# Patient Record
Sex: Female | Born: 1935 | Race: White | Hispanic: No | Smoking: Never smoker
Health system: Southern US, Community
[De-identification: ages and names within clinical notes are randomized; demographics above are authoritative.]

## PROBLEM LIST (undated history)

## (undated) DIAGNOSIS — I10 Essential (primary) hypertension: Secondary | ICD-10-CM

## (undated) DIAGNOSIS — G3184 Mild cognitive impairment, so stated: Secondary | ICD-10-CM

## (undated) DIAGNOSIS — I4819 Other persistent atrial fibrillation: Secondary | ICD-10-CM

## (undated) DIAGNOSIS — M549 Dorsalgia, unspecified: Secondary | ICD-10-CM

## (undated) DIAGNOSIS — I63239 Cerebral infarction due to unspecified occlusion or stenosis of unspecified carotid arteries: Secondary | ICD-10-CM

## (undated) DIAGNOSIS — R413 Other amnesia: Secondary | ICD-10-CM

## (undated) DIAGNOSIS — E785 Hyperlipidemia, unspecified: Secondary | ICD-10-CM

## (undated) DIAGNOSIS — G8929 Other chronic pain: Secondary | ICD-10-CM

## (undated) DIAGNOSIS — N281 Cyst of kidney, acquired: Secondary | ICD-10-CM

## (undated) DIAGNOSIS — S92919A Unspecified fracture of unspecified toe(s), initial encounter for closed fracture: Secondary | ICD-10-CM

## (undated) DIAGNOSIS — IMO0002 Reserved for concepts with insufficient information to code with codable children: Secondary | ICD-10-CM

## (undated) DIAGNOSIS — M5430 Sciatica, unspecified side: Secondary | ICD-10-CM

## (undated) DIAGNOSIS — F419 Anxiety disorder, unspecified: Secondary | ICD-10-CM

## (undated) DIAGNOSIS — I639 Cerebral infarction, unspecified: Secondary | ICD-10-CM

## (undated) HISTORY — PX: DILATION AND CURETTAGE OF UTERUS: SHX78

## (undated) HISTORY — DX: Essential (primary) hypertension: I10

## (undated) HISTORY — DX: Cerebral infarction due to unspecified occlusion or stenosis of unspecified carotid artery: I63.239

## (undated) HISTORY — PX: OVARIAN CYST REMOVAL: SHX89

## (undated) HISTORY — DX: Cyst of kidney, acquired: N28.1

## (undated) HISTORY — DX: Reserved for concepts with insufficient information to code with codable children: IMO0002

## (undated) HISTORY — DX: Mild cognitive impairment of uncertain or unknown etiology: G31.84

## (undated) HISTORY — PX: TONSILLECTOMY: SUR1361

## (undated) HISTORY — DX: Other amnesia: R41.3

## (undated) HISTORY — DX: Other persistent atrial fibrillation: I48.19

## (undated) HISTORY — DX: Unspecified fracture of unspecified toe(s), initial encounter for closed fracture: S92.919A

## (undated) HISTORY — DX: Hyperlipidemia, unspecified: E78.5

---

## 2001-02-01 ENCOUNTER — Encounter: Payer: Self-pay | Admitting: Unknown Physician Specialty

## 2001-02-01 ENCOUNTER — Encounter: Admission: RE | Admit: 2001-02-01 | Discharge: 2001-02-01 | Payer: Self-pay | Admitting: Unknown Physician Specialty

## 2002-03-13 ENCOUNTER — Encounter: Payer: Self-pay | Admitting: Unknown Physician Specialty

## 2002-03-13 ENCOUNTER — Encounter: Admission: RE | Admit: 2002-03-13 | Discharge: 2002-03-13 | Payer: Self-pay | Admitting: Unknown Physician Specialty

## 2003-03-18 ENCOUNTER — Encounter: Payer: Self-pay | Admitting: Unknown Physician Specialty

## 2003-03-18 ENCOUNTER — Encounter: Admission: RE | Admit: 2003-03-18 | Discharge: 2003-03-18 | Payer: Self-pay | Admitting: Unknown Physician Specialty

## 2003-06-17 ENCOUNTER — Other Ambulatory Visit: Admission: RE | Admit: 2003-06-17 | Discharge: 2003-06-17 | Payer: Self-pay | Admitting: Dermatology

## 2004-03-18 ENCOUNTER — Encounter: Admission: RE | Admit: 2004-03-18 | Discharge: 2004-03-18 | Payer: Self-pay | Admitting: Unknown Physician Specialty

## 2005-04-25 ENCOUNTER — Ambulatory Visit (HOSPITAL_COMMUNITY): Admission: RE | Admit: 2005-04-25 | Discharge: 2005-04-25 | Payer: Self-pay | Admitting: Unknown Physician Specialty

## 2005-05-04 ENCOUNTER — Ambulatory Visit (HOSPITAL_COMMUNITY): Admission: RE | Admit: 2005-05-04 | Discharge: 2005-05-04 | Payer: Self-pay | Admitting: Internal Medicine

## 2005-05-04 ENCOUNTER — Ambulatory Visit: Payer: Self-pay | Admitting: Internal Medicine

## 2005-05-04 ENCOUNTER — Encounter (INDEPENDENT_AMBULATORY_CARE_PROVIDER_SITE_OTHER): Payer: Self-pay | Admitting: Internal Medicine

## 2006-05-01 ENCOUNTER — Ambulatory Visit (HOSPITAL_COMMUNITY): Admission: RE | Admit: 2006-05-01 | Discharge: 2006-05-01 | Payer: Self-pay | Admitting: Unknown Physician Specialty

## 2007-05-15 ENCOUNTER — Ambulatory Visit (HOSPITAL_COMMUNITY): Admission: RE | Admit: 2007-05-15 | Discharge: 2007-05-15 | Payer: Self-pay | Admitting: Unknown Physician Specialty

## 2008-05-16 ENCOUNTER — Ambulatory Visit (HOSPITAL_COMMUNITY): Admission: RE | Admit: 2008-05-16 | Discharge: 2008-05-16 | Payer: Self-pay | Admitting: Family Medicine

## 2009-05-18 ENCOUNTER — Ambulatory Visit (HOSPITAL_COMMUNITY): Admission: RE | Admit: 2009-05-18 | Discharge: 2009-05-18 | Payer: Self-pay | Admitting: Family Medicine

## 2009-05-28 ENCOUNTER — Ambulatory Visit (HOSPITAL_COMMUNITY): Admission: RE | Admit: 2009-05-28 | Discharge: 2009-05-28 | Payer: Self-pay | Admitting: Family Medicine

## 2009-06-02 ENCOUNTER — Encounter: Payer: Self-pay | Admitting: Family Medicine

## 2009-06-02 ENCOUNTER — Inpatient Hospital Stay (HOSPITAL_COMMUNITY): Admission: EM | Admit: 2009-06-02 | Discharge: 2009-06-04 | Payer: Self-pay | Admitting: Emergency Medicine

## 2009-06-02 ENCOUNTER — Ambulatory Visit: Payer: Self-pay | Admitting: Cardiology

## 2009-06-03 ENCOUNTER — Encounter (INDEPENDENT_AMBULATORY_CARE_PROVIDER_SITE_OTHER): Payer: Self-pay | Admitting: Internal Medicine

## 2009-06-11 ENCOUNTER — Inpatient Hospital Stay (HOSPITAL_COMMUNITY): Admission: RE | Admit: 2009-06-11 | Discharge: 2009-06-13 | Payer: Self-pay | Admitting: Interventional Radiology

## 2009-06-25 ENCOUNTER — Encounter: Payer: Self-pay | Admitting: Interventional Radiology

## 2009-08-17 ENCOUNTER — Inpatient Hospital Stay (HOSPITAL_COMMUNITY): Admission: EM | Admit: 2009-08-17 | Discharge: 2009-08-18 | Payer: Self-pay | Admitting: Emergency Medicine

## 2009-09-14 ENCOUNTER — Encounter (INDEPENDENT_AMBULATORY_CARE_PROVIDER_SITE_OTHER): Payer: Self-pay | Admitting: Interventional Radiology

## 2009-09-14 ENCOUNTER — Ambulatory Visit
Admission: RE | Admit: 2009-09-14 | Discharge: 2009-09-14 | Payer: Self-pay | Source: Home / Self Care | Admitting: Interventional Radiology

## 2009-09-14 ENCOUNTER — Ambulatory Visit: Payer: Self-pay | Admitting: Surgery

## 2009-09-25 ENCOUNTER — Ambulatory Visit (HOSPITAL_COMMUNITY): Admission: RE | Admit: 2009-09-25 | Discharge: 2009-09-25 | Payer: Self-pay | Admitting: Family Medicine

## 2009-11-12 ENCOUNTER — Ambulatory Visit (HOSPITAL_COMMUNITY): Admission: RE | Admit: 2009-11-12 | Discharge: 2009-11-12 | Payer: Self-pay | Admitting: Family Medicine

## 2009-11-19 ENCOUNTER — Ambulatory Visit (HOSPITAL_COMMUNITY): Admission: RE | Admit: 2009-11-19 | Discharge: 2009-11-19 | Payer: Self-pay | Admitting: Family Medicine

## 2009-12-24 ENCOUNTER — Ambulatory Visit (HOSPITAL_COMMUNITY): Admission: RE | Admit: 2009-12-24 | Discharge: 2009-12-24 | Payer: Self-pay | Admitting: Family Medicine

## 2009-12-24 ENCOUNTER — Encounter: Payer: Self-pay | Admitting: Family Medicine

## 2009-12-29 ENCOUNTER — Ambulatory Visit: Payer: Self-pay | Admitting: Sports Medicine

## 2009-12-29 DIAGNOSIS — M25559 Pain in unspecified hip: Secondary | ICD-10-CM | POA: Insufficient documentation

## 2010-02-04 ENCOUNTER — Ambulatory Visit: Payer: Self-pay | Admitting: Sports Medicine

## 2010-04-29 ENCOUNTER — Ambulatory Visit (HOSPITAL_COMMUNITY): Admission: RE | Admit: 2010-04-29 | Discharge: 2010-04-29 | Payer: Self-pay | Admitting: Family Medicine

## 2010-06-10 ENCOUNTER — Ambulatory Visit: Payer: Self-pay | Admitting: Sports Medicine

## 2010-08-15 ENCOUNTER — Encounter: Payer: Self-pay | Admitting: Nephrology

## 2010-08-24 NOTE — Assessment & Plan Note (Signed)
Summary: F/U,MC   Vital Signs:  Patient profile:   77 year old female BP sitting:   148 / 73  Vitals Entered By: Lillia Pauls CMA (February 04, 2010 8:53 AM)  History of Present Illness: pt here today to f/u r groin/hip pain which she sts is about 80% better. sitting and any bending over still bothers her a little overall doin much better. pt has confessed to some noncompliance with her meds, gabapentin, due to the side effects she has with it. she said it makes her feel "loopy" at times.   takes gabapentin at 3 PM does make her sleepy taking only 1 per day  now walking again doing some yard work  Physical Exam  General:  Well-developed,well-nourished,in no acute distress; alert,appropriate and cooperative throughout examination Msk:  Hip ER on RT is slightly less than on left - about 30 deg IR is OK bilat goos strength on flex, abduction and adduction  no ttp ion groin today   Impression & Recommendations:  Problem # 1:  HIP PAIN, RIGHT (ICD-719.45) doing much better  prob had post cath hematoma and scar pressing on fem nerve branch  will start hip exercises for ROM  keep up 1 gabapentin  see in me 2 mos  Complete Medication List: 1)  Neurontin 300 Mg Caps (Gabapentin) .Marland Kitchen.. 1 cap by mouth at bedtime x 3 days then two times a day x 3 days then three times a day

## 2010-08-24 NOTE — Assessment & Plan Note (Signed)
Summary: FU HEMATOMA RIGHT HIP/MJD   Vital Signs:  Patient profile:   77 year old female BP sitting:   144 / 73  (right arm)  Vitals Entered By: Rochele Pages RN (June 10, 2010 9:53 AM) CC: f/u rt groin hematoma- 95% improved   CC:  f/u rt groin hematoma- 95% improved.  History of Present Illness: Patient presents to clinic for f/u of rt groin hematoma s/p cardiac catheterization 1 year ago.  She reports 95% improvement.  Only having discomfort will walking for long periods of time & going upstairs. Walking regularly - 30-60 mins most days. Doing home exercises 3-4 times per week. Denies any night-time pain. Gabapentin 1 tab daily @ 3pm is helpful & is helping her sleep better. She is very please with her progress.  Preventive Screening-Counseling & Management  Alcohol-Tobacco     Smoking Status: never  Allergies (verified): No Known Drug Allergies  Social History: Smoking Status:  never  Review of Systems      See HPI  Physical Exam  General:  Well-developed,well-nourished,in no acute distress; alert,appropriate and cooperative throughout examination Msk:  HIP: no groin pain.  R hip with slight decreased exeternal rotation, about 30-degrees compared to left, normal IR.  Normal flexion & extension.  Good strength bilaterally.  No palpable groin lump or mass.  GAIT: walking without a limp   Pulses:  +2/4 DP & PT b/l Neurologic:  sensation intact to light touch.     Impression & Recommendations:  Problem # 1:  HIP PAIN, RIGHT (ICD-719.45) Assessment Improved - Post-op cardiac cath groin hematoma with irritation of femoral nerve branch - grealty improved - Cont. gabapentin 1 tab daily in the afternoon/evening.  Plan to continue this for additional 2-3 months.  Could consider weaning at that time, although may continue long-term this is helping her sleep. - Cont. hip exercises & ROM exercises - f/u 2-3 months for re-evaluaiton, may return sooner if  needed.  Complete Medication List: 1)  Gabapentin 300 Mg Caps (Gabapentin) .Marland Kitchen.. 1 tab by mouth q hs  Patient Instructions: 1)  You are continuing to improve. 2)  Cont. all of your activities as you are doing. 3)  Cont. gabapentin 1 tab at bedtime like you are doing, we may try to taper in the future, but will re-visit this issue on follow-up. 4)  Follow-up 2-3 months for re-evaluation, may return sooner if needed. Prescriptions: GABAPENTIN 300 MG CAPS (GABAPENTIN) 1 tab by mouth q hs  #90 x 1   Entered and Authorized by:   Darene Lamer MD   Signed by:   Darene Lamer MD on 06/10/2010   Method used:   Electronically to        [facility] [address] (retail)       [address]       [place], [place]  [address]       Ph: [phone] or [phone]       Fax: [phone]   RxID:   [id number] GABAPENTIN 300 MG CAPS (GABAPENTIN) 1 tab by mouth q hs  #30 x 3   Entered and Authorized by:   Darene Lamer MD   Signed by:   Darene Lamer MD on 06/10/2010   Method used:   Print then Give to Patient   RxID:   [id number]   Appended Document: FU HEMATOMA RIGHT HIP/MJD

## 2010-08-24 NOTE — Assessment & Plan Note (Signed)
Summary: R hip pain   Vital Signs:  Patient profile:   77 year old female BP sitting:   136 / 74   History of Present Illness: 77 yo F here with right hip pain.  Patient is here with daughter. Back in November 2010 patient had catheterization procedures in R groin for angioplasty and sent placement of R middle cerebral artery. She did well with the procedures but developed pain in R groin after this procedure. She was evaluated by physicians in hospital, had ultrasound that showed no aneurysm or pseudoaneurysm. Pain has persisted and worsened since her first catheterization procedure back then. She had MRI of pelvis on 12/24/09 which showed abnormal signal intensity near obturator externus that could be a hematoma, also with glut medius tendinitis  Minimal DJD. She has been going ot PT without benefit. Pain is in R groin and radiates in anterior thigh. No numbness or tingling but thigh sometimes sore to touch. Hard for her to get comfortable. Has ultram which helps some but causes some dizziness - vicodin and percocet too strong for her.  Physical Exam  General:  Well-developed,well-nourished,in no acute distress; alert,appropriate and cooperative throughout examination Msk:  Back: No gross deformity, swelling, bruising. No midline bony TTP.  No paraspinal lumbar TTP. Negative SLRs Sensation intact to light touch BLEs Reflexes 1+ and equal in achilles and patellar tendons.  R hip: No TTP greater trochanter TTP anterior proximal thigh. Mild pain with logroll but bilateral hips with symmetric ROM - only mild limitation with IR.    Impression & Recommendations:  Problem # 1:  HIP PAIN, RIGHT (ICD-719.45) Assessment New Since catheterization and pain in groin and prox thigh concerning for nerve injury to femoral nerve by compression from bleeding or directly from catheterization.  Could not r/o lumbar radiculopathy as possible cause.  Will treat similarly with neurontin  titrating up to three times a day then f/u in 4 weeks.  Can consider addition of nortriptyline at bedtime or titration up of neurontin depending on how she is doing.  Complete Medication List: 1)  Neurontin 300 Mg Caps (Gabapentin) .Marland Kitchen.. 1 cap by mouth at bedtime x 3 days then two times a day x 3 days then three times a day  Patient Instructions: 1)  Take neurontin at bedtime x 3 days then twice a day x 3 days then ultimately three times a day. 2)  Take tylenol 2 tablets three times a day as needed for pain. 3)  You can take the tramadol in addition to this. 4)  I sent the neurontin to [facility] in [place] so it should be there when you get to the pharmacy. 5)  Follow up with Dr. Darrick Penna in 1 month for a recheck. Prescriptions: NEURONTIN 300 MG CAPS (GABAPENTIN) 1 cap by mouth at bedtime x 3 days then two times a day x 3 days then three times a day  #90 x 1   Entered and Authorized by:   Norton Blizzard MD   Signed by:   Norton Blizzard MD on 12/29/2009   Method used:   Electronically to        [facility] [address] (retail)       [address]       [place], [place]  [address]       Ph: [phone] or [phone]       Fax: [phone]   RxID:   [id number]

## 2010-09-16 ENCOUNTER — Encounter: Payer: Self-pay | Admitting: Sports Medicine

## 2010-09-16 ENCOUNTER — Ambulatory Visit (INDEPENDENT_AMBULATORY_CARE_PROVIDER_SITE_OTHER): Payer: Medicare Other | Admitting: Sports Medicine

## 2010-09-16 DIAGNOSIS — M25559 Pain in unspecified hip: Secondary | ICD-10-CM

## 2010-09-21 NOTE — Assessment & Plan Note (Signed)
Summary: F/U RT GROIN,MC   Vital Signs:  Patient profile:   77 year old female BP sitting:   150 / 70  Vitals Entered By: Lillia Pauls CMA (September 16, 2010 9:53 AM)  History of Present Illness: Hanny returns for RT groin pain This seemed to be a neuropathy P cath and hematoma she responded well to ga bapentin  now 95 % b etter on 300 hs able to walk up to 1 hour per day doing easy exercises for groin  Allergies: No Known Drug Allergies  Past History:  Past Medical History: stent in ant cerebral artery P CVA ches 2 asa q d  Physical Exam  General:  Well-developed,well-nourished,in no acute distress; alert,appropriate and cooperative throughout examination Msk:  ROM of both hips is 55 deg rotational while sitting RT now shows good abduction strength good adduction str w no pain good flexion norm sartorius test   Impression & Recommendations:  Problem # 1:  HIP PAIN, RIGHT (ICD-719.45)  Her updated medication list for this problem includes:    Aspirin 81 Mg Chew (Aspirin) .Marland Kitchen... 1 by mouth bid   This is thought to be a femoral nerve compressoin p hematoma f cath controlled w gabapentin 300 hs will cont this indefintiely  keep up walking and exercise  reck 6 mos or prn  Complete Medication List: 1)  Alprazolam 0.25 Mg Tabs (Alprazolam) .... 1/2 tab q am 2)  Lisinopril 10 Mg Tabs (Lisinopril) .Marland Kitchen.. 1 by mouth qd 3)  Neurontin 300 Mg Caps (Gabapentin) .Marland Kitchen.. 1 by mouth qhs 4)  Carafate 1 Gm Tabs (Sucralfate) .Marland Kitchen.. 1 by mouth bid 5)  Aspirin 81 Mg Chew (Aspirin) .Marland Kitchen.. 1 by mouth bid Prescriptions: NEURONTIN 300 MG CAPS (GABAPENTIN) 1 by mouth qhs  #30 x 11   Entered and Authorized by:   Enid Baas MD   Signed by:   Enid Baas MD on 09/16/2010   Method used:   Electronically to        [facility] [address] (retail)       [address]       [place], [place]  [address]       Ph: [phone] or [phone]       Fax:  [phone]   RxID:   [id number]    Orders Added: 1)  Est. Patient Level III [34742]

## 2010-10-07 ENCOUNTER — Encounter: Payer: Self-pay | Admitting: *Deleted

## 2010-10-11 LAB — URINALYSIS, ROUTINE W REFLEX MICROSCOPIC
Bilirubin Urine: NEGATIVE
Glucose, UA: NEGATIVE mg/dL
Glucose, UA: NEGATIVE mg/dL
Nitrite: POSITIVE — AB
Protein, ur: NEGATIVE mg/dL
Specific Gravity, Urine: 1.009 (ref 1.005–1.030)
Specific Gravity, Urine: 1.015 (ref 1.005–1.030)
Urobilinogen, UA: 0.2 mg/dL (ref 0.0–1.0)
pH: 6 (ref 5.0–8.0)
pH: 6 (ref 5.0–8.0)

## 2010-10-11 LAB — URINE MICROSCOPIC-ADD ON

## 2010-10-11 LAB — COMPREHENSIVE METABOLIC PANEL
AST: 26 U/L (ref 0–37)
CO2: 25 mEq/L (ref 19–32)
Calcium: 10.2 mg/dL (ref 8.4–10.5)
Chloride: 101 mEq/L (ref 96–112)
GFR calc Af Amer: >60 mL/min (ref >60)
GFR calc non Af Amer: 51 mL/min — ABNORMAL LOW (ref >60)
Glucose, Bld: 99 mg/dL (ref 70–99)
Sodium: 137 mEq/L (ref 135–145)
Total Bilirubin: 0.4 mg/dL (ref 0.3–1.2)
Total Protein: 8 g/dL (ref 6.0–8.3)

## 2010-10-11 LAB — CBC
Hemoglobin: 14.6 g/dL (ref 12.0–15.0)
MCV: 92.4 fL (ref 78.0–100.0)
Platelets: 295 10*3/uL (ref 150–400)
RBC: 4.61 MIL/uL (ref 3.87–5.11)
RDW: 13.1 % (ref 11.5–15.5)

## 2010-10-11 LAB — DIFFERENTIAL
Eosinophils Relative: 2 % (ref 0–5)
Lymphs Abs: 3.3 10*3/uL (ref 0.7–4.0)
Monocytes Absolute: 1 10*3/uL (ref 0.1–1.0)
Neutro Abs: 6.5 10*3/uL (ref 1.7–7.7)

## 2010-10-11 LAB — URINE CULTURE: Colony Count: >=100000

## 2010-10-27 LAB — PHOSPHORUS: Phosphorus: 4.1 mg/dL (ref 2.3–4.6)

## 2010-10-27 LAB — CBC
HCT: 39.7 % (ref 36.0–46.0)
HCT: 41.9 % (ref 36.0–46.0)
HCT: 42 % (ref 36.0–46.0)
HCT: 45.4 % (ref 36.0–46.0)
Hemoglobin: 13.7 g/dL (ref 12.0–15.0)
Hemoglobin: 15.7 g/dL — ABNORMAL HIGH (ref 12.0–15.0)
MCHC: 34.3 g/dL (ref 30.0–36.0)
MCHC: 34.7 g/dL (ref 30.0–36.0)
MCV: 92.7 fL (ref 78.0–100.0)
MCV: 92.8 fL (ref 78.0–100.0)
MCV: 91.9 fL (ref 78.0–100.0)
MCV: 92.6 fL (ref 78.0–100.0)
Platelets: 254 10*3/uL (ref 150–400)
Platelets: 261 10*3/uL (ref 150–400)
Platelets: 263 10*3/uL (ref 150–400)
Platelets: 312 10*3/uL (ref 150–400)
Platelets: 242 10*3/uL (ref 150–400)
Platelets: 253 10*3/uL (ref 150–400)
Platelets: 269 10*3/uL (ref 150–400)
RDW: 12.8 % (ref 11.5–15.5)
RDW: 13 % (ref 11.5–15.5)
RDW: 12.6 % (ref 11.5–15.5)
WBC: 10.4 10*3/uL (ref 4.0–10.5)
WBC: 10.7 10*3/uL — ABNORMAL HIGH (ref 4.0–10.5)
WBC: 11 10*3/uL — ABNORMAL HIGH (ref 4.0–10.5)
WBC: 10.3 10*3/uL (ref 4.0–10.5)
WBC: 9.3 10*3/uL (ref 4.0–10.5)
WBC: 9.9 10*3/uL (ref 4.0–10.5)

## 2010-10-27 LAB — DIFFERENTIAL
Basophils Absolute: 0 10*3/uL (ref 0.0–0.1)
Basophils Relative: 0 % (ref 0–1)
Basophils Relative: 0 % (ref 0–1)
Eosinophils Absolute: 0.2 10*3/uL (ref 0.0–0.7)
Eosinophils Absolute: 0.1 10*3/uL (ref 0.0–0.7)
Lymphocytes Relative: 25 % (ref 12–46)
Lymphs Abs: 3.4 10*3/uL (ref 0.7–4.0)
Lymphs Abs: 3 10*3/uL (ref 0.7–4.0)
Monocytes Relative: 9 % (ref 3–12)
Neutro Abs: 6.9 10*3/uL (ref 1.7–7.7)
Neutro Abs: 6.2 10*3/uL (ref 1.7–7.7)
Neutrophils Relative %: 58 % (ref 43–77)
Neutrophils Relative %: 65 % (ref 43–77)
Neutrophils Relative %: 60 % (ref 43–77)

## 2010-10-27 LAB — COMPREHENSIVE METABOLIC PANEL
AST: 47 U/L — ABNORMAL HIGH (ref 0–37)
AST: 52 U/L — ABNORMAL HIGH (ref 0–37)
Albumin: 3.4 g/dL — ABNORMAL LOW (ref 3.5–5.2)
Albumin: 4 g/dL (ref 3.5–5.2)
BUN: 8 mg/dL (ref 6–23)
Calcium: 9.4 mg/dL (ref 8.4–10.5)
Chloride: 104 mEq/L (ref 96–112)
Creatinine, Ser: 0.99 mg/dL (ref 0.4–1.2)
Creatinine, Ser: 1.05 mg/dL (ref 0.4–1.2)
GFR calc Af Amer: >60 mL/min (ref >60)
GFR calc Af Amer: >60 mL/min (ref >60)
Total Bilirubin: 0.6 mg/dL (ref 0.3–1.2)
Total Protein: 6.6 g/dL (ref 6.0–8.3)
Total Protein: 7.8 g/dL (ref 6.0–8.3)

## 2010-10-27 LAB — HEPARIN LEVEL (UNFRACTIONATED): Heparin Unfractionated: <0.1 IU/mL — ABNORMAL LOW (ref 0.30–0.70)

## 2010-10-27 LAB — PROTIME-INR
INR: 1.02 (ref 0.00–1.49)
INR: 1 (ref 0.00–1.49)
Prothrombin Time: 13.3 seconds (ref 11.6–15.2)
Prothrombin Time: 13.1 seconds (ref 11.6–15.2)

## 2010-10-27 LAB — BASIC METABOLIC PANEL
BUN: 12 mg/dL (ref 6–23)
BUN: 12 mg/dL (ref 6–23)
BUN: 11 mg/dL (ref 6–23)
CO2: 24 mEq/L (ref 19–32)
CO2: 23 mEq/L (ref 19–32)
Chloride: 104 mEq/L (ref 96–112)
Chloride: 108 mEq/L (ref 96–112)
Chloride: 103 mEq/L (ref 96–112)
Creatinine, Ser: 0.92 mg/dL (ref 0.4–1.2)
Creatinine, Ser: 0.98 mg/dL (ref 0.4–1.2)
GFR calc non Af Amer: 56 mL/min — ABNORMAL LOW (ref >60)
Glucose, Bld: 139 mg/dL — ABNORMAL HIGH (ref 70–99)
Potassium: 3.9 mEq/L (ref 3.5–5.1)

## 2010-10-27 LAB — GLUCOSE, CAPILLARY: Glucose-Capillary: 135 mg/dL — ABNORMAL HIGH (ref 70–99)

## 2010-10-27 LAB — CK TOTAL AND CKMB (NOT AT ARMC)
CK, MB: 2 ng/mL (ref 0.3–4.0)
Total CK: 83 U/L (ref 7–177)

## 2010-10-27 LAB — LIPID PANEL
Cholesterol: 247 mg/dL — ABNORMAL HIGH (ref 0–200)
HDL: 29 mg/dL — ABNORMAL LOW (ref >39)
LDL Cholesterol: 178 mg/dL — ABNORMAL HIGH (ref 0–99)
Total CHOL/HDL Ratio: 8.5 RATIO
Triglycerides: 202 mg/dL — ABNORMAL HIGH (ref <150)

## 2010-10-27 LAB — CREATININE, SERUM
Creatinine, Ser: 1.05 mg/dL (ref 0.4–1.2)
GFR calc non Af Amer: 51 mL/min — ABNORMAL LOW (ref >60)

## 2010-10-27 LAB — APTT: aPTT: 27 seconds (ref 24–37)

## 2010-10-27 LAB — HEMOGLOBIN A1C: Mean Plasma Glucose: 120 mg/dL

## 2010-11-25 ENCOUNTER — Ambulatory Visit (HOSPITAL_COMMUNITY)
Admission: RE | Admit: 2010-11-25 | Discharge: 2010-11-25 | Disposition: A | Discharge status: Home / Self Care | Payer: Medicare Other | Source: Ambulatory Visit | Attending: Family Medicine | Admitting: Family Medicine

## 2010-11-25 ENCOUNTER — Other Ambulatory Visit: Payer: Self-pay | Admitting: Family Medicine

## 2010-11-25 DIAGNOSIS — I10 Essential (primary) hypertension: Secondary | ICD-10-CM | POA: Insufficient documentation

## 2010-11-25 DIAGNOSIS — I639 Cerebral infarction, unspecified: Secondary | ICD-10-CM

## 2010-11-25 DIAGNOSIS — I679 Cerebrovascular disease, unspecified: Secondary | ICD-10-CM

## 2010-11-25 DIAGNOSIS — Z8673 Personal history of transient ischemic attack (TIA), and cerebral infarction without residual deficits: Secondary | ICD-10-CM | POA: Insufficient documentation

## 2010-11-25 DIAGNOSIS — R0989 Other specified symptoms and signs involving the circulatory and respiratory systems: Secondary | ICD-10-CM | POA: Insufficient documentation

## 2010-12-10 NOTE — Op Note (Signed)
NAME:  Becky Baird, Becky Baird               ACCOUNT NO.:  [id number]   MEDICAL RECORD NO.:  [id number]          PATIENT TYPE:  AMB   LOCATION:  DAY                           FACILITY:  [facility]   PHYSICIAN:  Lionel December, M.D.    DATE OF BIRTH:  29-Mar-1934   DATE OF PROCEDURE:  05/04/2005  DATE OF DISCHARGE:                                 OPERATIVE REPORT   PROCEDURE:  Colonoscopy.   INDICATIONS:  Becky Baird is a 77 year old Caucasian female who has had multiple  polyps removed before who is here for surveillance colonoscopy. Family  history is negative for colorectal carcinoma. Procedure and risks were  reviewed with the patient and informed consent was obtained.   PREOPERATIVE MEDICATIONS:  Demerol 25 mg IV, Versed 4 mg IV.   FINDINGS:  Procedure performed in endoscopy suite. The patient's vital signs  and O2 saturations were monitored during procedure and remained stable. The  patient was placed in the left lateral position and rectal examination  performed. No abnormality noted on external or digital exam. Olympus video  scope was placed in the rectum and advanced under vision into sigmoid colon  and beyond. Preparation was satisfactory. She had tiny scattered diverticula  throughout the colon. Most of these were in the sigmoid colon. Scope was  passed into cecum which was identified by appendiceal orifice/stump and  ileocecal valve. Short segment of TI was also examined and was normal. There  were two polyps. One was involving the blunt end of cecum. This was ablated  by via cold biopsy. Second polyp ablated in a similar fashion from proximal  transverse colon. Mucosa of the rest of the colon was normal. The rectal  mucosa similarly was normal. Scope was retroflexed to examine anorectal  junction, and small hemorrhoids were noted below the dentate line. The scope  was straightened and withdrawn. The patient tolerated the procedure well.   FINAL DIAGNOSIS:  1.  Two small polyps ablated via  cold biopsy, one from cecum, another one      from transverse colon.  2.  Few tiny diverticula scattered throughout the colon.  3.  Small external hemorrhoids.   RECOMMENDATIONS:  1.  Standard instructions given.  2.  High-fiber diet.  3.  I will be contacting patient with biopsy results. Given her history, she      should return for repeat exam in five years from now.      Lionel December, M.D.  Electronically Signed     NR/MEDQ  D:  05/04/2005  T:  05/04/2005  Job:  [id number]   cc:   Lorin Picket A. Gerda Diss, MD  Fax: [phone]

## 2011-02-24 ENCOUNTER — Telehealth (INDEPENDENT_AMBULATORY_CARE_PROVIDER_SITE_OTHER): Payer: Self-pay | Admitting: *Deleted

## 2011-02-24 NOTE — Telephone Encounter (Signed)
Becky Baird is sch'd for [facility] 8/16 for surveillance, hx polyps  Her daughter Becky Baird wants to know if Becky Kathan can have Osmo prep instead of Movi prep  Please advise    If ok this will need to be escribed, I have put rx in and pended it for you to sign if you are ok with patient taking Osmo prep, if ok I need to know so I can send new prep instruction sheet..thanks

## 2011-02-28 MED ORDER — SOD PHOS MONO-SOD PHOS DIBASIC 1.102-0.398 G PO TABS: 1.0000 | ORAL_TABLET | Freq: Once | ORAL | Status: DC

## 2011-02-28 NOTE — Telephone Encounter (Signed)
Pt daughter Barkley Boards made aware of Dr Patty Sermons recommnedation of it being ok for Ms Basham to switch to osmo prep

## 2011-02-28 NOTE — Telephone Encounter (Signed)
Okay to use osmo prep.

## 2011-03-09 MED ORDER — SODIUM CHLORIDE 0.45 % IV SOLN
Freq: Once | INTRAVENOUS | Status: AC
  Administered 2011-03-10: 11:00:00 via INTRAVENOUS

## 2011-03-10 ENCOUNTER — Encounter (INDEPENDENT_AMBULATORY_CARE_PROVIDER_SITE_OTHER): Payer: Medicare Other | Admitting: Internal Medicine

## 2011-03-10 ENCOUNTER — Other Ambulatory Visit (INDEPENDENT_AMBULATORY_CARE_PROVIDER_SITE_OTHER): Payer: Self-pay | Admitting: Internal Medicine

## 2011-03-10 ENCOUNTER — Ambulatory Visit (HOSPITAL_COMMUNITY)
Admission: RE | Admit: 2011-03-10 | Discharge: 2011-03-10 | Disposition: A | Discharge status: Home / Self Care | Payer: Medicare Other | Source: Ambulatory Visit | Attending: Internal Medicine | Admitting: Internal Medicine

## 2011-03-10 ENCOUNTER — Encounter (HOSPITAL_COMMUNITY): Payer: Self-pay | Admitting: *Deleted

## 2011-03-10 ENCOUNTER — Encounter (HOSPITAL_COMMUNITY)
Admission: RE | Disposition: A | Discharge status: Home / Self Care | Payer: Self-pay | Source: Ambulatory Visit | Attending: Internal Medicine

## 2011-03-10 DIAGNOSIS — Z860100 Personal history of colon polyps, unspecified: Secondary | ICD-10-CM | POA: Insufficient documentation

## 2011-03-10 DIAGNOSIS — D126 Benign neoplasm of colon, unspecified: Secondary | ICD-10-CM | POA: Insufficient documentation

## 2011-03-10 DIAGNOSIS — I10 Essential (primary) hypertension: Secondary | ICD-10-CM | POA: Insufficient documentation

## 2011-03-10 DIAGNOSIS — Z09 Encounter for follow-up examination after completed treatment for conditions other than malignant neoplasm: Secondary | ICD-10-CM | POA: Insufficient documentation

## 2011-03-10 DIAGNOSIS — Z7982 Long term (current) use of aspirin: Secondary | ICD-10-CM | POA: Insufficient documentation

## 2011-03-10 DIAGNOSIS — K5730 Diverticulosis of large intestine without perforation or abscess without bleeding: Secondary | ICD-10-CM

## 2011-03-10 DIAGNOSIS — Z79899 Other long term (current) drug therapy: Secondary | ICD-10-CM | POA: Insufficient documentation

## 2011-03-10 HISTORY — DX: Anxiety disorder, unspecified: F41.9

## 2011-03-10 HISTORY — DX: Cerebral infarction, unspecified: I63.9

## 2011-03-10 HISTORY — PX: COLONOSCOPY: SHX5424

## 2011-03-10 SURGERY — COLONOSCOPY
Anesthesia: Moderate Sedation

## 2011-03-10 MED ORDER — MEPERIDINE HCL 50 MG/ML IJ SOLN
INTRAMUSCULAR | Status: AC
  Filled 2011-03-10: qty 1

## 2011-03-10 MED ORDER — MIDAZOLAM HCL 5 MG/5ML IJ SOLN
INTRAMUSCULAR | Status: AC
  Filled 2011-03-10: qty 5

## 2011-03-10 NOTE — H&P (Signed)
Becky Baird is an 77 y.o. female.   Chief Complaint: History of colonic polyps; here for colonoscopy. HPI: Patient is 77 year old Caucasian female who has had multiple colonic adenomas removed previously; last exam was 5 years ago. She denies abdominal pain rectal bleeding or change in her bowel habits. One family history is negative for colorectal carcinoma a younger brother also has colonic polyps.  Past Medical History  Diagnosis Date  . Stroke     2010  . Anxiety     Past Surgical History  Procedure Date  . Tonsillectomy     childhood  . Dilation and curettage of uterus     years ago  . Ovarian cyst removal     at least 10 years ago    History reviewed. No pertinent family history. Social History:  reports that she has never smoked. She does not have any smokeless tobacco history on file. She reports that she does not drink alcohol or use illicit drugs.  Allergies: No Known Allergies  Medications Prior to Admission  Medication Dose Route Frequency Provider Last Rate Last Dose  . 0.45 % sodium chloride infusion   Intravenous Once Malissa Hippo, MD 20 mL/hr at 03/10/11 1032    . meperidine (DEMEROL) 50 MG/ML injection           . midazolam (VERSED) 5 MG/5ML injection            Medications Prior to Admission  Medication Sig Dispense Refill  . ALPRAZolam (XANAX) 0.25 MG tablet 1/2 tab Q am       . aspirin 81 MG chewable tablet Chew 81 mg by mouth 2 (two) times daily.        Marland Kitchen gabapentin (NEURONTIN) 300 MG capsule Take 300 mg by mouth at bedtime.        Marland Kitchen lisinopril (PRINIVIL,ZESTRIL) 10 MG tablet Take 10 mg by mouth daily.        . sucralfate (CARAFATE) 1 G tablet Take 1 g by mouth 2 (two) times daily.          No results found for this or any previous visit (from the past 48 hour(s)). No results found.  Review of Systems  Constitutional: Negative for weight loss.  Gastrointestinal: Negative for abdominal pain, diarrhea, constipation, blood in stool and melena.     Blood pressure 162/58, pulse 59, temperature 97.6 F (36.4 C), temperature source Oral, resp. rate 18, height 5' 7.5" (1.715 m), weight 159 lb (72.122 kg), SpO2 98.00%. Physical Exam  Constitutional: She appears well-developed and well-nourished.  HENT:  Mouth/Throat: Oropharynx is clear and moist.  Eyes: Conjunctivae are normal. No scleral icterus.  Neck: No thyromegaly present.  Cardiovascular: Normal rate, regular rhythm and normal heart sounds.   No murmur heard. Respiratory: Breath sounds normal.  GI: Soft. She exhibits no distension and no mass. There is no tenderness.  Musculoskeletal: She exhibits no edema.  Lymphadenopathy:    She has no cervical adenopathy.  Neurological: She is alert.  Skin: Skin is warm and dry.     Assessment/Plan History of colonic polyps. For surveillance colonoscopy.  Becky,NAJEEB Baird 03/10/2011, 11:18 AM

## 2011-03-10 NOTE — Op Note (Signed)
COLONOSCOPY PROCEDURE REPORT  PATIENT:  ICESS Baird  MR#:  [id number] Birthdate:  April 28, 1934, 77 y.o., female Endoscopist:  Dr. Malissa Hippo, MD Referred By:  Dr. Lilyan Punt MD Procedure Date: 03/10/2011  Procedure:   Colonoscopy  Indications:  History of colonic polyps. Patient has had multiple adenomas removed over the last 20 years or so. 2 small adenomas removed on her most recent exam of October 2006.  Informed Consent:  Procedure and risks were reviewed with the patient and informed consent was obtained. Medications:  No sedation was administered per patient request    Description of procedure:  After a digital rectal exam was performed, that colonoscope was advanced from the anus through the rectum and colon to the area of the cecum, ileocecal valve and appendiceal orifice. The cecum was deeply intubated. These structures were well-seen and photographed for the record. From the level of the cecum and ileocecal valve, the scope was slowly and cautiously withdrawn. The mucosal surfaces were carefully surveyed utilizing scope tip to flexion to facilitate fold flattening as needed. The scope was pulled down into the rectum where a thorough exam including retroflexion was performed.  Findings:   Prep satisfactory except she had some formed stool in the ascending colon and cecum. Irregular shaped sessile polyp at cecum was biopsied for histology and restless ablated with APC; this polyp could not be elevated with saline injection.  5 to 6 mm polyp at sigmoid colon was snared. Scattered diverticula at the sigmoid colon. Small external hemorrhoids. Therapeutic/Diagnostic Maneuvers Performed:  See above  Complications:  None  Cecal Withdrawal Time:  13 minutes  Impression:  Irregular shaped sessile polyp at cecum; it  was biopsied for histology and rest of the polyp ablated with APC. 5-6 mm polyp snared from sigmoid colon. Mild sigmoid t diverticulosis. External  hemorrhoids.  Recommendations:  Resume aspirin on 03/13/2011. Resume other medications as before. High-fiber diet. Physician will contact you with biopsy results.  Monroe Qin U  03/10/2011 11:56 AM  CC: Dr.      Salena Saner

## 2011-03-15 ENCOUNTER — Encounter (INDEPENDENT_AMBULATORY_CARE_PROVIDER_SITE_OTHER): Payer: Self-pay | Admitting: *Deleted

## 2011-03-17 ENCOUNTER — Encounter (HOSPITAL_COMMUNITY): Payer: Self-pay | Admitting: Internal Medicine

## 2011-03-21 ENCOUNTER — Other Ambulatory Visit (INDEPENDENT_AMBULATORY_CARE_PROVIDER_SITE_OTHER): Payer: Self-pay | Admitting: *Deleted

## 2011-03-21 DIAGNOSIS — K219 Gastro-esophageal reflux disease without esophagitis: Secondary | ICD-10-CM

## 2011-03-21 DIAGNOSIS — K2960 Other gastritis without bleeding: Secondary | ICD-10-CM

## 2011-03-21 MED ORDER — SUCRALFATE 1 G PO TABS: 1.0000 g | ORAL_TABLET | Freq: Four times a day (QID) | ORAL | Status: DC

## 2011-03-21 NOTE — Telephone Encounter (Signed)
Prescription last filled 01/20/2011

## 2011-04-21 ENCOUNTER — Other Ambulatory Visit: Payer: Self-pay | Admitting: Family Medicine

## 2011-04-21 DIAGNOSIS — Z139 Encounter for screening, unspecified: Secondary | ICD-10-CM

## 2011-05-26 ENCOUNTER — Ambulatory Visit (HOSPITAL_COMMUNITY)
Admission: RE | Admit: 2011-05-26 | Discharge: 2011-05-26 | Disposition: A | Discharge status: Home / Self Care | Payer: Medicare Other | Source: Ambulatory Visit | Attending: Family Medicine | Admitting: Family Medicine

## 2011-05-26 DIAGNOSIS — Z139 Encounter for screening, unspecified: Secondary | ICD-10-CM

## 2011-05-26 DIAGNOSIS — Z1231 Encounter for screening mammogram for malignant neoplasm of breast: Secondary | ICD-10-CM | POA: Insufficient documentation

## 2011-09-24 ENCOUNTER — Other Ambulatory Visit: Payer: Self-pay | Admitting: Sports Medicine

## 2011-10-03 DIAGNOSIS — Z0189 Encounter for other specified special examinations: Secondary | ICD-10-CM | POA: Diagnosis not present

## 2011-10-03 DIAGNOSIS — Z79899 Other long term (current) drug therapy: Secondary | ICD-10-CM | POA: Diagnosis not present

## 2011-10-03 DIAGNOSIS — E782 Mixed hyperlipidemia: Secondary | ICD-10-CM | POA: Diagnosis not present

## 2011-10-03 DIAGNOSIS — R7301 Impaired fasting glucose: Secondary | ICD-10-CM | POA: Diagnosis not present

## 2011-10-04 ENCOUNTER — Other Ambulatory Visit: Payer: Self-pay | Admitting: Sports Medicine

## 2011-10-06 DIAGNOSIS — G3184 Mild cognitive impairment, so stated: Secondary | ICD-10-CM | POA: Diagnosis not present

## 2011-10-06 DIAGNOSIS — R413 Other amnesia: Secondary | ICD-10-CM | POA: Diagnosis not present

## 2011-10-07 DIAGNOSIS — I10 Essential (primary) hypertension: Secondary | ICD-10-CM | POA: Diagnosis not present

## 2011-10-07 DIAGNOSIS — E785 Hyperlipidemia, unspecified: Secondary | ICD-10-CM | POA: Diagnosis not present

## 2011-10-07 DIAGNOSIS — R7309 Other abnormal glucose: Secondary | ICD-10-CM | POA: Diagnosis not present

## 2012-01-02 DIAGNOSIS — L255 Unspecified contact dermatitis due to plants, except food: Secondary | ICD-10-CM | POA: Diagnosis not present

## 2012-01-12 DIAGNOSIS — I63239 Cerebral infarction due to unspecified occlusion or stenosis of unspecified carotid arteries: Secondary | ICD-10-CM | POA: Diagnosis not present

## 2012-01-12 DIAGNOSIS — G3184 Mild cognitive impairment, so stated: Secondary | ICD-10-CM | POA: Diagnosis not present

## 2012-01-12 DIAGNOSIS — R413 Other amnesia: Secondary | ICD-10-CM | POA: Diagnosis not present

## 2012-01-12 DIAGNOSIS — I10 Essential (primary) hypertension: Secondary | ICD-10-CM | POA: Diagnosis not present

## 2012-03-02 ENCOUNTER — Other Ambulatory Visit: Payer: Self-pay | Admitting: Sports Medicine

## 2012-04-13 DIAGNOSIS — Z23 Encounter for immunization: Secondary | ICD-10-CM | POA: Diagnosis not present

## 2012-04-17 DIAGNOSIS — Z79899 Other long term (current) drug therapy: Secondary | ICD-10-CM | POA: Diagnosis not present

## 2012-04-17 DIAGNOSIS — E782 Mixed hyperlipidemia: Secondary | ICD-10-CM | POA: Diagnosis not present

## 2012-04-17 DIAGNOSIS — E119 Type 2 diabetes mellitus without complications: Secondary | ICD-10-CM | POA: Diagnosis not present

## 2012-04-24 ENCOUNTER — Other Ambulatory Visit: Payer: Self-pay | Admitting: Family Medicine

## 2012-04-24 DIAGNOSIS — E785 Hyperlipidemia, unspecified: Secondary | ICD-10-CM | POA: Diagnosis not present

## 2012-04-24 DIAGNOSIS — I10 Essential (primary) hypertension: Secondary | ICD-10-CM | POA: Diagnosis not present

## 2012-04-24 DIAGNOSIS — I639 Cerebral infarction, unspecified: Secondary | ICD-10-CM

## 2012-04-24 DIAGNOSIS — I6789 Other cerebrovascular disease: Secondary | ICD-10-CM | POA: Diagnosis not present

## 2012-04-26 ENCOUNTER — Other Ambulatory Visit: Payer: Self-pay | Admitting: Family Medicine

## 2012-04-26 ENCOUNTER — Other Ambulatory Visit (HOSPITAL_COMMUNITY): Payer: Self-pay | Admitting: Unknown Physician Specialty

## 2012-04-26 DIAGNOSIS — Z139 Encounter for screening, unspecified: Secondary | ICD-10-CM

## 2012-05-01 ENCOUNTER — Ambulatory Visit (HOSPITAL_COMMUNITY): Payer: Medicare Other

## 2012-05-15 ENCOUNTER — Ambulatory Visit (HOSPITAL_COMMUNITY)
Admission: RE | Admit: 2012-05-15 | Discharge: 2012-05-15 | Disposition: A | Discharge status: Home / Self Care | Payer: Medicare Other | Source: Ambulatory Visit | Attending: Family Medicine | Admitting: Family Medicine

## 2012-05-15 DIAGNOSIS — I658 Occlusion and stenosis of other precerebral arteries: Secondary | ICD-10-CM | POA: Diagnosis not present

## 2012-05-15 DIAGNOSIS — I6350 Cerebral infarction due to unspecified occlusion or stenosis of unspecified cerebral artery: Secondary | ICD-10-CM | POA: Insufficient documentation

## 2012-05-15 DIAGNOSIS — I639 Cerebral infarction, unspecified: Secondary | ICD-10-CM

## 2012-05-29 ENCOUNTER — Ambulatory Visit (HOSPITAL_COMMUNITY)
Admission: RE | Admit: 2012-05-29 | Discharge: 2012-05-29 | Disposition: A | Discharge status: Home / Self Care | Payer: Medicare Other | Source: Ambulatory Visit | Attending: Unknown Physician Specialty | Admitting: Unknown Physician Specialty

## 2012-05-29 DIAGNOSIS — Z139 Encounter for screening, unspecified: Secondary | ICD-10-CM

## 2012-05-29 DIAGNOSIS — Z1231 Encounter for screening mammogram for malignant neoplasm of breast: Secondary | ICD-10-CM | POA: Insufficient documentation

## 2012-06-11 DIAGNOSIS — N390 Urinary tract infection, site not specified: Secondary | ICD-10-CM | POA: Diagnosis not present

## 2012-06-11 DIAGNOSIS — R300 Dysuria: Secondary | ICD-10-CM | POA: Diagnosis not present

## 2012-07-03 DIAGNOSIS — N390 Urinary tract infection, site not specified: Secondary | ICD-10-CM | POA: Diagnosis not present

## 2012-07-05 DIAGNOSIS — G3184 Mild cognitive impairment, so stated: Secondary | ICD-10-CM | POA: Diagnosis not present

## 2012-07-05 DIAGNOSIS — I63239 Cerebral infarction due to unspecified occlusion or stenosis of unspecified carotid arteries: Secondary | ICD-10-CM | POA: Diagnosis not present

## 2012-07-27 DIAGNOSIS — R7309 Other abnormal glucose: Secondary | ICD-10-CM | POA: Diagnosis not present

## 2012-07-27 DIAGNOSIS — E785 Hyperlipidemia, unspecified: Secondary | ICD-10-CM | POA: Diagnosis not present

## 2012-07-27 DIAGNOSIS — I10 Essential (primary) hypertension: Secondary | ICD-10-CM | POA: Diagnosis not present

## 2012-09-24 ENCOUNTER — Other Ambulatory Visit: Payer: Self-pay | Admitting: Sports Medicine

## 2012-09-26 ENCOUNTER — Other Ambulatory Visit: Payer: Self-pay | Admitting: *Deleted

## 2012-09-26 MED ORDER — GABAPENTIN 300 MG PO CAPS: 300.0000 mg | ORAL_CAPSULE | Freq: Every day | ORAL | Status: DC

## 2012-10-04 ENCOUNTER — Other Ambulatory Visit: Payer: Self-pay | Admitting: Neurology

## 2012-10-04 DIAGNOSIS — I63239 Cerebral infarction due to unspecified occlusion or stenosis of unspecified carotid arteries: Secondary | ICD-10-CM

## 2012-10-09 ENCOUNTER — Other Ambulatory Visit: Payer: Self-pay | Admitting: Family Medicine

## 2012-10-09 DIAGNOSIS — Z79899 Other long term (current) drug therapy: Secondary | ICD-10-CM | POA: Diagnosis not present

## 2012-10-09 DIAGNOSIS — E782 Mixed hyperlipidemia: Secondary | ICD-10-CM | POA: Diagnosis not present

## 2012-10-09 DIAGNOSIS — R7301 Impaired fasting glucose: Secondary | ICD-10-CM | POA: Diagnosis not present

## 2012-10-09 LAB — CBC WITH DIFFERENTIAL/PLATELET
Eosinophils Absolute: 0.2 10*3/uL (ref 0.0–0.7)
Eosinophils Relative: 3 % (ref 0–5)
Hemoglobin: 13.6 g/dL (ref 12.0–15.0)
Lymphs Abs: 2.3 10*3/uL (ref 0.7–4.0)
MCH: 30.1 pg (ref 26.0–34.0)
MCV: 86.5 fL (ref 78.0–100.0)
Monocytes Relative: 9 % (ref 3–12)
Platelets: 259 10*3/uL (ref 150–400)
RBC: 4.52 MIL/uL (ref 3.87–5.11)

## 2012-10-09 LAB — HEMOGLOBIN A1C
Hgb A1c MFr Bld: 5.8 % — ABNORMAL HIGH (ref <5.7)
Mean Plasma Glucose: 120 mg/dL — ABNORMAL HIGH (ref <117)

## 2012-10-09 LAB — BASIC METABOLIC PANEL
BUN: 22 mg/dL (ref 6–23)
CO2: 29 mEq/L (ref 19–32)
Calcium: 10.2 mg/dL (ref 8.4–10.5)
Creat: 1.26 mg/dL — ABNORMAL HIGH (ref 0.50–1.10)
Glucose, Bld: 96 mg/dL (ref 70–99)

## 2012-10-11 NOTE — Progress Notes (Signed)
Please tell patient her labs (cholesterol and kidney test looks good. Keep all as is. Follow up on regular basis.

## 2012-10-12 ENCOUNTER — Telehealth: Payer: Self-pay

## 2012-10-12 ENCOUNTER — Encounter: Payer: Self-pay | Admitting: *Deleted

## 2012-10-12 DIAGNOSIS — E785 Hyperlipidemia, unspecified: Secondary | ICD-10-CM | POA: Insufficient documentation

## 2012-10-12 DIAGNOSIS — I10 Essential (primary) hypertension: Secondary | ICD-10-CM | POA: Insufficient documentation

## 2012-10-12 DIAGNOSIS — I639 Cerebral infarction, unspecified: Secondary | ICD-10-CM | POA: Insufficient documentation

## 2012-10-12 DIAGNOSIS — IMO0002 Reserved for concepts with insufficient information to code with codable children: Secondary | ICD-10-CM | POA: Insufficient documentation

## 2012-10-12 NOTE — Telephone Encounter (Signed)
Spoke to patient advised per Dr. Lilyan Punt that her labs (cholesterol and kidney test looks good. Keep all as is. Follow up on regular basis.

## 2012-10-19 ENCOUNTER — Encounter: Payer: Self-pay | Admitting: Family Medicine

## 2012-10-19 ENCOUNTER — Ambulatory Visit (INDEPENDENT_AMBULATORY_CARE_PROVIDER_SITE_OTHER): Payer: Medicare Other | Admitting: Family Medicine

## 2012-10-19 VITALS — BP 130/70 | HR 70 | Wt 160.0 lb

## 2012-10-19 DIAGNOSIS — I6350 Cerebral infarction due to unspecified occlusion or stenosis of unspecified cerebral artery: Secondary | ICD-10-CM | POA: Diagnosis not present

## 2012-10-19 DIAGNOSIS — D230 Other benign neoplasm of skin of lip: Secondary | ICD-10-CM | POA: Diagnosis not present

## 2012-10-19 DIAGNOSIS — E785 Hyperlipidemia, unspecified: Secondary | ICD-10-CM

## 2012-10-19 DIAGNOSIS — K2970 Gastritis, unspecified, without bleeding: Secondary | ICD-10-CM | POA: Diagnosis not present

## 2012-10-19 DIAGNOSIS — D1801 Hemangioma of skin and subcutaneous tissue: Secondary | ICD-10-CM | POA: Diagnosis not present

## 2012-10-19 DIAGNOSIS — I639 Cerebral infarction, unspecified: Secondary | ICD-10-CM

## 2012-10-19 DIAGNOSIS — D239 Other benign neoplasm of skin, unspecified: Secondary | ICD-10-CM | POA: Diagnosis not present

## 2012-10-19 DIAGNOSIS — I10 Essential (primary) hypertension: Secondary | ICD-10-CM | POA: Diagnosis not present

## 2012-10-19 DIAGNOSIS — K2990 Gastroduodenitis, unspecified, without bleeding: Secondary | ICD-10-CM | POA: Diagnosis not present

## 2012-10-19 DIAGNOSIS — L821 Other seborrheic keratosis: Secondary | ICD-10-CM | POA: Diagnosis not present

## 2012-10-19 DIAGNOSIS — L723 Sebaceous cyst: Secondary | ICD-10-CM | POA: Diagnosis not present

## 2012-10-19 MED ORDER — LISINOPRIL 10 MG PO TABS: 10.0000 mg | ORAL_TABLET | Freq: Every day | ORAL | Status: DC

## 2012-10-19 MED ORDER — PANTOPRAZOLE SODIUM 40 MG PO TBEC: 40.0000 mg | DELAYED_RELEASE_TABLET | Freq: Every day | ORAL | Status: DC

## 2012-10-19 NOTE — Patient Instructions (Signed)
Low starch diet  Take your meds  Blood pressure check in 3 months  Labs and visit in 6 months

## 2012-10-19 NOTE — Progress Notes (Signed)
  Subjective:    Patient ID: Becky Baird, female    DOB: March 07, 1934, 79 y.o.   MRN: [id number]  Hypertension This is a chronic problem. The current episode started more than 1 year ago. The problem is unchanged. The problem is controlled. Pertinent negatives include no anxiety, blurred vision, chest pain, headaches, malaise/fatigue, orthopnea or palpitations. There are no associated agents to hypertension. Risk factors for coronary artery disease include dyslipidemia and family history. Past treatments include ACE inhibitors. The current treatment provides significant improvement. There are no compliance problems.  There is no history of angina or kidney disease. There is no history of chronic renal disease.  Hyperlipidemia This is a chronic problem. The current episode started more than 1 year ago. The problem is controlled. Recent lipid tests were reviewed and are high (slighly up). She has no history of chronic renal disease, diabetes or hypothyroidism. There are no known factors aggravating her hyperlipidemia. Pertinent negatives include no chest pain, focal weakness or leg pain. Treatments tried: use red rice yeast. The current treatment provides moderate improvement of lipids. There are no compliance problems.  There are no known risk factors for coronary artery disease.      Review of Systems  Constitutional: Negative for fever, chills, malaise/fatigue, activity change, appetite change and fatigue.  HENT: Negative.   Eyes: Negative for blurred vision.  Respiratory: Negative.  Negative for choking and chest tightness.   Cardiovascular: Negative.  Negative for chest pain, palpitations and orthopnea.  Gastrointestinal: Negative.  Negative for abdominal pain and abdominal distention.  Endocrine: Negative for cold intolerance and polyphagia.  Neurological: Negative for focal weakness and headaches.       Objective:   Physical Exam        Assessment & Plan:  Hypertension - Plan:  lisinopril (PRINIVIL,ZESTRIL) 10 MG tablet  CVA (cerebral infarction)  Hyperlipidemia  Unspecified gastritis and gastroduodenitis without mention of hemorrhage - Plan: pantoprazole (PROTONIX) 40 MG tablet

## 2012-11-22 ENCOUNTER — Encounter: Payer: Self-pay | Admitting: Family Medicine

## 2012-11-22 ENCOUNTER — Ambulatory Visit (INDEPENDENT_AMBULATORY_CARE_PROVIDER_SITE_OTHER): Payer: Medicare Other | Admitting: Family Medicine

## 2012-11-22 VITALS — BP 114/68 | Temp 97.6°F | Ht 65.0 in | Wt 150.4 lb

## 2012-11-22 DIAGNOSIS — A088 Other specified intestinal infections: Secondary | ICD-10-CM | POA: Diagnosis not present

## 2012-11-22 DIAGNOSIS — A084 Viral intestinal infection, unspecified: Secondary | ICD-10-CM

## 2012-11-22 MED ORDER — ONDANSETRON HCL 8 MG PO TABS: 8.0000 mg | ORAL_TABLET | Freq: Three times a day (TID) | ORAL | Status: DC | PRN

## 2012-11-22 MED ORDER — DIPHENOXYLATE-ATROPINE 2.5-0.025 MG PO TABS: 1.0000 | ORAL_TABLET | Freq: Four times a day (QID) | ORAL | Status: DC | PRN

## 2012-11-22 NOTE — Progress Notes (Signed)
  Subjective:    Patient ID: EUGINA ROW, female    DOB: August 14, 1933, 79 y.o.   MRN: [id number]  Diarrhea  This is a new problem. The current episode started in the past 7 days. The problem occurs 5 to 10 times per day. The problem has been unchanged. The stool consistency is described as watery. The patient states that diarrhea awakens her from sleep. Associated symptoms include abdominal pain. Nothing aggravates the symptoms. There are no known risk factors. She has tried nothing for the symptoms. The treatment provided no relief.   Past medical history benign   Review of Systems  Gastrointestinal: Positive for abdominal pain and diarrhea.  No fevers chills sweats no recent travel no chest discomfort or shortness of breath no passing out     Objective:   Physical Exam Eardrums normal throat is normal makes membranes moist lungs are clear no crackles heart regular abdomen soft subjective soreness but no tenderness oral guarding or rebound       Assessment & Plan:  Abdominal discomfort with diarrhea-Lomotil 4 times a day when necessary Zofran as needed 3 times a day when necessary, if diarrhea doesn't check up over the next several days may need intervention but at this point in time not necessary. No blood work indicated no x-rays indicated. Call us if ongoing troubles or bloody stools high fever worse call us immediately. In the dictation

## 2012-11-22 NOTE — Patient Instructions (Signed)
Bland diet today: applesauce, rice, jello, soft fruits, bread, cereal ,diluted gatorade ( 2 parts and 1 part water)  If fevers/ bloody stools/ or not better by Monday call

## 2012-12-10 ENCOUNTER — Encounter: Payer: Self-pay | Admitting: Family Medicine

## 2012-12-10 ENCOUNTER — Ambulatory Visit (INDEPENDENT_AMBULATORY_CARE_PROVIDER_SITE_OTHER): Payer: Medicare Other | Admitting: Family Medicine

## 2012-12-10 VITALS — BP 128/80 | Temp 98.5°F | Ht 67.5 in | Wt 158.0 lb

## 2012-12-10 DIAGNOSIS — B9689 Other specified bacterial agents as the cause of diseases classified elsewhere: Secondary | ICD-10-CM

## 2012-12-10 DIAGNOSIS — N760 Acute vaginitis: Secondary | ICD-10-CM | POA: Diagnosis not present

## 2012-12-10 DIAGNOSIS — R300 Dysuria: Secondary | ICD-10-CM | POA: Diagnosis not present

## 2012-12-10 DIAGNOSIS — A499 Bacterial infection, unspecified: Secondary | ICD-10-CM

## 2012-12-10 LAB — POCT URINALYSIS DIPSTICK

## 2012-12-10 MED ORDER — AMOXICILLIN 500 MG PO CAPS: 500.0000 mg | ORAL_CAPSULE | Freq: Three times a day (TID) | ORAL | Status: DC

## 2012-12-10 MED ORDER — METRONIDAZOLE 500 MG PO TABS: 500.0000 mg | ORAL_TABLET | Freq: Two times a day (BID) | ORAL | Status: DC

## 2012-12-10 NOTE — Progress Notes (Signed)
  Subjective:    Patient ID: Becky Baird, female    DOB: [birth year], 79 y.o.   MRN: [id number]  Urinary Tract Infection  This is a new problem. The current episode started 1 to 4 weeks ago. The problem occurs every urination. The problem has been unchanged. The quality of the pain is described as burning. The pain is at a severity of 9/10. The pain is moderate. There has been no fever. Treatments tried: miconazole nitrate cream. The treatment provided mild relief.  she tried cream first for possible yeast infection     Review of Systems    See above denies fever chills and vomiting or abdominal pain Objective:   Physical Exam  Lungs clear hearts regular pulse normal abdomen soft no guarding round or rebound or tenderness vaginal exam mucoid drainage noted wet prep shows many WBCs      Assessment & Plan:  #1 bacterial vaginitis-amoxicillin 3 times a day for 7 days plus Flagyl twice a day for 7 days. If not having success with this notify us. No sign of UTI.

## 2012-12-10 NOTE — Progress Notes (Signed)
  Subjective:    Patient ID: Becky Baird, female    DOB: 1934-06-11, 79 y.o.   MRN: [id number]  HPI    Review of Systems     Objective:   Physical Exam        Assessment & Plan:

## 2012-12-14 ENCOUNTER — Telehealth: Payer: Self-pay | Admitting: Family Medicine

## 2012-12-14 NOTE — Telephone Encounter (Signed)
The previous message was accidentally on the wrong patient. Please disregard Adderall note. As for Becky Baird, she was prescribed Flagyl twice daily for bacterial vaginitis. Her urine looked good. There was no need to do a urine culture. She should clear up on this medicine.

## 2012-12-14 NOTE — Telephone Encounter (Signed)
POCT urinalysis dipstick (Order [id number]) <*> DOS 12/10/12  Message: Patient is wanting to know about the results, to make sure she is on the correct medication.

## 2012-12-14 NOTE — Telephone Encounter (Signed)
Confused by message below. Is this the correct patient?

## 2012-12-14 NOTE — Telephone Encounter (Signed)
Write script , adderall plain 5 mg  # 10, Gma to give 1/2 each am and give [place] phone call in 1 week to see if any problems/ follow up ov in 2 weeks

## 2012-12-18 ENCOUNTER — Telehealth: Payer: Self-pay | Admitting: Family Medicine

## 2012-12-18 NOTE — Telephone Encounter (Signed)
Note printed and patient contacted to pick up.  Thanks

## 2012-12-18 NOTE — Telephone Encounter (Signed)
Needs a Doctors note that she was sick from Monday 12/10/12 until Friday 12/14/12.  Needs note to state she would have been unable to get to court.  Please call patient when note is ready.

## 2012-12-18 NOTE — Telephone Encounter (Signed)
Pt was out all week last week and missed court, may she have a note for this?

## 2012-12-18 NOTE — Telephone Encounter (Signed)
Gave results to pt, states last pill taken Sunday night and feels much better.

## 2012-12-18 NOTE — Telephone Encounter (Signed)
Sure ,please give

## 2012-12-20 ENCOUNTER — Other Ambulatory Visit: Payer: Medicare Other

## 2012-12-20 ENCOUNTER — Ambulatory Visit: Payer: Medicare Other | Admitting: Neurology

## 2012-12-25 DIAGNOSIS — N760 Acute vaginitis: Secondary | ICD-10-CM | POA: Diagnosis not present

## 2012-12-25 DIAGNOSIS — I10 Essential (primary) hypertension: Secondary | ICD-10-CM | POA: Diagnosis not present

## 2012-12-25 DIAGNOSIS — Z01419 Encounter for gynecological examination (general) (routine) without abnormal findings: Secondary | ICD-10-CM | POA: Diagnosis not present

## 2012-12-25 DIAGNOSIS — K219 Gastro-esophageal reflux disease without esophagitis: Secondary | ICD-10-CM | POA: Diagnosis not present

## 2012-12-25 DIAGNOSIS — E785 Hyperlipidemia, unspecified: Secondary | ICD-10-CM | POA: Diagnosis not present

## 2012-12-25 DIAGNOSIS — I6350 Cerebral infarction due to unspecified occlusion or stenosis of unspecified cerebral artery: Secondary | ICD-10-CM | POA: Diagnosis not present

## 2013-01-03 ENCOUNTER — Encounter: Payer: Self-pay | Admitting: Neurology

## 2013-01-03 DIAGNOSIS — R413 Other amnesia: Secondary | ICD-10-CM

## 2013-01-03 DIAGNOSIS — F09 Unspecified mental disorder due to known physiological condition: Secondary | ICD-10-CM | POA: Insufficient documentation

## 2013-01-03 DIAGNOSIS — G3184 Mild cognitive impairment, so stated: Secondary | ICD-10-CM

## 2013-01-04 ENCOUNTER — Encounter: Payer: Self-pay | Admitting: Neurology

## 2013-01-04 ENCOUNTER — Ambulatory Visit (INDEPENDENT_AMBULATORY_CARE_PROVIDER_SITE_OTHER): Payer: Medicare Other | Admitting: Neurology

## 2013-01-04 ENCOUNTER — Ambulatory Visit (INDEPENDENT_AMBULATORY_CARE_PROVIDER_SITE_OTHER): Payer: Medicare Other

## 2013-01-04 VITALS — BP 158/63 | HR 52 | Temp 97.1°F | Resp 18 | Ht 67.0 in

## 2013-01-04 DIAGNOSIS — I63239 Cerebral infarction due to unspecified occlusion or stenosis of unspecified carotid arteries: Secondary | ICD-10-CM

## 2013-01-04 DIAGNOSIS — Z0289 Encounter for other administrative examinations: Secondary | ICD-10-CM

## 2013-01-04 DIAGNOSIS — I6990 Unspecified sequelae of unspecified cerebrovascular disease: Secondary | ICD-10-CM

## 2013-01-04 NOTE — Patient Instructions (Signed)
Return in 6 months for follow up visit. Continue current medications.

## 2013-01-04 NOTE — Progress Notes (Signed)
[facility]  PATIENT: Becky Baird DOB: 10/28/1933   HISTORY FROM: patient, daughter, chart REASON FOR VISIT: routine follow up, carotid doppler, TCD.  HISTORY OF PRESENT ILLNESS:  Becky Baird is a 79 year old Caucasian female with history of  RMCA branch infarct on Nov 2010 with RICA stenosis s/p randomization into SAMMPRIS trial with PTA stent 06/11/09.  01/04/13 Here for follow up visit with repeat carotid dopplers and TCD.  Patient reports she is doing well, exercising regularly and working in the yard often including mowing her lawn.  Reports some balance instability but no falls. No new neurological complaints.  Feels that short term memory has improved.  States that recent cholesterol lab work was good and BP is running 120-130/60-70 at home.  Patient denies medication side effects, with no signs of bleeding.    REVIEW OF SYSTEMS: Full 14 system review of systems performed and notable only for: nothing.  ALLERGIES: Allergies  Allergen Reactions  . Niaspan (Niacin Er)     rash    HOME MEDICATIONS: Outpatient Prescriptions Prior to Visit  Medication Sig Dispense Refill  . ALPRAZolam (XANAX) 0.25 MG tablet 0.5 mg. 1/2 tab Q am      . aspirin 81 MG tablet Take 81 mg by mouth. 2 every day      . Cholecalciferol (VITAMIN D) 2000 UNITS CAPS Take by mouth daily.      . fish oil-omega-3 fatty acids 1000 MG capsule Take 2 g by mouth. Two twice a day      . gabapentin (NEURONTIN) 300 MG capsule Take 1 capsule (300 mg total) by mouth at bedtime.  30 capsule  8  . lisinopril (PRINIVIL,ZESTRIL) 10 MG tablet Take 1 tablet (10 mg total) by mouth daily.  30 tablet  12  . loratadine (CLARITIN) 10 MG tablet Take 10 mg by mouth daily.      . pantoprazole (PROTONIX) 40 MG tablet Take 1 tablet (40 mg total) by mouth daily.  30 tablet  12  . Red Yeast Rice Extract (RED YEAST RICE PO) Take by mouth. Two twice a day      . sodium chloride (OCEAN) 0.65 % nasal spray  Place 1 spray into the nose as needed for congestion.      Marland Kitchen amoxicillin (AMOXIL) 500 MG capsule Take 1 capsule (500 mg total) by mouth 3 (three) times daily.  21 capsule  0  . diphenoxylate-atropine (LOMOTIL) 2.5-0.025 MG per tablet Take 1 tablet by mouth 4 (four) times daily as needed for diarrhea or loose stools.  30 tablet  0  . metroNIDAZOLE (FLAGYL) 500 MG tablet Take 1 tablet (500 mg total) by mouth 2 (two) times daily with a meal.  14 tablet  0  . ondansetron (ZOFRAN) 8 MG tablet Take 1 tablet (8 mg total) by mouth every 8 (eight) hours as needed for nausea.  20 tablet  0   No facility-administered medications prior to visit.    PAST MEDICAL HISTORY: Past Medical History  Diagnosis Date  . Stroke     2010  . Anxiety   . Hypertension   . Hyperlipidemia   . Renal cyst   . Ulcer   . Stroke   . Occlusion and stenosis of carotid artery with cerebral infarction   . Mild cognitive impairment, so stated   . Memory loss   . Other generalized ischemic cerebrovascular disease     PAST SURGICAL HISTORY: Past Surgical History  Procedure Laterality Date  .  Tonsillectomy      childhood  . Dilation and curettage of uterus      years ago  . Ovarian cyst removal      at least 10 years ago  . Colonoscopy  03/10/2011    Procedure: COLONOSCOPY;  Surgeon: Malissa Hippo, MD;  Location: [facility];  Service: Endoscopy;  Laterality: N/A;  . Brain stent      FAMILY HISTORY: Family History  Problem Relation Age of Onset  . Diabetes Brother     SOCIAL HISTORY: History   Social History  . Marital Status: Widowed    Spouse Name: N/A    Number of Children: N/A  . Years of Education: N/A   Occupational History  . Not on file.   Social History Main Topics  . Smoking status: Never Smoker   . Smokeless tobacco: Not on file  . Alcohol Use: No  . Drug Use: No  . Sexually Active: Not on file   Other Topics Concern  . Not on file   Social History Narrative  . No narrative on  file     PHYSICAL EXAM  Filed Vitals:   01/04/13 1331 01/04/13 1456  BP: 158/63 158/63  Pulse: 52 52  Temp: 97.1 F (36.2 C) 97.1 F (36.2 C)  TempSrc: Oral   Resp: 18   Height: 5\' 7"  (1.702 m)    There is no weight on file to calculate BMI.  GENERAL EXAM: Patient is in no distress, well developed and well groomed. HEAD: Symmetric facial features. EARS, NOSE, and THROAT: Normal.  NECK: Supple, no JVD RESPIRATORY: Lungs CTA. CARDIOVASCULAR: Regular rate and rhythm, 3/6 systolic murmur radiates to clavicle, no carotid bruits SKIN: No rash, no bruising  NEUROLOGIC: MENTAL STATUS: awake, alert and oriented to person, place and time, language MILD DYSFLUENCY, comprehension intact, naming intact CRANIAL NERVE: pupils equal and reactive to light, visual fields full to confrontation, extraocular muscles intact, no nystagmus, facial sensation and strength symmetric, uvula midline, shoulder shrug symmetric, tongue midline.  MOTOR: normal bulk and tone, full strength in the BUE, BLE, decreased  fine finger movements on left SENSORY: normal and symmetric to light touch, pinprick, temperature, vibration  COORDINATION: finger-nose-finger normal, heel-shin normal REFLEXES: deep tendon reflexes present and symmetric 2+,  GAIT/STATION: narrow based gait; able to walk on toes, heels and tandem; romberg is negative. No assistive device.   DIAGNOSTIC DATA (LABS, IMAGING, TESTING) - I reviewed patient records, labs, notes, testing and imaging myself where available.  Lab Results  Component Value Date   WBC 6.4 10/09/2012   HGB 13.6 10/09/2012   HCT 39.1 10/09/2012   MCV 86.5 10/09/2012   PLT 259 10/09/2012      Component Value Date/Time   NA 140 10/09/2012 1141   K 4.9 10/09/2012 1141   CL 104 10/09/2012 1141   CO2 29 10/09/2012 1141   GLUCOSE 96 10/09/2012 1141   BUN 22 10/09/2012 1141   CREATININE 1.26* 10/09/2012 1141   CREATININE 1.06 08/17/2009 1902   CALCIUM 10.2 10/09/2012 1141   PROT  8.0 08/17/2009 1902   ALBUMIN 4.4 08/17/2009 1902   AST 26 08/17/2009 1902   ALT 22 08/17/2009 1902   ALKPHOS 73 08/17/2009 1902   BILITOT 0.4 08/17/2009 1902   GFRNONAA 51* 08/17/2009 1902   GFRAA  Value: >60        The eGFR has been calculated using the MDRD equation. This calculation has not been validated in all clinical situations. eGFR's persistently <  60 mL/min signify possible Chronic Kidney Disease. 08/17/2009 1902   Lab Results  Component Value Date   CHOL 168 10/09/2012   HDL 46 10/09/2012   LDLCALC 102* 10/09/2012   TRIG 101 10/09/2012   CHOLHDL 3.7 10/09/2012   Lab Results  Component Value Date   HGBA1C 5.8* 10/09/2012   No results found for this basename: VITAMINB12    MRI, MRA of the brain on June 02, 2009 that showed a moderately large acute infarct in the right tempoparietal cortex with small amount of associated hemorrhage.  An MRA that showed severe intracranial atherosclerotic disease.  The patient had a cerebral arteriogram on November 10 that showed a 90% distal right MCA stenosis, a 75% stenosis of the right vertebrobasilar junction, a 70-75% stenosis of the left PCA 65-70% stenosis of the right PCA and a 50% stenosis of the left MCA.  MR BRAIN WO CONTRAST 08/17/09:  No acute infarction is demonstrated in this patient status post endovascular stent assisted angioplasty of a symptomatic right MCA distal M1 segment stenosis. The degree of residual stenosis is not established with this study.  Chronic right posterior temporoparietal infarct with gliosis, encephalomalacia, and subacute blood products.  US CAROTID DOPPLER 05/15/12:  Less than 50% stenosis in the right and left internal carotid arteries.  US CAROTID DOPPLER 01/04/13: pending  [place] TRANSCRANIAL DOPPLER 01/04/13: pending   ASSESSMENT AND PLAN Becky Baird is a 79 year old Caucasian female with history of  R MCA branch infarct on Nov 2010 with R ICA stenosis s/p randomization into SAMMPRIS trial with PTA stent  06/11/09.  Continue aspirin 81 mg orally every day  for secondary stroke prevention and maintain strict control of hypertension with blood pressure goal below 130/90, diabetes with hemoglobin A1c goal below 6.5% and lipids with LDL cholesterol goal below 100 mg/dL.  Continue healthy diet and regular exercise, goal 30 minutes 4-5 days per week. Lipid panel to be checked every 6 months. Followup in the future with me in 6 months.   Becky LAM NP-C 01/04/2013, 2:57 PM  [facility] [address], [address] [place], [place] [address] [phone]  I have personally examined this patient, reviewed pertinent data, developed plan of care and discussed with patient and agree with above.  Delia Heady, MD

## 2013-01-10 ENCOUNTER — Telehealth: Payer: Self-pay | Admitting: *Deleted

## 2013-01-10 NOTE — Telephone Encounter (Signed)
Becky Baird, daughter is calling for results fo dopplers done on the 01-04-13

## 2013-01-10 NOTE — Telephone Encounter (Signed)
I called and LMVM for daughter I do not have results yet.

## 2013-01-11 ENCOUNTER — Telehealth: Payer: Self-pay | Admitting: Family Medicine

## 2013-01-11 ENCOUNTER — Other Ambulatory Visit: Payer: Self-pay

## 2013-01-11 DIAGNOSIS — E782 Mixed hyperlipidemia: Secondary | ICD-10-CM

## 2013-01-11 DIAGNOSIS — Z79899 Other long term (current) drug therapy: Secondary | ICD-10-CM

## 2013-01-11 DIAGNOSIS — R7301 Impaired fasting glucose: Secondary | ICD-10-CM

## 2013-01-11 DIAGNOSIS — R5381 Other malaise: Secondary | ICD-10-CM

## 2013-01-11 NOTE — Telephone Encounter (Signed)
Blood work ordered per protocol. Sent to front desk to be mailed.

## 2013-01-11 NOTE — Telephone Encounter (Signed)
Patient would like BW mailed to her

## 2013-01-22 ENCOUNTER — Telehealth: Payer: Self-pay | Admitting: *Deleted

## 2013-01-24 NOTE — Telephone Encounter (Signed)
After consulting Dr. Pearlean Brownie, results of dopplers (carotid and TCD) left on Becky Baird's VM.  She is to call back if questions.

## 2013-01-28 NOTE — Telephone Encounter (Signed)
I called and LMVM for pts daughter, Rosey Bath and gave her the results of dopplers.   She is to call back if questions.

## 2013-02-08 DIAGNOSIS — E782 Mixed hyperlipidemia: Secondary | ICD-10-CM | POA: Diagnosis not present

## 2013-02-08 DIAGNOSIS — R7301 Impaired fasting glucose: Secondary | ICD-10-CM | POA: Diagnosis not present

## 2013-02-08 DIAGNOSIS — R5381 Other malaise: Secondary | ICD-10-CM | POA: Diagnosis not present

## 2013-02-08 DIAGNOSIS — Z79899 Other long term (current) drug therapy: Secondary | ICD-10-CM | POA: Diagnosis not present

## 2013-02-08 LAB — HEPATIC FUNCTION PANEL
Alkaline Phosphatase: 96 U/L (ref 39–117)
Indirect Bilirubin: 0.5 mg/dL (ref 0.0–0.9)
Total Protein: 7.3 g/dL (ref 6.0–8.3)

## 2013-02-08 LAB — LIPID PANEL
HDL: 42 mg/dL (ref >39)
Total CHOL/HDL Ratio: 3.8 Ratio
Triglycerides: 97 mg/dL (ref <150)

## 2013-02-08 LAB — CBC WITH DIFFERENTIAL/PLATELET
Basophils Absolute: 0 10*3/uL (ref 0.0–0.1)
Eosinophils Absolute: 0.2 10*3/uL (ref 0.0–0.7)
Lymphocytes Relative: 31 % (ref 12–46)
Lymphs Abs: 1.9 10*3/uL (ref 0.7–4.0)
MCH: 30.2 pg (ref 26.0–34.0)
Neutrophils Relative %: 54 % (ref 43–77)
Platelets: 263 10*3/uL (ref 150–400)
RBC: 4.07 MIL/uL (ref 3.87–5.11)
RDW: 13.4 % (ref 11.5–15.5)
WBC: 6 10*3/uL (ref 4.0–10.5)

## 2013-02-08 LAB — BASIC METABOLIC PANEL
CO2: 28 mEq/L (ref 19–32)
Calcium: 10.1 mg/dL (ref 8.4–10.5)
Sodium: 142 mEq/L (ref 135–145)

## 2013-02-08 LAB — HEMOGLOBIN A1C: Mean Plasma Glucose: 114 mg/dL (ref <117)

## 2013-02-15 ENCOUNTER — Ambulatory Visit (INDEPENDENT_AMBULATORY_CARE_PROVIDER_SITE_OTHER): Payer: Medicare Other | Admitting: Family Medicine

## 2013-02-15 ENCOUNTER — Encounter: Payer: Self-pay | Admitting: Family Medicine

## 2013-02-15 ENCOUNTER — Telehealth: Payer: Self-pay | Admitting: Family Medicine

## 2013-02-15 VITALS — BP 126/72 | Wt 154.4 lb

## 2013-02-15 DIAGNOSIS — I10 Essential (primary) hypertension: Secondary | ICD-10-CM

## 2013-02-15 DIAGNOSIS — E785 Hyperlipidemia, unspecified: Secondary | ICD-10-CM | POA: Diagnosis not present

## 2013-02-15 DIAGNOSIS — N289 Disorder of kidney and ureter, unspecified: Secondary | ICD-10-CM

## 2013-02-15 LAB — BASIC METABOLIC PANEL
BUN: 21 mg/dL (ref 6–23)
CO2: 29 mEq/L (ref 19–32)
Chloride: 103 mEq/L (ref 96–112)
Glucose, Bld: 89 mg/dL (ref 70–99)
Potassium: 4.7 mEq/L (ref 3.5–5.3)

## 2013-02-15 MED ORDER — ALPRAZOLAM 0.5 MG PO TABS: 0.2500 mg | ORAL_TABLET | Freq: Two times a day (BID) | ORAL | Status: DC | PRN

## 2013-02-15 NOTE — Telephone Encounter (Signed)
done

## 2013-02-15 NOTE — Telephone Encounter (Signed)
Patient needs RX for xanax, she will pick up.

## 2013-02-15 NOTE — Patient Instructions (Signed)
DASH Diet  The DASH diet stands for "Dietary Approaches to Stop Hypertension." It is a healthy eating plan that has been shown to reduce high blood pressure (hypertension) in as little as 14 days, while also possibly providing other significant health benefits. These other health benefits include reducing the risk of breast cancer after menopause and reducing the risk of type 2 diabetes, heart disease, colon cancer, and stroke. Health benefits also include weight loss and slowing kidney failure in patients with chronic kidney disease.   DIET GUIDELINES  · Limit salt (sodium). Your diet should contain less than 1500 mg of sodium daily.  · Limit refined or processed carbohydrates. Your diet should include mostly whole grains. Desserts and added sugars should be used sparingly.  · Include small amounts of heart-healthy fats. These types of fats include nuts, oils, and tub margarine. Limit saturated and trans fats. These fats have been shown to be harmful in the body.  CHOOSING FOODS   The following food groups are based on a 2000 calorie diet. See your Registered Dietitian for individual calorie needs.  Grains and Grain Products (6 to 8 servings daily)  · Eat More Often: Whole-wheat bread, brown rice, whole-grain or wheat pasta, quinoa, popcorn without added fat or salt (air popped).  · Eat Less Often: White bread, white pasta, white rice, cornbread.  Vegetables (4 to 5 servings daily)  · Eat More Often: Fresh, frozen, and canned vegetables. Vegetables may be raw, steamed, roasted, or grilled with a minimal amount of fat.  · Eat Less Often/Avoid: Creamed or fried vegetables. Vegetables in a cheese sauce.  Fruit (4 to 5 servings daily)  · Eat More Often: All fresh, canned (in natural juice), or frozen fruits. Dried fruits without added sugar. One hundred percent fruit juice (½ cup [237 mL] daily).  · Eat Less Often: Dried fruits with added sugar. Canned fruit in light or heavy syrup.  Lean Meats, Fish, and Poultry (2  servings or less daily. One serving is 3 to 4 oz [85-114 g]).  · Eat More Often: Ninety percent or leaner ground beef, tenderloin, sirloin. Round cuts of beef, chicken breast, turkey breast. All fish. Grill, bake, or broil your meat. Nothing should be fried.  · Eat Less Often/Avoid: Fatty cuts of meat, turkey, or chicken leg, thigh, or wing. Fried cuts of meat or fish.  Dairy (2 to 3 servings)  · Eat More Often: Low-fat or fat-free milk, low-fat plain or light yogurt, reduced-fat or part-skim cheese.  · Eat Less Often/Avoid: Milk (whole, 2%). Whole milk yogurt. Full-fat cheeses.  Nuts, Seeds, and Legumes (4 to 5 servings per week)  · Eat More Often: All without added salt.  · Eat Less Often/Avoid: Salted nuts and seeds, canned beans with added salt.  Fats and Sweets (limited)  · Eat More Often: Vegetable oils, tub margarines without trans fats, sugar-free gelatin. Mayonnaise and salad dressings.  · Eat Less Often/Avoid: Coconut oils, palm oils, butter, stick margarine, cream, half and half, cookies, candy, pie.  FOR MORE INFORMATION  The Dash Diet Eating Plan: www.dashdiet.org  Document Released: 06/30/2011 Document Revised: 10/03/2011 Document Reviewed: 06/30/2011  ExitCare® Patient Information ©2014 [employer].

## 2013-02-15 NOTE — Progress Notes (Signed)
  Subjective:    Patient ID: Becky Baird, female    DOB: 20-Mar-1934, 79 y.o.   MRN: [id number]  Hypertension This is a chronic problem. The current episode started more than 1 year ago. The problem is controlled.   This patient denies any chest pain shortness of breath denies any headaches unilateral numbness or weakness. She does take her medicine as directed. She does get in a fair amount of fluids. She also stays active she cuts her own grass does her own yard work shopping. She states her memory overall is doing well. Family member with her no particular complaints. We went overall of her labs including elevated creatinine. This seems to be a trend. There is no significant family history of kidney issues nor does the patient have significant history of kidney issues. Past medical history stroke hypertension hyperlipidemia Family history noncontributory Social does not smoke   Review of Systems    denies headaches. Denies joint pain swelling in the legs. Denies hematuria hematochezia. Objective:   Physical Exam Lungs are clear no crackles heart is regular. Pulse normal blood pressure good abdomen soft extremities no edema skin warm dry       Assessment & Plan:  #1 hyperlipidemia overall decent control continue current measures Hypertension good control continue current measures Renal insufficiency recheck metabolic 7 may end up needing 13-YQMV urine creatinine and 24-hour urine protein. Await the results. Followup 3 months.

## 2013-02-19 DIAGNOSIS — N289 Disorder of kidney and ureter, unspecified: Secondary | ICD-10-CM | POA: Diagnosis not present

## 2013-02-19 NOTE — Addendum Note (Signed)
Addended by: Metro Kung on: 02/19/2013 01:33 PM   Modules accepted: Orders

## 2013-02-22 DIAGNOSIS — N289 Disorder of kidney and ureter, unspecified: Secondary | ICD-10-CM | POA: Diagnosis not present

## 2013-02-23 LAB — CREATININE CLEARANCE, URINE, 24 HOUR
Creatinine Clearance: 49 mL/min — ABNORMAL LOW (ref 75–115)
Creatinine, 24H Ur: 1042 mg/d (ref 700–1800)

## 2013-02-23 LAB — PROTEIN, URINE, 24 HOUR: Protein, Urine: 6 mg/dL

## 2013-03-04 ENCOUNTER — Telehealth: Payer: Self-pay | Admitting: Family Medicine

## 2013-03-04 NOTE — Telephone Encounter (Signed)
Patient would like referral initiated to see the [facility] doctors, either Dr Arrie Aran, [facility] or [place]. She cannot be seen the week of 04/08/13 due to transportation and a M T or W is preferred.

## 2013-03-05 ENCOUNTER — Other Ambulatory Visit: Payer: Self-pay | Admitting: Family Medicine

## 2013-03-05 DIAGNOSIS — N289 Disorder of kidney and ureter, unspecified: Secondary | ICD-10-CM

## 2013-03-05 NOTE — Telephone Encounter (Signed)
Referral has been set in motion. Our office will notify when we hear from kidney specialist when they can see her

## 2013-03-05 NOTE — Telephone Encounter (Signed)
Notified patient referral has been set in motion. Our office will notify when we hear from kidney specialist when they can see her. Patient verbalized understanding.

## 2013-03-08 ENCOUNTER — Telehealth: Payer: Self-pay | Admitting: Family Medicine

## 2013-03-08 NOTE — Telephone Encounter (Signed)
Patient would like a copy of Recent Lab Work, Protein Urine 24 hour results.  Please mail these to patient.  Patient states she has already signed a release for this information  Please call Patient. Thanks

## 2013-03-15 ENCOUNTER — Telehealth: Payer: Self-pay | Admitting: Family Medicine

## 2013-03-15 ENCOUNTER — Other Ambulatory Visit: Payer: Self-pay | Admitting: *Deleted

## 2013-03-15 DIAGNOSIS — Z79899 Other long term (current) drug therapy: Secondary | ICD-10-CM

## 2013-03-15 DIAGNOSIS — I10 Essential (primary) hypertension: Secondary | ICD-10-CM

## 2013-03-15 MED ORDER — AMLODIPINE BESYLATE 5 MG PO TABS: 5.0000 mg | ORAL_TABLET | Freq: Every day | ORAL | Status: DC

## 2013-03-15 NOTE — Telephone Encounter (Signed)
bw papers please an mail to pt's home address

## 2013-03-15 NOTE — Telephone Encounter (Signed)
bw papers mailed.

## 2013-03-15 NOTE — Telephone Encounter (Signed)
Discussed with pt. Pt requested bloodwork and urine test. Copy mailed along with bloodwork papers for a met 7 to do 2 weeks after stopping lisinopril. Amlodipine sent to [facility] and follow up appt made with Dr. Lorin Picket.

## 2013-03-15 NOTE — Telephone Encounter (Signed)
Nurses, Please inform the patient that the nephrologist will get to her as soon as they can. The nephrologist recommend that she stop her lisinopril. Instead start amlodipine 5 mg once daily, #30-1 daily-4 refills. I recommend patient followup with [place] in approximately 2-4 weeks to recheck her blood pressure. I also recommend rechecking metabolic 2 weeks after stopping the lisinopril. Enid Derry FYI)

## 2013-03-15 NOTE — Telephone Encounter (Signed)
Ronda with [facility] called to say that the patient's referral has been received, records reviewed, rated a 3 and they will get pt in as soon as they can.  Their doc recommended considering stopping pt's Lisinopril to try to help with kidney functions.  Please advise

## 2013-03-21 ENCOUNTER — Other Ambulatory Visit: Payer: Self-pay

## 2013-03-21 ENCOUNTER — Telehealth: Payer: Self-pay | Admitting: Family Medicine

## 2013-03-21 DIAGNOSIS — E782 Mixed hyperlipidemia: Secondary | ICD-10-CM

## 2013-03-21 DIAGNOSIS — Z79899 Other long term (current) drug therapy: Secondary | ICD-10-CM

## 2013-03-21 NOTE — Telephone Encounter (Signed)
Patient would like BW paperwork. She would like it mailed to her today so we can have the results before her visit.

## 2013-03-21 NOTE — Telephone Encounter (Signed)
Blood work ordered per protocol. Left message on voicemail notifying patient that papers were mailed to her today.

## 2013-03-28 DIAGNOSIS — Z79899 Other long term (current) drug therapy: Secondary | ICD-10-CM | POA: Diagnosis not present

## 2013-03-28 DIAGNOSIS — E782 Mixed hyperlipidemia: Secondary | ICD-10-CM | POA: Diagnosis not present

## 2013-03-28 LAB — LIPID PANEL
HDL: 46 mg/dL (ref >39)
LDL Cholesterol: 91 mg/dL (ref 0–99)
Total CHOL/HDL Ratio: 3.3 Ratio
VLDL: 16 mg/dL (ref 0–40)

## 2013-03-28 LAB — HEPATIC FUNCTION PANEL
Alkaline Phosphatase: 87 U/L (ref 39–117)
Bilirubin, Direct: 0.1 mg/dL (ref 0.0–0.3)
Indirect Bilirubin: 0.4 mg/dL (ref 0.0–0.9)
Total Bilirubin: 0.5 mg/dL (ref 0.3–1.2)

## 2013-03-28 LAB — BASIC METABOLIC PANEL
Calcium: 10 mg/dL (ref 8.4–10.5)
Sodium: 141 mEq/L (ref 135–145)

## 2013-03-29 ENCOUNTER — Ambulatory Visit: Payer: Medicare Other | Admitting: Family Medicine

## 2013-04-01 ENCOUNTER — Encounter: Payer: Self-pay | Admitting: Family Medicine

## 2013-04-01 ENCOUNTER — Ambulatory Visit (INDEPENDENT_AMBULATORY_CARE_PROVIDER_SITE_OTHER): Payer: Medicare Other | Admitting: Family Medicine

## 2013-04-01 VITALS — BP 132/82 | Ht 67.5 in | Wt 154.2 lb

## 2013-04-01 DIAGNOSIS — I10 Essential (primary) hypertension: Secondary | ICD-10-CM | POA: Diagnosis not present

## 2013-04-01 NOTE — Progress Notes (Signed)
  Subjective:    Patient ID: Becky Baird, female    DOB: July 03, 1934, 79 y.o.   MRN: [id number]  HPI  Patient arrives for a follow up on blood pressure. Started Norvasc at last visit. Problems with feet swelling.  Review of Systems     Objective:   Physical Exam        Assessment & Plan:

## 2013-04-01 NOTE — Progress Notes (Signed)
  Subjective:    Patient ID: Becky Baird, female    DOB: [birth year], 79 y.o.   MRN: [id number]  HPI Patient presents today regarding her blood pressure. She is taking amlodipine but unfortunately had swelling in her legs associated with this especially on several half days. She denies any other particular troubles no shortness of breath or wheezing. Her blood pressure typically 142/82 she is scheduled to see nephrology but unfortunately they have not given her an official appointment we will followup on that Renal insufficiency hypertension Recently we stopped lisinopril heard bites of nephrology if creatinine has not improved   Review of Systems See above    Objective:   Physical Exam Lungs clear heart regular pulse normal extremities trace edema       Assessment & Plan:  #1 HTN decent control continue current medication #2 pedal edema use knee-high support hose as this should help Patient will followup with nephrology we will followup on try to make sure the appointment gets set

## 2013-04-03 DIAGNOSIS — Z23 Encounter for immunization: Secondary | ICD-10-CM | POA: Diagnosis not present

## 2013-04-08 ENCOUNTER — Telehealth: Payer: Self-pay | Admitting: Family Medicine

## 2013-04-08 ENCOUNTER — Encounter: Payer: Self-pay | Admitting: Family Medicine

## 2013-04-08 ENCOUNTER — Ambulatory Visit (INDEPENDENT_AMBULATORY_CARE_PROVIDER_SITE_OTHER): Payer: Medicare Other | Admitting: Family Medicine

## 2013-04-08 VITALS — BP 144/68 | Ht 67.5 in | Wt 155.0 lb

## 2013-04-08 DIAGNOSIS — N289 Disorder of kidney and ureter, unspecified: Secondary | ICD-10-CM

## 2013-04-08 DIAGNOSIS — I10 Essential (primary) hypertension: Secondary | ICD-10-CM | POA: Diagnosis not present

## 2013-04-08 MED ORDER — POTASSIUM CHLORIDE ER 10 MEQ PO TBCR: 10.0000 meq | EXTENDED_RELEASE_TABLET | Freq: Two times a day (BID) | ORAL | Status: DC

## 2013-04-08 MED ORDER — HYDROCHLOROTHIAZIDE 25 MG PO TABS: 25.0000 mg | ORAL_TABLET | Freq: Every day | ORAL | Status: DC

## 2013-04-08 NOTE — Progress Notes (Signed)
  Subjective:    Patient ID: Becky Baird, female    DOB: [birth year], 79 y.o.   MRN: [id number]  HPI Bilateral leg pain and swelling. Muscle in right leg is hurting.  Patient states this is a side effect of her blood pressure medication. She states the med is not  Controlling her blood pressure. Face started getting red and itchy yesterday.   This kind patient relates that she has severe swelling in the legs by the end of the day this started ever since she's been on amlodipine. Now she is having a red rash itches very little. No other known in fatigue problems going on.  Denies fever chills sweats nausea vomiting or shortness of breath PMH stroke hypertension with renal insufficiency   Review of Systems See above    Objective:   Physical Exam Lungs are clear hearts regular pulse normal there is a reddened rash on the lower legs no swelling noted.       Assessment & Plan:  Red rash-the problem going on here is more so that there is swelling in the legs which I think is cause some leakage of the blood vessels which cause the redness staining. This rash does not blanch. I do not feel it is allergy. It should go away over the next 10 days  Hypertension stop amlodipine due to side effects I recommend using hydrochlorothiazide along with potassium plus recheck metabolic 7 and 10 days recheck blood pressure in a few weeks bring blood pressure monitor cuff with her. Call us sooner if any problems

## 2013-04-08 NOTE — Telephone Encounter (Signed)
Becky Baird is confused when she is supposed to take the potasium, if you could call to clarify with the pt she would greatly appreciate it .

## 2013-04-08 NOTE — Telephone Encounter (Signed)
She is to take the diuretic and a potassium tablet each morning one of each.

## 2013-04-09 NOTE — Telephone Encounter (Signed)
[facility] 9/16

## 2013-04-09 NOTE — Telephone Encounter (Signed)
Discussed with patient. Patient verbalized understanding. 

## 2013-04-18 DIAGNOSIS — N289 Disorder of kidney and ureter, unspecified: Secondary | ICD-10-CM | POA: Diagnosis not present

## 2013-04-18 DIAGNOSIS — I10 Essential (primary) hypertension: Secondary | ICD-10-CM | POA: Diagnosis not present

## 2013-04-18 LAB — BASIC METABOLIC PANEL
BUN: 16 mg/dL (ref 6–23)
CO2: 32 mEq/L (ref 19–32)
Chloride: 101 mEq/L (ref 96–112)
Creat: 1.4 mg/dL — ABNORMAL HIGH (ref 0.50–1.10)
Potassium: 4.3 mEq/L (ref 3.5–5.3)

## 2013-04-20 ENCOUNTER — Other Ambulatory Visit: Payer: Self-pay | Admitting: Family Medicine

## 2013-04-22 ENCOUNTER — Encounter: Payer: Self-pay | Admitting: Family Medicine

## 2013-04-22 ENCOUNTER — Ambulatory Visit (INDEPENDENT_AMBULATORY_CARE_PROVIDER_SITE_OTHER): Payer: Medicare Other | Admitting: Family Medicine

## 2013-04-22 VITALS — BP 130/70 | Ht 67.5 in | Wt 154.0 lb

## 2013-04-22 DIAGNOSIS — I10 Essential (primary) hypertension: Secondary | ICD-10-CM | POA: Diagnosis not present

## 2013-04-22 DIAGNOSIS — N289 Disorder of kidney and ureter, unspecified: Secondary | ICD-10-CM

## 2013-04-22 LAB — PHOSPHORUS: Phosphorus: 4.2 mg/dL (ref 2.3–4.6)

## 2013-04-22 MED ORDER — GABAPENTIN 300 MG PO CAPS: 300.0000 mg | ORAL_CAPSULE | Freq: Every day | ORAL | Status: DC

## 2013-04-22 NOTE — Progress Notes (Addendum)
  Subjective:    Patient ID: Becky Baird, female    DOB: 01-28-1934, 79 y.o.   MRN: [id number]  Hypertension This is a chronic problem. The current episode started more than 1 year ago. The problem is unchanged. The problem is controlled. There are no associated agents to hypertension. There are no known risk factors for coronary artery disease. Treatments tried: HCTZ. The current treatment provides significant improvement. There are no compliance problems.   Patient is here for follow up visit since changing her blood pressure medication. Patient states she has no other concerns today.  She denies chest tightness pressure pain shortness of breath she will see the kidney doctor on October 10 PMH renal disease denies any calcium supplementation current Lab work reviewed with patient Results for orders placed in visit on 04/08/13  BASIC METABOLIC PANEL      Result Value Range   Sodium 141  135 - 145 mEq/L   Potassium 4.3  3.5 - 5.3 mEq/L   Chloride 101  96 - 112 mEq/L   CO2 32  19 - 32 mEq/L   Glucose, Bld 95  70 - 99 mg/dL   BUN 16  6 - 23 mg/dL   Creat 1.47 (*) 8.29 - 1.10 mg/dL   Calcium 56.2 (*) 8.4 - 10.5 mg/dL    Review of Systems Denies excessive thirst urination or blurred vision    Objective:   Physical Exam Blood pressure checked with cuff and our wall Lungs clear hearts regular pulse normal extremities no edema      Assessment & Plan:  #1 hypertension-her blood pressure machine averages out very good with our wall unit. In the morning when she checks her blood pressure she needs to sit for at least 5 minutes before checking it. I would recommend checking blood pressure only 3 times a week. Also send [place] some readings. She may need more than what she is currently on. She is seen in the kidney doctor/nephrology on May October 10. #2 hypercalcemia-we will check PTH phosphorus and calcium. Patient does not take any calcium supplementations. #3 she'll followup office visit in  3 months.

## 2013-04-23 LAB — PTH, INTACT AND CALCIUM: PTH: 34.1 pg/mL (ref 14.0–72.0)

## 2013-05-01 DIAGNOSIS — D649 Anemia, unspecified: Secondary | ICD-10-CM | POA: Diagnosis not present

## 2013-05-01 DIAGNOSIS — R300 Dysuria: Secondary | ICD-10-CM | POA: Diagnosis not present

## 2013-05-01 DIAGNOSIS — N2581 Secondary hyperparathyroidism of renal origin: Secondary | ICD-10-CM | POA: Diagnosis not present

## 2013-05-01 DIAGNOSIS — I129 Hypertensive chronic kidney disease with stage 1 through stage 4 chronic kidney disease, or unspecified chronic kidney disease: Secondary | ICD-10-CM | POA: Diagnosis not present

## 2013-05-01 DIAGNOSIS — I10 Essential (primary) hypertension: Secondary | ICD-10-CM | POA: Diagnosis not present

## 2013-05-09 DIAGNOSIS — N281 Cyst of kidney, acquired: Secondary | ICD-10-CM | POA: Diagnosis not present

## 2013-05-09 DIAGNOSIS — N183 Chronic kidney disease, stage 3 unspecified: Secondary | ICD-10-CM | POA: Diagnosis not present

## 2013-05-16 ENCOUNTER — Telehealth: Payer: Self-pay | Admitting: Family Medicine

## 2013-05-16 NOTE — Telephone Encounter (Signed)
Patients BP and pulse for 2 weeks span:  05/06/13 BP=142/72 Pulse=54 at 7:26 am 05/08/13 BP=124/68 Pulse=59 at 7:29 am 05/10/13 BP=106/57 Pulse=66 at 7:10 am  05/13/13 BP=141/70 Pulse=56 at 7:07 am 05/15/13 BP=111/63 Pulse=62 at 7:11 am 05/16/13 BP=119/65 Pulse=59 at 7:41 am

## 2013-05-16 NOTE — Telephone Encounter (Signed)
Also, patient wants to know if we have received U/S results from the [facility] in [place] on her kidneys.

## 2013-05-16 NOTE — Telephone Encounter (Signed)
BP are ok, nurses please check her chart for [place], if no reports have center from [facility] fax [place] a copy, inform pt should be able to call by early next week regarding [place]

## 2013-05-17 NOTE — Telephone Encounter (Signed)
Left message on voicemail notifying patient that BP is ok and we faxed over a request for copy of [place] to medical records at [facility].

## 2013-05-22 ENCOUNTER — Other Ambulatory Visit: Payer: Self-pay

## 2013-05-22 MED ORDER — POTASSIUM CHLORIDE ER 10 MEQ PO TBCR: 10.0000 meq | EXTENDED_RELEASE_TABLET | Freq: Two times a day (BID) | ORAL | Status: DC

## 2013-07-01 ENCOUNTER — Encounter (INDEPENDENT_AMBULATORY_CARE_PROVIDER_SITE_OTHER): Payer: Self-pay

## 2013-07-01 ENCOUNTER — Encounter: Payer: Self-pay | Admitting: Nurse Practitioner

## 2013-07-01 ENCOUNTER — Ambulatory Visit (INDEPENDENT_AMBULATORY_CARE_PROVIDER_SITE_OTHER): Payer: Medicare Other | Admitting: Nurse Practitioner

## 2013-07-01 VITALS — BP 150/64 | HR 49 | Temp 97.4°F | Ht 67.5 in | Wt 156.0 lb

## 2013-07-01 DIAGNOSIS — I6990 Unspecified sequelae of unspecified cerebrovascular disease: Secondary | ICD-10-CM

## 2013-07-01 DIAGNOSIS — N289 Disorder of kidney and ureter, unspecified: Secondary | ICD-10-CM | POA: Diagnosis not present

## 2013-07-01 DIAGNOSIS — E785 Hyperlipidemia, unspecified: Secondary | ICD-10-CM | POA: Diagnosis not present

## 2013-07-01 DIAGNOSIS — I10 Essential (primary) hypertension: Secondary | ICD-10-CM | POA: Diagnosis not present

## 2013-07-01 NOTE — Progress Notes (Signed)
PATIENT: Becky Baird DOB: November 18, 1933   REASON FOR VISIT: routine follow up for stroke HISTORY FROM: patient  HISTORY OF PRESENT ILLNESS: Becky Baird is a 79 year old Caucasian female with history of RMCA branch infarct on Nov 2010 with RICA stenosis s/p randomization into SAMMPRIS trial with PTA stent 06/11/09.   01/04/13 Here for follow up visit with repeat carotid dopplers and TCD. Patient reports she is doing well, exercising regularly and working in the yard often including mowing her lawn. Reports some balance instability but no falls.  No new neurological complaints. Feels that short term memory has improved. States that recent cholesterol lab work was good and BP is running 120-130/60-70 at home. Patient denies medication side effects, with no signs of bleeding.   UPDATE 07/01/13 (LL): Becky Baird returns for stroke follow up.  Repeat Carotid doppler study in June 2014 was negative for hemodynamically significant stenosis.  Repeat TCD's showed mild stenosis, more significant on the right.  She reports that she has been well, exercises regularly at [employer].  BP is well controlled and lipids are controlled with red yeast rice.  Patient denies medication side effects, with no signs of bleeding.   REVIEW OF SYSTEMS: Full 14 system review of systems performed and notable only for: allergies, frequency of urination, urgency, joint swelling.  ALLERGIES: Allergies  Allergen Reactions  . Niaspan [Niacin Er]     rash    HOME MEDICATIONS: Outpatient Prescriptions Prior to Visit  Medication Sig Dispense Refill  . ALPRAZolam (XANAX) 0.5 MG tablet Take 0.5 tablets (0.25 mg total) by mouth 2 (two) times daily as needed for sleep.  45 tablet  4  . aspirin 81 MG tablet Take 81 mg by mouth. 2 every day      . fish oil-omega-3 fatty acids 1000 MG capsule Take 2 g by mouth. Two twice a day      . gabapentin (NEURONTIN) 300 MG capsule Take 1 capsule (300 mg total) by mouth at  bedtime.  30 capsule  8  . hydrochlorothiazide (HYDRODIURIL) 25 MG tablet Take 1 tablet (25 mg total) by mouth daily.  30 tablet  4  . loratadine (CLARITIN) 10 MG tablet Take 10 mg by mouth daily.      . pantoprazole (PROTONIX) 40 MG tablet TAKE ONE TABLET ONCE DAILY  30 tablet  5  . potassium chloride (K-DUR) 10 MEQ tablet Take 1 tablet (10 mEq total) by mouth 2 (two) times daily.  60 tablet  4  . Red Yeast Rice Extract (RED YEAST RICE PO) Take by mouth. Two twice a day      . sodium chloride (OCEAN) 0.65 % nasal spray Place 1 spray into the nose as needed for congestion.      . Cholecalciferol (VITAMIN D) 2000 UNITS CAPS Take by mouth daily.       . potassium chloride (K-DUR,KLOR-CON) 10 MEQ tablet    Sig: Take 1 tablet by mouth 2 (two) times daily.    PAST MEDICAL HISTORY: Past Medical History  Diagnosis Date  . Stroke     2010  . Anxiety   . Hypertension   . Hyperlipidemia   . Renal cyst   . Ulcer   . Stroke   . Occlusion and stenosis of carotid artery with cerebral infarction   . Mild cognitive impairment, so stated   . Memory loss   . Other generalized ischemic cerebrovascular disease     PAST SURGICAL HISTORY: Past Surgical  History  Procedure Laterality Date  . Tonsillectomy      childhood  . Dilation and curettage of uterus      years ago  . Ovarian cyst removal      at least 10 years ago  . Colonoscopy  03/10/2011    Procedure: COLONOSCOPY;  Surgeon: Malissa Hippo, MD;  [facility];  Service: Endoscopy;  Laterality: N/A;  . Brain stent      FAMILY HISTORY: Family History  Problem Relation Age of Onset  . Diabetes Brother   . Stroke Mother   . Heart failure Father     SOCIAL HISTORY: History   Social History  . Marital Status: Widowed    Spouse Name: N/A    Number of Children: 2  . Years of Education: College   Occupational History  .     Social History Main Topics  . Smoking status: Never Smoker   . Smokeless tobacco: Never Used    . Alcohol Use: No  . Drug Use: No  . Sexual Activity: Not on file   Other Topics Concern  . Not on file   Social History Narrative   Patient lives at home alone.   Caffeine Use: very little, rarely   PHYSICAL EXAM  Filed Vitals:   07/01/13 0903  BP: 150/64  Pulse: 49  Temp: 97.4 F (36.3 C)  TempSrc: Oral  Height: 5' 7.5" (1.715 m)  Weight: 156 lb (70.761 kg)   Body mass index is 24.06 kg/(m^2).  GENERAL EXAM: Patient is in no distress, well developed and well groomed.  HEAD: Symmetric facial features.  EARS, NOSE, and THROAT: Normal.  NECK: Supple, no JVD  RESPIRATORY: Lungs CTA.  CARDIOVASCULAR: Regular rate and rhythm, 3/6 systolic murmur radiates to clavicle, no carotid bruits  SKIN: No rash, no bruising   NEUROLOGIC:  MENTAL STATUS: awake, alert and oriented to person, place and time, language MILD DYSFLUENCY, comprehension intact, naming intact  CRANIAL NERVE: pupils equal and reactive to light, visual fields full to confrontation, extraocular muscles intact, no nystagmus, facial sensation and strength symmetric, uvula midline, shoulder shrug symmetric, tongue midline.  MOTOR: normal bulk and tone, full strength in the BUE, BLE, decreased fine finger movements on left  SENSORY: normal and symmetric to light touch, pinprick, temperature, vibration  COORDINATION: finger-nose-finger normal, heel-shin normal  REFLEXES: deep tendon reflexes present and symmetric 2+,  GAIT/STATION: narrow based gait; able to walk on toes, heels and tandem; romberg is negative. No assistive device.  DIAGNOSTIC DATA (LABS, IMAGING, TESTING) - I reviewed patient records, labs, notes, testing and imaging myself where available.  Lab Results  Component Value Date   WBC 6.0 02/08/2013   HGB 12.3 02/08/2013   HCT 36.1 02/08/2013   MCV 88.7 02/08/2013   PLT 263 02/08/2013      Component Value Date/Time   NA 141 04/18/2013 0744   K 4.3 04/18/2013 0744   CL 101 04/18/2013 0744   CO2 32  04/18/2013 0744   GLUCOSE 95 04/18/2013 0744   BUN 16 04/18/2013 0744   CREATININE 1.40* 04/18/2013 0744   CREATININE 1.48* 02/22/2013 0850   CREATININE 1.06 08/17/2009 1902   CALCIUM 10.2 04/22/2013 1043   CALCIUM 10.2 04/22/2013 1043   PROT 7.2 03/28/2013 0810   ALBUMIN 4.5 03/28/2013 0810   AST 16 03/28/2013 0810   ALT 10 03/28/2013 0810   ALKPHOS 87 03/28/2013 0810   BILITOT 0.5 03/28/2013 0810   GFRNONAA 51* 08/17/2009 1902  GFRAA  Value: >60        The eGFR has been calculated using the MDRD equation. This calculation has not been validated in all clinical situations. eGFR's persistently <60 mL/min signify possible Chronic Kidney Disease. 08/17/2009 1902   Lab Results  Component Value Date   CHOL 153 03/28/2013   HDL 46 03/28/2013   LDLCALC 91 03/28/2013   TRIG 82 03/28/2013   CHOLHDL 3.3 03/28/2013   Lab Results  Component Value Date   HGBA1C 5.6 02/08/2013    ASSESSMENT AND PLAN Becky Baird is a 79 year old Caucasian female with history of R MCA branch infarct on Nov 2010 with R ICA stenosis s/p randomization into SAMMPRIS trial with PTA stent 06/11/09.   Continue aspirin 81 mg orally every day for secondary stroke prevention and maintain strict control of hypertension with blood pressure goal below 130/90, diabetes with hemoglobin A1c goal below 6.5% and lipids with LDL cholesterol goal below 100 mg/dL.  Continue healthy diet and regular exercise, goal 30 minutes 4-5 days per week.  Patient was referred for the DART Pharmacogenetic testing study.  Return in about 6 months (around 12/30/2013).  Ronal Fear, MSN, NP-C 07/01/2013, 10:58 AM [facility] [address] [place], [place] [address] [phone]  Note: This document was prepared with digital dictation and possible smart phrase technology. Any transcriptional errors that result from this process are unintentional.

## 2013-07-22 ENCOUNTER — Ambulatory Visit: Payer: Medicare Other | Admitting: Family Medicine

## 2013-07-29 ENCOUNTER — Telehealth: Payer: Self-pay | Admitting: Family Medicine

## 2013-07-29 ENCOUNTER — Other Ambulatory Visit: Payer: Self-pay | Admitting: *Deleted

## 2013-07-29 DIAGNOSIS — N289 Disorder of kidney and ureter, unspecified: Secondary | ICD-10-CM

## 2013-07-29 NOTE — Telephone Encounter (Signed)
Does patient need BW paperwork for visit on Monday?

## 2013-07-29 NOTE — Telephone Encounter (Signed)
Sure ( hint: Met 7 checks-> Na, K, Chloride, glucose, BUN, Creatinine, Calcium, and bicarb) (so therefore Met 7 already has creatinine as part of it.thanks) 

## 2013-07-29 NOTE — Telephone Encounter (Signed)
On this visit she only needs to do Met 7 ( JLL:VDIXV insuff)

## 2013-07-31 DIAGNOSIS — N289 Disorder of kidney and ureter, unspecified: Secondary | ICD-10-CM | POA: Diagnosis not present

## 2013-07-31 LAB — BASIC METABOLIC PANEL
BUN: 15 mg/dL (ref 6–23)
CALCIUM: 9.8 mg/dL (ref 8.4–10.5)
CO2: 30 mEq/L (ref 19–32)
Chloride: 104 mEq/L (ref 96–112)
Creat: 1.32 mg/dL — ABNORMAL HIGH (ref 0.50–1.10)
Glucose, Bld: 84 mg/dL (ref 70–99)
Potassium: 4.6 mEq/L (ref 3.5–5.3)
SODIUM: 142 meq/L (ref 135–145)

## 2013-08-05 ENCOUNTER — Encounter: Payer: Self-pay | Admitting: Family Medicine

## 2013-08-05 ENCOUNTER — Ambulatory Visit (INDEPENDENT_AMBULATORY_CARE_PROVIDER_SITE_OTHER): Payer: Medicare Other | Admitting: Family Medicine

## 2013-08-05 VITALS — BP 140/88 | Ht 67.5 in | Wt 156.8 lb

## 2013-08-05 DIAGNOSIS — R7309 Other abnormal glucose: Secondary | ICD-10-CM | POA: Diagnosis not present

## 2013-08-05 DIAGNOSIS — R739 Hyperglycemia, unspecified: Secondary | ICD-10-CM

## 2013-08-05 DIAGNOSIS — I359 Nonrheumatic aortic valve disorder, unspecified: Secondary | ICD-10-CM | POA: Diagnosis not present

## 2013-08-05 DIAGNOSIS — E785 Hyperlipidemia, unspecified: Secondary | ICD-10-CM | POA: Diagnosis not present

## 2013-08-05 DIAGNOSIS — R7303 Prediabetes: Secondary | ICD-10-CM

## 2013-08-05 DIAGNOSIS — I351 Nonrheumatic aortic (valve) insufficiency: Secondary | ICD-10-CM | POA: Insufficient documentation

## 2013-08-05 LAB — POCT GLYCOSYLATED HEMOGLOBIN (HGB A1C): HEMOGLOBIN A1C: 6

## 2013-08-05 NOTE — Progress Notes (Signed)
   Subjective:    Patient ID: Becky Baird, female    DOB: 1933-10-27, 80 y.o.   MRN: [id number]  HPI Patient is here today for a 3 month check up on hypertension. Patient was talked to at length about the importance of DASH diet. Also exercise. Patient was also discussed the importance of keeping fairly active she denies any chest tightness pressure pain shortness of breath denies any swelling in the legs. Denies any rectal bleeding. Patient does try to follow healthy diet avoids fried foods. States she got more starches during Christmastime than what she normally does. She has an OV with Dr. Posey Pronto tomorrow and would like 2 copies of her blood work.  She has no other concerns.     Review of Systems See above    Objective:   Physical Exam  Vitals reviewed. Constitutional: She appears well-nourished. No distress.  Cardiovascular: Normal rate, regular rhythm and normal heart sounds.   No murmur heard. Pulmonary/Chest: Effort normal and breath sounds normal. No respiratory distress.  Abdominal: Soft. She exhibits no distension. There is no tenderness.  Musculoskeletal: She exhibits no edema.  Lymphadenopathy:    She has no cervical adenopathy.  Neurological: She is alert. She exhibits normal muscle tone.  Psychiatric: Her behavior is normal.   It should be noted that the patient did in fact have a murmur loudest on the right upper chest region sternal border consistent with aortic insufficiency or regurgitation this seem more harsh than previous       Assessment & Plan:  #1 renal insufficiency her numbers actually looked better she will see the kidney specialist tomorrow no other interventions necessary currently  #2 prediabetes-importance of minimizing starches in the diet stressed. Medication not indicated currently  #3 hyperlipidemia continue current medication recheck lab work again in several months time  #4 the importance of diet, exercise, taking her medicine stress.  Followup in 3 months time

## 2013-08-06 DIAGNOSIS — N039 Chronic nephritic syndrome with unspecified morphologic changes: Secondary | ICD-10-CM | POA: Diagnosis not present

## 2013-08-06 DIAGNOSIS — D631 Anemia in chronic kidney disease: Secondary | ICD-10-CM | POA: Diagnosis not present

## 2013-08-06 DIAGNOSIS — I10 Essential (primary) hypertension: Secondary | ICD-10-CM | POA: Diagnosis not present

## 2013-08-06 DIAGNOSIS — N183 Chronic kidney disease, stage 3 unspecified: Secondary | ICD-10-CM | POA: Diagnosis not present

## 2013-08-06 DIAGNOSIS — N2581 Secondary hyperparathyroidism of renal origin: Secondary | ICD-10-CM | POA: Diagnosis not present

## 2013-08-14 ENCOUNTER — Ambulatory Visit (HOSPITAL_COMMUNITY)
Admission: RE | Admit: 2013-08-14 | Discharge: 2013-08-14 | Disposition: A | Discharge status: Home / Self Care | Payer: Medicare Other | Source: Ambulatory Visit | Attending: Family Medicine | Admitting: Family Medicine

## 2013-08-14 DIAGNOSIS — I379 Nonrheumatic pulmonary valve disorder, unspecified: Secondary | ICD-10-CM | POA: Diagnosis not present

## 2013-08-14 DIAGNOSIS — I10 Essential (primary) hypertension: Secondary | ICD-10-CM | POA: Diagnosis not present

## 2013-08-14 DIAGNOSIS — Z8673 Personal history of transient ischemic attack (TIA), and cerebral infarction without residual deficits: Secondary | ICD-10-CM | POA: Diagnosis not present

## 2013-08-14 DIAGNOSIS — E785 Hyperlipidemia, unspecified: Secondary | ICD-10-CM | POA: Diagnosis not present

## 2013-08-14 DIAGNOSIS — I079 Rheumatic tricuspid valve disease, unspecified: Secondary | ICD-10-CM | POA: Diagnosis not present

## 2013-08-14 DIAGNOSIS — I080 Rheumatic disorders of both mitral and aortic valves: Secondary | ICD-10-CM | POA: Insufficient documentation

## 2013-08-14 DIAGNOSIS — I351 Nonrheumatic aortic (valve) insufficiency: Secondary | ICD-10-CM

## 2013-08-14 DIAGNOSIS — I059 Rheumatic mitral valve disease, unspecified: Secondary | ICD-10-CM | POA: Diagnosis not present

## 2013-08-14 NOTE — Progress Notes (Signed)
*  PRELIMINARY RESULTS* Echocardiogram 2D Echocardiogram has been performed.  Spencer, Mason 08/14/2013, 10:04 AM

## 2013-08-15 ENCOUNTER — Telehealth: Payer: Self-pay | Admitting: Family Medicine

## 2013-08-15 NOTE — Telephone Encounter (Signed)
Patient would like results to echocardiogram

## 2013-08-16 NOTE — Telephone Encounter (Signed)
Her ejection fraction looks very good. The heart is squeezing well and working well. I am concerned the mitral valve does have mild regurgitation. This needs further followup with a cardiologist. I can set her up with [facility] at [facility] Please talk with the patient make sure this is okay with her. If it is okay with her please make a referral to cardiology for mitral regurgitation. The reason for this referral list this condition can get worse as a person gets older and sometimes require special treatment.

## 2013-08-16 NOTE — Telephone Encounter (Signed)
I spoke with daughter, Amparo Bristol, with Kaeleen's permission. She is going to do her own research on which cardiologist she would like for her mother to be referred to. She will be calling us back with her choice.

## 2013-08-16 NOTE — Telephone Encounter (Signed)
I spoke with daughter, Amparo Bristol, with Jamisen's permission. She would like to know if she can get a report of the echo faxed to her? [phone]) She is also going to do her own research on which cardiologist she would like for her mother to be referred to. She will be calling us back with her choice.

## 2013-08-19 ENCOUNTER — Other Ambulatory Visit: Payer: Self-pay | Admitting: Family Medicine

## 2013-08-19 ENCOUNTER — Other Ambulatory Visit: Payer: Self-pay

## 2013-08-19 DIAGNOSIS — I340 Nonrheumatic mitral (valve) insufficiency: Secondary | ICD-10-CM

## 2013-09-10 ENCOUNTER — Institutional Professional Consult (permissible substitution): Payer: Medicare Other | Admitting: Cardiology

## 2013-09-11 ENCOUNTER — Institutional Professional Consult (permissible substitution): Payer: Medicare Other | Admitting: Cardiovascular Disease

## 2013-09-24 ENCOUNTER — Institutional Professional Consult (permissible substitution): Payer: Medicare Other | Admitting: Cardiology

## 2013-10-01 ENCOUNTER — Other Ambulatory Visit: Payer: Self-pay | Admitting: Family Medicine

## 2013-10-01 DIAGNOSIS — Z1231 Encounter for screening mammogram for malignant neoplasm of breast: Secondary | ICD-10-CM

## 2013-10-02 ENCOUNTER — Other Ambulatory Visit: Payer: Self-pay | Admitting: Family Medicine

## 2013-10-03 ENCOUNTER — Encounter: Payer: Self-pay | Admitting: *Deleted

## 2013-10-03 NOTE — Telephone Encounter (Signed)
Refill this +4 additional refills 

## 2013-10-08 ENCOUNTER — Telehealth: Payer: Self-pay | Admitting: Family Medicine

## 2013-10-08 DIAGNOSIS — E782 Mixed hyperlipidemia: Secondary | ICD-10-CM

## 2013-10-08 DIAGNOSIS — Z79899 Other long term (current) drug therapy: Secondary | ICD-10-CM

## 2013-10-08 DIAGNOSIS — R7301 Impaired fasting glucose: Secondary | ICD-10-CM

## 2013-10-08 NOTE — Telephone Encounter (Signed)
Pt needs bw orders for 4/13 Appt

## 2013-10-08 NOTE — Telephone Encounter (Signed)
Lipid liver met 7 on  9/14 and met 7 and a1c on 1/15

## 2013-10-09 NOTE — Telephone Encounter (Signed)
If patient will do the lab work after 3 months from her last A1c then she can do metabolic 7, hemoglobin E2C, lipid with followup office visit

## 2013-10-09 NOTE — Telephone Encounter (Signed)
Discussed with patient. bloodwork orders ready and pt rescheduled her appt for the end of April. She will do bloodwork after 04/15.

## 2013-10-09 NOTE — Telephone Encounter (Signed)
[facility] to notify pt bloodwork orders are ready but patient needs to do 4/15 or later. Last A1C was 01/15. Needs to be 3 months. Patient has an appt for 04/13. Need to reschedule for the end of April after she does bloodwork.

## 2013-10-10 ENCOUNTER — Other Ambulatory Visit: Payer: Self-pay | Admitting: *Deleted

## 2013-10-10 MED ORDER — ALPRAZOLAM 0.5 MG PO TABS: ORAL_TABLET | ORAL | Status: DC

## 2013-10-12 ENCOUNTER — Other Ambulatory Visit: Payer: Self-pay | Admitting: Family Medicine

## 2013-10-14 ENCOUNTER — Encounter: Payer: Self-pay | Admitting: Cardiology

## 2013-10-14 ENCOUNTER — Ambulatory Visit (INDEPENDENT_AMBULATORY_CARE_PROVIDER_SITE_OTHER): Payer: Medicare Other | Admitting: Cardiology

## 2013-10-14 VITALS — BP 130/76 | HR 49 | Ht 67.5 in | Wt 151.0 lb

## 2013-10-14 DIAGNOSIS — I359 Nonrheumatic aortic valve disorder, unspecified: Secondary | ICD-10-CM

## 2013-10-14 DIAGNOSIS — I351 Nonrheumatic aortic (valve) insufficiency: Secondary | ICD-10-CM

## 2013-10-14 NOTE — Patient Instructions (Signed)
The current medical regimen is effective;  continue present plan and medications.  Follow up in 1 year with Dr Hochrein.  You will receive a letter in the mail 2 months before you are due.  Please call us when you receive this letter to schedule your follow up appointment.  

## 2013-10-14 NOTE — Progress Notes (Signed)
HPI The patient presents for evaluation of a heart murmur. She has had this before with a distant echocardiogram. More recently she had a followup echo and I was able to review these results. She has some mild aortic sclerosis with some mild left ventricular hypertrophy but preserved ejection fraction. There were no other significant abnormalities. She has had a previous stroke with right MCA stenting. She has some residual deficit from as but this is mostly short-term memory and multitasking.  She's able to do yard work and [employer] without limitations. The patient denies any new symptoms such as chest discomfort, neck or arm discomfort. There has been no new shortness of breath, PND or orthopnea. There have been no reported palpitations, presyncope or syncope.  Allergies  Allergen Reactions  . Niaspan [Niacin Er]     rash    Current Outpatient Prescriptions  Medication Sig Dispense Refill  . ALPRAZolam (XANAX) 0.5 MG tablet Take 1/2 tablet by mouth every 6 hours as needed for anxiety and 1/2 - 1 tablet at bedtime as needed for sleep.  45 tablet  4  . aspirin 81 MG tablet Take 81 mg by mouth. 2 every day      . Cholecalciferol (VITAMIN D-3 PO) Take 2,000 Int'l Units by mouth daily.      . fish oil-omega-3 fatty acids 1000 MG capsule Take 2 g by mouth. Two twice a day      . gabapentin (NEURONTIN) 300 MG capsule Take 1 capsule (300 mg total) by mouth at bedtime.  30 capsule  8  . hydrochlorothiazide (HYDRODIURIL) 25 MG tablet TAKE ONE TABLET BY MOUTH DAILY  30 tablet  5  . loratadine (CLARITIN) 10 MG tablet Take 10 mg by mouth daily.      . pantoprazole (PROTONIX) 40 MG tablet TAKE ONE TABLET ONCE DAILY  30 tablet  5  . potassium chloride (K-DUR) 10 MEQ tablet Take 1 tablet (10 mEq total) by mouth 2 (two) times daily.  60 tablet  4  . Red Yeast Rice Extract (RED YEAST RICE PO) Take 600 mg by mouth. Two twice a day      . sodium chloride (OCEAN) 0.65 % nasal spray Place 1 spray into  the nose as needed for congestion.       No current facility-administered medications for this visit.    Past Medical History  Diagnosis Date  . Stroke     2010  . Anxiety   . Hypertension   . Hyperlipidemia   . Renal cyst   . Ulcer   . Stroke   . Occlusion and stenosis of carotid artery with cerebral infarction   . Mild cognitive impairment, so stated   . Memory loss   . Other generalized ischemic cerebrovascular disease     Past Surgical History  Procedure Laterality Date  . Tonsillectomy      childhood  . Dilation and curettage of uterus      years ago  . Ovarian cyst removal      at least 10 years ago  . Colonoscopy  03/10/2011    Procedure: COLONOSCOPY;  Surgeon: Rogene Houston, MD;  Location: [facility];  Service: Endoscopy;  Laterality: N/A;  . Brain stent      Family History  Problem Relation Age of Onset  . Diabetes Brother   . Stroke Mother   . Heart failure Father     History   Social History  . Marital Status: Widowed  Spouse Name: N/A    Number of Children: 2  . Years of Education: College   Occupational History  .     Social History Main Topics  . Smoking status: Never Smoker   . Smokeless tobacco: Never Used  . Alcohol Use: No  . Drug Use: No  . Sexual Activity: Not on file   Other Topics Concern  . Not on file   Social History Narrative   Patient lives at home alone.   Caffeine Use: very little, rarely    ROS:  As stated in the HPI and negative for all other systems.  PHYSICAL EXAM BP 130/76  Pulse 49  Ht 5' 7.5" (1.715 m)  Wt 151 lb (68.493 kg)  BMI 23.29 kg/m2 GENERAL:  Well appearing HEENT:  Pupils equal round and reactive, fundi not visualized, oral mucosa unremarkable NECK:  No jugular venous distention, waveform within normal limits, carotid upstroke brisk and symmetric, no bruits, no thyromegaly LYMPHATICS:  No cervical, inguinal adenopathy LUNGS:  Clear to auscultation bilaterally BACK:  No CVA  tenderness CHEST:  Unremarkable HEART:  PMI not displaced or sustained,S1 and S2 within normal limits, no S3, no S4, no clicks, no rubs, 2/6 apical systolic murmur radiating out the aortic outflow tract, no diastolic murmurs ABD:  Flat, positive bowel sounds normal in frequency in pitch, no bruits, no rebound, no guarding, no midline pulsatile mass, no hepatomegaly, no splenomegaly EXT:  2 plus pulses throughout, no edema, no cyanosis no clubbing SKIN:  No rashes no nodules NEURO:  Cranial nerves II through XII grossly intact, motor grossly intact throughout PSYCH:  Cognitively intact, oriented to person place and time   EKG:  Sinus bradycardia, rate 49, first-degree AV block, axis within normal limits, poor anterior R wave progression, possible old anteroseptal infarct, no acute ST-T wave changes. 10/14/2013   ASSESSMENT AND PLAN  AORTIC SCLEROSIS:  This Is mild and can be followed clinically. No further imaging is indicated.  ABNORMAL EKG:  This would indicate an old anteroseptal infarct but nothing in her history or echo would suggest this. No further evaluation is indicated.  BRADYCARDIA:  She has a slow heart rhythm but no symptoms related to this. We did discuss this.

## 2013-10-29 ENCOUNTER — Ambulatory Visit (HOSPITAL_COMMUNITY)
Admission: RE | Admit: 2013-10-29 | Discharge: 2013-10-29 | Disposition: A | Discharge status: Home / Self Care | Payer: Medicare Other | Source: Ambulatory Visit | Attending: Family Medicine | Admitting: Family Medicine

## 2013-10-29 DIAGNOSIS — Z1231 Encounter for screening mammogram for malignant neoplasm of breast: Secondary | ICD-10-CM | POA: Insufficient documentation

## 2013-11-04 ENCOUNTER — Ambulatory Visit: Payer: Medicare Other | Admitting: Family Medicine

## 2013-11-06 DIAGNOSIS — E782 Mixed hyperlipidemia: Secondary | ICD-10-CM | POA: Diagnosis not present

## 2013-11-06 DIAGNOSIS — Z79899 Other long term (current) drug therapy: Secondary | ICD-10-CM | POA: Diagnosis not present

## 2013-11-06 DIAGNOSIS — R7301 Impaired fasting glucose: Secondary | ICD-10-CM | POA: Diagnosis not present

## 2013-11-06 LAB — HEMOGLOBIN A1C
Hgb A1c MFr Bld: 5.9 % — ABNORMAL HIGH (ref <5.7)
Mean Plasma Glucose: 123 mg/dL — ABNORMAL HIGH (ref <117)

## 2013-11-06 LAB — BASIC METABOLIC PANEL
BUN: 18 mg/dL (ref 6–23)
CALCIUM: 10.1 mg/dL (ref 8.4–10.5)
CO2: 31 meq/L (ref 19–32)
Chloride: 102 mEq/L (ref 96–112)
Creat: 1.41 mg/dL — ABNORMAL HIGH (ref 0.50–1.10)
GLUCOSE: 81 mg/dL (ref 70–99)
Potassium: 4.3 mEq/L (ref 3.5–5.3)
SODIUM: 140 meq/L (ref 135–145)

## 2013-11-06 LAB — LIPID PANEL
CHOLESTEROL: 175 mg/dL (ref 0–200)
HDL: 44 mg/dL (ref >39)
LDL Cholesterol: 114 mg/dL — ABNORMAL HIGH (ref 0–99)
Total CHOL/HDL Ratio: 4 Ratio
Triglycerides: 84 mg/dL (ref <150)
VLDL: 17 mg/dL (ref 0–40)

## 2013-11-18 ENCOUNTER — Ambulatory Visit (INDEPENDENT_AMBULATORY_CARE_PROVIDER_SITE_OTHER): Payer: Medicare Other | Admitting: Family Medicine

## 2013-11-18 ENCOUNTER — Encounter: Payer: Self-pay | Admitting: Family Medicine

## 2013-11-18 VITALS — BP 122/64 | Ht 67.5 in | Wt 159.6 lb

## 2013-11-18 DIAGNOSIS — R7303 Prediabetes: Secondary | ICD-10-CM

## 2013-11-18 DIAGNOSIS — N289 Disorder of kidney and ureter, unspecified: Secondary | ICD-10-CM | POA: Diagnosis not present

## 2013-11-18 DIAGNOSIS — M899 Disorder of bone, unspecified: Secondary | ICD-10-CM

## 2013-11-18 DIAGNOSIS — I10 Essential (primary) hypertension: Secondary | ICD-10-CM

## 2013-11-18 DIAGNOSIS — R7309 Other abnormal glucose: Secondary | ICD-10-CM | POA: Diagnosis not present

## 2013-11-18 DIAGNOSIS — Z23 Encounter for immunization: Secondary | ICD-10-CM

## 2013-11-18 DIAGNOSIS — M949 Disorder of cartilage, unspecified: Secondary | ICD-10-CM

## 2013-11-18 DIAGNOSIS — E785 Hyperlipidemia, unspecified: Secondary | ICD-10-CM

## 2013-11-18 DIAGNOSIS — M8580 Other specified disorders of bone density and structure, unspecified site: Secondary | ICD-10-CM

## 2013-11-18 MED ORDER — HYDROCHLOROTHIAZIDE 25 MG PO TABS: 25.0000 mg | ORAL_TABLET | Freq: Every day | ORAL | Status: DC

## 2013-11-18 MED ORDER — POTASSIUM CHLORIDE CRYS ER 10 MEQ PO TBCR: 10.0000 meq | EXTENDED_RELEASE_TABLET | Freq: Two times a day (BID) | ORAL | Status: DC

## 2013-11-18 MED ORDER — PANTOPRAZOLE SODIUM 40 MG PO TBEC: 40.0000 mg | DELAYED_RELEASE_TABLET | Freq: Every day | ORAL | Status: DC

## 2013-11-18 MED ORDER — ALPRAZOLAM 0.5 MG PO TABS: ORAL_TABLET | ORAL | Status: DC

## 2013-11-18 MED ORDER — GABAPENTIN 300 MG PO CAPS: 300.0000 mg | ORAL_CAPSULE | Freq: Every day | ORAL | Status: DC

## 2013-11-18 NOTE — Patient Instructions (Signed)
DASH Diet  The DASH diet stands for "Dietary Approaches to Stop Hypertension." It is a healthy eating plan that has been shown to reduce high blood pressure (hypertension) in as little as 14 days, while also possibly providing other significant health benefits. These other health benefits include reducing the risk of breast cancer after menopause and reducing the risk of type 2 diabetes, heart disease, colon cancer, and stroke. Health benefits also include weight loss and slowing kidney failure in patients with chronic kidney disease.   DIET GUIDELINES  · Limit salt (sodium). Your diet should contain less than 1500 mg of sodium daily.  · Limit refined or processed carbohydrates. Your diet should include mostly whole grains. Desserts and added sugars should be used sparingly.  · Include small amounts of heart-healthy fats. These types of fats include nuts, oils, and tub margarine. Limit saturated and trans fats. These fats have been shown to be harmful in the body.  CHOOSING FOODS   The following food groups are based on a 2000 calorie diet. See your Registered Dietitian for individual calorie needs.  Grains and Grain Products (6 to 8 servings daily)  · Eat More Often: Whole-wheat bread, brown rice, whole-grain or wheat pasta, quinoa, popcorn without added fat or salt (air popped).  · Eat Less Often: White bread, white pasta, white rice, cornbread.  Vegetables (4 to 5 servings daily)  · Eat More Often: Fresh, frozen, and canned vegetables. Vegetables may be raw, steamed, roasted, or grilled with a minimal amount of fat.  · Eat Less Often/Avoid: Creamed or fried vegetables. Vegetables in a cheese sauce.  Fruit (4 to 5 servings daily)  · Eat More Often: All fresh, canned (in natural juice), or frozen fruits. Dried fruits without added sugar. One hundred percent fruit juice (½ cup [237 mL] daily).  · Eat Less Often: Dried fruits with added sugar. Canned fruit in light or heavy syrup.  Lean Meats, Fish, and Poultry (2  servings or less daily. One serving is 3 to 4 oz [85-114 g]).  · Eat More Often: Ninety percent or leaner ground beef, tenderloin, sirloin. Round cuts of beef, chicken breast, turkey breast. All fish. Grill, bake, or broil your meat. Nothing should be fried.  · Eat Less Often/Avoid: Fatty cuts of meat, turkey, or chicken leg, thigh, or wing. Fried cuts of meat or fish.  Dairy (2 to 3 servings)  · Eat More Often: Low-fat or fat-free milk, low-fat plain or light yogurt, reduced-fat or part-skim cheese.  · Eat Less Often/Avoid: Milk (whole, 2%). Whole milk yogurt. Full-fat cheeses.  Nuts, Seeds, and Legumes (4 to 5 servings per week)  · Eat More Often: All without added salt.  · Eat Less Often/Avoid: Salted nuts and seeds, canned beans with added salt.  Fats and Sweets (limited)  · Eat More Often: Vegetable oils, tub margarines without trans fats, sugar-free gelatin. Mayonnaise and salad dressings.  · Eat Less Often/Avoid: Coconut oils, palm oils, butter, stick margarine, cream, half and half, cookies, candy, pie.  FOR MORE INFORMATION  The Dash Diet Eating Plan: www.dashdiet.org  Document Released: 06/30/2011 Document Revised: 10/03/2011 Document Reviewed: 06/30/2011  ExitCare® Patient Information ©2014 [employer].

## 2013-11-18 NOTE — Progress Notes (Signed)
   Subjective:    Patient ID: HONESTII MARTON, female    DOB: 06/30/1934, 80 y.o.   MRN: [id number]  HPIMed check up. Patient states no concerns today. Patient had bloodwork on 11/06/13. Long discussion held regarding her medications regarding exercise Also had significant discussion regarding the results of the patient's lab work. Patient denies any stroke symptoms no unilateral numbness or weakness. Denies any headaches nausea vomiting or diarrhea. She does state she is eating relatively healthy.  Review of Systems  Constitutional: Negative for activity change, appetite change and fatigue.  Respiratory: Negative for cough and shortness of breath.   Cardiovascular: Negative for chest pain.  Gastrointestinal: Negative for abdominal pain.  Endocrine: Negative for polydipsia and polyphagia.  Genitourinary: Negative for frequency and flank pain.  Neurological: Negative for weakness, light-headedness and headaches.  Psychiatric/Behavioral: Negative for confusion.       Objective:   Physical Exam  Vitals reviewed. Constitutional: She appears well-nourished. No distress.  Cardiovascular: Normal rate, regular rhythm and normal heart sounds.   No murmur heard. Pulmonary/Chest: Effort normal and breath sounds normal. No respiratory distress.  Musculoskeletal: She exhibits no edema.  Lymphadenopathy:    She has no cervical adenopathy.  Neurological: She is alert. She exhibits normal muscle tone.  Psychiatric: Her behavior is normal.          Assessment & Plan:  1. Hypertension This patient's blood pressure overall is under good control continue medication and diet  2. Prediabetes She is to watch starch is in her diet stay physically active we'll relook at this issue in 6 months  3. Renal insufficiency Her renal insufficiency is stable. She is seeing a kidney specialist again in approximately 6 months  4. Hyperlipidemia Lab work looks good she needs to do a better job on  diet.  5. Need for prophylactic vaccination against Streptococcus pneumoniae (pneumococcus) Patient gives consent - Pneumococcal conjugate vaccine 13-valent IM  6. Need for prophylactic vaccination with combined diphtheria-tetanus-pertussis (DTP) vaccine Patient gives consent - Td vaccine greater than or equal to 7yo preservative free IM  7. Osteopenia Patient does need bone density this was ordered - DG Bone Density; Future - DG Bone Density

## 2014-01-13 ENCOUNTER — Encounter: Payer: Self-pay | Admitting: Nurse Practitioner

## 2014-01-13 ENCOUNTER — Ambulatory Visit (INDEPENDENT_AMBULATORY_CARE_PROVIDER_SITE_OTHER): Payer: Medicare Other | Admitting: Nurse Practitioner

## 2014-01-13 ENCOUNTER — Encounter (INDEPENDENT_AMBULATORY_CARE_PROVIDER_SITE_OTHER): Payer: Self-pay

## 2014-01-13 VITALS — BP 125/65 | HR 53 | Wt 154.5 lb

## 2014-01-13 DIAGNOSIS — I6990 Unspecified sequelae of unspecified cerebrovascular disease: Secondary | ICD-10-CM

## 2014-01-13 DIAGNOSIS — I6350 Cerebral infarction due to unspecified occlusion or stenosis of unspecified cerebral artery: Secondary | ICD-10-CM

## 2014-01-13 NOTE — Patient Instructions (Signed)
Continue aspirin 81 mg orally every day for secondary stroke prevention and maintain strict control of hypertension with blood pressure goal below 130/90, and lipids with LDL cholesterol goal below 100 mg/dL.  Continue healthy diet and regular exercise, goal 30 minutes 4-5 days per week.  Return for follow up in about 6 months, sooner as needed.  Stroke Prevention Some medical conditions and behaviors are associated with an increased chance of having a stroke. You may prevent a stroke by making healthy choices and managing medical conditions. HOW CAN I REDUCE MY RISK OF HAVING A STROKE?   Stay physically active. Get at least 30 minutes of activity on most or all days.  Do not smoke. It may also be helpful to avoid exposure to secondhand smoke.  Limit alcohol use. Moderate alcohol use is considered to be:  No more than 2 drinks per day for men.  No more than 1 drink per day for nonpregnant women.  Eat healthy foods. This involves  Eating 5 or more servings of fruits and vegetables a day.  Following a diet that addresses high blood pressure (hypertension), high cholesterol, diabetes, or obesity.  Manage your cholesterol levels.  A diet low in saturated fat, trans fat, and cholesterol and high in fiber may control cholesterol levels.  Take any prescribed medicines to control cholesterol as directed by your health care provider.  Manage your diabetes.  A controlled-carbohydrate, controlled-sugar diet is recommended to manage diabetes.  Take any prescribed medicines to control diabetes as directed by your health care provider.  Control your hypertension.  A low-salt (sodium), low-saturated fat, low-trans fat, and low-cholesterol diet is recommended to manage hypertension.  Take any prescribed medicines to control hypertension as directed by your health care provider.  Maintain a healthy weight.  A reduced-calorie, low-sodium, low-saturated fat, low-trans fat, low-cholesterol  diet is recommended to manage weight.  Stop drug abuse.  Avoid taking birth control pills.  Talk to your health care provider about the risks of taking birth control pills if you are over 67 years old, smoke, get migraines, or have ever had a blood clot.  Get evaluated for sleep disorders (sleep apnea).  Talk to your health care provider about getting a sleep evaluation if you snore a lot or have excessive sleepiness.  Take medicines as directed by your health care provider.  For some people, aspirin or blood thinners (anticoagulants) are helpful in reducing the risk of forming abnormal blood clots that can lead to stroke. If you have the irregular heart rhythm of atrial fibrillation, you should be on a blood thinner unless there is a good reason you cannot take them.  Understand all your medicine instructions.  Make sure that other other conditions (such as anemia or atherosclerosis) are addressed. SEEK IMMEDIATE MEDICAL CARE IF:   You have sudden weakness or numbness of the face, arm, or leg, especially on one side of the body.  Your face or eyelid droops to one side.  You have sudden confusion.  You have trouble speaking (aphasia) or understanding.  You have sudden trouble seeing in one or both eyes.  You have sudden trouble walking.  You have dizziness.  You have a loss of balance or coordination.  You have a sudden, severe headache with no known cause.  You have new chest pain or an irregular heartbeat. Any of these symptoms may represent a serious problem that is an emergency. Do not wait to see if the symptoms will go away. Get medical help at  once. Call your local emergency services  (911 in [place] Do not drive yourself to the hospital. Document Released: 08/18/2004 Document Revised: 05/01/2013 Document Reviewed: 01/11/2013 [facility] Patient Information 2015 [place], [place]. This information is not intended to replace advice given to you by your health care provider.  Make sure you discuss any questions you have with your health care provider.

## 2014-01-13 NOTE — Progress Notes (Signed)
PATIENT: Becky Baird DOB: Jul 19, 1934  REASON FOR VISIT: routine follow up for stroke HISTORY FROM: patient  HISTORY OF PRESENT ILLNESS: [name] is a 80 year old Caucasian female with history of RMCA branch infarct on Nov 2010 with RICA stenosis s/p randomization into SAMMPRIS trial with PTA stent 06/11/09.  01/04/13 Here for follow up visit with repeat carotid dopplers and TCD. Patient reports she is doing well, exercising regularly and working in the yard often including mowing her lawn. Reports some balance instability but no falls.  No new neurological complaints. Feels that short term memory has improved. States that recent cholesterol lab work was good and BP is running 120-130/60-70 at home. Patient denies medication side effects, with no signs of bleeding.  UPDATE 07/01/13 (LL): [name] returns for stroke follow up. Repeat Carotid doppler study in June 2014 was negative for hemodynamically significant stenosis. Repeat TCD's showed mild stenosis, more significant on the right. She reports that she has been well, exercises regularly at [employer]. BP is well controlled and lipids are controlled with red yeast rice. Patient denies medication side effects, with no signs of bleeding.   UPDATE 01/13/14 (LL): Since last visit, patient has been well, no recurrent stroke symptoms.  Recent labs in April show LDL of 114 and Hgb A1c 5.9.  Blood pressure is well controlled, it is 125/65 in the office today.  Last carotid doppler in June 2014 was negative for hemodynamically significant stenosis.   She is tolerating daily aspirin well without significant bleeding or bruising.  Still exercising at aerobics 3x per week.   REVIEW OF SYSTEMS: Full 14 system review of systems performed and notable only for: allergies,heart murmur.  ALLERGIES: Allergies  Allergen Reactions  . Niaspan [Niacin Er]     rash    HOME MEDICATIONS: Outpatient Prescriptions Prior to Visit  Medication Sig  Dispense Refill  . ALPRAZolam (XANAX) 0.5 MG tablet Take 1/2 tablet by mouth every 6 hours as needed for anxiety and 1/2 - 1 tablet at bedtime as needed for sleep.  45 tablet  5  . aspirin 81 MG tablet Take 81 mg by mouth. 2 every day      . Cholecalciferol (VITAMIN D-3 PO) Take 2,000 Int'l Units by mouth daily.      . fish oil-omega-3 fatty acids 1000 MG capsule Take 2 g by mouth. Two twice a day      . gabapentin (NEURONTIN) 300 MG capsule Take 1 capsule (300 mg total) by mouth at bedtime.  30 capsule  5  . hydrochlorothiazide (HYDRODIURIL) 25 MG tablet Take 1 tablet (25 mg total) by mouth daily.  30 tablet  5  . loratadine (CLARITIN) 10 MG tablet Take 10 mg by mouth daily.      . pantoprazole (PROTONIX) 40 MG tablet Take 1 tablet (40 mg total) by mouth daily.  30 tablet  5  . potassium chloride (K-DUR,KLOR-CON) 10 MEQ tablet Take 1 tablet (10 mEq total) by mouth 2 (two) times daily.  60 tablet  5  . Red Yeast Rice Extract (RED YEAST RICE PO) Take 600 mg by mouth. Two twice a day      . sodium chloride (OCEAN) 0.65 % nasal spray Place 1 spray into the nose as needed for congestion.       No facility-administered medications prior to visit.   PHYSICAL EXAM  Filed Vitals:   01/13/14 0950  BP: 125/65  Pulse: 53  Weight: 154 lb 8 oz (  70.081 kg)   Body mass index is 23.83 kg/(m^2). No exam data present  Generalized: Well developed, in no acute distress  Head: normocephalic and atraumatic. Oropharynx benign  Neck: Supple, no carotid bruits  Cardiac: Regular rate and rhythm, 3/6 systolic murmur radiates to clavicle, no carotid bruits  Musculoskeletal: No deformity   Neurological examination  Mentation: Alert oriented to time, place, history taking. Follows all commands speech and language fluent Cranial nerve II-XII: Fundoscopic exam reveals sharp disc margins. Pupils were equal round reactive to light extraocular movements were full, visual field were full on confrontational test.  Facial sensation and strength were normal. hearing was intact to finger rubbing bilaterally. Uvula tongue midline. head turning and shoulder shrug and were normal and symmetric.Tongue protrusion into cheek strength was normal. Motor: The motor testing reveals 5 over 5 strength of all 4 extremities. Good symmetric motor tone is noted throughout. Left orbits right extremity. Sensory: Sensory testing is intact to soft touch on all 4 extremities. No evidence of extinction is noted.  Coordination: Cerebellar testing reveals good finger-nose-finger and heel-to-shin bilaterally.  Gait and station: Gait is normal. Tandem gait is normal. Romberg is negative.  Reflexes: Deep tendon reflexes are symmetric and normal bilaterally.   ASSESSMENT AND PLAN Becky Baird is a 80 year old Caucasian female with history of R MCA branch infarct on Nov 2010 with R ICA stenosis s/p randomization into SAMMPRIS trial with PTA stent 06/11/09. She has done well with no recurrent stroke symptoms or residual deficits.  Continue aspirin 81 mg orally every day for secondary stroke prevention and maintain strict control of hypertension with blood pressure goal below 130/90, and lipids with LDL cholesterol goal below 100 mg/dL.  Continue healthy diet and regular exercise, goal 30 minutes 4-5 days per week.  Return for follow up in about 6 months, sooner as needed.  Return in about 6 months (around 07/15/2014) for stroke followup.  Philmore Pali, MSN, NP-C 01/13/2014, 10:43 AM [facility] [address] [place], [place] [address] [phone]  Note: This document was prepared with digital dictation and possible smart phrase technology. Any transcriptional errors that result from this process are unintentional.

## 2014-01-14 ENCOUNTER — Ambulatory Visit (HOSPITAL_COMMUNITY)
Admission: RE | Admit: 2014-01-14 | Discharge: 2014-01-14 | Disposition: A | Discharge status: Home / Self Care | Payer: Medicare Other | Source: Ambulatory Visit | Attending: Family Medicine | Admitting: Family Medicine

## 2014-01-14 ENCOUNTER — Encounter: Payer: Self-pay | Admitting: Family Medicine

## 2014-01-14 DIAGNOSIS — M8580 Other specified disorders of bone density and structure, unspecified site: Secondary | ICD-10-CM | POA: Insufficient documentation

## 2014-01-14 DIAGNOSIS — Z13820 Encounter for screening for osteoporosis: Secondary | ICD-10-CM | POA: Diagnosis not present

## 2014-01-14 DIAGNOSIS — M899 Disorder of bone, unspecified: Secondary | ICD-10-CM | POA: Diagnosis not present

## 2014-01-14 DIAGNOSIS — Z780 Asymptomatic menopausal state: Secondary | ICD-10-CM | POA: Diagnosis not present

## 2014-01-14 DIAGNOSIS — M949 Disorder of cartilage, unspecified: Secondary | ICD-10-CM | POA: Diagnosis not present

## 2014-01-15 NOTE — Progress Notes (Signed)
Patient notified and verbalized understanding. 

## 2014-01-24 NOTE — Progress Notes (Signed)
I agree with above 

## 2014-02-14 DIAGNOSIS — D631 Anemia in chronic kidney disease: Secondary | ICD-10-CM | POA: Diagnosis not present

## 2014-02-14 DIAGNOSIS — M948X9 Other specified disorders of cartilage, unspecified sites: Secondary | ICD-10-CM | POA: Diagnosis not present

## 2014-02-14 DIAGNOSIS — N183 Chronic kidney disease, stage 3 unspecified: Secondary | ICD-10-CM | POA: Diagnosis not present

## 2014-02-14 DIAGNOSIS — N189 Chronic kidney disease, unspecified: Secondary | ICD-10-CM | POA: Diagnosis not present

## 2014-02-19 DIAGNOSIS — I10 Essential (primary) hypertension: Secondary | ICD-10-CM | POA: Diagnosis not present

## 2014-02-19 DIAGNOSIS — N183 Chronic kidney disease, stage 3 unspecified: Secondary | ICD-10-CM | POA: Diagnosis not present

## 2014-02-19 DIAGNOSIS — N039 Chronic nephritic syndrome with unspecified morphologic changes: Secondary | ICD-10-CM | POA: Diagnosis not present

## 2014-02-19 DIAGNOSIS — D631 Anemia in chronic kidney disease: Secondary | ICD-10-CM | POA: Diagnosis not present

## 2014-02-19 DIAGNOSIS — M948X9 Other specified disorders of cartilage, unspecified sites: Secondary | ICD-10-CM | POA: Diagnosis not present

## 2014-04-01 DIAGNOSIS — Z23 Encounter for immunization: Secondary | ICD-10-CM | POA: Diagnosis not present

## 2014-04-15 DIAGNOSIS — Z9889 Other specified postprocedural states: Secondary | ICD-10-CM | POA: Diagnosis not present

## 2014-04-15 DIAGNOSIS — H47099 Other disorders of optic nerve, not elsewhere classified, unspecified eye: Secondary | ICD-10-CM | POA: Diagnosis not present

## 2014-04-15 DIAGNOSIS — H01029 Squamous blepharitis unspecified eye, unspecified eyelid: Secondary | ICD-10-CM | POA: Diagnosis not present

## 2014-04-15 DIAGNOSIS — Z961 Presence of intraocular lens: Secondary | ICD-10-CM | POA: Diagnosis not present

## 2014-04-15 DIAGNOSIS — H04129 Dry eye syndrome of unspecified lacrimal gland: Secondary | ICD-10-CM | POA: Diagnosis not present

## 2014-04-24 LAB — HM DIABETES EYE EXAM

## 2014-05-08 ENCOUNTER — Encounter: Payer: Self-pay | Admitting: Neurology

## 2014-05-19 ENCOUNTER — Telehealth: Payer: Self-pay | Admitting: Family Medicine

## 2014-05-19 DIAGNOSIS — E559 Vitamin D deficiency, unspecified: Secondary | ICD-10-CM

## 2014-05-19 DIAGNOSIS — R7303 Prediabetes: Secondary | ICD-10-CM

## 2014-05-19 DIAGNOSIS — E785 Hyperlipidemia, unspecified: Secondary | ICD-10-CM

## 2014-05-19 DIAGNOSIS — N289 Disorder of kidney and ureter, unspecified: Secondary | ICD-10-CM

## 2014-05-19 NOTE — Telephone Encounter (Signed)
Daughter wants to know if she needs bw for her appt on 11/17?  Last labs done 10/27/13 Lipid, A1C, BMP

## 2014-05-19 NOTE — Telephone Encounter (Signed)
Orders ready. Pt notified.  

## 2014-05-19 NOTE — Telephone Encounter (Signed)
Lipid, metabolic 7, vitamin D level, hemoglobin A1c-hyperlipidemia renal insufficiency and vitamin D deficiency and prediabetes keep office visit

## 2014-05-20 ENCOUNTER — Ambulatory Visit: Payer: Medicare Other | Admitting: Family Medicine

## 2014-05-21 ENCOUNTER — Other Ambulatory Visit: Payer: Self-pay | Admitting: *Deleted

## 2014-05-21 MED ORDER — ALPRAZOLAM 0.5 MG PO TABS: ORAL_TABLET | ORAL | Status: DC

## 2014-05-21 MED ORDER — HYDROCHLOROTHIAZIDE 25 MG PO TABS: 25.0000 mg | ORAL_TABLET | Freq: Every day | ORAL | Status: DC

## 2014-05-21 MED ORDER — POTASSIUM CHLORIDE CRYS ER 10 MEQ PO TBCR: 10.0000 meq | EXTENDED_RELEASE_TABLET | Freq: Two times a day (BID) | ORAL | Status: DC

## 2014-05-21 MED ORDER — GABAPENTIN 300 MG PO CAPS: 300.0000 mg | ORAL_CAPSULE | Freq: Every day | ORAL | Status: DC

## 2014-05-21 MED ORDER — PANTOPRAZOLE SODIUM 40 MG PO TBEC: 40.0000 mg | DELAYED_RELEASE_TABLET | Freq: Every day | ORAL | Status: DC

## 2014-05-27 ENCOUNTER — Ambulatory Visit: Payer: Medicare Other | Admitting: Family Medicine

## 2014-05-29 DIAGNOSIS — R7309 Other abnormal glucose: Secondary | ICD-10-CM | POA: Diagnosis not present

## 2014-05-29 DIAGNOSIS — N289 Disorder of kidney and ureter, unspecified: Secondary | ICD-10-CM | POA: Diagnosis not present

## 2014-05-29 DIAGNOSIS — E559 Vitamin D deficiency, unspecified: Secondary | ICD-10-CM | POA: Diagnosis not present

## 2014-05-29 DIAGNOSIS — E785 Hyperlipidemia, unspecified: Secondary | ICD-10-CM | POA: Diagnosis not present

## 2014-05-29 LAB — BASIC METABOLIC PANEL
BUN: 15 mg/dL (ref 6–23)
CO2: 28 meq/L (ref 19–32)
Calcium: 10.1 mg/dL (ref 8.4–10.5)
Chloride: 98 mEq/L (ref 96–112)
Creat: 1.21 mg/dL — ABNORMAL HIGH (ref 0.50–1.10)
Glucose, Bld: 84 mg/dL (ref 70–99)
Potassium: 4.2 mEq/L (ref 3.5–5.3)
Sodium: 136 mEq/L (ref 135–145)

## 2014-05-29 LAB — LIPID PANEL
Cholesterol: 177 mg/dL (ref 0–200)
HDL: 52 mg/dL (ref >39)
LDL Cholesterol: 97 mg/dL (ref 0–99)
TRIGLYCERIDES: 138 mg/dL (ref <150)
Total CHOL/HDL Ratio: 3.4 Ratio
VLDL: 28 mg/dL (ref 0–40)

## 2014-05-29 LAB — HEMOGLOBIN A1C
Hgb A1c MFr Bld: 6.1 % — ABNORMAL HIGH (ref <5.7)
Mean Plasma Glucose: 128 mg/dL — ABNORMAL HIGH (ref <117)

## 2014-05-30 LAB — VITAMIN D 25 HYDROXY (VIT D DEFICIENCY, FRACTURES): Vit D, 25-Hydroxy: 50 ng/mL (ref 30–89)

## 2014-06-10 ENCOUNTER — Ambulatory Visit (INDEPENDENT_AMBULATORY_CARE_PROVIDER_SITE_OTHER): Payer: Medicare Other | Admitting: Family Medicine

## 2014-06-10 ENCOUNTER — Encounter: Payer: Self-pay | Admitting: Family Medicine

## 2014-06-10 VITALS — BP 128/68 | Ht 67.5 in | Wt 157.0 lb

## 2014-06-10 DIAGNOSIS — I10 Essential (primary) hypertension: Secondary | ICD-10-CM

## 2014-06-10 DIAGNOSIS — R7309 Other abnormal glucose: Secondary | ICD-10-CM | POA: Diagnosis not present

## 2014-06-10 DIAGNOSIS — R7303 Prediabetes: Secondary | ICD-10-CM

## 2014-06-10 DIAGNOSIS — N289 Disorder of kidney and ureter, unspecified: Secondary | ICD-10-CM

## 2014-06-10 DIAGNOSIS — I639 Cerebral infarction, unspecified: Secondary | ICD-10-CM | POA: Diagnosis not present

## 2014-06-10 DIAGNOSIS — D509 Iron deficiency anemia, unspecified: Secondary | ICD-10-CM

## 2014-06-10 NOTE — Patient Instructions (Signed)
DASH Eating Plan °DASH stands for "Dietary Approaches to Stop Hypertension." The DASH eating plan is a healthy eating plan that has been shown to reduce high blood pressure (hypertension). Additional health benefits may include reducing the risk of type 2 diabetes mellitus, heart disease, and stroke. The DASH eating plan may also help with weight loss. °WHAT DO I NEED TO KNOW ABOUT THE DASH EATING PLAN? °For the DASH eating plan, you will follow these general guidelines: °· Choose foods with a percent daily value for sodium of less than 5% (as listed on the food label). °· Use salt-free seasonings or herbs instead of table salt or sea salt. °· Check with your health care provider or pharmacist before using salt substitutes. °· Eat lower-sodium products, often labeled as "lower sodium" or "no salt added." °· Eat fresh foods. °· Eat more vegetables, fruits, and low-fat dairy products. °· Choose whole grains. Look for the word "whole" as the first word in the ingredient list. °· Choose fish and skinless chicken or turkey more often than red meat. Limit fish, poultry, and meat to 6 oz (170 g) each day. °· Limit sweets, desserts, sugars, and sugary drinks. °· Choose heart-healthy fats. °· Limit cheese to 1 oz (28 g) per day. °· Eat more home-cooked food and less restaurant, buffet, and fast food. °· Limit fried foods. °· Cook foods using methods other than frying. °· Limit canned vegetables. If you do use them, rinse them well to decrease the sodium. °· When eating at a restaurant, ask that your food be prepared with less salt, or no salt if possible. °WHAT FOODS CAN I EAT? °Seek help from a dietitian for individual calorie needs. °Grains °Whole grain or whole wheat bread. Brown rice. Whole grain or whole wheat pasta. Quinoa, bulgur, and whole grain cereals. Low-sodium cereals. Corn or whole wheat flour tortillas. Whole grain cornbread. Whole grain crackers. Low-sodium crackers. °Vegetables °Fresh or frozen vegetables  (raw, steamed, roasted, or grilled). Low-sodium or reduced-sodium tomato and vegetable juices. Low-sodium or reduced-sodium tomato sauce and paste. Low-sodium or reduced-sodium canned vegetables.  °Fruits °All fresh, canned (in natural juice), or frozen fruits. °Meat and Other Protein Products °Ground beef (85% or leaner), grass-fed beef, or beef trimmed of fat. Skinless chicken or turkey. Ground chicken or turkey. Pork trimmed of fat. All fish and seafood. Eggs. Dried beans, peas, or lentils. Unsalted nuts and seeds. Unsalted canned beans. °Dairy °Low-fat dairy products, such as skim or 1% milk, 2% or reduced-fat cheeses, low-fat ricotta or cottage cheese, or plain low-fat yogurt. Low-sodium or reduced-sodium cheeses. °Fats and Oils °Tub margarines without trans fats. Light or reduced-fat mayonnaise and salad dressings (reduced sodium). Avocado. Safflower, olive, or canola oils. Natural peanut or almond butter. °Other °Unsalted popcorn and pretzels. °The items listed above may not be a complete list of recommended foods or beverages. Contact your dietitian for more options. °WHAT FOODS ARE NOT RECOMMENDED? °Grains °White bread. White pasta. White rice. Refined cornbread. Bagels and croissants. Crackers that contain trans fat. °Vegetables °Creamed or fried vegetables. Vegetables in a cheese sauce. Regular canned vegetables. Regular canned tomato sauce and paste. Regular tomato and vegetable juices. °Fruits °Dried fruits. Canned fruit in light or heavy syrup. Fruit juice. °Meat and Other Protein Products °Fatty cuts of meat. Ribs, chicken wings, bacon, sausage, bologna, salami, chitterlings, fatback, hot dogs, bratwurst, and packaged luncheon meats. Salted nuts and seeds. Canned beans with salt. °Dairy °Whole or 2% milk, cream, half-and-half, and cream cheese. Whole-fat or sweetened yogurt. Full-fat   cheeses or blue cheese. Nondairy creamers and whipped toppings. Processed cheese, cheese spreads, or cheese  curds. °Condiments °Onion and garlic salt, seasoned salt, table salt, and sea salt. Canned and packaged gravies. Worcestershire sauce. Tartar sauce. Barbecue sauce. Teriyaki sauce. Soy sauce, including reduced sodium. Steak sauce. Fish sauce. Oyster sauce. Cocktail sauce. Horseradish. Ketchup and mustard. Meat flavorings and tenderizers. Bouillon cubes. Hot sauce. Tabasco sauce. Marinades. Taco seasonings. Relishes. °Fats and Oils °Butter, stick margarine, lard, shortening, ghee, and bacon fat. Coconut, palm kernel, or palm oils. Regular salad dressings. °Other °Pickles and olives. Salted popcorn and pretzels. °The items listed above may not be a complete list of foods and beverages to avoid. Contact your dietitian for more information. °WHERE CAN I FIND MORE INFORMATION? °National Heart, Lung, and [employer] Institute: www.nhlbi.nih.gov/health/health-topics/topics/dash/ °Document Released: 06/30/2011 Document Revised: 11/25/2013 Document Reviewed: 05/15/2013 °[employer]® Patient Information ©2015 [employer]. This information is not intended to replace advice given to you by your health care provider. Make sure you discuss any questions you have with your health care provider. ° °

## 2014-06-10 NOTE — Progress Notes (Signed)
   Subjective:    Patient ID: Becky Baird, female    DOB: 02-20-1934, 80 y.o.   MRN: [id number]  Hypertension This is a chronic problem. The current episode started more than 1 year ago.   Needs refills on her meds.  Denies chest tightness pressure pain shortness of breath. Denies nausea vomiting diarrhea. States takes medicine denies headaches with a chest pain shortness of breath. Denies neurologic symptoms. Sees her renal insufficiency doctor once a year sees her neurologist twice a year. No concerns. She states she watches her sugars in her diet closely. She states she was placed on iron tablets by the nephrologist she doesn't know why. We will try to get those records. She denies rectal bleeding. She is up-to-date on colonoscopy.  Review of Systems     Objective:   Physical Exam Lungs are clear heart regular pulse normal extremities no edema foot exam normal neurologic normal 25 minutes spent with patient covering multiple items greater than half was spent in discussion      Assessment & Plan:  Renal insufficiency stable HTN good control Hyperlipidemia good control No sign of strokes Questionable anemia check lab work await results Follow-up by springtime

## 2014-06-11 LAB — IRON AND TIBC
%SAT: 26 % (ref 20–55)
IRON: 77 ug/dL (ref 42–145)
TIBC: 291 ug/dL (ref 250–470)
UIBC: 214 ug/dL (ref 125–400)

## 2014-06-11 LAB — FERRITIN: FERRITIN: 82 ng/mL (ref 10–291)

## 2014-06-11 LAB — CBC WITH DIFFERENTIAL/PLATELET
Basophils Absolute: 0.1 10*3/uL (ref 0.0–0.1)
Basophils Relative: 1 % (ref 0–1)
Eosinophils Absolute: 0.3 10*3/uL (ref 0.0–0.7)
Eosinophils Relative: 4 % (ref 0–5)
HCT: 37.6 % (ref 36.0–46.0)
HEMOGLOBIN: 13.1 g/dL (ref 12.0–15.0)
LYMPHS ABS: 2.2 10*3/uL (ref 0.7–4.0)
LYMPHS PCT: 33 % (ref 12–46)
MCH: 30.5 pg (ref 26.0–34.0)
MCHC: 34.8 g/dL (ref 30.0–36.0)
MCV: 87.4 fL (ref 78.0–100.0)
MONOS PCT: 12 % (ref 3–12)
MPV: 8.9 fL — AB (ref 9.4–12.4)
Monocytes Absolute: 0.8 10*3/uL (ref 0.1–1.0)
NEUTROS ABS: 3.4 10*3/uL (ref 1.7–7.7)
NEUTROS PCT: 50 % (ref 43–77)
Platelets: 256 10*3/uL (ref 150–400)
RBC: 4.3 MIL/uL (ref 3.87–5.11)
RDW: 13.6 % (ref 11.5–15.5)
WBC: 6.7 10*3/uL (ref 4.0–10.5)

## 2014-06-13 ENCOUNTER — Encounter: Payer: Self-pay | Admitting: Family Medicine

## 2014-06-17 ENCOUNTER — Ambulatory Visit (INDEPENDENT_AMBULATORY_CARE_PROVIDER_SITE_OTHER): Payer: Medicare Other | Admitting: Family Medicine

## 2014-06-17 ENCOUNTER — Encounter: Payer: Self-pay | Admitting: Family Medicine

## 2014-06-17 VITALS — BP 136/88 | Temp 98.2°F | Ht 67.5 in | Wt 160.0 lb

## 2014-06-17 DIAGNOSIS — R350 Frequency of micturition: Secondary | ICD-10-CM | POA: Diagnosis not present

## 2014-06-17 DIAGNOSIS — I639 Cerebral infarction, unspecified: Secondary | ICD-10-CM | POA: Diagnosis not present

## 2014-06-17 DIAGNOSIS — N3000 Acute cystitis without hematuria: Secondary | ICD-10-CM | POA: Diagnosis not present

## 2014-06-17 LAB — POCT URINALYSIS DIPSTICK
Spec Grav, UA: <=1.005
pH, UA: 6.5

## 2014-06-17 MED ORDER — CEFPROZIL 500 MG PO TABS: 500.0000 mg | ORAL_TABLET | Freq: Two times a day (BID) | ORAL | Status: DC

## 2014-06-17 MED ORDER — CEFTRIAXONE SODIUM 1 G IJ SOLR
500.0000 mg | Freq: Once | INTRAMUSCULAR | Status: AC
  Administered 2014-06-17: 500 mg via INTRAMUSCULAR

## 2014-06-17 NOTE — Progress Notes (Signed)
   Subjective:    Patient ID: Becky Baird, female    DOB: May 02, 1934, 80 y.o.   MRN: [id number]  Urinary Frequency  This is a new problem. Episode onset: Saturday. The problem occurs every urination. Associated symptoms include flank pain and frequency. Associated symptoms comments: dysuria. She has tried nothing for the symptoms.   Some slight confusion today some chills earlier today according to daughter   Review of Systems  Genitourinary: Positive for frequency and flank pain.   Denies abdominal pain denies nausea vomiting    Objective:   Physical Exam Lungs clear heart regular flanks nontender abdomen soft   UA with WBCs    Assessment & Plan:  UTI, slight chills yesterday, slight confusion today, Rocephin shot Cefzil twice a day urine culture pending. If high fevers point pain worse immediately go to ER.

## 2014-06-20 LAB — URINE CULTURE

## 2014-07-28 ENCOUNTER — Ambulatory Visit (INDEPENDENT_AMBULATORY_CARE_PROVIDER_SITE_OTHER): Payer: Medicare Other | Admitting: Family Medicine

## 2014-07-28 ENCOUNTER — Encounter: Payer: Self-pay | Admitting: Family Medicine

## 2014-07-28 VITALS — BP 132/82 | Temp 97.5°F | Ht 67.5 in | Wt 160.0 lb

## 2014-07-28 DIAGNOSIS — R300 Dysuria: Secondary | ICD-10-CM

## 2014-07-28 DIAGNOSIS — D631 Anemia in chronic kidney disease: Secondary | ICD-10-CM | POA: Diagnosis not present

## 2014-07-28 DIAGNOSIS — E889 Metabolic disorder, unspecified: Secondary | ICD-10-CM | POA: Diagnosis not present

## 2014-07-28 DIAGNOSIS — N183 Chronic kidney disease, stage 3 (moderate): Secondary | ICD-10-CM | POA: Diagnosis not present

## 2014-07-28 LAB — POCT URINALYSIS DIPSTICK: pH, UA: 7

## 2014-07-28 MED ORDER — CIPROFLOXACIN HCL 250 MG PO TABS: 250.0000 mg | ORAL_TABLET | Freq: Two times a day (BID) | ORAL | Status: DC

## 2014-07-28 NOTE — Progress Notes (Signed)
   Subjective:    Patient ID: Becky Baird, female    DOB: 27-Apr-1934, 81 y.o.   MRN: [id number]  Dysuria  This is a recurrent problem. The current episode started more than 1 month ago. Associated symptoms include frequency and urgency. Treatments tried: took Cefzil when started in November. Her past medical history is significant for recurrent UTIs.    incr freq and dysuria   No fever no chills no vomiting. Treated for possible pyelonephritis a couple months ago.  In further history patient is had more frequent urination for quite some time now. Also nocturia. Review of Systems  Genitourinary: Positive for dysuria, urgency and frequency.       Objective:   Physical Exam Alert no acute distress. Vitals stable afebrile lungs clear heart regular in rhythm. No CVA tenderness.  UA occasional white blood cell not very impressive       Assessment & Plan:  Impression possible UTI versus worsening of urodynamics. Also referred to nephrologist for mildly elevated creatinine, clinically stable with recent met 7 showing improved results. Plan Cipro 250 twice a day 5 days. Culture urine. Further recommendations based results. Advised patient if discontinues may need other medicine help urination process WSL

## 2014-07-30 LAB — URINE CULTURE
Colony Count: NO GROWTH
Organism ID, Bacteria: NO GROWTH

## 2014-08-04 DIAGNOSIS — I10 Essential (primary) hypertension: Secondary | ICD-10-CM | POA: Diagnosis not present

## 2014-08-04 DIAGNOSIS — D631 Anemia in chronic kidney disease: Secondary | ICD-10-CM | POA: Diagnosis not present

## 2014-08-04 DIAGNOSIS — N183 Chronic kidney disease, stage 3 (moderate): Secondary | ICD-10-CM | POA: Diagnosis not present

## 2014-08-04 DIAGNOSIS — E889 Metabolic disorder, unspecified: Secondary | ICD-10-CM | POA: Diagnosis not present

## 2014-08-13 ENCOUNTER — Ambulatory Visit (INDEPENDENT_AMBULATORY_CARE_PROVIDER_SITE_OTHER): Payer: Medicare Other | Admitting: Neurology

## 2014-08-13 ENCOUNTER — Encounter: Payer: Self-pay | Admitting: Neurology

## 2014-08-13 VITALS — BP 136/68 | HR 56 | Ht 67.5 in | Wt 159.2 lb

## 2014-08-13 DIAGNOSIS — I6990 Unspecified sequelae of unspecified cerebrovascular disease: Secondary | ICD-10-CM | POA: Diagnosis not present

## 2014-08-13 NOTE — Progress Notes (Signed)
PATIENT: Becky Baird DOB: September 01, 1933  REASON FOR VISIT: routine follow up for stroke HISTORY FROM: patient  HISTORY OF PRESENT ILLNESS: [name] is a 81 year old Caucasian female with history of RMCA branch infarct on Nov 2010 with RICA stenosis s/p randomization into SAMMPRIS trial with PTA stent 06/11/09.  01/04/13 Here for follow up visit with repeat carotid dopplers and TCD. Patient reports she is doing well, exercising regularly and working in the yard often including mowing her lawn. Reports some balance instability but no falls.  No new neurological complaints. Feels that short term memory has improved. States that recent cholesterol lab work was good and BP is running 120-130/60-70 at home. Patient denies medication side effects, with no signs of bleeding.  UPDATE 07/01/13 (LL): [name] returns for stroke follow up. Repeat Carotid doppler study in June 2014 was negative for hemodynamically significant stenosis. Repeat TCD's showed mild stenosis, more significant on the right. She reports that she has been well, exercises regularly at [employer]. BP is well controlled and lipids are controlled with red yeast rice. Patient denies medication side effects, with no signs of bleeding.   UPDATE 01/13/14 (LL): Since last visit, patient has been well, no recurrent stroke symptoms.  Recent labs in April show LDL of 114 and Hgb A1c 5.9.  Blood pressure is well controlled, it is 125/65 in the office today.  Last carotid doppler in June 2014 was negative for hemodynamically significant stenosis.   She is tolerating daily aspirin well without significant bleeding or bruising.  Still exercising at aerobics 3x per week.  Update 08/13/2014 : She returns for follow-up after last visit 6 months ago. She continues to do well without recurrent stroke or TIA symptoms since her initial stroke in November 2010. She remains on aspirin which is tolerating well without significant bleeding or  bruising. She states her cholesterol is under good control and she remains on fish oil and red he is rice. She has history of statin intolerance. She had follow-up carotids and transcranial Doppler studies done  On 01/04/2013   which I have personally reviewed and both of which showed no significant intracranial or extracranial stenosis. She is doing well except having had a bladder infection a month ago treated with antibiotics. She has no new complaints today REVIEW OF SYSTEMS: Full 14 system review of systems performed and notable only for: allergies, runny nose,bladder infection, joint pain, neck pain and all other systems negative.  ALLERGIES: Allergies  Allergen Reactions  . Niaspan [Niacin Er]     rash    HOME MEDICATIONS: Outpatient Prescriptions Prior to Visit  Medication Sig Dispense Refill  . ALPRAZolam (XANAX) 0.5 MG tablet Take 1/2 tablet by mouth every 6 hours as needed for anxiety and 1/2 - 1 tablet at bedtime as needed for sleep. 45 tablet 5  . aspirin 81 MG tablet Take 81 mg by mouth. 2 every day    . calcium elemental as carbonate (TUMS ULTRA 1000) 400 MG tablet Chew 1,000 mg by mouth daily.    . fish oil-omega-3 fatty acids 1000 MG capsule Take 2 g by mouth. Two twice a day    . gabapentin (NEURONTIN) 300 MG capsule Take 1 capsule (300 mg total) by mouth at bedtime. 30 capsule 5  . hydrochlorothiazide (HYDRODIURIL) 25 MG tablet Take 1 tablet (25 mg total) by mouth daily. 30 tablet 5  . Iron-FA-B Cmp-C-Biot-Probiotic (FUSION PLUS) CAPS   3  . loratadine (CLARITIN) 10 MG  tablet Take 10 mg by mouth daily.    . pantoprazole (PROTONIX) 40 MG tablet Take 1 tablet (40 mg total) by mouth daily. 30 tablet 5  . potassium chloride (K-DUR,KLOR-CON) 10 MEQ tablet Take 1 tablet (10 mEq total) by mouth 2 (two) times daily. 60 tablet 5  . sodium chloride (OCEAN) 0.65 % nasal spray Place 1 spray into the nose as needed for congestion.    . Cholecalciferol (VITAMIN D-3 PO) Take 2,000 Int'l  Units by mouth daily.    . ciprofloxacin (CIPRO) 250 MG tablet Take 1 tablet (250 mg total) by mouth 2 (two) times daily. 10 tablet 0  . Red Yeast Rice Extract (RED YEAST RICE PO) Take 600 mg by mouth. Two twice a day     No facility-administered medications prior to visit.   PHYSICAL EXAM  Filed Vitals:   08/13/14 1149  BP: 136/68  Pulse: 56  Height: 5' 7.5" (1.715 m)  Weight: 159 lb 3.2 oz (72.213 kg)   Body mass index is 24.55 kg/(m^2). No exam data present  Generalized: Well developed, in no acute distress  Head: normocephalic and atraumatic. Oropharynx benign  Neck: Supple, no carotid bruits  Cardiac: Regular rate and rhythm, 3/6 systolic murmur radiates to clavicle, no carotid bruits  Musculoskeletal: No deformity   Neurological examination  Mentation: Alert oriented to time, place, history taking. Follows all commands speech and language fluent Cranial nerve II-XII: Fundoscopic exam not done  . Pupils were equal round reactive to light extraocular movements were full, visual field were full on confrontational test. Facial sensation and strength were normal. hearing was intact to finger rubbing bilaterally. Uvula tongue midline. head turning and shoulder shrug and were normal and symmetric.Tongue protrusion into cheek strength was normal. Motor: The motor testing reveals 5 over 5 strength of all 4 extremities. Good symmetric motor tone is noted throughout. Left orbits right extremity. Sensory: Sensory testing is intact to soft touch on all 4 extremities. No evidence of extinction is noted.  Coordination: Cerebellar testing reveals good finger-nose-finger and heel-to-shin bilaterally.  Gait and station: Gait is normal. Tandem gait is normal. Romberg is negative.  Reflexes: Deep tendon reflexes are symmetric and normal bilaterally.   ASSESSMENT AND PLAN [name] is a 81 year old Caucasian female with history of R MCA branch infarct on Nov 2010 with R ICA stenosis s/p  randomization into SAMMPRIS trial with PTA stent 06/11/09. She has done well with no recurrent stroke symptoms or residual deficits.   I had a long discussion with the patient and her daughter regarding her remote stroke, risk for stroke recurrence, aggressive risk factor modification and answered questions. Continue aspirin for stroke prevention and strict control of lipids with LDL cholesterol goal below 70 mg percent. Strict control of hypertension with blood pressure goal below 130/90. Return for follow-up in 6 months or call earlier if necessary.   Return in about 6 months (around 02/11/2015).  Antony Contras, MD  08/13/2014, 5:00 PM [facility] [address] [place], [place] [address] [phone]  Note: This document was prepared with digital dictation and possible smart phrase technology. Any transcriptional errors that result from this process are unintentional.

## 2014-08-13 NOTE — Patient Instructions (Signed)
I had a long discussion with the patient and her daughter regarding her remote stroke, risk for stroke recurrence, aggressive risk factor modification and answered questions. Continue aspirin for stroke prevention and strict control of lipids with LDL cholesterol goal below 70 mg percent. Strict control of hypertension with blood pressure goal below 130/90. Return for follow-up in 6 months or call earlier if necessary.

## 2014-11-11 ENCOUNTER — Other Ambulatory Visit: Payer: Self-pay | Admitting: Family Medicine

## 2014-11-11 NOTE — Telephone Encounter (Signed)
1 prescription with one refill needs follow-up

## 2014-11-29 ENCOUNTER — Other Ambulatory Visit: Payer: Self-pay | Admitting: Family Medicine

## 2014-12-01 ENCOUNTER — Ambulatory Visit (INDEPENDENT_AMBULATORY_CARE_PROVIDER_SITE_OTHER): Payer: Medicare Other | Admitting: Cardiology

## 2014-12-01 VITALS — BP 130/70 | HR 51 | Ht 67.0 in | Wt 159.0 lb

## 2014-12-01 DIAGNOSIS — I10 Essential (primary) hypertension: Secondary | ICD-10-CM

## 2014-12-01 DIAGNOSIS — I700 Atherosclerosis of aorta: Secondary | ICD-10-CM

## 2014-12-01 DIAGNOSIS — IMO0001 Reserved for inherently not codable concepts without codable children: Secondary | ICD-10-CM

## 2014-12-01 NOTE — Progress Notes (Signed)
HPI The patient presents for evaluation of a heart murmur.  I saw her last year and she had some mild aortic sclerosis. She also has an EKG which would suggest an old anteroseptal infarct but no wall motion abnormalities on echo last year. She does have bradycardia but no symptoms related to this. Given this last year no further change in therapy or studies were indicated. She returns for follow-up.  The patient denies any new symptoms such as chest discomfort, neck or arm discomfort. There has been no new shortness of breath, PND or orthopnea. There have been no reported palpitations, presyncope or syncope.  She is active and pushes a lawnmower and exercises routinely without symptoms.   Allergies  Allergen Reactions  . Niaspan [Niacin Er]     rash    Current Outpatient Prescriptions  Medication Sig Dispense Refill  . ALPRAZolam (XANAX) 0.5 MG tablet TAKE 1/2 TABLET BY MOUTH EVERY 6 HOURS AS NEEDED FOR ANXIETY & ONE-HALF TO ONE TABLET AT BEDTIME AS NEEDED FOR SLEEP 45 tablet 1  . aspirin 81 MG tablet Take 81 mg by mouth. 2 every day    . calcium elemental as carbonate (TUMS ULTRA 1000) 400 MG tablet Chew 1,000 mg by mouth daily.    . Cholecalciferol 2000 UNITS CAPS Take 2,000 Units by mouth daily.    Marland Kitchen CRANBERRY EXTRACT PO Take 1 tablet by mouth daily.    . fish oil-omega-3 fatty acids 1000 MG capsule Take 2 g by mouth. Two twice a day    . gabapentin (NEURONTIN) 300 MG capsule Take 1 capsule (300 mg total) by mouth at bedtime. 30 capsule 5  . hydrochlorothiazide (HYDRODIURIL) 25 MG tablet Take 1 tablet (25 mg total) by mouth daily. 30 tablet 5  . Iron-FA-B Cmp-C-Biot-Probiotic (FUSION PLUS) CAPS   3  . loratadine (CLARITIN) 10 MG tablet Take 10 mg by mouth daily.    . pantoprazole (PROTONIX) 40 MG tablet Take 1 tablet (40 mg total) by mouth daily. 30 tablet 5  . potassium chloride (K-DUR,KLOR-CON) 10 MEQ tablet Take 1 tablet (10 mEq total) by mouth 2 (two) times daily. 60 tablet 5  .  Red Yeast Rice 600 MG CAPS Take 600 mg by mouth 2 (two) times daily.    . sodium chloride (OCEAN) 0.65 % nasal spray Place 1 spray into the nose as needed for congestion.    . [DISCONTINUED] potassium chloride (K-DUR) 10 MEQ tablet Take 1 tablet (10 mEq total) by mouth 2 (two) times daily. 60 tablet 4   No current facility-administered medications for this visit.    Past Medical History  Diagnosis Date  . Stroke     2010, RMCA with stent  . Anxiety   . Hypertension   . Hyperlipidemia   . Renal cyst   . Ulcer   . Occlusion and stenosis of carotid artery with cerebral infarction     Bilateral less than 50% carotid stenosis.  . Mild cognitive impairment, so stated   . Memory loss     Past Surgical History  Procedure Laterality Date  . Tonsillectomy      childhood  . Dilation and curettage of uterus      years ago  . Ovarian cyst removal      at least 10 years ago  . Colonoscopy  03/10/2011    Procedure: COLONOSCOPY;  Surgeon: Rogene Houston, MD;  Location: [facility];  Service: Endoscopy;  Laterality: N/A;  . Brain stent  ROS:  As stated in the HPI and negative for all other systems.  PHYSICAL EXAM BP 130/70 mmHg  Ht 5\' 7"  (1.702 m)  Wt 159 lb (72.122 kg)  BMI 24.90 kg/m2 GENERAL:  Well appearing NECK:  No jugular venous distention, waveform within normal limits, carotid upstroke brisk and symmetric, no bruits (transmitted murmur), no thyromegaly LUNGS:  Clear to auscultation bilaterally CHEST:  Unremarkable HEART:  PMI not displaced or sustained,S1 and S2 within normal limits, no S3, no S4, no clicks, no rubs, 2/6 apical systolic murmur radiating out the aortic outflow tract, no diastolic murmurs ABD:  Flat, positive bowel sounds normal in frequency in pitch, no bruits, no rebound, no guarding, no midline pulsatile mass, no hepatomegaly, no splenomegaly EXT:  2 plus pulses throughout, no edema, no cyanosis no clubbing   EKG:  Sinus bradycardia, rate 51,  first-degree AV block, axis within normal limits, poor anterior R wave progression, possible old anteroseptal infarct, no acute ST-T wave changes. 12/01/2014   ASSESSMENT AND PLAN  AORTIC SCLEROSIS:  This Is mild and can be followed clinically. No further imaging is indicated.  ABNORMAL EKG:  This would indicate an old anteroseptal infarct but nothing in her history or echo would suggest this. No further evaluation is indicated.  BRADYCARDIA:  She has a slow heart rhythm but no symptoms related to this. We did discuss this.  She can let me know in the future if she has any symptoms.

## 2014-12-01 NOTE — Patient Instructions (Signed)
Your physician recommends that you schedule a follow-up appointment in: as needed with Dr. Hochrein  

## 2014-12-02 ENCOUNTER — Encounter: Payer: Self-pay | Admitting: Cardiology

## 2014-12-02 DIAGNOSIS — IMO0001 Reserved for inherently not codable concepts without codable children: Secondary | ICD-10-CM | POA: Insufficient documentation

## 2014-12-23 ENCOUNTER — Other Ambulatory Visit: Payer: Self-pay | Admitting: Family Medicine

## 2014-12-23 NOTE — Telephone Encounter (Signed)
Needs office visit.

## 2014-12-24 ENCOUNTER — Telehealth: Payer: Self-pay | Admitting: Family Medicine

## 2014-12-24 DIAGNOSIS — E785 Hyperlipidemia, unspecified: Secondary | ICD-10-CM

## 2014-12-24 MED ORDER — HYDROCHLOROTHIAZIDE 25 MG PO TABS: 25.0000 mg | ORAL_TABLET | Freq: Every day | ORAL | Status: DC

## 2014-12-24 NOTE — Telephone Encounter (Signed)
Pt will need a refill on her BP med, she can't get in here to be seen till  The 23rd of June.   She also wants to know if she needs bw done for this appt?   Last lab 05/29/14 Vit D 25, BMP, Lip               06/10/14 CBC, Ferritin, Iron and TIBC

## 2014-12-24 NOTE — Telephone Encounter (Signed)
May have refill 6, I recommend lipid, metabolic 7, hemoglobin [id number], renal insufficiency, prediabetes

## 2014-12-24 NOTE — Telephone Encounter (Signed)
Lab work orders placed in [employer]. Rx sent electronically to pharmacy. Patient notified.

## 2015-01-15 ENCOUNTER — Ambulatory Visit: Payer: Medicare Other | Admitting: Family Medicine

## 2015-01-17 ENCOUNTER — Other Ambulatory Visit: Payer: Self-pay | Admitting: Family Medicine

## 2015-01-19 NOTE — Telephone Encounter (Signed)
Needs office visit.

## 2015-01-19 NOTE — Telephone Encounter (Signed)
This +2 additional refills, needs follow-up office visit by August

## 2015-01-29 DIAGNOSIS — E785 Hyperlipidemia, unspecified: Secondary | ICD-10-CM | POA: Diagnosis not present

## 2015-01-29 DIAGNOSIS — R7309 Other abnormal glucose: Secondary | ICD-10-CM | POA: Diagnosis not present

## 2015-01-30 LAB — HEMOGLOBIN A1C
Est. average glucose Bld gHb Est-mCnc: 126 mg/dL
Hgb A1c MFr Bld: 6 % — ABNORMAL HIGH (ref 4.8–5.6)

## 2015-01-30 LAB — BASIC METABOLIC PANEL
BUN / CREAT RATIO: 10 — AB (ref 11–26)
BUN: 12 mg/dL (ref 8–27)
CO2: 26 mmol/L (ref 18–29)
Calcium: 10 mg/dL (ref 8.7–10.3)
Chloride: 95 mmol/L — ABNORMAL LOW (ref 97–108)
Creatinine, Ser: 1.22 mg/dL — ABNORMAL HIGH (ref 0.57–1.00)
GFR, EST AFRICAN AMERICAN: 49 mL/min/{1.73_m2} — AB (ref >59)
GFR, EST NON AFRICAN AMERICAN: 42 mL/min/{1.73_m2} — AB (ref >59)
GLUCOSE: 89 mg/dL (ref 65–99)
POTASSIUM: 4.6 mmol/L (ref 3.5–5.2)
Sodium: 137 mmol/L (ref 134–144)

## 2015-01-30 LAB — LIPID PANEL
Chol/HDL Ratio: 3.5 ratio units (ref 0.0–4.4)
Cholesterol, Total: 187 mg/dL (ref 100–199)
HDL: 54 mg/dL (ref >39)
LDL Calculated: 110 mg/dL — ABNORMAL HIGH (ref 0–99)
Triglycerides: 116 mg/dL (ref 0–149)
VLDL Cholesterol Cal: 23 mg/dL (ref 5–40)

## 2015-02-06 ENCOUNTER — Ambulatory Visit (INDEPENDENT_AMBULATORY_CARE_PROVIDER_SITE_OTHER): Payer: Medicare Other | Admitting: Family Medicine

## 2015-02-06 ENCOUNTER — Encounter: Payer: Self-pay | Admitting: Family Medicine

## 2015-02-06 VITALS — BP 130/82 | Ht 67.5 in | Wt 158.8 lb

## 2015-02-06 DIAGNOSIS — E785 Hyperlipidemia, unspecified: Secondary | ICD-10-CM | POA: Diagnosis not present

## 2015-02-06 DIAGNOSIS — R7309 Other abnormal glucose: Secondary | ICD-10-CM

## 2015-02-06 DIAGNOSIS — I10 Essential (primary) hypertension: Secondary | ICD-10-CM | POA: Diagnosis not present

## 2015-02-06 DIAGNOSIS — K219 Gastro-esophageal reflux disease without esophagitis: Secondary | ICD-10-CM | POA: Insufficient documentation

## 2015-02-06 DIAGNOSIS — R7303 Prediabetes: Secondary | ICD-10-CM

## 2015-02-06 MED ORDER — LORATADINE 10 MG PO TABS: 10.0000 mg | ORAL_TABLET | Freq: Every day | ORAL | Status: AC

## 2015-02-06 MED ORDER — RANITIDINE HCL 300 MG PO TABS: 300.0000 mg | ORAL_TABLET | Freq: Every day | ORAL | Status: DC

## 2015-02-06 MED ORDER — GABAPENTIN 300 MG PO CAPS: 300.0000 mg | ORAL_CAPSULE | Freq: Every day | ORAL | Status: DC

## 2015-02-06 MED ORDER — POTASSIUM CHLORIDE CRYS ER 10 MEQ PO TBCR: 10.0000 meq | EXTENDED_RELEASE_TABLET | Freq: Two times a day (BID) | ORAL | Status: DC

## 2015-02-06 MED ORDER — HYDROCHLOROTHIAZIDE 25 MG PO TABS: 25.0000 mg | ORAL_TABLET | Freq: Every day | ORAL | Status: DC

## 2015-02-06 NOTE — Progress Notes (Signed)
   Subjective:    Patient ID: Becky Baird, female    DOB: Feb 12, 1934, 81 y.o.   MRN: [id number]  Hypertension This is a chronic problem. The current episode started more than 1 year ago. Pertinent negatives include no chest pain. Risk factors for coronary artery disease include obesity. Treatments tried: hctz. There are no compliance problems.      denies reflux issues no burning or chest tightness. Denies excessive thirst or urination. States her thinking overall is doing good denies any evidence issues of stroke. She does try to watch her diet she does take her medicines on regular basis we did discuss prediabetes hypertension hyperlipidemia cognitive dysfunction.  Review of Systems  Constitutional: Negative for activity change, appetite change and fatigue.  HENT: Negative for congestion.   Respiratory: Negative for cough.   Cardiovascular: Negative for chest pain.  Gastrointestinal: Negative for abdominal pain.  Endocrine: Negative for polydipsia and polyphagia.  Neurological: Negative for weakness.  Psychiatric/Behavioral: Negative for confusion.       Objective:   Physical Exam  Constitutional: She appears well-nourished. No distress.  Cardiovascular: Normal rate, regular rhythm and normal heart sounds.   No murmur heard. Pulmonary/Chest: Effort normal and breath sounds normal. No respiratory distress.  Musculoskeletal: She exhibits no edema.  Lymphadenopathy:    She has no cervical adenopathy.  Neurological: She is alert. She exhibits normal muscle tone.  Psychiatric: Her behavior is normal.  Vitals reviewed.         Assessment & Plan:  1. Essential hypertension  blood pressure under good control continue current measures  2. Prediabetes  prediabetes stable avoid excessive starches  3. Hyperlipidemia  LDL above goal but she does not tolerate statins continue red rice yeast extract besides watch diet  4. Gastroesophageal reflux disease without esophagitis  2  try Zantac instead of PPI. Too many concerns of osteoporosis and dementia yeah with PPI she will notify us if ongoing troubles with reflux

## 2015-02-10 DIAGNOSIS — I63331 Cerebral infarction due to thrombosis of right posterior cerebral artery: Secondary | ICD-10-CM | POA: Diagnosis not present

## 2015-02-10 DIAGNOSIS — H02834 Dermatochalasis of left upper eyelid: Secondary | ICD-10-CM | POA: Diagnosis not present

## 2015-02-10 DIAGNOSIS — Z961 Presence of intraocular lens: Secondary | ICD-10-CM | POA: Diagnosis not present

## 2015-02-10 DIAGNOSIS — H02831 Dermatochalasis of right upper eyelid: Secondary | ICD-10-CM | POA: Diagnosis not present

## 2015-02-11 DIAGNOSIS — L900 Lichen sclerosus et atrophicus: Secondary | ICD-10-CM | POA: Diagnosis not present

## 2015-02-11 DIAGNOSIS — R300 Dysuria: Secondary | ICD-10-CM | POA: Diagnosis not present

## 2015-02-23 DIAGNOSIS — L900 Lichen sclerosus et atrophicus: Secondary | ICD-10-CM | POA: Diagnosis not present

## 2015-03-02 ENCOUNTER — Telehealth: Payer: Self-pay | Admitting: Neurology

## 2015-03-02 ENCOUNTER — Other Ambulatory Visit: Payer: Self-pay | Admitting: Family Medicine

## 2015-03-02 DIAGNOSIS — Z1231 Encounter for screening mammogram for malignant neoplasm of breast: Secondary | ICD-10-CM

## 2015-03-02 NOTE — Telephone Encounter (Signed)
Becky Baird is returning call re: moving up patient's appt

## 2015-03-03 ENCOUNTER — Encounter: Payer: Self-pay | Admitting: Nurse Practitioner

## 2015-03-03 ENCOUNTER — Ambulatory Visit (INDEPENDENT_AMBULATORY_CARE_PROVIDER_SITE_OTHER): Payer: Medicare Other | Admitting: Nurse Practitioner

## 2015-03-03 VITALS — BP 133/60 | HR 53 | Ht 67.0 in | Wt 155.6 lb

## 2015-03-03 DIAGNOSIS — R413 Other amnesia: Secondary | ICD-10-CM

## 2015-03-03 DIAGNOSIS — G3184 Mild cognitive impairment, so stated: Secondary | ICD-10-CM | POA: Diagnosis not present

## 2015-03-03 DIAGNOSIS — E785 Hyperlipidemia, unspecified: Secondary | ICD-10-CM

## 2015-03-03 DIAGNOSIS — Z8673 Personal history of transient ischemic attack (TIA), and cerebral infarction without residual deficits: Secondary | ICD-10-CM | POA: Diagnosis not present

## 2015-03-03 NOTE — Patient Instructions (Addendum)
Continue Aspirin at current dose for secondary stroke prevention Strict control of lipids with LDL cholesterol goal below 100.  Strict control of hypertension with blood pressure goal below 130/90 todays reading 133/60 F/U yearly

## 2015-03-03 NOTE — Progress Notes (Addendum)
[facility]  PATIENT: Becky Baird DOB: 1934-07-22   REASON FOR VISIT: Follow-up for history of stroke, mild cognitive impairment HISTORY FROM: Patient and daughter    HISTORY OF PRESENT ILLNESS:[name] is a 81 year old Caucasian female with history of RMCA branch infarct on Nov 2010 with RICA stenosis s/p randomization into SAMMPRIS trial with PTA stent 06/11/09.  01/04/13 Here for follow up visit with repeat carotid dopplers and TCD. Patient reports she is doing well, exercising regularly and working in the yard often including mowing her lawn. Reports some balance instability but no falls.  No new neurological complaints. Feels that short term memory has improved. States that recent cholesterol lab work was good and BP is running 120-130/60-70 at home. Patient denies medication side effects, with no signs of bleeding.  UPDATE 07/01/13 (LL): [name] returns for stroke follow up. Repeat Carotid doppler study in June 2014 was negative for hemodynamically significant stenosis. Repeat TCD's showed mild stenosis, more significant on the right. She reports that she has been well, exercises regularly at [employer]. BP is well controlled and lipids are controlled with red yeast rice. Patient denies medication side effects, with no signs of bleeding.   UPDATE 03/03/2015  Since last visit, patient has been well, no recurrent stroke symptoms. Recent labs in August 2016  show LDL of 110 and Hgb A1c 6.She has a history of statin intolerance. Blood pressure is well controlled, it is 133/60 in the office today. Last carotid doppler in June 2014 was negative for hemodynamically significant stenosis. She is tolerating daily aspirin well without significant bleeding or bruising. Still exercising at aerobics 3x per week. At [employer]. She reports that her mild cognitive impairment stable. She continues to drive without difficulty. She is independent in all  activities of daily living. She returns for reevaluation  REVIEW OF SYSTEMS: Full 14 system review of systems performed and notable only for those listed, all others are neg:  Constitutional: neg  Cardiovascular: neg Ear/Nose/Throat: neg  Skin: neg Eyes: neg Respiratory: neg Gastroitestinal: neg  Hematology/Lymphatic: neg  Endocrine: neg Musculoskeletal:neg Allergy/Immunology: neg Neurological: neg Psychiatric: neg Sleep : neg   ALLERGIES: Allergies  Allergen Reactions  . Niaspan [Niacin Er]     rash    HOME MEDICATIONS: Outpatient Prescriptions Prior to Visit  Medication Sig Dispense Refill  . ALPRAZolam (XANAX) 0.5 MG tablet TAKE 1/2 TABLET BY MOUTH EVERY 6 HOURS AS NEEDED FOR ANXIETY & TAKE 1/2 TO 1 TABLET AT BEDTIME AS NEEDED FOR SLEEP 45 tablet 2  . aspirin 81 MG tablet Take 81 mg by mouth. 2 every day    . calcium elemental as carbonate (TUMS ULTRA 1000) 400 MG tablet Chew 1,000 mg by mouth daily.    . Cholecalciferol 2000 UNITS CAPS Take 2,000 Units by mouth daily.    Marland Kitchen CRANBERRY EXTRACT PO Take 1 tablet by mouth daily.    . fish oil-omega-3 fatty acids 1000 MG capsule Take 2 g by mouth. Two twice a day    . gabapentin (NEURONTIN) 300 MG capsule Take 1 capsule (300 mg total) by mouth at bedtime. 30 capsule 5  . hydrochlorothiazide (HYDRODIURIL) 25 MG tablet Take 1 tablet (25 mg total) by mouth daily. 30 tablet 5  . Iron-FA-B Cmp-C-Biot-Probiotic (FUSION PLUS) CAPS   3  . loratadine (CLARITIN) 10 MG tablet Take 1 tablet (10 mg total) by mouth daily. 30 tablet 5  . potassium chloride (K-DUR,KLOR-CON) 10 MEQ tablet Take 1 tablet (  10 mEq total) by mouth 2 (two) times daily. 60 tablet 5  . ranitidine (ZANTAC) 300 MG tablet Take 1 tablet (300 mg total) by mouth at bedtime. 30 tablet 5  . Red Yeast Rice 600 MG CAPS Take 600 mg by mouth 2 (two) times daily.    . sodium chloride (OCEAN) 0.65 % nasal spray Place 1 spray into the nose as needed for congestion.     No  facility-administered medications prior to visit.    PAST MEDICAL HISTORY: Past Medical History  Diagnosis Date  . Stroke     2010, RMCA with stent  . Anxiety   . Hypertension   . Hyperlipidemia   . Renal cyst   . Ulcer   . Occlusion and stenosis of carotid artery with cerebral infarction     Bilateral less than 50% carotid stenosis.  . Mild cognitive impairment, so stated   . Memory loss     PAST SURGICAL HISTORY: Past Surgical History  Procedure Laterality Date  . Tonsillectomy      childhood  . Dilation and curettage of uterus      years ago  . Ovarian cyst removal      at least 10 years ago  . Colonoscopy  03/10/2011    Procedure: COLONOSCOPY;  Surgeon: Rogene Houston, MD;  [facility];  Service: Endoscopy;  Laterality: N/A;  . Brain stent      FAMILY HISTORY: Family History  Problem Relation Age of Onset  . Diabetes Brother   . Stroke Mother   . Heart failure Father     SOCIAL HISTORY: History   Social History  . Marital Status: Widowed    Spouse Name: N/A  . Number of Children: 2  . Years of Education: College   Occupational History  .     Social History Main Topics  . Smoking status: Never Smoker   . Smokeless tobacco: Never Used  . Alcohol Use: No  . Drug Use: No  . Sexual Activity: Not on file   Other Topics Concern  . Not on file   Social History Narrative   Patient lives at home alone.   Caffeine Use: very little, rarely     PHYSICAL EXAM  Filed Vitals:   03/03/15 1026  BP: 133/60  Pulse: 53  Height: 5\' 7"  (1.702 m)  Weight: 155 lb 9.6 oz (70.58 kg)   Body mass index is 24.36 kg/(m^2). Generalized: Well developed, in no acute distress  Head: normocephalic and atraumatic. Oropharynx benign  Neck: Supple, no carotid bruits  Cardiac: Regular rate and rhythm, 3/6 systolic murmur radiates to clavicle,   Musculoskeletal: No deformity   Neurological examination  Mentation: Alert oriented to time, place, history  taking. Follows all commands speech and language fluent Cranial nerve II-XII: Fundoscopic exam not done . Pupils were equal round reactive to light extraocular movements were full, visual field were full on confrontational test. Facial sensation and strength were normal. hearing was intact to finger rubbing bilaterally. Uvula tongue midline. head turning and shoulder shrug and were normal and symmetric.Tongue protrusion into cheek strength was normal. Motor: The motor testing reveals 5 over 5 strength of all 4 extremities. Good symmetric motor tone is noted throughout. Left orbits right extremity. Sensory: Sensory testing is intact to soft touch on all 4 extremities. No evidence of extinction is noted.  Coordination: Cerebellar testing reveals good finger-nose-finger and heel-to-shin bilaterally.  Gait and station: Gait is normal. Tandem gait is normal. Romberg is  negative.  Reflexes: Deep tendon reflexes are symmetric and normal bilaterally.   DIAGNOSTIC DATA (LABS, IMAGING, TESTING) - I reviewed patient records, labs, notes, testing and imaging myself where available.      Component Value Date/Time   NA 137 01/29/2015 0916   NA 136 05/29/2014 0732   K 4.6 01/29/2015 0916   CL 95* 01/29/2015 0916   CO2 26 01/29/2015 0916   GLUCOSE 89 01/29/2015 0916   GLUCOSE 84 05/29/2014 0732   BUN 12 01/29/2015 0916   BUN 15 05/29/2014 0732   CREATININE 1.22* 01/29/2015 0916   CREATININE 1.21* 05/29/2014 0732   CREATININE 1.48* 02/22/2013 0850   CALCIUM 10.0 01/29/2015 0916   PROT 7.2 03/28/2013 0810   ALBUMIN 4.5 03/28/2013 0810   AST 16 03/28/2013 0810   ALT 10 03/28/2013 0810   ALKPHOS 87 03/28/2013 0810   BILITOT 0.5 03/28/2013 0810   GFRNONAA 42* 01/29/2015 0916   GFRAA 49* 01/29/2015 0916   Lab Results  Component Value Date   CHOL 187 01/29/2015   HDL 54 01/29/2015   LDLCALC 110* 01/29/2015   TRIG 116 01/29/2015   CHOLHDL 3.5 01/29/2015   Lab Results  Component Value Date    HGBA1C 6.0* 01/29/2015    ASSESSMENT AND PLAN  81 y.o. year old female  has a past medical history of Stroke;  Hypertension; Hyperlipidemia;  Mild cognitive impairment, so stated;  The patient is a current patient of Dr.Sethi  who is out of the office today . This note is sent to the work in doctor.     Continue Aspirin at current dose for secondary stroke prevention Strict control of lipids with LDL cholesterol goal below 100. Followed by PCP Strict control of hypertension with blood pressure goal below 130/90 todays reading 133/60 Continue exercise program at [employer] Discussed follow up now with PCP she is stable patient wants to follow up once more with [place]. F/U yearly Dennie Bible, [facility], [facility], APRN  [facility] [address], [place], [place] [address] [phone]  I reviewed the above note and documentation by the Nurse Practitioner and agree with the history, physical exam, assessment and plan as outlined above. I was immediately available for face-to-face consultation. Star Age, MD, PhD [facility])

## 2015-03-05 ENCOUNTER — Emergency Department (HOSPITAL_COMMUNITY): Payer: Medicare Other

## 2015-03-05 ENCOUNTER — Emergency Department (HOSPITAL_COMMUNITY)
Admission: EM | Admit: 2015-03-05 | Discharge: 2015-03-05 | Disposition: A | Discharge status: Home / Self Care | Payer: Medicare Other | Attending: Emergency Medicine | Admitting: Emergency Medicine

## 2015-03-05 ENCOUNTER — Encounter (HOSPITAL_COMMUNITY): Payer: Self-pay | Admitting: Emergency Medicine

## 2015-03-05 DIAGNOSIS — R42 Dizziness and giddiness: Secondary | ICD-10-CM | POA: Diagnosis not present

## 2015-03-05 DIAGNOSIS — Z79899 Other long term (current) drug therapy: Secondary | ICD-10-CM | POA: Diagnosis not present

## 2015-03-05 DIAGNOSIS — Z8673 Personal history of transient ischemic attack (TIA), and cerebral infarction without residual deficits: Secondary | ICD-10-CM | POA: Diagnosis not present

## 2015-03-05 DIAGNOSIS — Z8669 Personal history of other diseases of the nervous system and sense organs: Secondary | ICD-10-CM | POA: Insufficient documentation

## 2015-03-05 DIAGNOSIS — Q6100 Congenital renal cyst, unspecified: Secondary | ICD-10-CM | POA: Diagnosis not present

## 2015-03-05 DIAGNOSIS — R29810 Facial weakness: Secondary | ICD-10-CM | POA: Diagnosis not present

## 2015-03-05 DIAGNOSIS — I10 Essential (primary) hypertension: Secondary | ICD-10-CM | POA: Diagnosis not present

## 2015-03-05 DIAGNOSIS — Z8639 Personal history of other endocrine, nutritional and metabolic disease: Secondary | ICD-10-CM | POA: Insufficient documentation

## 2015-03-05 DIAGNOSIS — Z7982 Long term (current) use of aspirin: Secondary | ICD-10-CM | POA: Insufficient documentation

## 2015-03-05 LAB — URINALYSIS, ROUTINE W REFLEX MICROSCOPIC
Bilirubin Urine: NEGATIVE
Glucose, UA: NEGATIVE mg/dL
HGB URINE DIPSTICK: NEGATIVE
Ketones, ur: NEGATIVE mg/dL
Leukocytes, UA: NEGATIVE
NITRITE: NEGATIVE
PROTEIN: NEGATIVE mg/dL
UROBILINOGEN UA: 0.2 mg/dL (ref 0.0–1.0)
pH: 5.5 (ref 5.0–8.0)

## 2015-03-05 LAB — I-STAT TROPONIN, ED: TROPONIN I, POC: 0.01 ng/mL (ref 0.00–0.08)

## 2015-03-05 LAB — CBC WITH DIFFERENTIAL/PLATELET
Basophils Absolute: 0 10*3/uL (ref 0.0–0.1)
Basophils Relative: 1 % (ref 0–1)
Eosinophils Absolute: 0.1 10*3/uL (ref 0.0–0.7)
Eosinophils Relative: 2 % (ref 0–5)
HCT: 38.5 % (ref 36.0–46.0)
Hemoglobin: 13.3 g/dL (ref 12.0–15.0)
Lymphocytes Relative: 32 % (ref 12–46)
Lymphs Abs: 2.1 10*3/uL (ref 0.7–4.0)
MCH: 31.7 pg (ref 26.0–34.0)
MCHC: 34.5 g/dL (ref 30.0–36.0)
MCV: 91.7 fL (ref 78.0–100.0)
Monocytes Absolute: 0.7 10*3/uL (ref 0.1–1.0)
Monocytes Relative: 11 % (ref 3–12)
NEUTROS ABS: 3.5 10*3/uL (ref 1.7–7.7)
NEUTROS PCT: 54 % (ref 43–77)
PLATELETS: 227 10*3/uL (ref 150–400)
RBC: 4.2 MIL/uL (ref 3.87–5.11)
RDW: 12.2 % (ref 11.5–15.5)
WBC: 6.5 10*3/uL (ref 4.0–10.5)

## 2015-03-05 LAB — COMPREHENSIVE METABOLIC PANEL
ALBUMIN: 4.3 g/dL (ref 3.5–5.0)
ALK PHOS: 75 U/L (ref 38–126)
ALT: 19 U/L (ref 14–54)
ANION GAP: 10 (ref 5–15)
AST: 26 U/L (ref 15–41)
BUN: 14 mg/dL (ref 6–20)
CHLORIDE: 96 mmol/L — AB (ref 101–111)
CO2: 25 mmol/L (ref 22–32)
Calcium: 9.4 mg/dL (ref 8.9–10.3)
Creatinine, Ser: 1.19 mg/dL — ABNORMAL HIGH (ref 0.44–1.00)
GFR calc Af Amer: 49 mL/min — ABNORMAL LOW (ref >60)
GFR calc non Af Amer: 42 mL/min — ABNORMAL LOW (ref >60)
Glucose, Bld: 98 mg/dL (ref 65–99)
Potassium: 3.8 mmol/L (ref 3.5–5.1)
SODIUM: 131 mmol/L — AB (ref 135–145)
Total Bilirubin: 0.6 mg/dL (ref 0.3–1.2)
Total Protein: 7.9 g/dL (ref 6.5–8.1)

## 2015-03-05 MED ORDER — SODIUM CHLORIDE 0.9 % IV BOLUS (SEPSIS)
500.0000 mL | Freq: Once | INTRAVENOUS | Status: AC
  Administered 2015-03-05: 500 mL via INTRAVENOUS

## 2015-03-05 NOTE — ED Notes (Signed)
Dr Roderic Palau at bedside for evaluation.

## 2015-03-05 NOTE — Discharge Instructions (Signed)
Follow up with your md by next week for recheck

## 2015-03-05 NOTE — ED Provider Notes (Signed)
CSN: [id number]     Arrival date & time 03/05/15  1452 History   First MD Initiated Contact with Patient 03/05/15 1520     Chief Complaint  Patient presents with  . Dizziness     (Consider location/radiation/quality/duration/timing/severity/associated sxs/prior Treatment) Patient is a 81 y.o. female presenting with dizziness. The history is provided by the patient (the states she was in the store and felt like a fullness was in her head.  mild dizziness).  Dizziness Quality:  Lightheadedness Severity:  Mild Onset quality:  Gradual Timing:  Intermittent Progression:  Waxing and waning Chronicity:  New Context: not when bending over   Associated symptoms: no chest pain, no diarrhea and no headaches     Past Medical History  Diagnosis Date  . Stroke     2010, RMCA with stent  . Anxiety   . Hypertension   . Hyperlipidemia   . Renal cyst   . Ulcer   . Occlusion and stenosis of carotid artery with cerebral infarction     Bilateral less than 50% carotid stenosis.  . Mild cognitive impairment, so stated   . Memory loss    Past Surgical History  Procedure Laterality Date  . Tonsillectomy      childhood  . Dilation and curettage of uterus      years ago  . Ovarian cyst removal      at least 10 years ago  . Colonoscopy  03/10/2011    Procedure: COLONOSCOPY;  Surgeon: Rogene Houston, MD;  Location: [facility];  Service: Endoscopy;  Laterality: N/A;  . Brain stent     Family History  Problem Relation Age of Onset  . Diabetes Brother   . Stroke Mother   . Heart failure Father    Social History  Substance Use Topics  . Smoking status: Never Smoker   . Smokeless tobacco: Never Used  . Alcohol Use: No   OB History    No data available     Review of Systems  Constitutional: Negative for appetite change and fatigue.  HENT: Negative for congestion, ear discharge and sinus pressure.   Eyes: Negative for discharge.  Respiratory: Negative for cough.   Cardiovascular:  Negative for chest pain.  Gastrointestinal: Negative for abdominal pain and diarrhea.  Genitourinary: Negative for frequency and hematuria.  Musculoskeletal: Negative for back pain.  Skin: Negative for rash.  Neurological: Positive for dizziness. Negative for seizures and headaches.  Psychiatric/Behavioral: Negative for hallucinations.      Allergies  Niaspan  Home Medications   Prior to Admission medications   Medication Sig Start Date End Date Taking? Authorizing Provider  ALPRAZolam (XANAX) 0.5 MG tablet TAKE 1/2 TABLET BY MOUTH EVERY 6 HOURS AS NEEDED FOR ANXIETY & TAKE 1/2 TO 1 TABLET AT BEDTIME AS NEEDED FOR SLEEP 01/19/15   Kathyrn Drown, MD  aspirin 81 MG tablet Take 81 mg by mouth. 2 every day    Historical Provider, MD  calcium elemental as carbonate (TUMS ULTRA 1000) 400 MG tablet Chew 1,000 mg by mouth daily.    Historical Provider, MD  Cholecalciferol 2000 UNITS CAPS Take 2,000 Units by mouth daily.    Historical Provider, MD  clobetasol ointment (TEMOVATE) 0.05 % APPLY TO THE AFFECTED AREA THREE TIMES DAILY FOR FOUR WEEKS 02/11/15   Historical Provider, MD  CRANBERRY EXTRACT PO Take 1 tablet by mouth daily.    Historical Provider, MD  fish oil-omega-3 fatty acids 1000 MG capsule Take 2 g by mouth. Two  twice a day    Historical Provider, MD  gabapentin (NEURONTIN) 300 MG capsule Take 1 capsule (300 mg total) by mouth at bedtime. 02/06/15   Kathyrn Drown, MD  hydrochlorothiazide (HYDRODIURIL) 25 MG tablet Take 1 tablet (25 mg total) by mouth daily. 02/06/15   Kathyrn Drown, MD  Iron-FA-B Cmp-C-Biot-Probiotic (FUSION PLUS) CAPS  05/12/14   Historical Provider, MD  loratadine (CLARITIN) 10 MG tablet Take 1 tablet (10 mg total) by mouth daily. 02/06/15   Kathyrn Drown, MD  potassium chloride (K-DUR,KLOR-CON) 10 MEQ tablet Take 1 tablet (10 mEq total) by mouth 2 (two) times daily. 02/06/15   Kathyrn Drown, MD  ranitidine (ZANTAC) 300 MG tablet Take 1 tablet (300 mg total) by  mouth at bedtime. 02/06/15   Kathyrn Drown, MD  Red Yeast Rice 600 MG CAPS Take 600 mg by mouth 2 (two) times daily.    Historical Provider, MD  sodium chloride (OCEAN) 0.65 % nasal spray Place 1 spray into the nose as needed for congestion.    Historical Provider, MD   BP 137/53 mmHg  Pulse 66  Temp(Src) 97.7 F (36.5 C) (Oral)  Resp 18  Ht 5\' 7"  (1.702 m)  Wt 158 lb (71.668 kg)  BMI 24.74 kg/m2  SpO2 96% Physical Exam  Constitutional: She is oriented to person, place, and time. She appears well-developed.  HENT:  Head: Normocephalic.  Eyes: Conjunctivae and EOM are normal. No scleral icterus.  Neck: Neck supple. No thyromegaly present.  Cardiovascular: Normal rate and regular rhythm.  Exam reveals no gallop and no friction rub.   No murmur heard. Pulmonary/Chest: No stridor. She has no wheezes. She has no rales. She exhibits no tenderness.  Abdominal: She exhibits no distension. There is no tenderness. There is no rebound.  Musculoskeletal: Normal range of motion. She exhibits no edema.  Lymphadenopathy:    She has no cervical adenopathy.  Neurological: She is oriented to person, place, and time. She exhibits normal muscle tone. Coordination normal.  Skin: No rash noted. No erythema.  Psychiatric: She has a normal mood and affect. Her behavior is normal.    ED Course  Procedures (including critical care time) Labs Review Labs Reviewed  COMPREHENSIVE METABOLIC PANEL - Abnormal; Notable for the following:    Sodium 131 (*)    Chloride 96 (*)    Creatinine, Ser 1.19 (*)    GFR calc non Af Amer 42 (*)    GFR calc Af Amer 49 (*)    All other components within normal limits  URINALYSIS, ROUTINE W REFLEX MICROSCOPIC (NOT AT [facility]) - Abnormal; Notable for the following:    Specific Gravity, Urine <1.005 (*)    All other components within normal limits  CBC WITH DIFFERENTIAL/PLATELET  Randolm Idol, ED    Imaging Review Dg Chest 2 View  03/05/2015   CLINICAL DATA:  The  patient experienced dizziness and headache this morning while putting groceries in her car. Left facial droop. Initial encounter.  EXAM: CHEST  2 VIEW  COMPARISON:  PA and lateral chest 06/10/2009.  FINDINGS: The lungs are clear. Heart size is normal. No pneumothorax or pleural effusion. No focal bony abnormality is identified.  IMPRESSION: Negative chest.   Electronically Signed   By: Inge Rise M.D.   On: 03/05/2015 15:48   Ct Head Wo Contrast  03/05/2015   CLINICAL DATA:  Patient with reports of dizziness that started at 1145 this morning while she was putting groceries in her  car. Patient reports driving home from grocery store and called daughter around 1345 to report symptoms. Patient arrives alert/oriented. States that she just "doesn't feel right". Pt with left facial droop, unknown if it is residual from previous stroke affecting left side. H/o stent in Rt MCA in 2010. Grips equal bilaterally, no pronator drift. Speech clear  EXAM: CT HEAD WITHOUT CONTRAST  TECHNIQUE: Contiguous axial images were obtained from the base of the skull through the vertex without intravenous contrast.  COMPARISON:  Brain MRI, 08/17/2009  FINDINGS: Ventricles are normal in configuration. There is ventricular and sulcal enlargement reflecting age related volume loss. Additional volume loss is noted along the right parietal lobe and posterior temporal lobe from an old infarct.  There are no parenchymal masses or mass effect. There is no evidence of a recent infarct. There is a small old infarct involving the right cerebellum.  There are no extra-axial masses or abnormal fluid collections.  There is no intracranial hemorrhage.  There is a right middle cerebral artery stent, stable from the prior MRI.  Visualized sinuses and mastoid air cells are clear.  IMPRESSION: 1. No acute intracranial abnormalities. 2. Age related volume loss. 3. Old right posterior middle cerebral artery territory infarct, stable, and small stable  right cerebellar infarct.   Electronically Signed   By: Lajean Manes M.D.   On: 03/05/2015 15:40   Mr Brain Wo Contrast  03/05/2015   CLINICAL DATA:  81 year old female with acute onset dizziness, weakness. Initial encounter. Previous endovascular treatment of right MCA stenosis with vascular stent.  EXAM: MRI HEAD WITHOUT CONTRAST  TECHNIQUE: Multiplanar, multiecho pulse sequences of the brain and surrounding structures were obtained without intravenous contrast.  COMPARISON:  Head CT without contrast 1532 hours today. Brain MRI 08/17/2009.  FINDINGS: No restricted diffusion or evidence of acute infarction. Moderate size chronic posterior right MCA infarct with mild progression of encephalomalacia and gliosis since 2011. Chronic lacunar infarcts in the right caudate nucleus, and both cerebellar hemispheres. Major intracranial vascular flow voids are stable. Susceptibility artifact related to the right MCA M1 stent.  No midline shift, mass effect, evidence of mass lesion, ventriculomegaly, extra-axial collection or acute intracranial hemorrhage. Cervicomedullary junction and pituitary are within normal limits.  Visualized internal auditory structures appear stable and within normal limits. Minimal mastoid fluid appears inconsequential. Paranasal sinuses are stable and clear. Orbit and scalp soft tissues appear stable and within normal limits. Visualized bone marrow signal is within normal limits. Stable visualized cervical spine.  IMPRESSION: 1.  No acute intracranial abnormality. 2. Advanced chronic ischemic disease, maximal in the posterior right MCA territory where there has been mild progression of gliosis and encephalomalacia since 2011.   Electronically Signed   By: Genevie Ann M.D.   On: 03/05/2015 17:05   I, Kirsten Mckone L, personally reviewed and evaluated these images and lab results as part of my medical decision-making.   EKG Interpretation   Date/Time:  Thursday March 05 2015 15:06:13  EDT Ventricular Rate:  68 PR Interval:  253 QRS Duration: 82 QT Interval:  391 QTC Calculation: 416 R Axis:   13 Text Interpretation:  Sinus or ectopic atrial rhythm Multiple premature  complexes, vent  Prolonged PR interval Anterior infarct, old No  significant change was found Confirmed by Wyvonnia Dusky  MD, STEPHEN [id number]) on  03/05/2015 3:09:23 PM      MDM   Final diagnoses:  Dizziness    Dizziness resolve,  Pt with unremarkable labs,  Mri brain neg. ekg non acute.  Pt improved with fluids.  Dizziness possibly related to heat exhaustion.  Pt with mild hyponatremia.  Pt to follow up with family md tomorrow or beginning of next week for reevaluation    Milton Ferguson, MD 03/05/15 1827

## 2015-03-05 NOTE — ED Notes (Signed)
Patient stated that she was dizzy when she went from a laying to sitting position but "this was a normal daily thing for her".

## 2015-03-05 NOTE — ED Notes (Signed)
Patient with reports of dizziness that started at 1145 this morning while she was putting groceries in her car. Patient reports driving home from grocery store and called daughter around 3 to report symptoms. Patient arrives alert/oriented. States that she just "doesn't feel right". Pt with left facial droop, unknown if it is residual from previous stroke affecting left side. H/o stent in Rt MCA in 2010. Grips equal bilaterally, no pronator drift. Speech clear.

## 2015-03-09 ENCOUNTER — Ambulatory Visit: Payer: Medicare Other | Admitting: Neurology

## 2015-03-09 ENCOUNTER — Ambulatory Visit: Payer: Medicare Other | Admitting: Family Medicine

## 2015-03-12 ENCOUNTER — Ambulatory Visit (HOSPITAL_COMMUNITY)
Admission: RE | Admit: 2015-03-12 | Discharge: 2015-03-12 | Disposition: A | Discharge status: Home / Self Care | Payer: Medicare Other | Source: Ambulatory Visit | Attending: Family Medicine | Admitting: Family Medicine

## 2015-03-12 DIAGNOSIS — Z1231 Encounter for screening mammogram for malignant neoplasm of breast: Secondary | ICD-10-CM | POA: Diagnosis not present

## 2015-03-25 DIAGNOSIS — M9080 Osteopathy in diseases classified elsewhere, unspecified site: Secondary | ICD-10-CM | POA: Diagnosis not present

## 2015-03-25 DIAGNOSIS — N183 Chronic kidney disease, stage 3 (moderate): Secondary | ICD-10-CM | POA: Diagnosis not present

## 2015-03-25 DIAGNOSIS — D631 Anemia in chronic kidney disease: Secondary | ICD-10-CM | POA: Diagnosis not present

## 2015-03-25 DIAGNOSIS — I10 Essential (primary) hypertension: Secondary | ICD-10-CM | POA: Diagnosis not present

## 2015-04-16 DIAGNOSIS — Z23 Encounter for immunization: Secondary | ICD-10-CM | POA: Diagnosis not present

## 2015-05-02 ENCOUNTER — Other Ambulatory Visit: Payer: Self-pay | Admitting: Family Medicine

## 2015-05-04 NOTE — Telephone Encounter (Signed)
May have this and 5 refills

## 2015-07-07 ENCOUNTER — Other Ambulatory Visit: Payer: Self-pay | Admitting: Family Medicine

## 2015-07-14 ENCOUNTER — Other Ambulatory Visit: Payer: Self-pay | Admitting: Family Medicine

## 2015-07-24 ENCOUNTER — Other Ambulatory Visit: Payer: Self-pay | Admitting: Family Medicine

## 2015-08-10 ENCOUNTER — Telehealth: Payer: Self-pay | Admitting: Family Medicine

## 2015-08-10 DIAGNOSIS — E785 Hyperlipidemia, unspecified: Secondary | ICD-10-CM

## 2015-08-10 DIAGNOSIS — I10 Essential (primary) hypertension: Secondary | ICD-10-CM

## 2015-08-10 DIAGNOSIS — R7303 Prediabetes: Secondary | ICD-10-CM

## 2015-08-10 NOTE — Telephone Encounter (Signed)
Does patient need to have blood work done before her appointment tomorrow?

## 2015-08-10 NOTE — Telephone Encounter (Signed)
Blood work ordered in EPIC. Patient notified. 

## 2015-08-10 NOTE — Telephone Encounter (Signed)
Met 7 lipid, liver, A1c

## 2015-08-10 NOTE — Telephone Encounter (Signed)
Patient last labs 7/16- Lipid, Liver, Met 7, HgbA1c

## 2015-08-11 ENCOUNTER — Ambulatory Visit: Payer: Medicare Other | Admitting: Family Medicine

## 2015-08-11 DIAGNOSIS — I10 Essential (primary) hypertension: Secondary | ICD-10-CM | POA: Diagnosis not present

## 2015-08-11 DIAGNOSIS — E785 Hyperlipidemia, unspecified: Secondary | ICD-10-CM | POA: Diagnosis not present

## 2015-08-11 DIAGNOSIS — R7303 Prediabetes: Secondary | ICD-10-CM | POA: Diagnosis not present

## 2015-08-12 LAB — HEPATIC FUNCTION PANEL
ALBUMIN: 4.8 g/dL (ref 3.5–4.8)
ALT: 16 IU/L (ref 0–32)
AST: 27 IU/L (ref 0–40)
Alkaline Phosphatase: 96 IU/L (ref 39–117)
BILIRUBIN, DIRECT: 0.12 mg/dL (ref 0.00–0.40)
Bilirubin Total: 0.5 mg/dL (ref 0.0–1.2)
Total Protein: 7.6 g/dL (ref 6.0–8.5)

## 2015-08-12 LAB — LIPID PANEL
CHOLESTEROL TOTAL: 204 mg/dL — AB (ref 100–199)
Chol/HDL Ratio: 3.3 ratio units (ref 0.0–4.4)
HDL: 61 mg/dL (ref >39)
LDL CALC: 116 mg/dL — AB (ref 0–99)
Triglycerides: 136 mg/dL (ref 0–149)
VLDL Cholesterol Cal: 27 mg/dL (ref 5–40)

## 2015-08-12 LAB — BASIC METABOLIC PANEL
BUN / CREAT RATIO: 13 (ref 11–26)
BUN: 17 mg/dL (ref 8–27)
CALCIUM: 10.3 mg/dL (ref 8.7–10.3)
CO2: 27 mmol/L (ref 18–29)
Chloride: 99 mmol/L (ref 96–106)
Creatinine, Ser: 1.27 mg/dL — ABNORMAL HIGH (ref 0.57–1.00)
GFR, EST AFRICAN AMERICAN: 46 mL/min/{1.73_m2} — AB (ref >59)
GFR, EST NON AFRICAN AMERICAN: 40 mL/min/{1.73_m2} — AB (ref >59)
Glucose: 95 mg/dL (ref 65–99)
POTASSIUM: 4.1 mmol/L (ref 3.5–5.2)
Sodium: 142 mmol/L (ref 134–144)

## 2015-08-12 LAB — HEMOGLOBIN A1C
Est. average glucose Bld gHb Est-mCnc: 123 mg/dL
Hgb A1c MFr Bld: 5.9 % — ABNORMAL HIGH (ref 4.8–5.6)

## 2015-08-18 ENCOUNTER — Ambulatory Visit: Payer: Medicare Other | Admitting: Family Medicine

## 2015-08-21 ENCOUNTER — Ambulatory Visit: Payer: Medicare Other | Admitting: Family Medicine

## 2015-08-26 ENCOUNTER — Ambulatory Visit: Payer: Medicare Other | Admitting: Family Medicine

## 2015-08-27 ENCOUNTER — Encounter: Payer: Self-pay | Admitting: Family Medicine

## 2015-08-27 ENCOUNTER — Ambulatory Visit (INDEPENDENT_AMBULATORY_CARE_PROVIDER_SITE_OTHER): Payer: Medicare Other | Admitting: Family Medicine

## 2015-08-27 VITALS — BP 122/74 | Ht 67.5 in | Wt 158.0 lb

## 2015-08-27 DIAGNOSIS — I10 Essential (primary) hypertension: Secondary | ICD-10-CM

## 2015-08-27 DIAGNOSIS — Z23 Encounter for immunization: Secondary | ICD-10-CM | POA: Diagnosis not present

## 2015-08-27 DIAGNOSIS — N289 Disorder of kidney and ureter, unspecified: Secondary | ICD-10-CM

## 2015-08-27 DIAGNOSIS — R7303 Prediabetes: Secondary | ICD-10-CM

## 2015-08-27 DIAGNOSIS — E785 Hyperlipidemia, unspecified: Secondary | ICD-10-CM | POA: Diagnosis not present

## 2015-08-27 NOTE — Patient Instructions (Signed)
Results for orders placed or performed in visit on 08/10/15  Lipid panel  Result Value Ref Range   Cholesterol, Total 204 (H) 100 - 199 mg/dL   Triglycerides 136 0 - 149 mg/dL   HDL 61 >39 mg/dL   VLDL Cholesterol Cal 27 5 - 40 mg/dL   LDL Calculated 116 (H) 0 - 99 mg/dL   Chol/HDL Ratio 3.3 0.0 - 4.4 ratio units  Hepatic function panel  Result Value Ref Range   Total Protein 7.6 6.0 - 8.5 g/dL   Albumin 4.8 3.5 - 4.8 g/dL   Bilirubin Total 0.5 0.0 - 1.2 mg/dL   Bilirubin, Direct 0.12 0.00 - 0.40 mg/dL   Alkaline Phosphatase 96 39 - 117 IU/L   AST 27 0 - 40 IU/L   ALT 16 0 - 32 IU/L  Basic metabolic panel  Result Value Ref Range   Glucose 95 65 - 99 mg/dL   BUN 17 8 - 27 mg/dL   Creatinine, Ser 1.27 (H) 0.57 - 1.00 mg/dL   GFR calc non Af Amer 40 (L) >59 mL/min/1.73   GFR calc Af Amer 46 (L) >59 mL/min/1.73   BUN/Creatinine Ratio 13 11 - 26   Sodium 142 134 - 144 mmol/L   Potassium 4.1 3.5 - 5.2 mmol/L   Chloride 99 96 - 106 mmol/L   CO2 27 18 - 29 mmol/L   Calcium 10.3 8.7 - 10.3 mg/dL  Hemoglobin A1c  Result Value Ref Range   Hgb A1c MFr Bld 5.9 (H) 4.8 - 5.6 %   Est. average glucose Bld gHb Est-mCnc 123 mg/dL

## 2015-08-27 NOTE — Progress Notes (Signed)
   Subjective:    Patient ID: Becky Baird, female    DOB: 02/09/1934, 82 y.o.   MRN: [id number]  Hyperlipidemia This is a chronic problem. The current episode started more than 1 year ago. Pertinent negatives include no chest pain. There are no compliance problems (exercises three times a week).    Pt states no problems or concerns today.  Patients has history of osteopenia she will get a bone density test in approximately 6 months We did discuss diet exercise the importance of taking a medicine on a regular basis She states her memory is doing well she is functioning well balancing her checkbook able to drive without problems able to fix food take care of her household without difficulty Patient tolerating Neurontin well Uses Xanax at nighttime help sleep Recent lab work reviewed including A1c lipid profile metabolic 7  Review of Systems  Constitutional: Negative for activity change, appetite change and fatigue.  HENT: Negative for congestion.   Respiratory: Negative for cough.   Cardiovascular: Negative for chest pain.  Gastrointestinal: Negative for abdominal pain.  Endocrine: Negative for polydipsia and polyphagia.  Neurological: Negative for weakness.  Psychiatric/Behavioral: Negative for confusion.       Objective:   Physical Exam  Constitutional: She appears well-nourished. No distress.  Cardiovascular: Normal rate, regular rhythm and normal heart sounds.   No murmur heard. Pulmonary/Chest: Effort normal and breath sounds normal. No respiratory distress.  Musculoskeletal: She exhibits no edema.  Lymphadenopathy:    She has no cervical adenopathy.  Neurological: She is alert. She exhibits normal muscle tone.  Psychiatric: Her behavior is normal.  Vitals reviewed.    25 minutes was spent with the patient. Greater than half the time was spent in discussion and answering questions and counseling regarding the issues that the patient came in for today.        Assessment & Plan:  Hyperlipidemia actually under good control unable to tolerate statins but able to tolerate red rice yeast extract so she is to continue this along with healthy eating regular physical activity Renal insufficiency stable recheck 6 months HTN good control Previous history of stroke continue 2 aspirins a day Prediabetes stable Bone density test on follow-up Discussion regarding repeat colonoscopy 6 months,dr rheman

## 2015-09-02 ENCOUNTER — Other Ambulatory Visit: Payer: Self-pay | Admitting: Family Medicine

## 2015-09-21 ENCOUNTER — Other Ambulatory Visit: Payer: Self-pay | Admitting: Family Medicine

## 2015-09-22 DIAGNOSIS — E889 Metabolic disorder, unspecified: Secondary | ICD-10-CM | POA: Diagnosis not present

## 2015-09-22 DIAGNOSIS — N189 Chronic kidney disease, unspecified: Secondary | ICD-10-CM | POA: Diagnosis not present

## 2015-09-22 DIAGNOSIS — N183 Chronic kidney disease, stage 3 (moderate): Secondary | ICD-10-CM | POA: Diagnosis not present

## 2015-10-01 DIAGNOSIS — I10 Essential (primary) hypertension: Secondary | ICD-10-CM | POA: Diagnosis not present

## 2015-10-01 DIAGNOSIS — M9080 Osteopathy in diseases classified elsewhere, unspecified site: Secondary | ICD-10-CM | POA: Diagnosis not present

## 2015-10-01 DIAGNOSIS — N183 Chronic kidney disease, stage 3 (moderate): Secondary | ICD-10-CM | POA: Diagnosis not present

## 2015-10-01 DIAGNOSIS — E889 Metabolic disorder, unspecified: Secondary | ICD-10-CM | POA: Diagnosis not present

## 2015-10-01 DIAGNOSIS — D631 Anemia in chronic kidney disease: Secondary | ICD-10-CM | POA: Diagnosis not present

## 2015-10-05 ENCOUNTER — Encounter: Payer: Self-pay | Admitting: *Deleted

## 2015-10-13 ENCOUNTER — Other Ambulatory Visit: Payer: Self-pay | Admitting: Family Medicine

## 2015-10-23 ENCOUNTER — Other Ambulatory Visit: Payer: Self-pay | Admitting: Family Medicine

## 2015-10-23 NOTE — Telephone Encounter (Signed)
Ok plus 2 ref 

## 2015-11-05 ENCOUNTER — Ambulatory Visit (HOSPITAL_COMMUNITY)
Admission: RE | Admit: 2015-11-05 | Discharge: 2015-11-05 | Disposition: A | Discharge status: Home / Self Care | Payer: Medicare Other | Source: Ambulatory Visit | Attending: Family Medicine | Admitting: Family Medicine

## 2015-11-05 ENCOUNTER — Ambulatory Visit (INDEPENDENT_AMBULATORY_CARE_PROVIDER_SITE_OTHER): Payer: Medicare Other | Admitting: Family Medicine

## 2015-11-05 ENCOUNTER — Encounter: Payer: Self-pay | Admitting: Family Medicine

## 2015-11-05 VITALS — BP 132/84 | Wt 157.0 lb

## 2015-11-05 DIAGNOSIS — M19071 Primary osteoarthritis, right ankle and foot: Secondary | ICD-10-CM | POA: Diagnosis not present

## 2015-11-05 DIAGNOSIS — M2011 Hallux valgus (acquired), right foot: Secondary | ICD-10-CM | POA: Diagnosis not present

## 2015-11-05 DIAGNOSIS — M79671 Pain in right foot: Secondary | ICD-10-CM | POA: Diagnosis not present

## 2015-11-05 NOTE — Progress Notes (Signed)
   Subjective:    Patient ID: Becky Baird, female    DOB: 1934-02-07, 82 y.o.   MRN: [id number]  Foot Pain This is a new problem. Episode onset: 2 and a half weeks. Associated symptoms comments: Right foot pain and swelling . Treatments tried: ice and rest.   Sometime s ago satted mowing   Now wsudden pain hard toime keeping your show on  Some swelling  Sharp pains striking at [employer] and local mmeasure  Put in otn a pilow  Review of Systems No headache, no major weight loss or weight gain, no chest pain no back pain abdominal pain no change in bowel habits complete ROS otherwise negative     Objective:   Physical Exam  Alert vital stable lungs clear heart rare rhythm distal foot tender at the third metatarsal head sensation good pulses good no obvious edema at this time      Assessment & Plan:  Impression foot strain versus stress fracture plan x-ray addendum x-ray normal likely strain the foot 12 anti-inflammatory if persists may need bone scan discussed. See patient recommendations to use proton pump inhibitor when necessary, family called advised negative x-ray 25 minutes spent most in discussion

## 2015-11-05 NOTE — Patient Instructions (Signed)
For the next seven days , take two aleave with food twice per day bfast and supper is fine. During that time, take the protonix to help protect your stomach, If pain persists beyone, get back and see Dr Nicki Reaper and may need a bone scan

## 2015-11-20 ENCOUNTER — Telehealth: Payer: Self-pay | Admitting: Family Medicine

## 2015-11-20 NOTE — Telephone Encounter (Signed)
Notified patient on voicemail.

## 2015-11-20 NOTE — Telephone Encounter (Signed)
Patient came in the office today in reference to a bill she received from [facility] for $66 for DOS: 08/11/15.  Medicare is denying the HgbA1C that she was tested on and unfortunately there are no diagnosis that we can use to get this covered for her.  For future reference, please note that pre-diabetes (R73.03) is not considered medically necessary to have an A1C paid for under CMS.

## 2015-11-23 DIAGNOSIS — S92919A Unspecified fracture of unspecified toe(s), initial encounter for closed fracture: Secondary | ICD-10-CM

## 2015-11-23 HISTORY — DX: Unspecified fracture of unspecified toe(s), initial encounter for closed fracture: S92.919A

## 2015-11-24 ENCOUNTER — Ambulatory Visit (INDEPENDENT_AMBULATORY_CARE_PROVIDER_SITE_OTHER): Payer: Medicare Other | Admitting: Family Medicine

## 2015-11-24 ENCOUNTER — Encounter: Payer: Self-pay | Admitting: Family Medicine

## 2015-11-24 VITALS — BP 114/82 | Ht 67.5 in | Wt 164.2 lb

## 2015-11-24 DIAGNOSIS — M79671 Pain in right foot: Secondary | ICD-10-CM | POA: Diagnosis not present

## 2015-11-24 NOTE — Progress Notes (Signed)
   Subjective:    Patient ID: Becky Baird, female    DOB: Feb 25, 1934, 82 y.o.   MRN: [id number]  HPI Patient is here today for a recheck on her right foot pain. Patient states that the pain has not improved much. Treatments tried: aleve with no relief.   Patient has no other concerns at this time.  Describes some pain discomfort when she walks pain and discomfort when she pushes off mainly underneath foot but some on top of foot some swelling associated with it. Has not got better in the past few weeks. Was taking anti-inflammatories for this.  Review of Systems     Objective:   Physical Exam Calf is normal ankles normal foot there is mild tenderness on the lateral aspect. No exceptional swelling noted.       Assessment & Plan:  Could be metatarsalgia could be stress fracture recommend limited bone scan await the results of this may need referral to podiatry or orthopedics depending on the results of this test  Patient was advised to stop the anti-inflammatory because of her already been on aspirin and potential for also her Family member states patient is having some cognitive processing issues therefore we will discuss this in more depth when she comes in in approximately 6-8 weeks

## 2015-11-27 ENCOUNTER — Encounter (HOSPITAL_COMMUNITY)
Admission: RE | Admit: 2015-11-27 | Discharge: 2015-11-27 | Disposition: A | Discharge status: Home / Self Care | Payer: Medicare Other | Source: Ambulatory Visit | Attending: Family Medicine | Admitting: Family Medicine

## 2015-11-27 ENCOUNTER — Ambulatory Visit (HOSPITAL_COMMUNITY)
Admission: RE | Admit: 2015-11-27 | Discharge: 2015-11-27 | Disposition: A | Discharge status: Home / Self Care | Payer: Medicare Other | Source: Ambulatory Visit | Attending: Family Medicine | Admitting: Family Medicine

## 2015-11-27 ENCOUNTER — Telehealth: Payer: Self-pay | Admitting: Family Medicine

## 2015-11-27 ENCOUNTER — Encounter (HOSPITAL_COMMUNITY): Payer: Self-pay

## 2015-11-27 DIAGNOSIS — R948 Abnormal results of function studies of other organs and systems: Secondary | ICD-10-CM | POA: Diagnosis not present

## 2015-11-27 DIAGNOSIS — M79671 Pain in right foot: Secondary | ICD-10-CM | POA: Diagnosis not present

## 2015-11-27 DIAGNOSIS — S99921A Unspecified injury of right foot, initial encounter: Secondary | ICD-10-CM | POA: Diagnosis not present

## 2015-11-27 MED ORDER — TECHNETIUM TC 99M MEDRONATE IV KIT
25.0000 | PACK | Freq: Once | INTRAVENOUS | Status: AC | PRN
  Administered 2015-11-27: 25 via INTRAVENOUS

## 2015-11-27 NOTE — Telephone Encounter (Signed)
Pt had a nuclear medicine study done today and radiology is suggesting she have an xray of her foot. Daughter is wanting to know if that can be done today. Please advise. Daughter is aware Dr. Nicki Reaper is not in.

## 2015-11-27 NOTE — Telephone Encounter (Signed)
Of course did have xray foot already, but sometimes they like to do serial xrays  So sure if they want

## 2015-11-27 NOTE — Telephone Encounter (Signed)
Spoke with patient and informed her per  Dr.Steve Luking- xray for right foot was ordered. Patient's daughter verbalized understanding.

## 2015-11-30 ENCOUNTER — Encounter: Payer: Self-pay | Admitting: Family Medicine

## 2015-11-30 NOTE — Addendum Note (Signed)
Addended by: Dairl Ponder on: 11/30/2015 09:54 AM   Modules accepted: Orders

## 2015-12-07 ENCOUNTER — Ambulatory Visit (INDEPENDENT_AMBULATORY_CARE_PROVIDER_SITE_OTHER): Payer: Medicare Other | Admitting: Orthopedic Surgery

## 2015-12-07 VITALS — BP 164/73 | HR 64 | Ht 67.5 in | Wt 164.0 lb

## 2015-12-07 DIAGNOSIS — M84374A Stress fracture, right foot, initial encounter for fracture: Secondary | ICD-10-CM | POA: Diagnosis not present

## 2015-12-07 NOTE — Patient Instructions (Signed)
Weightbearing as tolerated with the boot and the cane  Cane in the opposite hand  Do not wear the boot to sleep or for bathing  Return 6 weeks

## 2015-12-07 NOTE — Progress Notes (Signed)
Chief Complaint  Patient presents with  . Foot Pain    Right foot pain    HPI 82 year old female presents with 8-10 week history of pain after mowing her lawn involving her right foot second and third metatarsal. Pain swelling sharp stabbing aching pain constant 6 out of 10 can get to 10 out of 10 partially relieved with a metatarsal hard sole shoe. Did take Aleve 4 months no improvement  Review of systems gait disturbance stiff joints swollen joint back pain joint pain seasonal allergy lightheadedness weakness abdominal pain no chest pain no respiratory shortness of breath  ROS As noted in history of present illness Past Medical History  Diagnosis Date  . Stroke ([place])     2010, RMCA with stent  . Anxiety   . Hypertension   . Hyperlipidemia   . Renal cyst   . Ulcer   . Occlusion and stenosis of carotid artery with cerebral infarction     Bilateral less than 50% carotid stenosis.  . Mild cognitive impairment, so stated   . Memory loss     Past Surgical History  Procedure Laterality Date  . Tonsillectomy      childhood  . Dilation and curettage of uterus      years ago  . Ovarian cyst removal      at least 10 years ago  . Colonoscopy  03/10/2011    Procedure: COLONOSCOPY;  Surgeon: Rogene Houston, MD;  [facility];  Service: Endoscopy;  Laterality: N/A;  . Brain stent     Family History  Problem Relation Age of Onset  . Diabetes Brother   . Stroke Mother   . Heart failure Father    Social History  Substance Use Topics  . Smoking status: Never Smoker   . Smokeless tobacco: Never Used  . Alcohol Use: No    Current outpatient prescriptions:  .  ALPRAZolam (XANAX) 0.5 MG tablet, TAKE 1/2 TABLET BY MOUTH EVERY 6 HOURS AS NEEDED FOR ANXIETY AND TAKE 1/2 TO 1 TABLET AT BEDTIME AS NEEDED FOR SLEEP, Disp: 45 tablet, Rfl: 2 .  aspirin 81 MG tablet, Take 81 mg by mouth. 2 every day, Disp: , Rfl:  .  calcium elemental as carbonate (TUMS ULTRA 1000) 400 MG tablet,  Chew 1,000 mg by mouth daily., Disp: , Rfl:  .  Cholecalciferol 2000 UNITS CAPS, Take 2,000 Units by mouth daily., Disp: , Rfl:  .  clobetasol ointment (TEMOVATE) 0.05 %, APPLY TO THE AFFECTED AREA THREE TIMES DAILY FOR FOUR WEEKS, Disp: , Rfl: 1 .  CRANBERRY EXTRACT PO, Take 1 tablet by mouth daily., Disp: , Rfl:  .  fish oil-omega-3 fatty acids 1000 MG capsule, Take 2 g by mouth. Two twice a day, Disp: , Rfl:  .  gabapentin (NEURONTIN) 300 MG capsule, TAKE ONE CAPSULE BY MOUTH AT BEDTIME, Disp: 30 capsule, Rfl: 5 .  hydrochlorothiazide (HYDRODIURIL) 25 MG tablet, TAKE ONE TABLET BY MOUTH DAILY, Disp: 30 tablet, Rfl: 5 .  Iron-FA-B Cmp-C-Biot-Probiotic (FUSION PLUS) CAPS, , Disp: , Rfl: 3 .  loratadine (CLARITIN) 10 MG tablet, Take 1 tablet (10 mg total) by mouth daily., Disp: 30 tablet, Rfl: 5 .  potassium chloride (K-DUR,KLOR-CON) 10 MEQ tablet, TAKE ONE TABLET BY MOUTH TWICE DAILY, Disp: 60 tablet, Rfl: 5 .  ranitidine (ZANTAC) 300 MG tablet, Take 1 tablet (300 mg total) by mouth at bedtime., Disp: 30 tablet, Rfl: 5 .  ranitidine (ZANTAC) 300 MG tablet, TAKE ONE TABLET BY MOUTH  AT BEDTIME, Disp: 30 tablet, Rfl: 5 .  Red Yeast Rice 600 MG CAPS, Take 600 mg by mouth 2 (two) times daily., Disp: , Rfl:  .  sodium chloride (OCEAN) 0.65 % nasal spray, Place 1 spray into the nose as needed for congestion., Disp: , Rfl:  .  [DISCONTINUED] potassium chloride (K-DUR) 10 MEQ tablet, Take 1 tablet (10 mEq total) by mouth 2 (two) times daily., Disp: 60 tablet, Rfl: 4  BP 164/73 mmHg  Pulse 64  Ht 5' 7.5" (1.715 m)  Wt 164 lb (74.39 kg)  BMI 25.29 kg/m2  Physical Exam  Ortho Exam Tenderness second and third metatarsal plantar and volar normal range of motion foot painful range of motion metatarsophalangeal joints 2 and 3 no instability no atrophy in the foot scans a little bit discolored pulses are good sensation is normal [place] 2 with a limp her mood is pleasant she is oriented 3 her appearance  is normal vital signs are stable above.  Opposite foot normal alignment  ASSESSMENT: My personal interpretation of the images:  Bone scan and x-rays  X-rays are negative for fracture or fracture callus  Bone scan shows inflammatory response in the second metatarsal area near the point of maximal tenderness    PLAN Recommend Cam Walker short cane left hand  6 weeks follow-up clinical exam only

## 2015-12-07 NOTE — Addendum Note (Signed)
Addended by: Baldomero Lamy B on: 12/07/2015 04:39 PM   Modules accepted: Orders

## 2015-12-08 ENCOUNTER — Telehealth: Payer: Self-pay | Admitting: Orthopedic Surgery

## 2015-12-08 ENCOUNTER — Other Ambulatory Visit: Payer: Self-pay | Admitting: *Deleted

## 2015-12-08 DIAGNOSIS — M84374A Stress fracture, right foot, initial encounter for fracture: Secondary | ICD-10-CM

## 2015-12-08 NOTE — Telephone Encounter (Signed)
Order therapy for her  Home if available or out patient

## 2015-12-08 NOTE — Telephone Encounter (Signed)
Patient's daughter and designated contact, Dolan Amen, has concerns and questions regarding mom's walking in boot + with cane per her visit yesterday, 12/07/15 for right foot stress fracture.  States that mom has "un-steadiness" due to history of stroke, and lives alone; therefore, asking if she may receive some type of additional training with boot and cane with a physical therapist or a home health assessment.  States has concerns with possible fall risk.  Please call daughter at [phone]

## 2015-12-08 NOTE — Telephone Encounter (Signed)
Routing to Dr Harrison for approval 

## 2015-12-08 NOTE — Telephone Encounter (Signed)
Order sent to [facility] therapy dept, daughter aware

## 2015-12-10 ENCOUNTER — Ambulatory Visit (HOSPITAL_COMMUNITY): Payer: Medicare Other | Attending: Orthopedic Surgery

## 2015-12-10 DIAGNOSIS — R2681 Unsteadiness on feet: Secondary | ICD-10-CM | POA: Insufficient documentation

## 2015-12-10 DIAGNOSIS — M6281 Muscle weakness (generalized): Secondary | ICD-10-CM | POA: Diagnosis not present

## 2015-12-10 DIAGNOSIS — R2689 Other abnormalities of gait and mobility: Secondary | ICD-10-CM | POA: Insufficient documentation

## 2015-12-10 NOTE — Therapy (Signed)
[place], [place], [address] Phone: [phone]   Fax:  [phone]  Physical Therapy Evaluation  Patient Details  Name: Becky Baird MRN: [id number] Date of Birth: [birth year] Referring Provider: Arther Abbott   Encounter Date: 12/10/2015      PT End of Session - 12/10/15 2310    Visit Number 1   Number of Visits 12   Date for PT Re-Evaluation 01/10/16   Authorization Type Medicare   Authorization Time Period 12/10/15-02/09/16   Authorization - Visit Number 1   PT Start Time 0950   PT Stop Time 1751   PT Time Calculation (min) 55 min   Activity Tolerance Patient tolerated treatment well;No increased pain   Behavior During Therapy [facility] for tasks assessed/performed      Past Medical History  Diagnosis Date  . Stroke ([place])     2010, RMCA with stent  . Anxiety   . Hypertension   . Hyperlipidemia   . Renal cyst   . Ulcer   . Occlusion and stenosis of carotid artery with cerebral infarction     Bilateral less than 50% carotid stenosis.  . Mild cognitive impairment, so stated   . Memory loss     Past Surgical History  Procedure Laterality Date  . Tonsillectomy      childhood  . Dilation and curettage of uterus      years ago  . Ovarian cyst removal      at least 10 years ago  . Colonoscopy  03/10/2011    Procedure: COLONOSCOPY;  Surgeon: Rogene Houston, MD;  Location[facility];  Service: Endoscopy;  Laterality: N/A;  . Brain stent      There were no vitals filed for this visit.       Subjective Assessment - 12/10/15 2245    Subjective Becky Baird is a 82yo white female who reports that 2 months ago, she was cutting the yard with th push mower when she sustained a fall and loss of footing, wherein her foot became swollen and painful. She saught medical attention from her PCP 1 month latera, imaging negative and put on Aleve for 1 month, after which she followed up, was found to have  postiive imaging, and was referred to Dr. Aline Brochure. She then was placed in a boot, asked to avoid putting weight on her foot and to use a cane for unweighting the foot. She has been painfree since donning said boot and is referred to PT for further evaluation. PMH: osteopenia, taking calcium and Vitamin D supplements daily, and historically poor tolerance of NSAIDs.   Pertinent History Osteopenia, CVA 2010 (balance, visual, and cognitive deficits per daughter)   How long can you stand comfortably? 30 minutes   How long can you walk comfortably? 30 minutes in yard    Diagnostic tests Xrays, Bone Scan   Patient Stated Goals Return to normal life, high activity levels.    Multiple Pain Sites No            [facility] PT Assessment - 12/10/15 0001    Assessment   Medical Diagnosis R foot met fractures   Referring Provider Arther Abbott    Onset Date/Surgical Date 10/10/15   Next MD Visit Early July   Prior Therapy None   Precautions   Precautions Other (comment)  Asked to use a [facility]   Required Braces or Orthoses --  Right Aircast boot, excepts for bathing anf sleeping x6w  Restrictions   Weight Bearing Restrictions No   RLE Weight Bearing --  none listed in MD note; pt/family report verbal restrictions   Balance Screen   Has the patient fallen in the past 6 months Yes   How many times? 1   Has the patient had a decrease in activity level because of a fear of falling?  Yes   Is the patient reluctant to leave their home because of a fear of falling?  No   Prior Function   Level of Independence Independent   Vocation Retired   Associate Professor   Overall Cognitive Status History of cognitive impairments - at baseline  self reported impairment from CVA 2010   Sensation   Light Touch Appears Intact   Functional Tests   Functional tests --  Limited appropriateness due to current restrictions   Ambulation/Gait   Ambulation/Gait Yes   Ambulation/Gait Assistance 6: Modified independent  (Device/Increase time)   Ambulation Distance (Feet) 75 Feet   Assistive device Straight cane   Gait Pattern --  3 point, with SOC in LUE   Gait Comments poor fluency and utility with cane, limited capacity for unloading RLE.    6 Minute Walk- Baseline   6 Minute Walk- Baseline no  Limited appropriateness due to current restrictions                   [facility] Adult PT Treatment/Exercise - 12/10/15 0001    Exercises   Exercises Knee/Hip;Ankle   Knee/Hip Exercises: Standing   Hip Abduction Right;10 reps;Knee straight  BUE support on countertop, (HEP training)    Knee/Hip Exercises: Seated   Long Arc Quad Right;1 set;10 reps  (HEP training)    Marching AROM;Both;1 set;10 reps  (HEP training)    Ankle Exercises: Seated   Ankle Circles/Pumps Right;10 reps  (HEP training)    Towel Crunch 2 reps  2x30sec(HEP training)    Toe Raise 10 reps;3 seconds  (HEP training)    Other Seated Ankle Exercises Gross Toe Flexion  10x3sec (HEP training)                 PT Education - 12/10/15 2308    Education provided Yes   Education Details SPC use and sequencing to offset weightbearing of RLE cam rocker. Donning/doffing cam rocker.    Person(s) Educated Patient;Child(ren)   Methods Explanation;Demonstration;Tactile cues   Comprehension Verbalized understanding;Returned demonstration;Verbal cues required;Tactile cues required;Need further instruction;Other (comment)  Difficulty tranferring new information to motor task. Needs lots of repitition and cues to slow down.           PT Short Term Goals - 12/10/15 2324    PT SHORT TERM GOAL #1   Title After 2 weeks pt will demonstrate fluency with HEP for progress toward goals at home.    Status New   PT SHORT TERM GOAL #2   Title After 4 weeks pt will demonstrate good sequencing and use of SPC in a 2-point pattern to offset RLE loading to improve R foot heeling and improve safety in home.    PT SHORT TERM GOAL #3   Title After  4 weeks pt will demonstrate good fluency with RW for safer access of limited community distances.            PT Long Term Goals - 12/10/15 2326    PT LONG TERM GOAL #1   Title After 8 weeks patient will deonstrate 5/5 strength in BLE to demonstrate return to baseline level of function.  PT LONG TERM GOAL #2   Title After 8 weeks, patient will demonstrate > 8s SLS balance bilat to decrease risk of falls at home/community in the setting of osteopenia.    PT LONG TERM GOAL #3   Title After 8 weeks, patient will show return to baseline level of function made evident in TUG <10sec, and 6MWT >1541f to demonstrate ability to safely access the community.                Plan - 12/10/15 22312-03-02   Clinical Impression Statement Pt presenting with recent imaging findings suggestive of R foot stress fractures at the 2nd and 3rd metatarsals. The reffering physician has asked the patient to donn a cam rocker for 6 weeks and to limit WB on the RLE. The patient demonstrates impaired balance, cognition, strength, and pain, limiting the ability to perform ADL, IADL, and household tasks at prior level of funciton. Early stages of treatment will focus heavily on the preservations of ankle mobility, prevention of foot and ankle atrophy and weakness, prevention of hip and low back painfrom ambulation in a cam rocker, improving balance on the uneffected side only,, and extensive gait training with multiple AD on how to limited weightbearing.    Rehab Potential Good   Clinical Impairments Affecting Rehab Potential Balance and very mild cognitive impairment after CVA.    PT Frequency Other (comment)  1x weekly for 4 weeks, and 2x weekly for 4weeks.    PT Duration 8 weeks   PT Treatment/Interventions Gait training;Stair training;Functional mobility training;Therapeutic activities;Therapeutic exercise;Balance training;Patient/family education   PT Next Visit Plan Review HEP in full; in -depth gait training with  SPC and with RW. Stairs Training.    PT Home Exercise Plan Given and reviewed in full with daughter. (see treatment section)    Consulted and Agree with Plan of Care Patient      Patient will benefit from skilled therapeutic intervention in order to improve the following deficits and impairments:  Abnormal gait, Decreased cognition, Decreased activity tolerance, Decreased balance, Decreased safety awareness, Difficulty walking, Decreased strength  Visit Diagnosis: Other abnormalities of gait and mobility - Plan: PT plan of care cert/re-cert  Unsteadiness on feet - Plan: PT plan of care cert/re-cert  Muscle weakness (generalized) - Plan: PT plan of care cert/re-cert      G-Codes - [id number]   Functional Assessment Tool Used Clinical Judgment   Functional Limitation Mobility: Walking and moving around   Mobility: Walking and Moving Around Current Status [phone] At least 40 percent but less than 60 percent impaired, limited or restricted   Mobility: Walking and Moving Around Goal Status [phone] At least 40 percent but less than 60 percent impaired, limited or restricted       Problem List Patient Active Problem List   Diagnosis Date Noted  . History of stroke 03/03/2015  . GERD (gastroesophageal reflux disease) 02/06/2015  . Aortic sclerosis ([place] 12/02/2014  . Osteopenia 01/14/2014  . Prediabetes 08/05/2013  . Hypercalcemia 04/22/2013  . Renal insufficiency 02/15/2013  . Mild cognitive impairment 01/03/2013  . Memory loss 01/03/2013  . Ulcer 10/12/2012  . Hypertension 10/12/2012  . Hyperlipidemia 10/12/2012  . CVA (cerebral infarction) 10/12/2012  . HIP PAIN, RIGHT 12/29/2009    11:35 PM, 12/10/2015 AEtta Grandchild PT, DPT PRN [facility] [phone](office)      [address] [place] [address]Phone:  [phone]   Fax:  [phone]  Name: LAMYIAH CRAWSHAW MRN: [id number] Date of Birth: [birth year]

## 2015-12-22 ENCOUNTER — Ambulatory Visit (HOSPITAL_COMMUNITY): Payer: Medicare Other

## 2015-12-22 DIAGNOSIS — R2689 Other abnormalities of gait and mobility: Secondary | ICD-10-CM | POA: Diagnosis not present

## 2015-12-22 DIAGNOSIS — R2681 Unsteadiness on feet: Secondary | ICD-10-CM

## 2015-12-22 DIAGNOSIS — M6281 Muscle weakness (generalized): Secondary | ICD-10-CM | POA: Diagnosis not present

## 2015-12-22 NOTE — Therapy (Signed)
[place], [place], [address] Phone: [phone]   Fax:  [phone]  Physical Therapy Treatment  Patient Details  Name: Becky Baird MRN: [id number] Date of Birth: Mar 21, 1934 Referring Provider: Arther Abbott   Encounter Date: 12/22/2015      PT End of Session - 12/22/15 1318    Visit Number 2   Number of Visits 12   Date for PT Re-Evaluation 01/10/16   Authorization Type Medicare   Authorization Time Period 12/10/15-02/09/16   Authorization - Visit Number 1   PT Start Time 1301   PT Stop Time 1339   PT Time Calculation (min) 38 min   Activity Tolerance Patient tolerated treatment well;No increased pain   Behavior During Therapy [facility] for tasks assessed/performed      Past Medical History  Diagnosis Date  . Stroke ([place])     2010, RMCA with stent  . Anxiety   . Hypertension   . Hyperlipidemia   . Renal cyst   . Ulcer   . Occlusion and stenosis of carotid artery with cerebral infarction     Bilateral less than 50% carotid stenosis.  . Mild cognitive impairment, so stated   . Memory loss     Past Surgical History  Procedure Laterality Date  . Tonsillectomy      childhood  . Dilation and curettage of uterus      years ago  . Ovarian cyst removal      at least 10 years ago  . Colonoscopy  03/10/2011    Procedure: COLONOSCOPY;  Surgeon: Rogene Houston, MD;  [facility];  Service: Endoscopy;  Laterality: N/A;  . Brain stent      There were no vitals filed for this visit.      Subjective Assessment - 12/22/15 1304    Subjective Pt reports she has been working on her HEP, and that it is going well. Her pain remains absent. She has tried to get back to some fucntional acitivies like working in the garden, planting some flowers, while maintaining her precautions. She reports she is getting more proficient with the [facility]  .    Pertinent History Osteopenia, CVA 2010 (balance, visual,  and cognitive deficits per daughter)   How long can you stand comfortably? 30 minutes at eval, reported ot be better today    How long can you walk comfortably? 30 minutes in yard at eval, says she can walk a block today.    Diagnostic tests Xrays, Bone Scan   Patient Stated Goals Return to normal life, high activity levels.    Currently in Pain? No/denies                         [facility] Adult PT Treatment/Exercise - 12/22/15 0001    Ambulation/Gait   Ambulation/Gait Yes   Ambulation/Gait Assistance 6: Modified independent (Device/Increase time)   Ambulation Distance (Feet) 100 Feet  for gait assessment   Assistive device Straight cane   Gait Pattern --  3 point with SPC in LUE   Gait Comments timing of SPC WB is unpredicatable.    Exercises   Exercises Knee/Hip   Knee/Hip Exercises: Standing   Hip Abduction Right;10 reps;Knee straight  BUE support on countertop, (HEP training)    Functional Squat 2 sets;10 reps  STS from elevated surface   Knee/Hip Exercises: Seated   Long Arc Quad Right;1 set;10 reps  (HEP training)  Marching AROM;Both;1 set;10 reps  (HEP training)    Ankle Exercises: Seated   Ankle Circles/Pumps Right;10 reps  pumps 1s up, 1s down, (HEP review)    Towel Crunch 1 rep  1x60sec(HEP review)   Toe Raise 10 reps;3 seconds  (HEP review)    Other Seated Ankle Exercises Gross Toe Flexion  10x3sec (HEP review             Balance Exercises - 12/22/15 1336    Balance Exercises: Standing   Standing Eyes Opened Foam/compliant surface;Narrow base of support (BOS);2 reps  2x60s, arms x chest           PT Education - 12/22/15 1318    Education provided Yes   Education Details reviewed [facility] training and WB instructions.    Person(s) Educated Patient;Child(ren)   Methods Explanation;Demonstration   Comprehension Verbalized understanding;Returned demonstration          PT Short Term Goals - 12/10/15 2324    PT SHORT TERM GOAL #1    Title After 2 weeks pt will demonstrate fluency with HEP for progress toward goals at home.    Status New   PT SHORT TERM GOAL #2   Title After 4 weeks pt will demonstrate good sequencing and use of SPC in a 2-point pattern to offset RLE loading to improve R foot heeling and improve safety in home.    PT SHORT TERM GOAL #3   Title After 4 weeks pt will demonstrate good fluency with RW for safer access of limited community distances.            PT Long Term Goals - 12/10/15 2326    PT LONG TERM GOAL #1   Title After 8 weeks patient will deonstrate 5/5 strength in BLE to demonstrate return to baseline level of function.    PT LONG TERM GOAL #2   Title After 8 weeks, patient will demonstrate > 8s SLS balance bilat to decrease risk of falls at home/community in the setting of osteopenia.    PT LONG TERM GOAL #3   Title After 8 weeks, patient will show return to baseline level of function made evident in TUG <10sec, and 6MWT >1587ft to demonstrate ability to safely access the community.                Plan - 12/22/15 1320    Clinical Impression Statement Pt progressing well today toward goals. HEP is reviewed in full with some form modfication made. Added some more excercises in session today, and spent more time on gait training and sequencing. Also added in some balance activities.    Rehab Potential Good   Clinical Impairments Affecting Rehab Potential Balance and very mild cognitive impairment after CVA.    PT Frequency Other (comment)   PT Duration 8 weeks   PT Treatment/Interventions Gait training;Stair training;Functional mobility training;Therapeutic activities;Therapeutic exercise;Balance training;Patient/family education   PT Next Visit Plan continue with gait and balance training.    PT Home Exercise Plan no changes this session.    Consulted and Agree with Plan of Care Patient      Patient will benefit from skilled therapeutic intervention in order to improve the  following deficits and impairments:  Abnormal gait, Decreased cognition, Decreased activity tolerance, Decreased balance, Decreased safety awareness, Difficulty walking, Decreased strength  Visit Diagnosis: Other abnormalities of gait and mobility  Unsteadiness on feet  Muscle weakness (generalized)     Problem List Patient Active Problem List   Diagnosis Date Noted  .  History of stroke 03/03/2015  . GERD (gastroesophageal reflux disease) 02/06/2015  . Aortic sclerosis ([place]) 12/02/2014  . Osteopenia 01/14/2014  . Prediabetes 08/05/2013  . Hypercalcemia 04/22/2013  . Renal insufficiency 02/15/2013  . Mild cognitive impairment 01/03/2013  . Memory loss 01/03/2013  . Ulcer 10/12/2012  . Hypertension 10/12/2012  . Hyperlipidemia 10/12/2012  . CVA (cerebral infarction) 10/12/2012  . HIP PAIN, RIGHT 12/29/2009   1:40 PM, 12/22/2015 Etta Grandchild, PT, DPT PRN Physical Therapist at [place] # [id number]  [phone] (office)     [place] [address] [place], [place], [address] Phone: [phone]   Fax:  [phone]  Name: Becky Baird MRN: [id number] Date of Birth: [birth year]

## 2015-12-29 ENCOUNTER — Ambulatory Visit (HOSPITAL_COMMUNITY): Payer: Medicare Other | Attending: Orthopedic Surgery | Admitting: Physical Therapy

## 2015-12-29 DIAGNOSIS — M6281 Muscle weakness (generalized): Secondary | ICD-10-CM

## 2015-12-29 DIAGNOSIS — R2681 Unsteadiness on feet: Secondary | ICD-10-CM | POA: Diagnosis not present

## 2015-12-29 DIAGNOSIS — R2689 Other abnormalities of gait and mobility: Secondary | ICD-10-CM

## 2015-12-29 NOTE — Therapy (Signed)
[place], [place], [address] Phone: [phone]   Fax:  [phone]  Physical Therapy Treatment  Patient Details  Name: Becky Baird MRN: [id number] Date of Birth: 1934/07/25 Referring Provider: Arther Abbott   Encounter Date: 12/29/2015      PT End of Session - 12/29/15 1118    Visit Number 3   Number of Visits 12   Date for PT Re-Evaluation 01/10/16   Authorization Type Medicare   Authorization Time Period 12/10/15-02/09/16   Authorization - Visit Number 3   Authorization - Number of Visits 10   PT Start Time [id number]   PT Stop Time 1112   PT Time Calculation (min) 42 min   Activity Tolerance Patient tolerated treatment well;No increased pain   Behavior During Therapy [facility] for tasks assessed/performed      Past Medical History  Diagnosis Date  . Stroke ([place])     2010, RMCA with stent  . Anxiety   . Hypertension   . Hyperlipidemia   . Renal cyst   . Ulcer   . Occlusion and stenosis of carotid artery with cerebral infarction     Bilateral less than 50% carotid stenosis.  . Mild cognitive impairment, so stated   . Memory loss     Past Surgical History  Procedure Laterality Date  . Tonsillectomy      childhood  . Dilation and curettage of uterus      years ago  . Ovarian cyst removal      at least 10 years ago  . Colonoscopy  03/10/2011    Procedure: COLONOSCOPY;  Surgeon: Rogene Houston, MD;  [facility];  Service: Endoscopy;  Laterality: N/A;  . Brain stent      There were no vitals filed for this visit.      Subjective Assessment - 12/29/15 1038    Subjective PT states she is doing well overall hoping to get rid of her boot when she returns to the MD end of this month.   Currently in Pain? No/denies                         [facility] Adult PT Treatment/Exercise - 12/29/15 1106    Knee/Hip Exercises: Standing   Hip Extension Both;10 reps   Extension  Limitations BUE assist   Other Standing Knee Exercises tandem 30" each intermittent HHA   Knee/Hip Exercises: Seated   Sit to Sand 10 reps;without UE support   Ankle Exercises: Seated   Towel Inversion/Eversion 5 reps;Limitations   BAPS Level 2;Sitting;10 reps   Ankle Exercises: Stretches   Other Stretch Passive gastroc stretch 20" X 3   Ankle Exercises: Supine   Isometrics 10 reps Inv/ev   T-Band all directions RTB 10 reps each                  PT Short Term Goals - 12/10/15 2324    PT SHORT TERM GOAL #1   Title After 2 weeks pt will demonstrate fluency with HEP for progress toward goals at home.    Status New   PT SHORT TERM GOAL #2   Title After 4 weeks pt will demonstrate good sequencing and use of SPC in a 2-point pattern to offset RLE loading to improve R foot heeling and improve safety in home.    PT SHORT TERM GOAL #3   Title After 4 weeks pt will demonstrate good fluency with  RW for safer access of limited community distances.            PT Long Term Goals - 12/10/15 2326    PT LONG TERM GOAL #1   Title After 8 weeks patient will deonstrate 5/5 strength in BLE to demonstrate return to baseline level of function.    PT LONG TERM GOAL #2   Title After 8 weeks, patient will demonstrate > 8s SLS balance bilat to decrease risk of falls at home/community in the setting of osteopenia.    PT LONG TERM GOAL #3   Title After 8 weeks, patient will show return to baseline level of function made evident in TUG <10sec, and 6MWT >1524ft to demonstrate ability to safely access the community.                Plan - 12/29/15 1119    Clinical Impression Statement Progressed strengthening exercises this session with max assist to decrease hip and knee assist for ankle.  Pt with most weakness in ankle inversion > eversion with difficulty completing rotational movements with BAPS. pt instructed to continue HEP and use of boot with weight bearing.  Limited addition of  balance actvities until patient can weight bear without boot.  No new exercises added to HEP this session.    Rehab Potential Good   Clinical Impairments Affecting Rehab Potential Balance and very mild cognitive impairment after CVA.    PT Frequency Other (comment)   PT Duration 8 weeks   PT Treatment/Interventions Gait training;Stair training;Functional mobility training;Therapeutic activities;Therapeutic exercise;Balance training;Patient/family education   PT Next Visit Plan continue with gait and balance training.    PT Home Exercise Plan no changes this session.    Consulted and Agree with Plan of Care Patient      Patient will benefit from skilled therapeutic intervention in order to improve the following deficits and impairments:  Abnormal gait, Decreased cognition, Decreased activity tolerance, Decreased balance, Decreased safety awareness, Difficulty walking, Decreased strength  Visit Diagnosis: Other abnormalities of gait and mobility  Unsteadiness on feet  Muscle weakness (generalized)     Problem List Patient Active Problem List   Diagnosis Date Noted  . History of stroke 03/03/2015  . GERD (gastroesophageal reflux disease) 02/06/2015  . Aortic sclerosis ([place]) 12/02/2014  . Osteopenia 01/14/2014  . Prediabetes 08/05/2013  . Hypercalcemia 04/22/2013  . Renal insufficiency 02/15/2013  . Mild cognitive impairment 01/03/2013  . Memory loss 01/03/2013  . Ulcer 10/12/2012  . Hypertension 10/12/2012  . Hyperlipidemia 10/12/2012  . CVA (cerebral infarction) 10/12/2012  . HIP PAIN, RIGHT 12/29/2009    Teena Irani, PTA/CLT [phone]  12/29/2015, 11:22 AM  [place], [place], [address] Phone: [phone]   Fax:  [phone]  Name: Becky Baird MRN: [id number] Date of Birth: Jul 21, 1934

## 2016-01-05 ENCOUNTER — Ambulatory Visit (HOSPITAL_COMMUNITY): Payer: Medicare Other | Admitting: Physical Therapy

## 2016-01-05 DIAGNOSIS — M6281 Muscle weakness (generalized): Secondary | ICD-10-CM

## 2016-01-05 DIAGNOSIS — R2681 Unsteadiness on feet: Secondary | ICD-10-CM

## 2016-01-05 DIAGNOSIS — R2689 Other abnormalities of gait and mobility: Secondary | ICD-10-CM | POA: Diagnosis not present

## 2016-01-05 NOTE — Therapy (Signed)
[place], [place], [address] Phone: [phone]   Fax:  [phone]  Physical Therapy Treatment  Patient Details  Name: Becky Baird MRN: [id number] Date of Birth: 1933/12/06 Referring Provider: Arther Abbott   Encounter Date: 01/05/2016      PT End of Session - 01/05/16 1254    Visit Number 4   Number of Visits 12   Date for PT Re-Evaluation 01/10/16   Authorization Type Medicare   Authorization Time Period 12/10/15-02/09/16   Authorization - Visit Number 4   Authorization - Number of Visits 10   PT Start Time [id number]   PT Stop Time 1110   PT Time Calculation (min) 40 min   Activity Tolerance Patient tolerated treatment well;No increased pain   Behavior During Therapy [facility] for tasks assessed/performed      Past Medical History  Diagnosis Date  . Stroke ([place])     2010, RMCA with stent  . Anxiety   . Hypertension   . Hyperlipidemia   . Renal cyst   . Ulcer   . Occlusion and stenosis of carotid artery with cerebral infarction     Bilateral less than 50% carotid stenosis.  . Mild cognitive impairment, so stated   . Memory loss     Past Surgical History  Procedure Laterality Date  . Tonsillectomy      childhood  . Dilation and curettage of uterus      years ago  . Ovarian cyst removal      at least 10 years ago  . Colonoscopy  03/10/2011    Procedure: COLONOSCOPY;  Surgeon: Rogene Houston, MD;  [facility];  Service: Endoscopy;  Laterality: N/A;  . Brain stent      There were no vitals filed for this visit.      Subjective Assessment - 01/05/16 1037    Subjective Pt state she is not having any pain and is anxious to get her boot off.    Currently in Pain? No/denies                         [facility] Adult PT Treatment/Exercise - 01/05/16 1107    Knee/Hip Exercises: Standing   Hip Abduction 2 sets;10 reps   Hip Extension 2 sets;10 reps   Extension Limitations  BUE assist   Functional Squat 10 reps   SLS bilaterally max of 9" each LE   Other Standing Knee Exercises tandem 30" each intermittent HHA   Knee/Hip Exercises: Seated   Long Arc Quad Right;1 set;10 reps   Sit to [employer] 10 reps;without UE support   Knee/Hip Exercises: Supine   Straight Leg Raises Both   Knee/Hip Exercises: Sidelying   Hip ABduction 10 reps   Other Sidelying Knee/Hip Exercises ankle eversion with 3# weight 10 reps   Ankle Exercises: Supine   T-Band all directions RTB 10 reps each                  PT Short Term Goals - 12/10/15 2324    PT SHORT TERM GOAL #1   Title After 2 weeks pt will demonstrate fluency with HEP for progress toward goals at home.    Status New   PT SHORT TERM GOAL #2   Title After 4 weeks pt will demonstrate good sequencing and use of SPC in a 2-point pattern to offset RLE loading to improve R foot heeling and improve safety in  home.    PT SHORT TERM GOAL #3   Title After 4 weeks pt will demonstrate good fluency with RW for safer access of limited community distances.            PT Long Term Goals - 12/10/15 2326    PT LONG TERM GOAL #1   Title After 8 weeks patient will deonstrate 5/5 strength in BLE to demonstrate return to baseline level of function.    PT LONG TERM GOAL #2   Title After 8 weeks, patient will demonstrate > 8s SLS balance bilat to decrease risk of falls at home/community in the setting of osteopenia.    PT LONG TERM GOAL #3   Title After 8 weeks, patient will show return to baseline level of function made evident in TUG <10sec, and 6MWT >1512ft to demonstrate ability to safely access the community.                Plan - 01/05/16 1255    Clinical Impression Statement Continued progression with increased strength an dstabiliy of LT LE.  Difficulty today coordinating movements with BAPS and noted proximal LE involvement.  Weakness in hip abductors and extensors with added exercise today to increase this.  Pt  without pain during entire session.     Rehab Potential Good   Clinical Impairments Affecting Rehab Potential Balance and very mild cognitive impairment after CVA.    PT Frequency Other (comment)   PT Duration 8 weeks   PT Treatment/Interventions Gait training;Stair training;Functional mobility training;Therapeutic activities;Therapeutic exercise;Balance training;Patient/family education   PT Next Visit Plan continue with gait and balance training. Progress standing ankle stability when able to remove boot.  re-evaluate next session for MD appointment.    PT Home Exercise Plan no changes this session.    Consulted and Agree with Plan of Care Patient      Patient will benefit from skilled therapeutic intervention in order to improve the following deficits and impairments:  Abnormal gait, Decreased cognition, Decreased activity tolerance, Decreased balance, Decreased safety awareness, Difficulty walking, Decreased strength  Visit Diagnosis: Other abnormalities of gait and mobility  Unsteadiness on feet  Muscle weakness (generalized)     Problem List Patient Active Problem List   Diagnosis Date Noted  . History of stroke 03/03/2015  . GERD (gastroesophageal reflux disease) 02/06/2015  . Aortic sclerosis ([place]) 12/02/2014  . Osteopenia 01/14/2014  . Prediabetes 08/05/2013  . Hypercalcemia 04/22/2013  . Renal insufficiency 02/15/2013  . Mild cognitive impairment 01/03/2013  . Memory loss 01/03/2013  . Ulcer 10/12/2012  . Hypertension 10/12/2012  . Hyperlipidemia 10/12/2012  . CVA (cerebral infarction) 10/12/2012  . HIP PAIN, RIGHT 12/29/2009    Teena Irani, PTA/CLT [phone]  01/05/2016, 12:57 PM  [place] [address] [place], [place], [address] Phone: [phone]   Fax:  [phone]  Name: Becky Baird MRN: [id number] Date of Birth: 04-23-1934

## 2016-01-12 ENCOUNTER — Ambulatory Visit (HOSPITAL_COMMUNITY): Payer: Medicare Other

## 2016-01-12 DIAGNOSIS — R2681 Unsteadiness on feet: Secondary | ICD-10-CM

## 2016-01-12 DIAGNOSIS — M6281 Muscle weakness (generalized): Secondary | ICD-10-CM | POA: Diagnosis not present

## 2016-01-12 DIAGNOSIS — R2689 Other abnormalities of gait and mobility: Secondary | ICD-10-CM | POA: Diagnosis not present

## 2016-01-12 NOTE — Therapy (Signed)
[place], [place], [address] Phone: [phone]   Fax:  [phone]  Physical Therapy Treatment  Patient Details  Name: Becky Baird MRN: [id number] Date of Birth: Oct 13, 1933 Referring Provider: Arther Abbott   Encounter Date: 01/12/2016      PT End of Session - 01/12/16 1244    Visit Number 5   Number of Visits 12   Date for PT Re-Evaluation 02/09/16   Authorization Type Medicare   Authorization Time Period 12/10/15-02/09/16   Authorization - Visit Number 5   Authorization - Number of Visits 10   PT Start Time 7425   PT Stop Time 1113   PT Time Calculation (min) 38 min   Activity Tolerance Patient tolerated treatment well;No increased pain   Behavior During Therapy [facility] for tasks assessed/performed      Past Medical History  Diagnosis Date  . Stroke ([place])     2010, RMCA with stent  . Anxiety   . Hypertension   . Hyperlipidemia   . Renal cyst   . Ulcer   . Occlusion and stenosis of carotid artery with cerebral infarction     Bilateral less than 50% carotid stenosis.  . Mild cognitive impairment, so stated   . Memory loss     Past Surgical History  Procedure Laterality Date  . Tonsillectomy      childhood  . Dilation and curettage of uterus      years ago  . Ovarian cyst removal      at least 10 years ago  . Colonoscopy  03/10/2011    Procedure: COLONOSCOPY;  Surgeon: Rogene Houston, MD;  [facility];  Service: Endoscopy;  Laterality: N/A;  . Brain stent      There were no vitals filed for this visit.      Subjective Assessment - 01/12/16 1039    Subjective Pt reports she has been doing well, getting outside more, walking more. She feels that the [facility] use is much easier now.    Pertinent History Osteopenia, CVA 2010 (balance, visual, and cognitive deficits per daughter)   Currently in Pain? No/denies                         [facility] Adult PT  Treatment/Exercise - 01/12/16 0001    Ambulation/Gait   Ambulation/Gait Yes   Ambulation/Gait Assistance 5: Supervision   Ambulation Distance (Feet) 450 Feet   Assistive device Straight cane   Gait velocity 0.52ms   Gait Comments good sequencing, minimal WB on SPC   Knee/Hip Exercises: Standing   Knee Flexion Limitations 2x15 c 5# weight   Hip Abduction 2 sets;10 reps   Hip Extension 2 sets;10 reps   Extension Limitations BUE assist   Functional Squat 2 sets;10 reps  Left side only   SLS Left Side Only   5x10 seconds (fatigued c trendelenburg)    Other Standing Knee Exercises Standing hip flexion RLE   2x10   Knee/Hip Exercises: Seated   Long Arc Quad Right;2 sets  2x15 c 5# cuff              Balance Exercises - 01/12/16 1110    Balance Exercises: Standing   Standing Eyes Opened Foam/compliant surface;Narrow base of support (BOS);3 reps;30 secs  trunk head rotation.              PT Short Term Goals - 01/12/16 1247    PT SHORT  TERM GOAL #1   Title After 2 weeks pt will demonstrate fluency with HEP for progress toward goals at home.    Status Achieved   PT SHORT TERM GOAL #2   Title After 4 weeks pt will demonstrate good sequencing and use of SPC in a 2-point pattern to offset RLE loading to improve R foot heeling and improve safety in home.    Status Achieved   PT SHORT TERM GOAL #3   Title After 4 weeks pt will demonstrate good fluency with RW for safer access of limited community distances.    Status Deferred           PT Long Term Goals - February 03, 2016 1247    PT LONG TERM GOAL #1   Title After 8 weeks patient will deonstrate 5/5 strength in BLE to demonstrate return to baseline level of function.    PT LONG TERM GOAL #2   Title After 8 weeks, patient will demonstrate > 8s SLS balance bilat to decrease risk of falls at home/community in the setting of osteopenia.    Status Partially Met   PT LONG TERM GOAL #3   Title After 8 weeks, patient will show  return to baseline level of function made evident in TUG <10sec, and 6MWT >1578f to demonstrate ability to safely access the community.    Status On-going               Plan - 007/12/20171102    Clinical Impression Statement Reassessment performed today: Pt making great progress toward goals, able to progress all exercises in weight or reps. Hip stength remaisn limitng to balance and stability, but improving minimally. Functional mobility continues to improve at home as well.  Pt remains pain free in right foot. Sequencing with SPC during gait is demonstrated with fluency but no significant WB bearing benefit demonstrated. Will continue with Plan of care as outilined in evaluation and await any updates from orthopedist after follow up.    Rehab Potential Good   Clinical Impairments Affecting Rehab Potential Balance and very mild cognitive impairment after CVA.    PT Frequency Other (comment)   PT Duration 8 weeks   PT Treatment/Interventions Gait training;Stair training;Functional mobility training;Therapeutic activities;Therapeutic exercise;Balance training;Patient/family education   PT Next Visit Plan Discuss findings from ortho FU visit.    PT Home Exercise Plan no changes this session.       Patient will benefit from skilled therapeutic intervention in order to improve the following deficits and impairments:  Abnormal gait, Decreased cognition, Decreased activity tolerance, Decreased balance, Decreased safety awareness, Difficulty walking, Decreased strength  Visit Diagnosis: Other abnormalities of gait and mobility  Unsteadiness on feet  Muscle weakness (generalized)       G-Codes - 007/12/171248    Functional Assessment Tool Used Clinical Judgment   Functional Limitation Mobility: Walking and moving around   Mobility: Walking and Moving Around Current Status ([phone] At least 20 percent but less than 40 percent impaired, limited or restricted   Mobility: Walking and Moving  Around Goal Status [phone] At least 20 percent but less than 40 percent impaired, limited or restricted      Problem List Patient Active Problem List   Diagnosis Date Noted  . History of stroke 03/03/2015  . GERD (gastroesophageal reflux disease) 02/06/2015  . [place]ortic sclerosis ([place] 12/02/2014  . Osteopenia 01/14/2014  . Prediabetes 08/05/2013  . Hypercalcemia 04/22/2013  . Renal insufficiency 02/15/2013  . Mild cognitive impairment 01/03/2013  . Memory  loss 01/03/2013  . Ulcer 10/12/2012  . Hypertension 10/12/2012  . Hyperlipidemia 10/12/2012  . CVA (cerebral infarction) 10/12/2012  . HIP PAIN, RIGHT 12/29/2009   12:51 PM, 01/12/2016 Etta Grandchild, PT, DPT PRN Physical Therapist at [place] # [id number] [phone] (office)      [place] [address] [place], [place], [address] Phone: [phone]   Fax:  [phone]  Name: Becky Baird MRN: [id number] Date of Birth: [birth year]

## 2016-01-18 ENCOUNTER — Encounter: Payer: Self-pay | Admitting: Orthopedic Surgery

## 2016-01-18 ENCOUNTER — Ambulatory Visit (INDEPENDENT_AMBULATORY_CARE_PROVIDER_SITE_OTHER): Payer: Medicare Other | Admitting: Orthopedic Surgery

## 2016-01-18 VITALS — BP 170/74 | Ht 65.5 in | Wt 160.0 lb

## 2016-01-18 DIAGNOSIS — M84374D Stress fracture, right foot, subsequent encounter for fracture with routine healing: Secondary | ICD-10-CM

## 2016-01-18 NOTE — Progress Notes (Signed)
Patient ID: Becky Baird, female   DOB: 1933-08-08, 82 y.o.   MRN: [id number]  Pain right foot follow-up after 6 weeks in the boot  HPI  Foot Pain     Right foot pain    HPI 82 year old female presents with 8-10 week history of pain after mowing her lawn involving her right foot second and third metatarsal. Pain swelling sharp stabbing aching pain constant 6 out of 10 can get to 10 out of 10 partially relieved with a metatarsal hard sole shoe. Did take Aleve 4 months no improvement  Review of systems gait disturbance stiff joints swollen joint back pain joint pain seasonal allergy lightheadedness weakness abdominal pain no chest pain no respiratory shortness of breath     He says her pain is completely resolved  ROS  No swelling in the right foot  There were no vitals taken for this visit. Gen. appearance is normal grooming and hygiene Orientation to person place and time normal Mood normal Gait is normal  No peripheral edema or swelling is noted in the right foot right ankle Sensory exam shows normal sensation to palpation, pressure and soft touch Skin exam no lacerations ulcerations or erythema  Ortho Exam  Foot alignment normal no tenderness Lisfranc joint stable A/P  Medical decision-making  Encounter Diagnosis  Name Primary?  . Stress fracture of foot, right, with routine healing, subsequent encounter Yes   Resolved stress fracture return to normal activity follow-up as needed  Arther Abbott, MD 01/18/2016 10:33 AM

## 2016-01-18 NOTE — Patient Instructions (Signed)
Return to normal activity 

## 2016-01-19 ENCOUNTER — Ambulatory Visit (HOSPITAL_COMMUNITY): Payer: Medicare Other

## 2016-01-19 DIAGNOSIS — R2689 Other abnormalities of gait and mobility: Secondary | ICD-10-CM | POA: Diagnosis not present

## 2016-01-19 DIAGNOSIS — R2681 Unsteadiness on feet: Secondary | ICD-10-CM | POA: Diagnosis not present

## 2016-01-19 DIAGNOSIS — M6281 Muscle weakness (generalized): Secondary | ICD-10-CM

## 2016-01-19 NOTE — Therapy (Signed)
[place], [place], [address] Phone: [phone]   Fax:  [phone]  Physical Therapy Treatment  Patient Details  Name: Becky Baird MRN: [id number] Date of Birth: 1933/08/07 Referring Provider: Arther Abbott   Encounter Date: 01/19/2016      PT End of Session - 01/19/16 1229    Visit Number 6   Number of Visits 12   Date for PT Re-Evaluation 02/09/16   Authorization Type Medicare   Authorization Time Period 12/10/15-02/09/16   Authorization - Visit Number 6   Authorization - Number of Visits 10   PT Start Time 6503   PT Stop Time 1115   PT Time Calculation (min) 44 min   Equipment Utilized During Treatment Gait belt   Activity Tolerance Patient tolerated treatment well;No increased pain   Behavior During Therapy [facility] for tasks assessed/performed      Past Medical History  Diagnosis Date  . Stroke ([place])     2010, RMCA with stent  . Anxiety   . Hypertension   . Hyperlipidemia   . Renal cyst   . Ulcer   . Occlusion and stenosis of carotid artery with cerebral infarction     Bilateral less than 50% carotid stenosis.  . Mild cognitive impairment, so stated   . Memory loss     Past Surgical History  Procedure Laterality Date  . Tonsillectomy      childhood  . Dilation and curettage of uterus      years ago  . Ovarian cyst removal      at least 10 years ago  . Colonoscopy  03/10/2011    Procedure: COLONOSCOPY;  Surgeon: Rogene Houston, MD;  Location[facility];  Service: Endoscopy;  Laterality: N/A;  . Brain stent      There were no vitals filed for this visit.      Subjective Assessment - 01/19/16 1038    Subjective Pt reports she is doing well, she has been given clearance to remove her boot without restrictions. She is getting ready to start Silver Sneakers.    Pertinent History Osteopenia, CVA 2010 (balance, visual, and cognitive deficits per daughter)   Currently in Pain?  No/denies            [facility] PT Assessment - 01/19/16 0001    AROM   Right Ankle Dorsiflexion 14   Left Ankle Dorsiflexion 2   Strength   Right Ankle Dorsiflexion 5/5   Right Ankle Plantar Flexion 4+/5  soleus (seated c downward force at knee)   Left Ankle Dorsiflexion 4+/5   Left Ankle Plantar Flexion 4+/5  soleus (seated c downward force at knee)                     [facility] Adult PT Treatment/Exercise - 01/19/16 0001    Knee/Hip Exercises: Stretches   Gastroc Stretch Both;3 reps;30 seconds  narrow stance    Other Knee/Hip Stretches bila toe raises, narrow stance  heels elevated (2"); 2x15s   Other Knee/Hip Stretches heel raises             Balance Exercises - 01/19/16 1107    Balance Exercises: Standing   Standing Eyes Opened Foam/compliant surface;Narrow base of support (BOS);1 rep  1x8 bilat diagonal rotation with physio balls   Tandem Gait 2 reps  15x   Retro Gait 2 reps  toe to heel, VC for step through gait  PT Short Term Goals - 01/12/16 1247    PT SHORT TERM GOAL #1   Title After 2 weeks pt will demonstrate fluency with HEP for progress toward goals at home.    Status Achieved   PT SHORT TERM GOAL #2   Title After 4 weeks pt will demonstrate good sequencing and use of SPC in a 2-point pattern to offset RLE loading to improve R foot heeling and improve safety in home.    Status Achieved   PT SHORT TERM GOAL #3   Title After 4 weeks pt will demonstrate good fluency with RW for safer access of limited community distances.    Status Deferred           PT Long Term Goals - 01/12/16 1247    PT LONG TERM GOAL #1   Title After 8 weeks patient will deonstrate 5/5 strength in BLE to demonstrate return to baseline level of function.    PT LONG TERM GOAL #2   Title After 8 weeks, patient will demonstrate > 8s SLS balance bilat to decrease risk of falls at home/community in the setting of osteopenia.    Status Partially Met   PT  LONG TERM GOAL #3   Title After 8 weeks, patient will show return to baseline level of function made evident in TUG <10sec, and 6MWT >1515f to demonstrate ability to safely access the community.    Status On-going               Plan - 01/19/16 1231    Clinical Impression Statement Today is the first PT session since patient has followed up with ortho: released from restrictions and boot. Assessed ankle ROM and strength bilat. Continued to progress balance activites and advanced gait training, especially with ankle funciton in mind. Pt continues to demonstrate some  chronic left ankle limitations related to a remote stroke which makes it difficult to assess PLOF in R ankle. Left hip remains limited in strength, proprioception, and balance,  likely artifact of prlonged reduction in WB in boot. This is most evident in decreased L step length during retroambulation trial. Patient is making progress toward all goals, as evidenced by the above. Therex this session moved more toward activites that were previosuly restricted by precautions.    Rehab Potential Good   Clinical Impairments Affecting Rehab Potential Balance and very mild cognitive impairment after remote CVA.    PT Frequency Other (comment)   PT Duration 8 weeks   PT Treatment/Interventions Gait training;Stair training;Functional mobility training;Therapeutic activities;Therapeutic exercise;Balance training;Patient/family education   PT Home Exercise Plan Next time, update HEP as tolerated to add in new ankle exercises, and stretches.    Consulted and Agree with Plan of Care Patient      Patient will benefit from skilled therapeutic intervention in order to improve the following deficits and impairments:  Abnormal gait, Decreased cognition, Decreased activity tolerance, Decreased balance, Decreased safety awareness, Difficulty walking, Decreased strength  Visit Diagnosis: Other abnormalities of gait and mobility  Unsteadiness on  feet  Muscle weakness (generalized)     Problem List Patient Active Problem List   Diagnosis Date Noted  . History of stroke 03/03/2015  . GERD (gastroesophageal reflux disease) 02/06/2015  . Aortic sclerosis ([place] 12/02/2014  . Osteopenia 01/14/2014  . Prediabetes 08/05/2013  . Hypercalcemia 04/22/2013  . Renal insufficiency 02/15/2013  . Mild cognitive impairment 01/03/2013  . Memory loss 01/03/2013  . Ulcer 10/12/2012  . Hypertension 10/12/2012  . Hyperlipidemia 10/12/2012  .  CVA (cerebral infarction) 10/12/2012  . HIP PAIN, RIGHT 12/29/2009    12:43 PM, 01/19/2016 Etta Grandchild, PT, DPT Physical Therapist - [place] [phone] (mobile)   [place], [place], [address] Phone: [phone]   Fax:  [phone]  Name: Becky Baird MRN: [id number] Date of Birth: 03-09-1934

## 2016-01-21 ENCOUNTER — Ambulatory Visit (HOSPITAL_COMMUNITY): Payer: Medicare Other | Admitting: Physical Therapy

## 2016-01-21 DIAGNOSIS — M6281 Muscle weakness (generalized): Secondary | ICD-10-CM | POA: Diagnosis not present

## 2016-01-21 DIAGNOSIS — R2681 Unsteadiness on feet: Secondary | ICD-10-CM | POA: Diagnosis not present

## 2016-01-21 DIAGNOSIS — R2689 Other abnormalities of gait and mobility: Secondary | ICD-10-CM

## 2016-01-21 NOTE — Therapy (Signed)
[place], [place], [address] Phone: [phone]   Fax:  [phone]  Physical Therapy Treatment  Patient Details  Name: Becky Baird MRN: [id number] Date of Birth: [birth year] Referring Provider: Arther Abbott   Encounter Date: 01/21/2016      PT End of Session - 01/21/16 1037    Visit Number 7   Number of Visits 12   Date for PT Re-Evaluation 02/09/16   Authorization Type Medicare   Authorization Time Period 12/10/15-02/09/16   Authorization - Visit Number 7   Authorization - Number of Visits 10   PT Start Time [phone]   PT Stop Time 1030   PT Time Calculation (min) 38 min   Activity Tolerance Patient tolerated treatment well   Behavior During Therapy [facility] for tasks assessed/performed      Past Medical History  Diagnosis Date  . Stroke ([place])     2010, RMCA with stent  . Anxiety   . Hypertension   . Hyperlipidemia   . Renal cyst   . Ulcer   . Occlusion and stenosis of carotid artery with cerebral infarction     Bilateral less than 50% carotid stenosis.  . Mild cognitive impairment, so stated   . Memory loss     Past Surgical History  Procedure Laterality Date  . Tonsillectomy      childhood  . Dilation and curettage of uterus      years ago  . Ovarian cyst removal      at least 10 years ago  . Colonoscopy  03/10/2011    Procedure: COLONOSCOPY;  Surgeon: Rogene Houston, MD;  Location[facility];  Service: Endoscopy;  Laterality: N/A;  . Brain stent      There were no vitals filed for this visit.      Subjective Assessment - 01/21/16 0955    Subjective Patient arrives today stating that she is doing well, she is sore from last session but otherwise feeling OK, she is also tired from her workout at silver sneakers    Pertinent History Osteopenia, CVA 2010 (balance, visual, and cognitive deficits per daughter)   Currently in Pain? No/denies  just soreness                           [facility] Adult PT Treatment/Exercise - 01/21/16 0001    Knee/Hip Exercises: Standing   Heel Raises Both;1 set;15 reps   Heel Raises Limitations heel and toe    Rocker Board 2 minutes   Rocker Board Limitations AP and lateral U HHA    Knee/Hip Exercises: Seated   Other Seated Knee/Hip Exercises ankle pumps and inversion/eversion 1x15   Ankle Exercises: Seated   ABC's 2 reps   Ankle Circles/Pumps Right;15 reps             Balance Exercises - 01/21/16 1007    Balance Exercises: Standing   Standing Eyes Closed Narrow base of support (BOS);Foam/compliant surface;3 reps;20 secs   Tandem Stance Eyes open;3 reps;20 secs   SLS Eyes open;Solid surface;4 reps;10 secs           PT Education - 01/21/16 1037    Education provided No          PT Short Term Goals - 01/12/16 1247    PT SHORT TERM GOAL #1   Title After 2 weeks pt will demonstrate fluency with HEP for progress toward goals at home.  Status Achieved   PT SHORT TERM GOAL #2   Title After 4 weeks pt will demonstrate good sequencing and use of SPC in a 2-point pattern to offset RLE loading to improve R foot heeling and improve safety in home.    Status Achieved   PT SHORT TERM GOAL #3   Title After 4 weeks pt will demonstrate good fluency with RW for safer access of limited community distances.    Status Deferred           PT Long Term Goals - 01/12/16 1247    PT LONG TERM GOAL #1   Title After 8 weeks patient will deonstrate 5/5 strength in BLE to demonstrate return to baseline level of function.    PT LONG TERM GOAL #2   Title After 8 weeks, patient will demonstrate > 8s SLS balance bilat to decrease risk of falls at home/community in the setting of osteopenia.    Status Partially Met   PT LONG TERM GOAL #3   Title After 8 weeks, patient will show return to baseline level of function made evident in TUG <10sec, and 6MWT >1517f to demonstrate ability to safely access the  community.    Status On-going               Plan - 01/21/16 1037    Clinical Impression Statement Patient arrives feeling well however states that she was sore from yesterday and also from starting silver sneakers yesterday. Performed basic ankle mobility and strengthening exercises in OK[place] and CKC positions, as well as intensive work on balance today as well. Patient does demonstrate ongoing gait impairment as well as impaired balance, with some of this coming from impairment from old CVA. Updated HEP today to include more ankle mobility and introduced tandem stance with support at home.    Rehab Potential Good   Clinical Impairments Affecting Rehab Potential Balance and very mild cognitive impairment after remote CVA.    PT Frequency Other (comment)   PT Duration 8 weeks   PT Treatment/Interventions Gait training;Stair training;Functional mobility training;Therapeutic activities;Therapeutic exercise;Balance training;Patient/family education   PT Next Visit Plan ankle strength, anklle mobilty, balance, gait    Consulted and Agree with Plan of Care Patient      Patient will benefit from skilled therapeutic intervention in order to improve the following deficits and impairments:  Abnormal gait, Decreased cognition, Decreased activity tolerance, Decreased balance, Decreased safety awareness, Difficulty walking, Decreased strength  Visit Diagnosis: Other abnormalities of gait and mobility  Unsteadiness on feet  Muscle weakness (generalized)     Problem List Patient Active Problem List   Diagnosis Date Noted  . History of stroke 03/03/2015  . GERD (gastroesophageal reflux disease) 02/06/2015  . [place]ortic sclerosis [place] 12/02/2014  . Osteopenia 01/14/2014  . Prediabetes 08/05/2013  . Hypercalcemia 04/22/2013  . Renal insufficiency 02/15/2013  . Mild cognitive impairment 01/03/2013  . Memory loss 01/03/2013  . Ulcer 10/12/2012  . Hypertension 10/12/2012  . Hyperlipidemia  10/12/2012  . CVA (cerebral infarction) 10/12/2012  . HIP PAIN, RIGHT 12/29/2009    KDeniece ReePT, DPT [facility]SFort [place] [address]Phone: [phone]  Fax:  [phone] Name: CAYLSSA HERRIGMRN: [id number] of Birth: [birth year]

## 2016-01-21 NOTE — Patient Instructions (Signed)
   ANKLE CIRCLES  Move your ankle in a circular pattern in a clockwise direction 15 times, then counter clockwise 15 times.  Repeat twice a day.    ANKLE ABC's   While in a seated position, write out the alphabet in the air with your big toe.  Your ankle should be moving as you perform this, not your leg or hip.  Repeat twice, 2 times a day.    TANDEM STANCE WITH SUPPORT  Stand in front of a chair, table or counter top for support. Then place the heel of one foot so that it is touching the toes of the other foot. Maintain your balance in this position.   Hold as long as you can, then switch your feet.  Repeat 3 times each side, twice a day.

## 2016-01-28 ENCOUNTER — Ambulatory Visit (HOSPITAL_COMMUNITY): Payer: Medicare Other | Attending: Orthopedic Surgery

## 2016-01-28 DIAGNOSIS — R2689 Other abnormalities of gait and mobility: Secondary | ICD-10-CM | POA: Diagnosis not present

## 2016-01-28 DIAGNOSIS — M6281 Muscle weakness (generalized): Secondary | ICD-10-CM | POA: Diagnosis not present

## 2016-01-28 DIAGNOSIS — R2681 Unsteadiness on feet: Secondary | ICD-10-CM | POA: Diagnosis not present

## 2016-01-28 NOTE — Therapy (Signed)
[place], [place], [address] Phone: [phone]   Fax:  [phone]  Physical Therapy Treatment  Patient Details  Name: Becky Baird MRN: [id number] Date of Birth: 1933/07/28 Referring Provider: Arther Abbott   Encounter Date: 01/28/2016      PT End of Session - 01/28/16 1036    Visit Number 8   Number of Visits 12   Date for PT Re-Evaluation 02/09/16   Authorization Type Medicare   Authorization Time Period 12/10/15-02/09/16   Authorization - Visit Number 8   Authorization - Number of Visits 10   PT Start Time 3646   PT Stop Time 1118   PT Time Calculation (min) 47 min   Equipment Utilized During Treatment Gait belt   Activity Tolerance Patient tolerated treatment well   Behavior During Therapy [facility] for tasks assessed/performed      Past Medical History  Diagnosis Date  . Stroke ([place])     2010, RMCA with stent  . Anxiety   . Hypertension   . Hyperlipidemia   . Renal cyst   . Ulcer   . Occlusion and stenosis of carotid artery with cerebral infarction     Bilateral less than 50% carotid stenosis.  . Mild cognitive impairment, so stated   . Memory loss     Past Surgical History  Procedure Laterality Date  . Tonsillectomy      childhood  . Dilation and curettage of uterus      years ago  . Ovarian cyst removal      at least 10 years ago  . Colonoscopy  03/10/2011    Procedure: COLONOSCOPY;  Surgeon: Rogene Houston, MD;  [facility];  Service: Endoscopy;  Laterality: N/A;  . Brain stent      There were no vitals filed for this visit.      Subjective Assessment - 01/28/16 1035    Subjective Pt stated she is feeling good today, reports most difficulty currently with balance.  Reports compliance with HEP daily.     Pertinent History Osteopenia, CVA 2010 (balance, visual, and cognitive deficits per daughter)   Currently in Pain? No/denies             [facility] Adult PT  Treatment/Exercise - 01/28/16 0001    Knee/Hip Exercises: Stretches   Gastroc Stretch Both;3 reps;30 seconds   Gastroc Stretch Limitations slant board   Knee/Hip Exercises: Standing   Heel Raises Both;1 set;15 reps   Heel Raises Limitations heel and toe    Rocker Board 2 minutes   Rocker Board Limitations AP and lateral U HHA    SLS Lt 6", Rt 9" max of 3   Gait Training Heel to toe pattern and appropriate arm swing             Balance Exercises - 01/28/16 1105    Balance Exercises: Standing   Standing Eyes Closed Narrow base of support (BOS);Foam/compliant surface;3 reps;20 secs   Tandem Stance Eyes open;Foam/compliant surface;3 reps;30 secs   SLS Eyes open;3 reps;Time  Lt 6". Rt 9" max of 3   Balance Beam tandem and sidestep 2RT             PT Short Term Goals - 01/12/16 1247    PT SHORT TERM GOAL #1   Title After 2 weeks pt will demonstrate fluency with HEP for progress toward goals at home.    Status Achieved   PT SHORT TERM GOAL #2  Title After 4 weeks pt will demonstrate good sequencing and use of SPC in a 2-point pattern to offset RLE loading to improve R foot heeling and improve safety in home.    Status Achieved   PT SHORT TERM GOAL #3   Title After 4 weeks pt will demonstrate good fluency with RW for safer access of limited community distances.    Status Deferred           PT Long Term Goals - 01/12/16 1247    PT LONG TERM GOAL #1   Title After 8 weeks patient will deonstrate 5/5 strength in BLE to demonstrate return to baseline level of function.    PT LONG TERM GOAL #2   Title After 8 weeks, patient will demonstrate > 8s SLS balance bilat to decrease risk of falls at home/community in the setting of osteopenia.    Status Partially Met   PT LONG TERM GOAL #3   Title After 8 weeks, patient will show return to baseline level of function made evident in TUG <10sec, and 6MWT >1571f to demonstrate ability to safely access the community.    Status  On-going               Plan - 01/28/16 1130    Clinical Impression Statement Session focus on improving ankle mobility with stretches, ankle stabiltiy and strengthening exercises and progressed balance activities on static and dynamic surfaces.  Progressed balance activities to balance beam with min A and cueing to improve posture on new surface.  Reviewed proper gait mechanics with cueing to improve heel to toe and appropriate arm swing with noted  WBOS at corners due to balance impairements.  No reports of pain through session.     Rehab Potential Good   Clinical Impairments Affecting Rehab Potential Balance and very mild cognitive impairment after remote CVA.    PT Frequency Other (comment)  1x weekly for 4 weeks, and 2x weekly for 4weeks   PT Duration 8 weeks   PT Treatment/Interventions Gait training;Stair training;Functional mobility training;Therapeutic activities;Therapeutic exercise;Balance training;Patient/family education   PT Next Visit Plan ankle strength, anklle mobilty, balance, gait   Add seat BAPS board for ankle mobility, progress to standing BAPS to improve proprioception   PT Home Exercise Plan reviewed updated HEP, reports compliance daily      Patient will benefit from skilled therapeutic intervention in order to improve the following deficits and impairments:  Abnormal gait, Decreased cognition, Decreased activity tolerance, Decreased balance, Decreased safety awareness, Difficulty walking, Decreased strength  Visit Diagnosis: Other abnormalities of gait and mobility  Unsteadiness on feet  Muscle weakness (generalized)     Problem List Patient Active Problem List   Diagnosis Date Noted  . History of stroke 03/03/2015  . GERD (gastroesophageal reflux disease) 02/06/2015  . Aortic sclerosis ([place] 12/02/2014  . Osteopenia 01/14/2014  . Prediabetes 08/05/2013  . Hypercalcemia 04/22/2013  . Renal insufficiency 02/15/2013  . Mild cognitive impairment  01/03/2013  . Memory loss 01/03/2013  . Ulcer 10/12/2012  . Hypertension 10/12/2012  . Hyperlipidemia 10/12/2012  . CVA (cerebral infarction) 10/12/2012  . HIP PAIN, RIGHT 12/29/2009   [place] [facility] CAldona Lento7/12/2015, 11:43 AM  [place] [address]Phone: [phone]  Fax:  [phone] Name: CSHALONA HARBOURMRN: [id number] of Birth: 5Nov 18, 1935

## 2016-02-01 ENCOUNTER — Other Ambulatory Visit: Payer: Self-pay | Admitting: Family Medicine

## 2016-02-02 ENCOUNTER — Telehealth (HOSPITAL_COMMUNITY): Payer: Self-pay

## 2016-02-02 ENCOUNTER — Ambulatory Visit (HOSPITAL_COMMUNITY): Payer: Medicare Other

## 2016-02-02 DIAGNOSIS — M6281 Muscle weakness (generalized): Secondary | ICD-10-CM

## 2016-02-02 DIAGNOSIS — R2689 Other abnormalities of gait and mobility: Secondary | ICD-10-CM

## 2016-02-02 DIAGNOSIS — R2681 Unsteadiness on feet: Secondary | ICD-10-CM | POA: Diagnosis not present

## 2016-02-02 NOTE — Therapy (Signed)
[place], [place], [address] Phone: [phone]   Fax:  [phone]  Physical Therapy Treatment  Patient Details  Name: Becky Baird MRN: [id number] Date of Birth: 02/23/1934 Referring Provider: Arther Abbott   Encounter Date: 02/02/2016      PT End of Session - 02/02/16 1205    Visit Number 9   Number of Visits 12   Date for PT Re-Evaluation 02/09/16   Authorization Type Medicare   Authorization Time Period 12/10/15-02/09/16   Authorization - Visit Number 9   Authorization - Number of Visits 10   PT Start Time 1118   PT Stop Time 1158   PT Time Calculation (min) 40 min   Activity Tolerance Patient tolerated treatment well   Behavior During Therapy [facility] for tasks assessed/performed      Past Medical History  Diagnosis Date  . Stroke ([place])     2010, RMCA with stent  . Anxiety   . Hypertension   . Hyperlipidemia   . Renal cyst   . Ulcer   . Occlusion and stenosis of carotid artery with cerebral infarction     Bilateral less than 50% carotid stenosis.  . Mild cognitive impairment, so stated   . Memory loss     Past Surgical History  Procedure Laterality Date  . Tonsillectomy      childhood  . Dilation and curettage of uterus      years ago  . Ovarian cyst removal      at least 10 years ago  . Colonoscopy  03/10/2011    Procedure: COLONOSCOPY;  Surgeon: Rogene Houston, MD;  [facility];  Service: Endoscopy;  Laterality: N/A;  . Brain stent      There were no vitals filed for this visit.      Subjective Assessment - 02/02/16 1120    Subjective Pt reports she has been working on her exercises as home. Pt reports her back continues to hurt, but silver sneakers helped her feel better.    Pertinent History Osteopenia, CVA 2010 (balance, visual, and cognitive deficits per daughter)   Currently in Pain? No/denies   Multiple Pain Sites No                          [facility] Adult PT Treatment/Exercise - 02/02/16 0001    Ambulation/Gait   Ambulation Distance (Feet) 1115 Feet   Assistive device None   Gait velocity 0.6ms   Gait Comments Pt making an effort to perform heel strike and arm swing, needs a reminder after 5 minutes   Exercises   Exercises Shoulder;Knee/Hip   Knee/Hip Exercises: Standing   Lateral Step Up 2 sets;10 reps;Step Height: 4"  VC for avoidance of toe-out on left side.    Shoulder Exercises: Supine   Horizontal ABduction Strengthening;Both;20 reps;Theraband  supine on T6 towel roll   Theraband Level (Shoulder Horizontal ABduction) Level 2 (Red)   Other Supine Exercises Cervical retraction into 2 pillows    Other Supine Exercises supine overhead reach c PhysBall  1x15, towel roll at T6   Shoulder Exercises: Seated   Other Seated Exercises Seated overhead reach with Phys BDiona Foleyand ext over chairback   1x20   Shoulder Exercises: Standing   Row Strengthening;10 reps  2x10   Shoulder Exercises: Stretch   Other Shoulder Stretches supine towel roll stretch x5 minutes  performed during 2 other shoulder exercises.  Balance Exercises - 02/02/16 1148    Balance Exercises: Standing   Standing Eyes Closed Narrow base of support (BOS);Foam/compliant surface;3 reps;30 secs  improves with time   SLS Eyes open;3 reps;Time;30 secs  3x30sec bilat           PT Education - 02/02/16 1203    Education provided Yes   Education Details discussed some ways to incorporate soem extension oriented activity into her day.    Person(s) Educated Patient;Child(ren)   Methods Explanation;Demonstration;Tactile cues   Comprehension Verbalized understanding;Returned demonstration          PT Short Term Goals - 01/12/16 1247    PT SHORT TERM GOAL #1   Title After 2 weeks pt will demonstrate fluency with HEP for progress toward goals at home.    Status Achieved   PT SHORT TERM GOAL #2   Title After 4 weeks pt will  demonstrate good sequencing and use of SPC in a 2-point pattern to offset RLE loading to improve R foot heeling and improve safety in home.    Status Achieved   PT SHORT TERM GOAL #3   Title After 4 weeks pt will demonstrate good fluency with RW for safer access of limited community distances.    Status Deferred           PT Long Term Goals - 01/12/16 1247    PT LONG TERM GOAL #1   Title After 8 weeks patient will deonstrate 5/5 strength in BLE to demonstrate return to baseline level of function.    PT LONG TERM GOAL #2   Title After 8 weeks, patient will demonstrate > 8s SLS balance bilat to decrease risk of falls at home/community in the setting of osteopenia.    Status Partially Met   PT LONG TERM GOAL #3   Title After 8 weeks, patient will show return to baseline level of function made evident in TUG <10sec, and 6MWT >1516f to demonstrate ability to safely access the community.    Status On-going               Plan - 02/02/16 1205    Clinical Impression Statement Session focus today on improving trunk postural abnormalities contributing to balance impairment, including thoracic extension, Cervical retraction, and scapular retraction. Pt also making excellent progress in balance activities and gait speed.  Ankle contribution to balance is noted to be improved. Left glute med weakness still remains evident in Trendelenburg during gait and limiting  to balance during Left tandem stance.    Rehab Potential Good   Clinical Impairments Affecting Rehab Potential Balance and very mild cognitive impairment after remote CVA.    PT Frequency Other (comment)   PT Duration 8 weeks   PT Treatment/Interventions Gait training;Stair training;Functional mobility training;Therapeutic activities;Therapeutic exercise;Balance training;Patient/family education   PT Next Visit Plan repeat ankle strength, T6 towel roll stretch --> 829mutes, and continue to work on balance, consider retro ambulation,  and lateral stepping as well.    Consulted and Agree with Plan of Care Patient      Patient will benefit from skilled therapeutic intervention in order to improve the following deficits and impairments:  Abnormal gait, Decreased cognition, Decreased activity tolerance, Decreased balance, Decreased safety awareness, Difficulty walking, Decreased strength  Visit Diagnosis: Other abnormalities of gait and mobility  Unsteadiness on feet  Muscle weakness (generalized)     Problem List Patient Active Problem List   Diagnosis Date Noted  . History of stroke 03/03/2015  . GERD (gastroesophageal  reflux disease) 02/06/2015  . Aortic sclerosis ([place]) 12/02/2014  . Osteopenia 01/14/2014  . Prediabetes 08/05/2013  . Hypercalcemia 04/22/2013  . Renal insufficiency 02/15/2013  . Mild cognitive impairment 01/03/2013  . Memory loss 01/03/2013  . Ulcer 10/12/2012  . Hypertension 10/12/2012  . Hyperlipidemia 10/12/2012  . CVA (cerebral infarction) 10/12/2012  . HIP PAIN, RIGHT 12/29/2009    12:20 PM, 02/02/2016 Etta Grandchild, PT, DPT Physical Therapist at [place] [phone] (office)     [place] [address] [place], [place], [address] Phone: [phone]   Fax:  [phone]  Name: Becky Baird MRN: [id number] Date of Birth: February 07, 1934

## 2016-02-02 NOTE — Telephone Encounter (Signed)
Patient will be on vacation

## 2016-02-04 ENCOUNTER — Ambulatory Visit (HOSPITAL_COMMUNITY): Payer: Medicare Other

## 2016-02-04 DIAGNOSIS — R2689 Other abnormalities of gait and mobility: Secondary | ICD-10-CM

## 2016-02-04 DIAGNOSIS — M6281 Muscle weakness (generalized): Secondary | ICD-10-CM

## 2016-02-04 DIAGNOSIS — R2681 Unsteadiness on feet: Secondary | ICD-10-CM

## 2016-02-04 NOTE — Therapy (Addendum)
[place], [place], [address] Phone: [phone]   Fax:  [phone]  Physical Therapy Treatment  Patient Details  Name: Becky Baird MRN: [id number] Date of Birth: 14-Nov-1933 Referring Provider: Arther Abbott   Encounter Date: 02/04/2016      PT End of Session - 02/04/16 1055    Visit Number 10   Number of Visits 12   Date for PT Re-Evaluation 02/09/16   Authorization Type Medicare   Authorization Time Period 12/10/15-02/09/16   Authorization - Visit Number 10   Authorization - Number of Visits 20   PT Start Time 3748   PT Stop Time 1113   PT Time Calculation (min) 38 min   Equipment Utilized During Treatment Gait belt   Activity Tolerance Patient tolerated treatment well   Behavior During Therapy [facility] for tasks assessed/performed      Past Medical History  Diagnosis Date  . Stroke ([place])     2010, RMCA with stent  . Anxiety   . Hypertension   . Hyperlipidemia   . Renal cyst   . Ulcer   . Occlusion and stenosis of carotid artery with cerebral infarction     Bilateral less than 50% carotid stenosis.  . Mild cognitive impairment, so stated   . Memory loss     Past Surgical History  Procedure Laterality Date  . Tonsillectomy      childhood  . Dilation and curettage of uterus      years ago  . Ovarian cyst removal      at least 10 years ago  . Colonoscopy  03/10/2011    Procedure: COLONOSCOPY;  Surgeon: Rogene Houston, MD;  [facility];  Service: Endoscopy;  Laterality: N/A;  . Brain stent      There were no vitals filed for this visit.      Subjective Assessment - 02/04/16 1038    Subjective Pt reports she has felt better with the stretches that were added last time. No other changes since last visit.    Pertinent History Osteopenia, CVA 2010 (balance, visual, and cognitive deficits per daughter)   Currently in Pain? No/denies                          [facility] Adult PT Treatment/Exercise - 02/04/16 0001    Shoulder Exercises: Supine   Horizontal ABduction Strengthening;Both;20 reps;Theraband  supine on T6 towel roll   Theraband Level (Shoulder Horizontal ABduction) Level 2 (Red)   Flexion Strengthening;Both;Theraband  diagonals Y/I: 2x15bilat   Other Supine Exercises Cervical retraction into 2 pillows    Other Supine Exercises supine overhead reach c PhysBall  1x15, towel roll at T6   Shoulder Exercises: Standing   Row Strengthening;10 reps;Theraband  2x10   Theraband Level (Shoulder Row) Level 2 (Red)   Row Limitations narrow stance on foam             Balance Exercises - 02/04/16 1059    Balance Exercises: Standing   Standing Eyes Closed Narrow base of support (BOS);Foam/compliant surface;3 reps;30 secs  improves with time   SLS Eyes open;3 reps;Time;30 secs  3x30sec bilat   Retro Gait 5 reps  toe to heel, VC for step through gait             PT Short Term Goals - 01/12/16 1247    PT SHORT TERM GOAL #1   Title After 2 weeks pt will demonstrate  fluency with HEP for progress toward goals at home.    Status Achieved   PT SHORT TERM GOAL #2   Title After 4 weeks pt will demonstrate good sequencing and use of SPC in a 2-point pattern to offset RLE loading to improve R foot heeling and improve safety in home.    Status Achieved   PT SHORT TERM GOAL #3   Title After 4 weeks pt will demonstrate good fluency with RW for safer access of limited community distances.    Status Deferred           PT Long Term Goals - 01/12/16 1247    PT LONG TERM GOAL #1   Title After 8 weeks patient will deonstrate 5/5 strength in BLE to demonstrate return to baseline level of function.    PT LONG TERM GOAL #2   Title After 8 weeks, patient will demonstrate > 8s SLS balance bilat to decrease risk of falls at home/community in the setting of osteopenia.    Status Partially Met   PT LONG TERM GOAL #3   Title After 8 weeks, patient  will show return to baseline level of function made evident in TUG <10sec, and 6MWT >1545f to demonstrate ability to safely access the community.    Status On-going               Plan - 0Jul 24, 20171056    Clinical Impression Statement Patient making progress toward all goals; posture and balance are improving, as is functional activity tolerance. HEP is updated today. Pt reports feeling much better after exercise progression.    Rehab Potential Good   Clinical Impairments Affecting Rehab Potential Balance and very mild cognitive impairment after remote CVA.    PT Frequency Other (comment)   PT Duration 8 weeks   PT Treatment/Interventions Gait training;Stair training;Functional mobility training;Therapeutic activities;Therapeutic exercise;Balance training;Patient/family education   PT Next Visit Plan repeat ankle strength, continue T6 towel roll stretch with supine exercise. and continue to work on balance, consider retro ambulation, and lateral stepping as well.    PT Home Exercise Plan reviewed updated HEP, reports compliance daily   Consulted and Agree with Plan of Care Patient;Family member/caregiver   Family Member Consulted daughter       007/24/20171Mar 15, 1205 PT G-Codes  Functional Assessment Tool Used Clinical Judgment  Functional Limitation Mobility: Walking and moving around  Mobility: Walking and Moving Around Current Status ([phone] CI  Mobility: Walking and Moving Around Goal Status [phone] CI    Patient will benefit from skilled therapeutic intervention in order to improve the following deficits and impairments:  Abnormal gait, Decreased cognition, Decreased activity tolerance, Decreased balance, Decreased safety awareness, Difficulty walking, Decreased strength  Visit Diagnosis: Other abnormalities of gait and mobility  Unsteadiness on feet  Muscle weakness (generalized)     Problem List Patient Active Problem List   Diagnosis Date Noted  . History of stroke  03/03/2015  . GERD (gastroesophageal reflux disease) 02/06/2015  . Aortic sclerosis ([place] 12/02/2014  . Osteopenia 01/14/2014  . Prediabetes 08/05/2013  . Hypercalcemia 04/22/2013  . Renal insufficiency 02/15/2013  . Mild cognitive impairment 01/03/2013  . Memory loss 01/03/2013  . Ulcer 10/12/2012  . Hypertension 10/12/2012  . Hyperlipidemia 10/12/2012  . CVA (cerebral infarction) 10/12/2012  . HIP PAIN, RIGHT 12/29/2009    11:16 AM, 02017-07-24AEtta Grandchild PT, DPT Physical [facility]rapist [facility] [phone](office)     [place], [place], [address] Phone: [phone]   Fax:  [phone]  Name: ULONDA KLOSOWSKI MRN: [id number] Date of Birth: [birth year]  *G codes added at addendum  12:07 PM, 02/04/2016 Etta Grandchild, PT, DPT Physical Therapist at [place] [phone] (office)

## 2016-02-08 ENCOUNTER — Ambulatory Visit (HOSPITAL_COMMUNITY): Payer: Medicare Other | Admitting: Physical Therapy

## 2016-02-08 DIAGNOSIS — R2681 Unsteadiness on feet: Secondary | ICD-10-CM | POA: Diagnosis not present

## 2016-02-08 DIAGNOSIS — R2689 Other abnormalities of gait and mobility: Secondary | ICD-10-CM | POA: Diagnosis not present

## 2016-02-08 DIAGNOSIS — M6281 Muscle weakness (generalized): Secondary | ICD-10-CM

## 2016-02-08 NOTE — Therapy (Signed)
[place], [place], [address] Phone: [phone]   Fax:  [phone]  Physical Therapy Treatment  Patient Details  Name: Becky Baird MRN: [id number] Date of Birth: 1933-10-09 Referring Provider: Arther Abbott, MD  Encounter Date: 02/08/2016      PT End of Session - 02/08/16 1212    Visit Number 11   Number of Visits 20   Date for PT Re-Evaluation 03/11/16   Authorization Type Medicare   Authorization Time Period 12/10/15-02/09/16 new 02/10/16 to 03/11/16   Authorization - Visit Number 11   Authorization - Number of Visits 20   PT Start Time 1114   PT Stop Time 1200   PT Time Calculation (min) 46 min   Equipment Utilized During Treatment Gait belt   Activity Tolerance Patient tolerated treatment well   Behavior During Therapy [facility] for tasks assessed/performed      Past Medical History  Diagnosis Date  . Stroke ([place])     2010, RMCA with stent  . Anxiety   . Hypertension   . Hyperlipidemia   . Renal cyst   . Ulcer   . Occlusion and stenosis of carotid artery with cerebral infarction     Bilateral less than 50% carotid stenosis.  . Mild cognitive impairment, so stated   . Memory loss     Past Surgical History  Procedure Laterality Date  . Tonsillectomy      childhood  . Dilation and curettage of uterus      years ago  . Ovarian cyst removal      at least 10 years ago  . Colonoscopy  03/10/2011    Procedure: COLONOSCOPY;  Surgeon: Rogene Houston, MD;  [facility];  Service: Endoscopy;  Laterality: N/A;  . Brain stent      There were no vitals filed for this visit.      Subjective Assessment - 02/08/16 1118    Subjective Pt reports she feels she has made a great deal of improvement since beginning PT. She occasionally will feel a "little sting" in her foot, but it is not nearly as bad. She feels she would continue to benefit from PT.    Pertinent History Osteopenia, CVA 2010  (balance, visual, and cognitive deficits per daughter)   Limitations House hold activities  push mower/riding mower   How long can you stand comfortably? 45 minutes    How long can you walk comfortably? 1 hour   Diagnostic tests Xrays, Bone Scan   Patient Stated Goals return to normal life, high activity levels   Currently in Pain? No/denies            [facility] PT Assessment - 02/08/16 0001    Assessment   Medical Diagnosis R foot met fractures   Referring Provider Arther Abbott, MD   Onset Date/Surgical Date 10/10/15   Next MD Visit none    Prior Therapy None   Precautions   Precautions Other (comment)  Asked to use a [facility]   Required Braces or Orthoses --  Right Aircast boot, excepts for bathing anf sleeping x6w   Restrictions   Weight Bearing Restrictions No   RLE Weight Bearing --  none listed in MD note; pt/family report verbal restrictions   Balance Screen   Has the patient fallen in the past 6 months No  not since beginning PT   Has the patient had a decrease in activity level because of a fear of falling?  No   Is the patient reluctant to leave their home because of a fear of falling?  No   Prior Function   Level of Independence Independent   Vocation Retired   Associate Professor   Overall Cognitive Status History of cognitive impairments - at baseline  self reported impairment from CVA 2010   Sensation   Light Touch Appears Intact   Functional Tests   Functional tests --  Limited appropriateness due to current restrictions   Strength   Right Ankle Dorsiflexion 5/5   Right Ankle Plantar Flexion 4+/5  standing    Left Ankle Dorsiflexion 5/5   Left Ankle Plantar Flexion 4+/5  standing    Ambulation/Gait   Ambulation/Gait Yes   Ambulation/Gait Assistance --   Ambulation Distance (Feet) --   Assistive device None   Gait Pattern --  3 point, with SOC in LUE   Gait Comments Pt no longer using SPC.    6 Minute Walk- Baseline   6 Minute Walk- Baseline yes  Limited  appropriateness due to current restrictions   6 minute walk test results    Aerobic Endurance Distance Walked 1256  no AD, noting Rt trunk lean progression through activity   Standardized Balance Assessment   Standardized Balance Assessment Berg Balance Test;Timed Up and Go Test   Berg Balance Test   Sit to Stand Able to stand  independently using hands   Standing Unsupported Able to stand safely 2 minutes   Sitting with Back Unsupported but Feet Supported on Floor or Stool Able to sit safely and securely 2 minutes   Stand to Sit Sits safely with minimal use of hands   Transfers Able to transfer safely, minor use of hands   Standing Unsupported with Eyes Closed Able to stand 10 seconds safely   Berg comment: not finished due to time constraints    Timed Up and Go Test   TUG Comments 10.5 sec, no AD  unsteadiness with turning around cone   High Level Balance   High Level Balance Comments SLS: R ~5 sec, increased trunk/hip/knee flexion/ Lt: 3-13 sec                             PT Education - 02/08/16 1211    Education provided Yes   Education Details discussed goals/POC; discussed possible causes for trunk lean and importance of core strength to improve posture during activity; encouraged HEP continued HEP adherence   Person(s) Educated Patient;Child(ren)   Methods Explanation;Demonstration;Verbal cues   Comprehension Verbalized understanding;Returned demonstration          PT Short Term Goals - 02/08/16 1139    PT SHORT TERM GOAL #1   Title After 2 weeks pt will demonstrate fluency with HEP for progress toward goals at home.    Status Achieved   PT SHORT TERM GOAL #2   Title After 4 weeks pt will demonstrate good sequencing and use of SPC in a 2-point pattern to offset RLE loading to improve R foot heeling and improve safety in home.    Status Achieved   PT SHORT TERM GOAL #3   Title After 4 weeks pt will demonstrate good fluency with RW for safer access  of limited community distances.    Status Deferred           PT Long Term Goals - 02/08/16 1139    PT LONG TERM GOAL #1   Title After 8 weeks patient will  deonstrate 5/5 strength in BLE to demonstrate return to baseline level of function.    Baseline 4+ BLE   Status Partially Met   PT LONG TERM GOAL #2   Title After 8 weeks, patient will demonstrate > 8s SLS balance bilat to decrease risk of falls at home/community in the setting of osteopenia.    Baseline L: 13 sec max, R: 5 sec max (increased trunk flexion)   Status Partially Met   PT LONG TERM GOAL #3   Title After 8 weeks, patient will show return to baseline level of function made evident in TUG <10sec, and 6MWT >1591f to demonstrate ability to safely access the community.    Baseline 6MWT: 1256 ft no AD; TUG: 10.5 sec, No AD;   Status On-going               Plan - 02/08/16 1213    Clinical Impression Statement Pt was reassessed this visit having met several of her goals and showing progression towards all others. She reports improved activity tolerance and consistent adherence to HEP along with greater than 50% improvement in functional status since beginning PT.  She demonstrates improved ankle strength and functional endurance/balance evident by her TUG and 6MWT. She reports pain with plantarflexion activity, however this is much better than it has been. Noting limitations remaining in balance as well as flexed posturing to the Rt during functional activity. It is unclear whether this is a result of her CVA back in 2010 or from use of the boot, but her daughter reports it as fairly new. This places her at an increased risk of falls and injury. She would benefit from a few more weeks of skilled PT to address remaining limitations and improve independence/safety at home and in the community.     Rehab Potential Good   Clinical Impairments Affecting Rehab Potential Balance and very mild cognitive impairment after remote CVA.     PT Frequency 2x / week   PT Duration 4 weeks   PT Treatment/Interventions Gait training;Stair training;Functional mobility training;Therapeutic activities;Therapeutic exercise;Balance training;Patient/family education;Neuromuscular re-education;Passive range of motion;Manual techniques   PT Next Visit Plan repeat ankle strength, continue T6 towel roll stretch with supine exercise. and continue to work on balance, consider retro ambulation, and lateral stepping as well.    PT Home Exercise Plan no updates this visit; encouraged adherence to previously given HEP   Recommended Other Services none    Consulted and Agree with Plan of Care Patient;Family member/caregiver   Family Member Consulted daughter      Patient will benefit from skilled therapeutic intervention in order to improve the following deficits and impairments:  Abnormal gait, Decreased cognition, Decreased activity tolerance, Decreased balance, Decreased safety awareness, Difficulty walking, Decreased strength  Visit Diagnosis: Other abnormalities of gait and mobility  Unsteadiness on feet  Muscle weakness (generalized)     Problem List Patient Active Problem List   Diagnosis Date Noted  . History of stroke 03/03/2015  . GERD (gastroesophageal reflux disease) 02/06/2015  . Aortic sclerosis ([place] 12/02/2014  . Osteopenia 01/14/2014  . Prediabetes 08/05/2013  . Hypercalcemia 04/22/2013  . Renal insufficiency 02/15/2013  . Mild cognitive impairment 01/03/2013  . Memory loss 01/03/2013  . Ulcer 10/12/2012  . Hypertension 10/12/2012  . Hyperlipidemia 10/12/2012  . CVA (cerebral infarction) 10/12/2012  . HIP PAIN, RIGHT 12/29/2009   12:35 PM,02/08/2016 SElly ModenaPT, DPT [facility] [id number]  [place], [place], [address] Phone: [phone]   Fax:  [phone]  Name: Becky Baird MRN: [id number] Date of  Birth: June 01, 1934

## 2016-02-09 ENCOUNTER — Telehealth (HOSPITAL_COMMUNITY): Payer: Self-pay

## 2016-02-09 ENCOUNTER — Encounter (HOSPITAL_COMMUNITY): Payer: Medicare Other | Admitting: Physical Therapy

## 2016-02-09 NOTE — Telephone Encounter (Signed)
Patient is not available

## 2016-02-10 ENCOUNTER — Ambulatory Visit (HOSPITAL_COMMUNITY): Payer: Medicare Other

## 2016-02-11 ENCOUNTER — Encounter (HOSPITAL_COMMUNITY): Payer: Medicare Other

## 2016-02-16 ENCOUNTER — Ambulatory Visit (HOSPITAL_COMMUNITY): Payer: Medicare Other

## 2016-02-18 ENCOUNTER — Ambulatory Visit (HOSPITAL_COMMUNITY): Payer: Medicare Other

## 2016-02-18 ENCOUNTER — Encounter (INDEPENDENT_AMBULATORY_CARE_PROVIDER_SITE_OTHER): Payer: Self-pay | Admitting: *Deleted

## 2016-02-18 DIAGNOSIS — M6281 Muscle weakness (generalized): Secondary | ICD-10-CM

## 2016-02-18 DIAGNOSIS — R2689 Other abnormalities of gait and mobility: Secondary | ICD-10-CM | POA: Diagnosis not present

## 2016-02-18 DIAGNOSIS — R2681 Unsteadiness on feet: Secondary | ICD-10-CM

## 2016-02-18 NOTE — Therapy (Signed)
[place], [place], [address] Phone: [phone]   Fax:  [phone]  Physical Therapy Treatment  Patient Details  Name: Becky Baird MRN: [id number] Date of Birth: [birth year] Referring Provider: Arther Abbott, MD  Encounter Date: 02/18/2016      PT End of Session - 02/18/16 1039    Visit Number 12   Number of Visits 20   Date for PT Re-Evaluation 03/11/16   Authorization Type Medicare   Authorization Time Period 12/10/15-02/09/16 new 02/10/16 to 03/11/16   Authorization - Visit Number 12   Authorization - Number of Visits 20   PT Start Time 0945   PT Stop Time 1028   PT Time Calculation (min) 43 min   Activity Tolerance Patient tolerated treatment well   Behavior During Therapy [facility] for tasks assessed/performed      Past Medical History:  Diagnosis Date  . Anxiety   . Hyperlipidemia   . Hypertension   . Memory loss   . Mild cognitive impairment, so stated   . Occlusion and stenosis of carotid artery with cerebral infarction    Bilateral less than 50% carotid stenosis.  . Renal cyst   . Stroke ([place])    2010, RMCA with stent  . Ulcer     Past Surgical History:  Procedure Laterality Date  . brain stent    . COLONOSCOPY  03/10/2011   Procedure: COLONOSCOPY;  Surgeon: Rogene Houston, MD;  [facility];  Service: Endoscopy;  Laterality: N/A;  . DILATION AND CURETTAGE OF UTERUS     years ago  . OVARIAN CYST REMOVAL     at least 10 years ago  . TONSILLECTOMY     childhood    There were no vitals filed for this visit.      Subjective Assessment - 02/18/16 0958    Subjective Pt reports all is goign well. She has been working on HEP at home, but cannot find the most recent update. She does still have her theraband.    Pertinent History Osteopenia, CVA 2010 (balance, visual, and cognitive deficits per daughter)                         [facility] Adult PT  Treatment/Exercise - 02/18/16 0001      Ambulation/Gait   Ambulation/Gait Yes   Ambulation Distance (Feet) 1056 Feet   Ambulation Surface --  treadmill   Gait Comments First time on Treadmill; gait quality very poor due to unfamiliarity and limited confidence   will stick to over-ground training exclusively in future.      Knee/Hip Exercises: Stretches   Other Knee/Hip Stretches seated in cahir overhead ball and extension x20  20x   Other Knee/Hip Stretches Lying in prone  hip flexor stretch x 5 minutes.      Knee/Hip Exercises: Standing   Heel Raises 15 reps;1 set   Heel Raises Limitations --  in dorsiflexion   Lateral Step Up 10 reps;Step Height: 6";1 set  CGA, VC for upright posture   Forward Step Up 1 set;Step Height: 6";10 reps;Both  CGA, VC for upright posture     Shoulder Exercises: Seated   Horizontal ABduction 15 reps   Theraband Level (Shoulder Horizontal ABduction) Level 2 (Red)   Horizontal ABduction Limitations seated towel roll behind back    Flexion 15 reps  overhead reach with yellow weighted abll, towel roll     Manual Therapy  Manual Therapy Joint mobilization   Joint Mobilization Prone Hip PA glide mob c movement  combined c PROM hip ext bilat 1x60sec each                PT Education - 02/18/16 1038    Education provided Yes   Education Details Explained how hip flexor tightness is limiting posture in stance and gait. Recommending time in prone.    Person(s) Educated Patient   Methods Explanation;Demonstration;Tactile cues   Comprehension Verbalized understanding          PT Short Term Goals - 02/08/16 1139      PT SHORT TERM GOAL #1   Title After 2 weeks pt will demonstrate fluency with HEP for progress toward goals at home.    Status Achieved     PT SHORT TERM GOAL #2   Title After 4 weeks pt will demonstrate good sequencing and use of SPC in a 2-point pattern to offset RLE loading to improve R foot heeling and improve safety in  home.    Status Achieved     PT SHORT TERM GOAL #3   Title After 4 weeks pt will demonstrate good fluency with RW for safer access of limited community distances.    Status Deferred           PT Long Term Goals - 02/08/16 1139      PT LONG TERM GOAL #1   Title After 8 weeks patient will deonstrate 5/5 strength in BLE to demonstrate return to baseline level of function.    Baseline 4+ BLE   Status Partially Met     PT LONG TERM GOAL #2   Title After 8 weeks, patient will demonstrate > 8s SLS balance bilat to decrease risk of falls at home/community in the setting of osteopenia.    Baseline L: 13 sec max, R: 5 sec max (increased trunk flexion)   Status Partially Met     PT LONG TERM GOAL #3   Title After 8 weeks, patient will show return to baseline level of function made evident in TUG <10sec, and 6MWT >1576f to demonstrate ability to safely access the community.    Baseline 6MWT: 1256 ft no AD; TUG: 10.5 sec, No AD;   Status On-going               Plan - 02/18/16 1040    Clinical Impression Statement Pt toelrating session well today, with advancement of postural control work, postural mobility, and dynamic stability during gait and stepping. Treadmill gait training was very challenging, requring too many verbal/tactile cues and without ability to self correct. Treadmill would best be avoided in future sessions, as gait is faster and with more symmetry and control on ground. Pt tolerates prone stretch time well, but gets more stretch in lumbar spine than hips.    Rehab Potential Good   Clinical Impairments Affecting Rehab Potential Balance and very mild cognitive impairment after remote CVA.    PT Frequency 2x / week   PT Duration 4 weeks   PT Treatment/Interventions Gait training;Stair training;Functional mobility training;Therapeutic activities;Therapeutic exercise;Balance training;Patient/family education;Neuromuscular re-education;Passive range of motion;Manual  techniques   PT Next Visit Plan repeat ankle strength, continue T6 towel roll stretch with supine exercise. and continue to work on balance, consider retro ambulation, and lateral stepping as well.    PT Home Exercise Plan no updates this visit; encouraged adherence to previously given HEP   Consulted and Agree with Plan of Care Patient;Family member/caregiver  Family Member Consulted daughters      Patient will benefit from skilled therapeutic intervention in order to improve the following deficits and impairments:  Abnormal gait, Decreased cognition, Decreased activity tolerance, Decreased balance, Decreased safety awareness, Difficulty walking, Decreased strength  Visit Diagnosis: Other abnormalities of gait and mobility  Unsteadiness on feet  Muscle weakness (generalized)     Problem List Patient Active Problem List   Diagnosis Date Noted  . History of stroke 03/03/2015  . GERD (gastroesophageal reflux disease) 02/06/2015  . Aortic sclerosis ([place]) 12/02/2014  . Osteopenia 01/14/2014  . Prediabetes 08/05/2013  . Hypercalcemia 04/22/2013  . Renal insufficiency 02/15/2013  . Mild cognitive impairment 01/03/2013  . Memory loss 01/03/2013  . Ulcer 10/12/2012  . Hypertension 10/12/2012  . Hyperlipidemia 10/12/2012  . CVA (cerebral infarction) 10/12/2012  . HIP PAIN, RIGHT 12/29/2009    12:50 PM, 02/18/16 Etta Grandchild, PT, DPT Physical Therapist at [place] [phone] (office)     [place] [address] [place], [place], [address] Phone: [phone]   Fax:  [phone]  Name: Becky Baird MRN: [id number] Date of Birth: 20-Apr-1934

## 2016-02-23 ENCOUNTER — Ambulatory Visit (HOSPITAL_COMMUNITY): Payer: Medicare Other | Attending: Orthopedic Surgery

## 2016-02-23 DIAGNOSIS — M6281 Muscle weakness (generalized): Secondary | ICD-10-CM

## 2016-02-23 DIAGNOSIS — R2681 Unsteadiness on feet: Secondary | ICD-10-CM | POA: Insufficient documentation

## 2016-02-23 DIAGNOSIS — R2689 Other abnormalities of gait and mobility: Secondary | ICD-10-CM | POA: Diagnosis not present

## 2016-02-23 NOTE — Therapy (Addendum)
[place], [place], [address] Phone: [phone]   Fax:  [phone]  Physical Therapy Treatment  Patient Details  Name: Becky Baird MRN: [id number] Date of Birth: 01/06/1934 Referring Provider: Arther Abbott, MD  Encounter Date: 02/23/2016      PT End of Session - 02/23/16 0955    Visit Number 13   Number of Visits 20   Date for PT Re-Evaluation 03/11/16   Authorization Type Medicare   Authorization Time Period 12/10/15-02/09/16 new 02/10/16 to 03/11/16   Authorization - Visit Number 13   Authorization - Number of Visits 20   PT Start Time 0947   PT Stop Time 1032   PT Time Calculation (min) 45 min   Equipment Utilized During Treatment Gait belt   Activity Tolerance Patient tolerated treatment well   Behavior During Therapy [facility] for tasks assessed/performed      Past Medical History:  Diagnosis Date  . Anxiety   . Hyperlipidemia   . Hypertension   . Memory loss   . Mild cognitive impairment, so stated   . Occlusion and stenosis of carotid artery with cerebral infarction    Bilateral less than 50% carotid stenosis.  . Renal cyst   . Stroke ([place])    2010, RMCA with stent  . Ulcer     Past Surgical History:  Procedure Laterality Date  . brain stent    . COLONOSCOPY  03/10/2011   Procedure: COLONOSCOPY;  Surgeon: Rogene Houston, MD;  [facility];  Service: Endoscopy;  Laterality: N/A;  . DILATION AND CURETTAGE OF UTERUS     years ago  . OVARIAN CYST REMOVAL     at least 10 years ago  . TONSILLECTOMY     childhood    There were no vitals filed for this visit.      Subjective Assessment - 02/23/16 0954    Subjective No reports of current pain today. Reports most difficulty with walking and hip strengthening.   Pertinent History Osteopenia, CVA 2010 (balance, visual, and cognitive deficits per daughter)   Patient Stated Goals return to normal life, high activity levels   Currently in Pain? No/denies                         [facility] Adult PT Treatment/Exercise - 02/23/16 0001      Ambulation/Gait   Ambulation/Gait Yes   Ambulation/Gait Assistance 5: Supervision   Ambulation Distance (Feet) 680 Feet  2 sets   Assistive device --  Utilized bars in Bil UE to improve sequence with gait   Gait Pattern Step-through pattern;Decreased stance time - right;Decreased step length - left;Decreased dorsiflexion - left;Decreased weight shift to right  Trunk lateral flexed to Rt; forward flexed   Ambulation Surface Level;Indoor     Knee/Hip Exercises: Stretches   Other Knee/Hip Stretches Thoracic extension   Other Knee/Hip Stretches POE x 2 min     Knee/Hip Exercises: Standing   Heel Raises 15 reps;1 set   Heel Raises Limitations heel and toe on slope with plantar flexion   Lateral Step Up Both;15 reps;Step Height: 6"   Forward Step Up 15 reps;Hand Hold: 1;Step Height: 6"     Knee/Hip Exercises: Supine   Other Supine Knee/Hip Exercises Towel under t6 x 2 min   Other Supine Knee/Hip Exercises Horizontal abd with RTB 2x 15     Knee/Hip Exercises: Prone   Straight Leg Raises Limitations POE  x 2 min                  PT Short Term Goals - 02/08/16 1139      PT SHORT TERM GOAL #1   Title After 2 weeks pt will demonstrate fluency with HEP for progress toward goals at home.    Status Achieved     PT SHORT TERM GOAL #2   Title After 4 weeks pt will demonstrate good sequencing and use of SPC in a 2-point pattern to offset RLE loading to improve R foot heeling and improve safety in home.    Status Achieved     PT SHORT TERM GOAL #3   Title After 4 weeks pt will demonstrate good fluency with RW for safer access of limited community distances.    Status Deferred           PT Long Term Goals - 02/08/16 1139      PT LONG TERM GOAL #1   Title After 8 weeks patient will deonstrate 5/5 strength in BLE to demonstrate return to baseline  level of function.    Baseline 4+ BLE   Status Partially Met     PT LONG TERM GOAL #2   Title After 8 weeks, patient will demonstrate > 8s SLS balance bilat to decrease risk of falls at home/community in the setting of osteopenia.    Baseline L: 13 sec max, R: 5 sec max (increased trunk flexion)   Status Partially Met     PT LONG TERM GOAL #3   Title After 8 weeks, patient will show return to baseline level of function made evident in TUG <10sec, and 6MWT >1552f to demonstrate ability to safely access the community.    Baseline 6MWT: 1256 ft no AD; TUG: 10.5 sec, No AD;   Status On-going               Plan - 02/23/16 1040    Clinical Impression Statement Session focus on improving gait mechanics incorporating postural awareness, mobility and strengthening as well as functional strengthening for LE.  Pt required moderate cueing to improve gait mechanics with verbal and tactile cueing to improve dorsiflexion with heel strike, equalize stride length and reduce Rt sided lateral flexed trunk to improve posture.  Pt given advanced HEP to improve postural awareness and strengthening.  No reports of pain through session.   Rehab Potential Good   Clinical Impairments Affecting Rehab Potential Balance and very mild cognitive impairment after remote CVA.    PT Frequency 2x / week   PT Duration 4 weeks   PT Treatment/Interventions Gait training;Stair training;Functional mobility training;Therapeutic activities;Therapeutic exercise;Balance training;Patient/family education;Neuromuscular re-education;Passive range of motion;Manual techniques   PT Next Visit Plan repeat ankle strength, continue T6 towel roll stretch with supine exercise. and continue to work on balance, consider retro ambulation, and lateral stepping as well.    PT Home Exercise Plan Given update with HEP      Patient will benefit from skilled therapeutic intervention in order to improve the following deficits and impairments:   Abnormal gait, Decreased cognition, Decreased activity tolerance, Decreased balance, Decreased safety awareness, Difficulty walking, Decreased strength  Visit Diagnosis: Other abnormalities of gait and mobility  Unsteadiness on feet  Muscle weakness (generalized)     Problem List Patient Active Problem List   Diagnosis Date Noted  . History of stroke 03/03/2015  . GERD (gastroesophageal reflux disease) 02/06/2015  . [place] 12/02/2014  . Osteopenia 01/14/2014  . Prediabetes  08/05/2013  . Hypercalcemia 04/22/2013  . Renal insufficiency 02/15/2013  . Mild cognitive impairment 01/03/2013  . Memory loss 01/03/2013  . Ulcer 10/12/2012  . Hypertension 10/12/2012  . Hyperlipidemia 10/12/2012  . CVA (cerebral infarction) 10/12/2012  . HIP PAIN, RIGHT 12/29/2009   Ihor Austin, [place]; [place]  Aldona Lento 02/23/2016, 12:56 PM  [place] [address] [place], [place], [address] Phone: [phone]   Fax:  [phone]  Name: DEATRA MCMAHEN MRN: [id number] Date of Birth: Aug 09, 1933

## 2016-02-25 ENCOUNTER — Encounter (HOSPITAL_COMMUNITY): Payer: Medicare Other

## 2016-02-25 ENCOUNTER — Telehealth (HOSPITAL_COMMUNITY): Payer: Self-pay

## 2016-02-26 NOTE — Telephone Encounter (Signed)
Daughter called to cx

## 2016-02-29 ENCOUNTER — Ambulatory Visit (HOSPITAL_COMMUNITY): Payer: Medicare Other | Admitting: Physical Therapy

## 2016-02-29 DIAGNOSIS — M6281 Muscle weakness (generalized): Secondary | ICD-10-CM

## 2016-02-29 DIAGNOSIS — R2689 Other abnormalities of gait and mobility: Secondary | ICD-10-CM

## 2016-02-29 DIAGNOSIS — R2681 Unsteadiness on feet: Secondary | ICD-10-CM

## 2016-02-29 NOTE — Therapy (Signed)
[place], [place], [address] Phone: [phone]   Fax:  [phone]  Physical Therapy Treatment  Patient Details  Name: Becky Baird MRN: [id number] Date of Birth: 1934-06-16 Referring Provider: Arther Abbott, MD  Encounter Date: 02/29/2016      PT End of Session - 02/29/16 1033    Visit Number 13   Number of Visits 20   Date for PT Re-Evaluation 03/11/16   Authorization Type Medicare   Authorization Time Period 12/10/15-02/09/16 new 02/10/16 to 03/11/16   Authorization - Visit Number 13   Authorization - Number of Visits 20   PT Start Time 0950   PT Stop Time 1032   PT Time Calculation (min) 42 min   Activity Tolerance Patient tolerated treatment well   Behavior During Therapy [facility] for tasks assessed/performed      Past Medical History:  Diagnosis Date  . Anxiety   . Hyperlipidemia   . Hypertension   . Memory loss   . Mild cognitive impairment, so stated   . Occlusion and stenosis of carotid artery with cerebral infarction    Bilateral less than 50% carotid stenosis.  . Renal cyst   . Stroke ([place])    2010, RMCA with stent  . Ulcer     Past Surgical History:  Procedure Laterality Date  . brain stent    . COLONOSCOPY  03/10/2011   Procedure: COLONOSCOPY;  Surgeon: Rogene Houston, MD;  [facility];  Service: Endoscopy;  Laterality: N/A;  . DILATION AND CURETTAGE OF UTERUS     years ago  . OVARIAN CYST REMOVAL     at least 10 years ago  . TONSILLECTOMY     childhood    There were no vitals filed for this visit.      Subjective Assessment - 02/29/16 1033    Subjective Pt states she is doing well.  Went to her grand daughters baby shower over the weekend.  No pain currently.                          [place] Adult PT Treatment/Exercise - 02/29/16 0001      Knee/Hip Exercises: Standing   Heel Raises 15 reps;1 set   Heel Raises Limitations heel and toe    Lateral Step Up Both;15 reps;Step Height: 6"   Forward Step Up 15 reps;Hand Hold: 1;Step Height: 6"   Gait Training Heel to toe pattern and appropriate arm swing, upright posturing 6 minutes   Other Standing Knee Exercises UE flexion against wall 10 reps    Other Standing Knee Exercises tandem gait, retro gait, side stepping 2RT      Knee/Hip Exercises: Prone   Straight Leg Raises Both;10 reps   Straight Leg Raises Limitations POE x 2 min     Shoulder Exercises: Seated   Other Seated Exercises UE flexion 10 reps                   PT Short Term Goals - 02/08/16 1139      PT SHORT TERM GOAL #1   Title After 2 weeks pt will demonstrate fluency with HEP for progress toward goals at home.    Status Achieved     PT SHORT TERM GOAL #2   Title After 4 weeks pt will demonstrate good sequencing and use of SPC in a 2-point pattern to offset RLE loading to improve R foot heeling and  improve safety in home.    Status Achieved     PT SHORT TERM GOAL #3   Title After 4 weeks pt will demonstrate good fluency with RW for safer access of limited community distances.    Status Deferred           PT Long Term Goals - 02/08/16 1139      PT LONG TERM GOAL #1   Title After 8 weeks patient will deonstrate 5/5 strength in BLE to demonstrate return to baseline level of function.    Baseline 4+ BLE   Status Partially Met     PT LONG TERM GOAL #2   Title After 8 weeks, patient will demonstrate > 8s SLS balance bilat to decrease risk of falls at home/community in the setting of osteopenia.    Baseline L: 13 sec max, R: 5 sec max (increased trunk flexion)   Status Partially Met     PT LONG TERM GOAL #3   Title After 8 weeks, patient will show return to baseline level of function made evident in TUG <10sec, and 6MWT >1577f to demonstrate ability to safely access the community.    Baseline 6MWT: 1256 ft no AD; TUG: 10.5 sec, No AD;   Status On-going               Plan -  02/29/16 1034    Clinical Impression Statement continued with focus on postural awareness/strengthening with added addition of balance this session.  Pt required verbal and tactile cues to extend knee with gait and actvities and keep upright posturing.  Tends to forward flex to the right side but reports she is more aware of it and trying to self correct.  Added hip extension in prone with noted weakness and added UE flexion against well all to improve posturing.    POE continues to be beneficial for pateint as well with encouragement to complete at home.     Rehab Potential Good   Clinical Impairments Affecting Rehab Potential Balance and very mild cognitive impairment after remote CVA.    PT Frequency 2x / week   PT Duration 4 weeks   PT Treatment/Interventions Gait training;Stair training;Functional mobility training;Therapeutic activities;Therapeutic exercise;Balance training;Patient/family education;Neuromuscular re-education;Passive range of motion;Manual techniques   PT Next Visit Plan Continue to work on balance activites.  Add SLS next session.    PT Home Exercise Plan no updates this visit; encouraged adherence to previously given HEP   Consulted and Agree with Plan of Care Patient;Family member/caregiver   Family Member Consulted daughters      Patient will benefit from skilled therapeutic intervention in order to improve the following deficits and impairments:  Abnormal gait, Decreased cognition, Decreased activity tolerance, Decreased balance, Decreased safety awareness, Difficulty walking, Decreased strength  Visit Diagnosis: Other abnormalities of gait and mobility  Unsteadiness on feet  Muscle weakness (generalized)     Problem List Patient Active Problem List   Diagnosis Date Noted  . History of stroke 03/03/2015  . GERD (gastroesophageal reflux disease) 02/06/2015  . Aortic sclerosis ([place] 12/02/2014  . Osteopenia 01/14/2014  . Prediabetes 08/05/2013  .  Hypercalcemia 04/22/2013  . Renal insufficiency 02/15/2013  . Mild cognitive impairment 01/03/2013  . Memory loss 01/03/2013  . Ulcer 10/12/2012  . Hypertension 10/12/2012  . Hyperlipidemia 10/12/2012  . CVA (cerebral infarction) 10/12/2012  . HIP PAIN, RIGHT 12/29/2009    ATeena Irani PTA/CLT [phone]/01/2016, 11:00 AM  [email or web address]  [place], [place], [address] Phone: [phone]   Fax:  [phone]  Name: Becky Baird MRN: [id number] Date of Birth: [birth year]

## 2016-03-01 ENCOUNTER — Other Ambulatory Visit: Payer: Self-pay | Admitting: Nurse Practitioner

## 2016-03-01 ENCOUNTER — Encounter: Payer: Self-pay | Admitting: Nurse Practitioner

## 2016-03-01 ENCOUNTER — Ambulatory Visit (INDEPENDENT_AMBULATORY_CARE_PROVIDER_SITE_OTHER): Payer: Medicare Other | Admitting: Nurse Practitioner

## 2016-03-01 ENCOUNTER — Other Ambulatory Visit (HOSPITAL_COMMUNITY): Payer: Self-pay | Admitting: Diagnostic Radiology

## 2016-03-01 ENCOUNTER — Telehealth: Payer: Self-pay | Admitting: Nurse Practitioner

## 2016-03-01 VITALS — BP 133/67 | HR 53 | Ht 65.5 in | Wt 159.0 lb

## 2016-03-01 DIAGNOSIS — I10 Essential (primary) hypertension: Secondary | ICD-10-CM | POA: Diagnosis not present

## 2016-03-01 DIAGNOSIS — E785 Hyperlipidemia, unspecified: Secondary | ICD-10-CM | POA: Diagnosis not present

## 2016-03-01 DIAGNOSIS — Z8673 Personal history of transient ischemic attack (TIA), and cerebral infarction without residual deficits: Secondary | ICD-10-CM | POA: Diagnosis not present

## 2016-03-01 DIAGNOSIS — I159 Secondary hypertension, unspecified: Secondary | ICD-10-CM

## 2016-03-01 NOTE — Telephone Encounter (Signed)
Resend note to [employer]

## 2016-03-01 NOTE — Patient Instructions (Signed)
Continue Aspirin at current dose for secondary stroke prevention Strict control of lipids with LDL cholesterol goal below 100. Followed by PCP Strict control of hypertension with blood pressure goal below 130/90 todays reading 133/67 Continue exercise program at [employer] after PT concluded Schedule repeat CUS at [place] discharge

## 2016-03-01 NOTE — Progress Notes (Signed)
[facility]  PATIENT: Becky Baird DOB: August 01, 1933   REASON FOR VISIT: Follow-up for history of stroke, mild cognitive impairment HISTORY FROM: Patient and daughter    HISTORY OF PRESENT ILLNESS:PSMs. Becky Baird is a 82 year old Caucasian female with history of RMCA branch infarct on Nov 2010 with RICA stenosis s/p randomization into SAMMPRIS trial with PTA stent 06/11/09.  01/04/13 Here for follow up visit with repeat carotid dopplers and TCD. Patient reports she is doing well, exercising regularly and working in the yard often including mowing her lawn. Reports some balance instability but no falls.  No new neurological complaints. Feels that short term memory has improved. States that recent cholesterol lab work was good and BP is running 120-130/60-70 at home. Patient denies medication side effects, with no signs of bleeding.  UPDATE 07/01/13 (LL): Becky Baird returns for stroke follow up. Repeat Carotid doppler study in June 2014 was negative for hemodynamically significant stenosis. Repeat TCD's showed mild stenosis, more significant on the right. She reports that she has been well, exercises regularly at [employer]. BP is well controlled and lipids are controlled with red yeast rice. Patient denies medication side effects, with no signs of bleeding.   UPDATE 03/03/2015 CM Since last visit, patient has been well, no recurrent stroke symptoms. Recent labs in August 2016  show LDL of 110 and Hgb A1c 6.She has a history of statin intolerance. Blood pressure is well controlled, it is 133/60 in the office today. Last carotid doppler in June 2014 was negative for hemodynamically significant stenosis. She is tolerating daily aspirin well without significant bleeding or bruising. Still exercising at aerobics 3x per week. At [employer]. She reports that her mild cognitive impairment stable. She continues to drive without difficulty. She is independent in all  activities of daily living. She returns for reevaluation UPDATE 08/08/2017CM Becky Baird, 82 year old female returns for follow-up. She has  history of RMCA branch infarct on Nov 2010 with RICA stenosis s/p randomization into SAMMPRIS trial with PTA stent 06/11/09. She is currently on aspirin without further stroke or TIA symptoms. Blood pressure is well controlled in the office today at 133/67. Lipids are followed by her primary care. She has been intolerant to statins in the past. She is currently going to physical therapy for a fracture of the toe. She fractured this doing yardwork. Last carotid Doppler in June 2014 was negative for significant stenosis. She was going to [employer]. Her mild cognitive impairment is stable she remains independent in all activities of daily living. She returns for reevaluation REVIEW OF SYSTEMS: Full 14 system review of systems performed and notable only for those listed, all others are neg:  Constitutional: neg  Cardiovascular: neg Ear/Nose/Throat: neg  Skin: neg Eyes: neg Respiratory: neg Gastroitestinal: neg  Hematology/Lymphatic: neg  Endocrine: neg Musculoskeletal: Walking difficulty Allergy/Immunology: Environmental allergies Neurological: neg Psychiatric: neg Sleep : neg   ALLERGIES: Allergies  Allergen Reactions  . Niaspan [Niacin Er]     rash    HOME MEDICATIONS: Outpatient Medications Prior to Visit  Medication Sig Dispense Refill  . ALPRAZolam (XANAX) 0.5 MG tablet TAKE 1/2 TABLET BY MOUTH EVERY 6 HOURS AS NEEDED FOR ANXIETY AND TAKE 1/2 TO 1 TABLET AT BEDTIME AS NEEDED FOR SLEEP 45 tablet 2  . aspirin 81 MG tablet Take 81 mg by mouth. 2 every day    . calcium elemental as carbonate (TUMS ULTRA 1000) 400 MG tablet Chew 1,000 mg by mouth daily.    Marland Kitchen  Cholecalciferol 2000 UNITS CAPS Take 2,000 Units by mouth daily.    . clobetasol ointment (TEMOVATE) 0.05 % APPLY TO THE AFFECTED AREA THREE TIMES DAILY FOR FOUR WEEKS  1  . CRANBERRY  EXTRACT PO Take 1 tablet by mouth daily.    . fish oil-omega-3 fatty acids 1000 MG capsule Take 2 g by mouth. Two twice a day    . gabapentin (NEURONTIN) 300 MG capsule TAKE ONE CAPSULE BY MOUTH AT BEDTIME 30 capsule 0  . hydrochlorothiazide (HYDRODIURIL) 25 MG tablet TAKE ONE TABLET BY MOUTH DAILY 30 tablet 5  . Iron-FA-B Cmp-C-Biot-Probiotic (FUSION PLUS) CAPS   3  . loratadine (CLARITIN) 10 MG tablet Take 1 tablet (10 mg total) by mouth daily. 30 tablet 5  . potassium chloride (K-DUR,KLOR-CON) 10 MEQ tablet TAKE ONE TABLET BY MOUTH TWICE DAILY 60 tablet 5  . ranitidine (ZANTAC) 300 MG tablet Take 1 tablet (300 mg total) by mouth at bedtime. 30 tablet 5  . Red Yeast Rice 600 MG CAPS Take 600 mg by mouth 2 (two) times daily.    . sodium chloride (OCEAN) 0.65 % nasal spray Place 1 spray into the nose as needed for congestion.    . ranitidine (ZANTAC) 300 MG tablet TAKE ONE TABLET BY MOUTH AT BEDTIME 30 tablet 5   No facility-administered medications prior to visit.     PAST MEDICAL HISTORY: Past Medical History:  Diagnosis Date  . Anxiety   . Hyperlipidemia   . Hypertension   . Memory loss   . Mild cognitive impairment, so stated   . Occlusion and stenosis of carotid artery with cerebral infarction    Bilateral less than 50% carotid stenosis.  . Renal cyst   . Stroke ([place])    2010, RMCA with stent  . Toe fracture 11/2015   R toe  . Ulcer     PAST SURGICAL HISTORY: Past Surgical History:  Procedure Laterality Date  . brain stent    . COLONOSCOPY  03/10/2011   Procedure: COLONOSCOPY;  Surgeon: Rogene Houston, MD;  Location[facility];  Service: Endoscopy;  Laterality: N/A;  . DILATION AND CURETTAGE OF UTERUS     years ago  . OVARIAN CYST REMOVAL     at least 10 years ago  . TONSILLECTOMY     childhood    FAMILY HISTORY: Family History  Problem Relation Age of Onset  . Stroke Mother   . Heart failure Father   . Diabetes Brother     SOCIAL HISTORY: Social  History   Social History  . Marital status: Widowed    Spouse name: N/A  . Number of children: 2  . Years of education: College   Occupational History  .  Retired   Social History Main Topics  . Smoking status: Never Smoker  . Smokeless tobacco: Never Used  . Alcohol use No  . Drug use: No  . Sexual activity: Not on file   Other Topics Concern  . Not on file   Social History Narrative   Patient lives at home alone.   Caffeine Use: very little, rarely     PHYSICAL EXAM  Vitals:   03/01/16 1019  BP: 133/67  Pulse: (!) 53  Weight: 159 lb (72.1 kg)  Height: 5' 5.5" (1.664 m)   Body mass index is 26.06 kg/m. Generalized: Well developed, in no acute distress  Head: normocephalic and atraumatic. Oropharynx benign  Neck: Supple, no carotid bruits  Cardiac: Regular rate and rhythm, 3/6  systolic murmur radiates to clavicle,   Musculoskeletal: No deformity   Neurological examination  Mentation: Alert oriented to time, place, history taking. Follows all commands speech and language fluent Cranial nerve II-XII: . Pupils were equal round reactive to light extraocular movements were full, visual field were full on confrontational test. Facial sensation and strength were normal. hearing was intact to finger rubbing bilaterally. Uvula tongue midline. head turning and shoulder shrug and were normal and symmetric.Tongue protrusion into cheek strength was normal. Motor: The motor testing reveals 5 over 5 strength of all 4 extremities. Good symmetric motor tone is noted throughout. Left orbits right extremity. Sensory: Sensory testing is intact to soft touch on all 4 extremities. No evidence of extinction is noted.  Coordination: Cerebellar testing reveals good finger-nose-finger and heel-to-shin bilaterally.  Gait and station: Gait is normal. Tandem gait is normal. Romberg is negative.  Reflexes: Deep tendon reflexes are symmetric and normal bilaterally.   DIAGNOSTIC DATA (LABS,  IMAGING, TESTING) - I reviewed patient records, labs, notes, testing and imaging myself where available.      Component Value Date/Time   NA 142 08/11/2015 0956   K 4.1 08/11/2015 0956   CL 99 08/11/2015 0956   CO2 27 08/11/2015 0956   GLUCOSE 95 08/11/2015 0956   GLUCOSE 98 03/05/2015 1525   BUN 17 08/11/2015 0956   CREATININE 1.27 (H) 08/11/2015 0956   CREATININE 1.21 (H) 05/29/2014 0732   CALCIUM 10.3 08/11/2015 0956   PROT 7.6 08/11/2015 0956   ALBUMIN 4.8 08/11/2015 0956   AST 27 08/11/2015 0956   ALT 16 08/11/2015 0956   ALKPHOS 96 08/11/2015 0956   BILITOT 0.5 08/11/2015 0956   GFRNONAA 40 (L) 08/11/2015 0956   GFRAA 46 (L) 08/11/2015 0956   Lab Results  Component Value Date   CHOL 204 (H) 08/11/2015   HDL 61 08/11/2015   LDLCALC 116 (H) 08/11/2015   TRIG 136 08/11/2015   CHOLHDL 3.3 08/11/2015   Lab Results  Component Value Date   HGBA1C 5.9 (H) 08/11/2015    ASSESSMENT AND PLAN  82 y.o. year old female  has a past medical history of Stroke;  Hypertension; Hyperlipidemia;  Mild cognitive impairment, so stated Which is stable;  The patient is a current patient of Dr.Sethi  who is out of the office today . This note is sent to the work in doctor.  She is stable from a neurologic standpoint    PLAN Continue Aspirin at current dose for secondary stroke prevention Strict control of lipids with LDL cholesterol goal below 100. Followed by PCP Strict control of hypertension with blood pressure goal below 130/90 todays reading 133/67 Continue exercise program at [employer] after PT concluded Schedule repeat CUS at [place] discharge Dennie Bible, [place], [facility], [facility] [address], [place], [place] [address] [phone]

## 2016-03-01 NOTE — Telephone Encounter (Signed)
Patient is on schedule

## 2016-03-01 NOTE — Telephone Encounter (Signed)
Called and left message for [name] relaying Doppler is scheduled at [facility] this Thursday arrive 03-03-2016  at 11:15. Also relayed that if this was not a good date and time to please call [phone] for a good time for them. NO PA was needed for doppler. Thanks Hinton Dyer.

## 2016-03-02 ENCOUNTER — Other Ambulatory Visit: Payer: Self-pay | Admitting: Family Medicine

## 2016-03-03 ENCOUNTER — Ambulatory Visit (HOSPITAL_COMMUNITY)
Admission: RE | Admit: 2016-03-03 | Discharge: 2016-03-03 | Disposition: A | Discharge status: Home / Self Care | Payer: Medicare Other | Source: Ambulatory Visit | Attending: Nurse Practitioner | Admitting: Nurse Practitioner

## 2016-03-03 ENCOUNTER — Telehealth: Payer: Self-pay | Admitting: *Deleted

## 2016-03-03 ENCOUNTER — Ambulatory Visit (HOSPITAL_COMMUNITY): Admission: RE | Admit: 2016-03-03 | Payer: Medicare Other | Source: Ambulatory Visit

## 2016-03-03 ENCOUNTER — Ambulatory Visit (HOSPITAL_COMMUNITY): Payer: Medicare Other

## 2016-03-03 DIAGNOSIS — R2689 Other abnormalities of gait and mobility: Secondary | ICD-10-CM

## 2016-03-03 DIAGNOSIS — R2681 Unsteadiness on feet: Secondary | ICD-10-CM | POA: Diagnosis not present

## 2016-03-03 DIAGNOSIS — M6281 Muscle weakness (generalized): Secondary | ICD-10-CM

## 2016-03-03 DIAGNOSIS — I6523 Occlusion and stenosis of bilateral carotid arteries: Secondary | ICD-10-CM | POA: Insufficient documentation

## 2016-03-03 DIAGNOSIS — I10 Essential (primary) hypertension: Secondary | ICD-10-CM | POA: Diagnosis not present

## 2016-03-03 DIAGNOSIS — Z8673 Personal history of transient ischemic attack (TIA), and cerebral infarction without residual deficits: Secondary | ICD-10-CM | POA: Insufficient documentation

## 2016-03-03 NOTE — Telephone Encounter (Signed)
LMVM for pt to return call for doppler test results.

## 2016-03-03 NOTE — Therapy (Signed)
[place], [place], [address] Phone: [phone]   Fax:  [phone]  Physical Therapy Treatment  Patient Details  Name: Becky Baird MRN: [id number] Date of Birth: [birth year] Referring Provider: Arther Abbott, MD  Encounter Date: 03/03/2016      PT End of Session - 03/03/16 1011    Visit Number 14   Number of Visits 30   Date for PT Re-Evaluation 03/11/16   Authorization Type Medicare   Authorization Time Period 12/10/15-02/09/16 new 02/10/16 to 03/11/16   Authorization - Visit Number 14   Authorization - Number of Visits 20   PT Start Time 0949   PT Stop Time 1027   PT Time Calculation (min) 38 min   Activity Tolerance Patient tolerated treatment well   Behavior During Therapy [facility] for tasks assessed/performed      Past Medical History:  Diagnosis Date  . Anxiety   . Hyperlipidemia   . Hypertension   . Memory loss   . Mild cognitive impairment, so stated   . Occlusion and stenosis of carotid artery with cerebral infarction    Bilateral less than 50% carotid stenosis.  . Renal cyst   . Stroke ([place])    2010, RMCA with stent  . Toe fracture 11/2015   R toe  . Ulcer     Past Surgical History:  Procedure Laterality Date  . brain stent    . COLONOSCOPY  03/10/2011   Procedure: COLONOSCOPY;  Surgeon: Rogene Houston, MD;  [facility];  Service: Endoscopy;  Laterality: N/A;  . DILATION AND CURETTAGE OF UTERUS     years ago  . OVARIAN CYST REMOVAL     at least 10 years ago  . TONSILLECTOMY     childhood    There were no vitals filed for this visit.      Subjective Assessment - 03/03/16 0954    Subjective Pt is doing well. She continues to work on her HEP daily. Postural extension stretches are going well.    Pertinent History Osteopenia, CVA 2010 (balance, visual, and cognitive deficits per daughter)   Currently in Pain? No/denies                          [facility] Adult PT Treatment/Exercise - 03/03/16 0001      Ambulation/Gait   Gait Comments 4RT max speed gait;4RT max speed redlite/gnlite  4RT stop pivot max speed. 1.40ms     Knee/Hip Exercises: SClinical research associateBoth  edge of // bars to promote hip extension too!    Soleus Stretch Limitations Thomas Test Psoas Stretch  3x60sec bilat   Other Knee/Hip Stretches seated in chair overhead ball and extension x20  20x   Other Knee/Hip Stretches Prone on elbows: 10x3s  could not get to for time.      Knee/Hip Exercises: Standing   Heel Raises Limitations --  Moved to gait training   Gait Training Heel walking 3x385f toe walking 3x3042f   Knee/Hip Exercises: Seated   Sit to Sand 2 sets;10 reps  combo: overhead c ball, ball to floor, stand, overhead c bal                  PT Short Term Goals - 02/08/16 1139      PT SHORT TERM GOAL #1   Title After 2 weeks pt will demonstrate fluency with HEP for progress toward goals  at home.    Status Achieved     PT SHORT TERM GOAL #2   Title After 4 weeks pt will demonstrate good sequencing and use of SPC in a 2-point pattern to offset RLE loading to improve R foot heeling and improve safety in home.    Status Achieved     PT SHORT TERM GOAL #3   Title After 4 weeks pt will demonstrate good fluency with RW for safer access of limited community distances.    Status Deferred           PT Long Term Goals - 02/08/16 1139      PT LONG TERM GOAL #1   Title After 8 weeks patient will deonstrate 5/5 strength in BLE to demonstrate return to baseline level of function.    Baseline 4+ BLE   Status Partially Met     PT LONG TERM GOAL #2   Title After 8 weeks, patient will demonstrate > 8s SLS balance bilat to decrease risk of falls at home/community in the setting of osteopenia.    Baseline L: 13 sec max, R: 5 sec max (increased trunk flexion)   Status Partially Met     PT LONG TERM GOAL #3   Title After 8 weeks, patient  will show return to baseline level of function made evident in TUG <10sec, and 6MWT >1540f to demonstrate ability to safely access the community.    Baseline 6MWT: 1256 ft no AD; TUG: 10.5 sec, No AD;   Status On-going               Plan - 03/03/16 1012    Clinical Impression Statement Addressing more focal limitations to functional extension at the hip ( psoas, gastroc, thoracic spine); pt tolerating session well, especially with incorporation of dynamic balance into strengthing.    Rehab Potential Good   Clinical Impairments Affecting Rehab Potential Balance and very mild cognitive impairment after remote CVA.    PT Frequency 2x / week   PT Duration 4 weeks   PT Treatment/Interventions Gait training;Stair training;Functional mobility training;Therapeutic activities;Therapeutic exercise;Balance training;Patient/family education;Neuromuscular re-education;Passive range of motion;Manual techniques   PT Next Visit Plan Continue to work on balance activites.  Add SLS next session.    Consulted and Agree with Plan of Care Patient;Family member/caregiver   Family Member Consulted daughters      Patient will benefit from skilled therapeutic intervention in order to improve the following deficits and impairments:  Abnormal gait, Decreased cognition, Decreased activity tolerance, Decreased balance, Decreased safety awareness, Difficulty walking, Decreased strength  Visit Diagnosis: Other abnormalities of gait and mobility  Unsteadiness on feet  Muscle weakness (generalized)     Problem List Patient Active Problem List   Diagnosis Date Noted  . History of stroke 03/03/2015  . GERD (gastroesophageal reflux disease) 02/06/2015  . Aortic sclerosis ([place] 12/02/2014  . Osteopenia 01/14/2014  . Prediabetes 08/05/2013  . Hypercalcemia 04/22/2013  . Renal insufficiency 02/15/2013  . Mild cognitive impairment 01/03/2013  . Memory loss 01/03/2013  . Ulcer 10/12/2012  . Hypertension  10/12/2012  . Hyperlipidemia 10/12/2012  . CVA (cerebral infarction) 10/12/2012  . HIP PAIN, RIGHT 12/29/2009    10:35 AM, 03/03/16 AEtta Grandchild PT, DPT Physical Therapist [facility] [phone](office)     [place] [place] [address]Phone: [phone]  Fax:  [phone] Name: Becky DUMMMRN: [id number] of Birth:  10/27/1933

## 2016-03-03 NOTE — Telephone Encounter (Signed)
-----   Message from Dennie Bible, NP sent at 03/03/2016 12:53 PM EDT ----- Carotid doppler with mild stenosis, basically unchanged Please call the patient

## 2016-03-08 ENCOUNTER — Ambulatory Visit (HOSPITAL_COMMUNITY): Payer: Medicare Other | Admitting: Physical Therapy

## 2016-03-08 ENCOUNTER — Encounter: Payer: Self-pay | Admitting: *Deleted

## 2016-03-08 DIAGNOSIS — R2689 Other abnormalities of gait and mobility: Secondary | ICD-10-CM | POA: Diagnosis not present

## 2016-03-08 DIAGNOSIS — M6281 Muscle weakness (generalized): Secondary | ICD-10-CM | POA: Diagnosis not present

## 2016-03-08 DIAGNOSIS — R2681 Unsteadiness on feet: Secondary | ICD-10-CM | POA: Diagnosis not present

## 2016-03-08 NOTE — Therapy (Signed)
[place], [place], [address] Phone: [phone]   Fax:  [phone]  Physical Therapy Treatment  Patient Details  Name: Becky Baird MRN: [id number] Date of Birth: 1933-12-25 Referring Provider: Arther Abbott, MD  Encounter Date: 03/08/2016      PT End of Session - 03/08/16 1034    Visit Number 15   Number of Visits 30   Date for PT Re-Evaluation 03/11/16   Authorization Type Medicare   Authorization Time Period 12/10/15-02/09/16 new 02/10/16 to 03/11/16   Authorization - Visit Number 15   Authorization - Number of Visits 20   PT Start Time 0948   PT Stop Time 1030   PT Time Calculation (min) 42 min   Activity Tolerance Patient tolerated treatment well   Behavior During Therapy [facility] for tasks assessed/performed      Past Medical History:  Diagnosis Date  . Anxiety   . Hyperlipidemia   . Hypertension   . Memory loss   . Mild cognitive impairment, so stated   . Occlusion and stenosis of carotid artery with cerebral infarction    Bilateral less than 50% carotid stenosis.  . Renal cyst   . Stroke ([place])    2010, RMCA with stent  . Toe fracture 11/2015   R toe  . Ulcer     Past Surgical History:  Procedure Laterality Date  . brain stent    . COLONOSCOPY  03/10/2011   Procedure: COLONOSCOPY;  Surgeon: Rogene Houston, MD;  [facility];  Service: Endoscopy;  Laterality: N/A;  . DILATION AND CURETTAGE OF UTERUS     years ago  . OVARIAN CYST REMOVAL     at least 10 years ago  . TONSILLECTOMY     childhood    There were no vitals filed for this visit.      Subjective Assessment - 03/08/16 1039    Subjective Pt reports no pain or issues.  continues to do her HEP.   Currently in Pain? No/denies                         [facility] Adult PT Treatment/Exercise - 03/08/16 0001      Ambulation/Gait   Gait Comments 300 feet, Stop, go, fast, look up/down/RT/LT, turn, etc     Knee/Hip Exercises: Standing   Gait Training Heel walking 3x63f; toe walking 3x324f  Other Standing Knee Exercises UE flexion against wall 10 reps    Other Standing Knee Exercises postural 3 with red TB 15 reps each             Balance Exercises - 03/08/16 1030      Balance Exercises: Standing   SLS Eyes open;3 reps;Time  max 10" Lt, 20" Rt no UE's   Balance Beam tandem and sidestep 2RT   Tandem Gait 2 reps   Retro Gait 2 reps   Sidestepping 2 reps   Turning Both             PT Short Term Goals - 02/08/16 1139      PT SHORT TERM GOAL #1   Title After 2 weeks pt will demonstrate fluency with HEP for progress toward goals at home.    Status Achieved     PT SHORT TERM GOAL #2   Title After 4 weeks pt will demonstrate good sequencing and use of SPC in a 2-point pattern to offset RLE loading to improve R  foot heeling and improve safety in home.    Status Achieved     PT SHORT TERM GOAL #3   Title After 4 weeks pt will demonstrate good fluency with RW for safer access of limited community distances.    Status Deferred           PT Long Term Goals - 02/08/16 1139      PT LONG TERM GOAL #1   Title After 8 weeks patient will deonstrate 5/5 strength in BLE to demonstrate return to baseline level of function.    Baseline 4+ BLE   Status Partially Met     PT LONG TERM GOAL #2   Title After 8 weeks, patient will demonstrate > 8s SLS balance bilat to decrease risk of falls at home/community in the setting of osteopenia.    Baseline L: 13 sec max, R: 5 sec max (increased trunk flexion)   Status Partially Met     PT LONG TERM GOAL #3   Title After 8 weeks, patient will show return to baseline level of function made evident in TUG <10sec, and 6MWT >1510f to demonstrate ability to safely access the community.    Baseline 6MWT: 1256 ft no AD; TUG: 10.5 sec, No AD;   Status On-going               Plan - 03/08/16 1034    Clinical Impression Statement  Focused session on balance and postural activities this session.  balance beam appears to be most difficult for patient.  good job with stop/go/turn gait actvities without LOB.  Added postural theraband exercises and given instructions to add to HEP.  Pt overall progressing well and doing well at home.  Due for reassess next session and anticipate discharge.    Rehab Potential Good   Clinical Impairments Affecting Rehab Potential Balance and very mild cognitive impairment after remote CVA.    PT Frequency 2x / week   PT Duration 4 weeks   PT Treatment/Interventions Gait training;Stair training;Functional mobility training;Therapeutic activities;Therapeutic exercise;Balance training;Patient/family education;Neuromuscular re-education;Passive range of motion;Manual techniques   PT Next Visit Plan Continue to work on balance activites.  Recert due next session.   PT Home Exercise Plan updated with postural theraband exercises and given to take home   Consulted and Agree with Plan of Care Patient;Family member/caregiver   Family Member Consulted daughters      Patient will benefit from skilled therapeutic intervention in order to improve the following deficits and impairments:  Abnormal gait, Decreased cognition, Decreased activity tolerance, Decreased balance, Decreased safety awareness, Difficulty walking, Decreased strength  Visit Diagnosis: Other abnormalities of gait and mobility  Unsteadiness on feet  Muscle weakness (generalized)     Problem List Patient Active Problem List   Diagnosis Date Noted  . History of stroke 03/03/2015  . GERD (gastroesophageal reflux disease) 02/06/2015  . Aortic sclerosis ([place] 12/02/2014  . Osteopenia 01/14/2014  . Prediabetes 08/05/2013  . Hypercalcemia 04/22/2013  . Renal insufficiency 02/15/2013  . Mild cognitive impairment 01/03/2013  . Memory loss 01/03/2013  . Ulcer 10/12/2012  . Hypertension 10/12/2012  . Hyperlipidemia 10/12/2012  . CVA  (cerebral infarction) 10/12/2012  . HIP PAIN, RIGHT 12/29/2009    ATeena Irani PTA/CLT [phone] 03/08/2016, 10:40 AM  [address] [address]Phone: [phone]  Fax:  [phone] Name: CMATTHEW PAISMRN: [id number] of Birth: 502-15-1935

## 2016-03-08 NOTE — Telephone Encounter (Signed)
I tried mobile and VM not set up.  Mailed result note with results.  Pt to call back if questions.

## 2016-03-10 ENCOUNTER — Ambulatory Visit (HOSPITAL_COMMUNITY): Payer: Medicare Other | Admitting: Physical Therapy

## 2016-03-10 DIAGNOSIS — R2689 Other abnormalities of gait and mobility: Secondary | ICD-10-CM

## 2016-03-10 DIAGNOSIS — R2681 Unsteadiness on feet: Secondary | ICD-10-CM

## 2016-03-10 DIAGNOSIS — M6281 Muscle weakness (generalized): Secondary | ICD-10-CM

## 2016-03-10 NOTE — Therapy (Signed)
[place], [place], [address] Phone: [phone]   Fax:  [phone]  Physical Therapy Treatment  Patient Details  Name: Becky Baird MRN: [id number] Date of Birth: Nov 23, 1933 Referring Provider: Arther Abbott   Encounter Date: 03/10/2016      PT End of Session - 03/10/16 1028    Visit Number 16   Number of Visits 16   Date for PT Re-Evaluation 03/11/16   Authorization Type Medicare   Authorization Time Period 12/10/15-02/09/16 new 02/10/16 to 03/11/16   Authorization - Visit Number 16   Authorization - Number of Visits 16   PT Start Time 0945   PT Stop Time 1030   PT Time Calculation (min) 45 min   Equipment Utilized During Treatment Gait belt   Activity Tolerance Patient tolerated treatment well   Behavior During Therapy [facility] for tasks assessed/performed      Past Medical History:  Diagnosis Date  . Anxiety   . Hyperlipidemia   . Hypertension   . Memory loss   . Mild cognitive impairment, so stated   . Occlusion and stenosis of carotid artery with cerebral infarction    Bilateral less than 50% carotid stenosis.  . Renal cyst   . Stroke ([place])    2010, RMCA with stent  . Toe fracture 11/2015   R toe  . Ulcer     Past Surgical History:  Procedure Laterality Date  . brain stent    . COLONOSCOPY  03/10/2011   Procedure: COLONOSCOPY;  Surgeon: Rogene Houston, MD;  [facility];  Service: Endoscopy;  Laterality: N/A;  . DILATION AND CURETTAGE OF UTERUS     years ago  . OVARIAN CYST REMOVAL     at least 10 years ago  . TONSILLECTOMY     childhood    There were no vitals filed for this visit.      Subjective Assessment - 03/10/16 0948    Subjective Pt states that she is doing everything that she was doing before.  She is doing her home exercises.    Pertinent History CVA 2010 affecting her balance and eyesight.     How long can you stand comfortably? no limitations    How  long can you walk comfortably? able to walk without an assistive device able to walk for 30 minutes    Currently in Pain? No/denies            [facility] PT Assessment - 03/10/16 0001      Assessment   Medical Diagnosis R foot met fractures   Referring Provider Arther Abbott    Onset Date/Surgical Date 10/10/15   Prior Therapy None     Precautions   Precautions Other (comment)  Asked to use a SPC   Required Braces or Orthoses --  Right Aircast boot, excepts for bathing anf sleeping x6w     Restrictions   Weight Bearing Restrictions No   RLE Weight Bearing --  none listed in MD note; pt/family report verbal restrictions     Prior Function   Level of Independence Independent   Vocation Retired     Associate Professor   Overall Cognitive Status History of cognitive impairments - at baseline  self reported impairment from CVA 2010     Sensation   Light Touch Appears Intact     Functional Tests   Functional tests Single leg stance;Sit to Stand     Single Leg Stance   Comments 11  bilaterally after 5 attempts.      Sit to Stand   Comments 11.54 x 5 reps      AROM   Overall AROM  Within functional limits for tasks performed   Right/Left Ankle Right;Left     Strength   Right/Left Ankle --  WFL for both ankles      Ambulation/Gait   Ambulation/Gait    Ambulation/Gait Assistance    Ambulation Distance (Feet)    Assistive device    Gait Pattern    Gait Comments      6 Minute Walk- Baseline   6 Minute Walk- Baseline --  6' walk test: 1200 ft no assistive  device      Timed Up and Go Test   TUG Normal TUG   Normal TUG (seconds) 10                             PT Education - 03/10/16 1028    Education provided Yes   Education Details The importance of walking; the importance of proper posture.    Person(s) Educated Patient   Methods Explanation;Handout   Comprehension Verbalized understanding          PT Short Term Goals - 03/10/16 1015       PT SHORT TERM GOAL #1   Title After 2 weeks pt will demonstrate fluency with HEP for progress toward goals at home.    Status Achieved     PT SHORT TERM GOAL #2   Title After 4 weeks pt will demonstrate good sequencing and use of SPC in a 2-point pattern to offset RLE loading to improve R foot heeling and improve safety in home.    Status Achieved     PT SHORT TERM GOAL #3   Title After 4 weeks pt will demonstrate good fluency with RW for safer access of limited community distances.    Status Achieved           PT Long Term Goals - 03/10/16 1016      PT LONG TERM GOAL #1   Title After 8 weeks patient will deonstrate 5/5 strength in BLE to demonstrate return to baseline level of function.    Baseline 4+ BLE   Status Achieved     PT LONG TERM GOAL #2   Title After 8 weeks, patient will demonstrate > 8s SLS balance bilat to decrease risk of falls at home/community in the setting of osteopenia.    Baseline L: 13 sec max, R: 5 sec max (increased trunk flexion)   Status Achieved     PT LONG TERM GOAL #3   Title After 8 weeks, patient will show return to baseline level of function made evident in TUG <10sec, and 6MWT >1526f to demonstrate ability to safely access the community.    Baseline 6MWT: 1256 ft no AD; TUG: 10. sec, No AD;   Status Partially Met               Plan - 03/10/16 1029    Clinical Impression Statement Pt reassessed with strenght and ROM wnl.  Pt has no functional limitations at home.  Pt was educated on the importance of walking 5x a week, completing balance exercises everyday for the rest of her life and the importance proper posture has in balance and back/hip pain.  Pt agrees with discharge.    Rehab Potential Good   Clinical Impairments Affecting Rehab Potential Balance and very  mild cognitive impairment after remote CVA.    PT Frequency 2x / week   PT Duration 4 weeks   PT Treatment/Interventions Gait training;Stair training;Functional mobility  training;Therapeutic activities;Therapeutic exercise;Balance training;Patient/family education;Neuromuscular re-education;Passive range of motion;Manual techniques   PT Next Visit Plan Discharge pt to silver sneakers; pt is going three times a week already    Consulted and Agree with Plan of Care Patient;Family member/caregiver   Family Member Consulted daughters      Patient will benefit from skilled therapeutic intervention in order to improve the following deficits and impairments:  Abnormal gait, Decreased cognition, Decreased activity tolerance, Decreased balance, Decreased safety awareness, Difficulty walking, Decreased strength  Visit Diagnosis: Other abnormalities of gait and mobility  Unsteadiness on feet  Muscle weakness (generalized)       G-Codes - 03/14/16 1032    Functional Limitation Mobility: Walking and moving around   Mobility: Walking and Moving Around Goal Status [phone]) At least 1 percent but less than 20 percent impaired, limited or restricted   Mobility: Walking and Moving Around Discharge Status [phone]) At least 1 percent but less than 20 percent impaired, limited or restricted      Problem List Patient Active Problem List   Diagnosis Date Noted  . History of stroke 03/03/2015  . GERD (gastroesophageal reflux disease) 02/06/2015  . Aortic sclerosis ([place]) 12/02/2014  . Osteopenia 01/14/2014  . Prediabetes 08/05/2013  . Hypercalcemia 04/22/2013  . Renal insufficiency 02/15/2013  . Mild cognitive impairment 01/03/2013  . Memory loss 01/03/2013  . Ulcer 10/12/2012  . Hypertension 10/12/2012  . Hyperlipidemia 10/12/2012  . CVA (cerebral infarction) 10/12/2012  . HIP PAIN, RIGHT 12/29/2009    Rayetta Humphrey, PT CLT [phone] 03/14/16, 10:33 AM  [place], [place], [address] Phone: [phone]   Fax:  [phone]  Name: VENICE MARCUCCI MRN: [id number] Date of Birth:  [birth year]   PHYSICAL THERAPY DISCHARGE SUMMARY  Visits from Start of Care: 16  Current functional level related to goals / functional outcomes: See above   Remaining deficits: none   Education / Equipment: Ozella Almond above Plan: Patient agrees to discharge.  Patient goals were met. Patient is being discharged due to meeting the stated rehab goals.  ?????     Rayetta Humphrey, [place] CLT [phone]

## 2016-03-15 ENCOUNTER — Ambulatory Visit: Payer: Medicare Other | Admitting: Family Medicine

## 2016-03-15 ENCOUNTER — Encounter (HOSPITAL_COMMUNITY): Payer: Medicare Other

## 2016-03-17 ENCOUNTER — Encounter (HOSPITAL_COMMUNITY): Payer: Medicare Other

## 2016-03-21 ENCOUNTER — Other Ambulatory Visit: Payer: Self-pay | Admitting: Family Medicine

## 2016-03-21 DIAGNOSIS — Z1231 Encounter for screening mammogram for malignant neoplasm of breast: Secondary | ICD-10-CM

## 2016-03-22 ENCOUNTER — Encounter (HOSPITAL_COMMUNITY): Payer: Medicare Other

## 2016-03-22 ENCOUNTER — Telehealth: Payer: Self-pay | Admitting: Family Medicine

## 2016-03-22 DIAGNOSIS — E785 Hyperlipidemia, unspecified: Secondary | ICD-10-CM

## 2016-03-22 DIAGNOSIS — R7303 Prediabetes: Secondary | ICD-10-CM

## 2016-03-22 DIAGNOSIS — Z79899 Other long term (current) drug therapy: Secondary | ICD-10-CM

## 2016-03-22 NOTE — Telephone Encounter (Signed)
A1c, lipid, liver, metabolic 7

## 2016-03-22 NOTE — Telephone Encounter (Signed)
Notified patient bloodwork has been ordered.  

## 2016-03-22 NOTE — Telephone Encounter (Signed)
Patient wants to know if she needs BW for appointment April 19, 2016?

## 2016-03-25 NOTE — Progress Notes (Signed)
I reviewed note and agree with plan.   VIKRAM R. PENUMALLI, MD  Certified in Neurology, Neurophysiology and Neuroimaging  [facility] [address], [address] [place], [place] [address] [phone]   

## 2016-03-29 ENCOUNTER — Other Ambulatory Visit: Payer: Self-pay | Admitting: Family Medicine

## 2016-03-29 NOTE — Telephone Encounter (Signed)
May have refill on each along with 3 additional refills

## 2016-03-31 ENCOUNTER — Ambulatory Visit (HOSPITAL_COMMUNITY)
Admission: RE | Admit: 2016-03-31 | Discharge: 2016-03-31 | Disposition: A | Discharge status: Home / Self Care | Payer: Medicare Other | Source: Ambulatory Visit | Attending: Family Medicine | Admitting: Family Medicine

## 2016-03-31 DIAGNOSIS — Z1231 Encounter for screening mammogram for malignant neoplasm of breast: Secondary | ICD-10-CM | POA: Diagnosis not present

## 2016-03-31 DIAGNOSIS — R7303 Prediabetes: Secondary | ICD-10-CM | POA: Diagnosis not present

## 2016-03-31 DIAGNOSIS — Z79899 Other long term (current) drug therapy: Secondary | ICD-10-CM | POA: Diagnosis not present

## 2016-03-31 DIAGNOSIS — E785 Hyperlipidemia, unspecified: Secondary | ICD-10-CM | POA: Diagnosis not present

## 2016-04-01 LAB — BASIC METABOLIC PANEL
BUN/Creatinine Ratio: 13 (ref 12–28)
BUN: 16 mg/dL (ref 8–27)
CALCIUM: 10.4 mg/dL — AB (ref 8.7–10.3)
CHLORIDE: 98 mmol/L (ref 96–106)
CO2: 27 mmol/L (ref 18–29)
Creatinine, Ser: 1.25 mg/dL — ABNORMAL HIGH (ref 0.57–1.00)
GFR calc Af Amer: 47 mL/min/{1.73_m2} — ABNORMAL LOW (ref >59)
GFR calc non Af Amer: 41 mL/min/{1.73_m2} — ABNORMAL LOW (ref >59)
GLUCOSE: 93 mg/dL (ref 65–99)
POTASSIUM: 4.1 mmol/L (ref 3.5–5.2)
Sodium: 142 mmol/L (ref 134–144)

## 2016-04-01 LAB — LIPID PANEL
CHOL/HDL RATIO: 2.9 ratio (ref 0.0–4.4)
Cholesterol, Total: 172 mg/dL (ref 100–199)
HDL: 60 mg/dL (ref >39)
LDL CALC: 92 mg/dL (ref 0–99)
TRIGLYCERIDES: 101 mg/dL (ref 0–149)
VLDL CHOLESTEROL CAL: 20 mg/dL (ref 5–40)

## 2016-04-01 LAB — HEPATIC FUNCTION PANEL
ALT: 16 IU/L (ref 0–32)
AST: 21 IU/L (ref 0–40)
Albumin: 4.8 g/dL — ABNORMAL HIGH (ref 3.5–4.7)
Alkaline Phosphatase: 100 IU/L (ref 39–117)
Bilirubin Total: 0.6 mg/dL (ref 0.0–1.2)
Bilirubin, Direct: 0.17 mg/dL (ref 0.00–0.40)
Total Protein: 7.5 g/dL (ref 6.0–8.5)

## 2016-04-01 LAB — HEMOGLOBIN A1C
Est. average glucose Bld gHb Est-mCnc: 117 mg/dL
HEMOGLOBIN A1C: 5.7 % — AB (ref 4.8–5.6)

## 2016-04-02 DIAGNOSIS — Z23 Encounter for immunization: Secondary | ICD-10-CM | POA: Diagnosis not present

## 2016-04-03 ENCOUNTER — Encounter: Payer: Self-pay | Admitting: Family Medicine

## 2016-04-04 ENCOUNTER — Other Ambulatory Visit: Payer: Self-pay | Admitting: Family Medicine

## 2016-04-06 ENCOUNTER — Telehealth: Payer: Self-pay | Admitting: Family Medicine

## 2016-04-06 NOTE — Telephone Encounter (Signed)
Left message on voicemail return call  

## 2016-04-06 NOTE — Telephone Encounter (Signed)
Pt's daughter called stating that she would like to speak with someone to give the dr a heads up before the pt is seen in a couple weeks. Pt's daughter if worried that the pt wont tell the dr everything that is going on with her. Please advise.

## 2016-04-08 NOTE — Telephone Encounter (Signed)
Left message to return call 

## 2016-04-13 ENCOUNTER — Ambulatory Visit (INDEPENDENT_AMBULATORY_CARE_PROVIDER_SITE_OTHER): Payer: Medicare Other | Admitting: Family Medicine

## 2016-04-13 ENCOUNTER — Encounter: Payer: Self-pay | Admitting: Family Medicine

## 2016-04-13 VITALS — BP 110/80 | Ht 67.5 in | Wt 158.1 lb

## 2016-04-13 DIAGNOSIS — F411 Generalized anxiety disorder: Secondary | ICD-10-CM | POA: Diagnosis not present

## 2016-04-13 DIAGNOSIS — M545 Low back pain, unspecified: Secondary | ICD-10-CM

## 2016-04-13 DIAGNOSIS — R7303 Prediabetes: Secondary | ICD-10-CM

## 2016-04-13 DIAGNOSIS — K219 Gastro-esophageal reflux disease without esophagitis: Secondary | ICD-10-CM | POA: Diagnosis not present

## 2016-04-13 DIAGNOSIS — I10 Essential (primary) hypertension: Secondary | ICD-10-CM

## 2016-04-13 DIAGNOSIS — R5383 Other fatigue: Secondary | ICD-10-CM

## 2016-04-13 DIAGNOSIS — E785 Hyperlipidemia, unspecified: Secondary | ICD-10-CM | POA: Diagnosis not present

## 2016-04-13 DIAGNOSIS — R1013 Epigastric pain: Secondary | ICD-10-CM | POA: Diagnosis not present

## 2016-04-13 MED ORDER — PANTOPRAZOLE SODIUM 40 MG PO TBEC: 40.0000 mg | DELAYED_RELEASE_TABLET | Freq: Every day | ORAL | 5 refills | Status: DC

## 2016-04-13 NOTE — Progress Notes (Signed)
Subjective:    Patient ID: Becky Baird, female    DOB: March 31, 1934, 82 y.o.   MRN: [id number]  Hypertension  This is a chronic problem. The current episode started more than 1 year ago. The problem has been gradually improving since onset. Pertinent negatives include no headaches. There are no associated agents to hypertension. There are no known risk factors for coronary artery disease. Treatments tried: hctz. The current treatment provides moderate improvement. There are no compliance problems.    Patient states that she has some right foot pain.She had previous surgery on the foot head where a boot.  Questions about colonoscopy. She wonders whether or not she should have a colonoscopy she is due for this she is 82 years old but she is a youthful older person I believe doing a colonoscopy would be in her best interest because previous polyp showed precancerous issues  Patient has some anxiety issues, she denies being depressed. States she gets uptight about a lot of things and anxious about a lot of things.  Hyperlipidemia and the importance of diet exercise discuss we discussed her lab work previous lab work and her medication  No sign of any type of strokes going on. Patient deconditioned. Having a fair amount of fatigue.   abdominal pain and back pain also. Patient denies vomiting relates a lot of burning in the epigastrium. Denies sweats chills or bloody stools. The back discomfort is low back bothers her more when she standing for any length of time she is able to do some walking but she gives out easily she denies radiation down the legs does not wake her up at night   Review of Systems  Constitutional: Positive for fatigue. Negative for activity change, appetite change and fever.  Gastrointestinal: Negative for abdominal pain.  Musculoskeletal: Positive for arthralgias and back pain.  Neurological: Negative for headaches.  Psychiatric/Behavioral: Negative for behavioral  problems.       Objective:   Physical Exam Lungs are clear no crackles heart regular pulse normal BP is good abdomen soft extremities no edema low back subjective discomfort negative straight leg raise moderately tight hamstrings extremities no edema slight redness of the right MTP       Assessment & Plan:  1. Midline low back pain without sciatica I believe this is more functional back pain possibly related to strain in her lower back hold off on x-rays currently if not better within 4-6 weeks we will be doing some x-rays  2. Other fatigue I believe her fatigue is more related to her age and deconditioning from having recent surgery on her feet but hopefully lab work will help rule out possibility of anemia as well as thyroid condition she relates a lot of fatigue and dry skin - TSH - CBC with Differential/Platelet  3. Abdominal pain, epigastric Intermittent epigastric pain more than likely reflux issues switch medications from Zantac. Start Protonix if not doing dramatically better in 4-6 weeks referral to for EGD  4. Anxiety state Use Xanax when necessary cautioned drowsiness we discussed the importance of self help and opened up the door for counseling if patient desires plus also open up the door for possibility of low-dose Celexa await how the patient is doing in 4-6 weeks  5. Essential hypertension Blood pressure good control continue current measures.  6. Gastroesophageal reflux disease without esophagitis Reflux see discussion above  7. Hyperlipidemia Current lab work previous lab work reviewed continue current measures watch diet  8. Hypercalcemia Stop calcium  supplementation monitor calcium  9. Prediabetes Prediabetes under good control continue current measures follow-up if problems  Foot pain possibly due to hammertoe I do not recommend any type of surgery or gout I recommend better fitting shoes

## 2016-04-14 LAB — CBC WITH DIFFERENTIAL/PLATELET
BASOS ABS: 0.1 10*3/uL (ref 0.0–0.2)
Basos: 1 %
EOS (ABSOLUTE): 0.5 10*3/uL — AB (ref 0.0–0.4)
Eos: 7 %
Hematocrit: 41.1 % (ref 34.0–46.6)
Hemoglobin: 13.7 g/dL (ref 11.1–15.9)
IMMATURE GRANULOCYTES: 0 %
Immature Grans (Abs): 0 10*3/uL (ref 0.0–0.1)
Lymphocytes Absolute: 2.4 10*3/uL (ref 0.7–3.1)
Lymphs: 31 %
MCH: 31.4 pg (ref 26.6–33.0)
MCHC: 33.3 g/dL (ref 31.5–35.7)
MCV: 94 fL (ref 79–97)
MONOS ABS: 0.6 10*3/uL (ref 0.1–0.9)
Monocytes: 8 %
NEUTROS PCT: 53 %
Neutrophils Absolute: 4 10*3/uL (ref 1.4–7.0)
PLATELETS: 274 10*3/uL (ref 150–379)
RBC: 4.37 x10E6/uL (ref 3.77–5.28)
RDW: 13 % (ref 12.3–15.4)
WBC: 7.6 10*3/uL (ref 3.4–10.8)

## 2016-04-14 LAB — TSH: TSH: 2.27 u[IU]/mL (ref 0.450–4.500)

## 2016-04-19 ENCOUNTER — Ambulatory Visit: Payer: Medicare Other | Admitting: Family Medicine

## 2016-04-29 ENCOUNTER — Telehealth: Payer: Self-pay | Admitting: Family Medicine

## 2016-04-29 NOTE — Telephone Encounter (Signed)
Patient wants to know if it would be okay if she could take the Tdap shot because she has a new born baby in her family that she is going to be around.

## 2016-04-29 NOTE — Telephone Encounter (Signed)
Discussed with pt she can get tdapbut  Insurance will not cover. The cost to get here is $72 per [place]. Pt states she will call pharm to see how much it will cost there and call back for nurse visit if she decides she wants to get it here.

## 2016-04-29 NOTE — Telephone Encounter (Signed)
It would be fine for her to do so

## 2016-04-29 NOTE — Telephone Encounter (Signed)
Had regular tetanus vaccine in 2015

## 2016-05-03 DIAGNOSIS — I63331 Cerebral infarction due to thrombosis of right posterior cerebral artery: Secondary | ICD-10-CM | POA: Diagnosis not present

## 2016-05-03 DIAGNOSIS — H10413 Chronic giant papillary conjunctivitis, bilateral: Secondary | ICD-10-CM | POA: Diagnosis not present

## 2016-05-03 DIAGNOSIS — H02831 Dermatochalasis of right upper eyelid: Secondary | ICD-10-CM | POA: Diagnosis not present

## 2016-05-03 DIAGNOSIS — Z961 Presence of intraocular lens: Secondary | ICD-10-CM | POA: Diagnosis not present

## 2016-05-03 DIAGNOSIS — H02834 Dermatochalasis of left upper eyelid: Secondary | ICD-10-CM | POA: Diagnosis not present

## 2016-05-04 ENCOUNTER — Telehealth (INDEPENDENT_AMBULATORY_CARE_PROVIDER_SITE_OTHER): Payer: Self-pay | Admitting: *Deleted

## 2016-05-04 NOTE — Telephone Encounter (Signed)
Patient stopped by office inquiring if you will still do TCS due to her age -- someone has told her they don't do colonoscopy on people her age -- appears to be in good health -- on recall for 5 yr TCS -- please advise

## 2016-05-09 ENCOUNTER — Other Ambulatory Visit: Payer: Self-pay | Admitting: Family Medicine

## 2016-05-10 ENCOUNTER — Other Ambulatory Visit (INDEPENDENT_AMBULATORY_CARE_PROVIDER_SITE_OTHER): Payer: Self-pay | Admitting: *Deleted

## 2016-05-10 DIAGNOSIS — Z86010 Personal history of colonic polyps: Secondary | ICD-10-CM | POA: Insufficient documentation

## 2016-05-10 NOTE — Telephone Encounter (Signed)
Will be fine with conscious sedation.  

## 2016-05-10 NOTE — Telephone Encounter (Signed)
Talked with patient. Will schedule her for colonoscopy since she's had multiple adenomas in the past. She is on aspirin (for history of CVA) which will be continued.

## 2016-05-10 NOTE — Telephone Encounter (Signed)
TCS sch'd 08/24/16 at 37, patient's daughter [name] is aware

## 2016-05-10 NOTE — Telephone Encounter (Signed)
Patient is on Gabapentin 300 mg at bedtime, will she need with propofol? Please advise. thanks

## 2016-05-12 ENCOUNTER — Observation Stay (HOSPITAL_COMMUNITY)
Admission: EM | Admit: 2016-05-12 | Discharge: 2016-05-13 | Disposition: A | Discharge status: Home / Self Care | Payer: Medicare Other | Attending: Internal Medicine | Admitting: Internal Medicine

## 2016-05-12 ENCOUNTER — Encounter (HOSPITAL_COMMUNITY): Payer: Self-pay | Admitting: Emergency Medicine

## 2016-05-12 ENCOUNTER — Emergency Department (HOSPITAL_COMMUNITY): Payer: Medicare Other

## 2016-05-12 ENCOUNTER — Telehealth: Payer: Self-pay | Admitting: Family Medicine

## 2016-05-12 DIAGNOSIS — N390 Urinary tract infection, site not specified: Secondary | ICD-10-CM | POA: Insufficient documentation

## 2016-05-12 DIAGNOSIS — R030 Elevated blood-pressure reading, without diagnosis of hypertension: Secondary | ICD-10-CM | POA: Diagnosis not present

## 2016-05-12 DIAGNOSIS — G459 Transient cerebral ischemic attack, unspecified: Principal | ICD-10-CM | POA: Insufficient documentation

## 2016-05-12 DIAGNOSIS — I639 Cerebral infarction, unspecified: Secondary | ICD-10-CM | POA: Diagnosis present

## 2016-05-12 DIAGNOSIS — Z7982 Long term (current) use of aspirin: Secondary | ICD-10-CM | POA: Insufficient documentation

## 2016-05-12 DIAGNOSIS — R4182 Altered mental status, unspecified: Secondary | ICD-10-CM | POA: Diagnosis not present

## 2016-05-12 DIAGNOSIS — E785 Hyperlipidemia, unspecified: Secondary | ICD-10-CM | POA: Diagnosis present

## 2016-05-12 DIAGNOSIS — Z79899 Other long term (current) drug therapy: Secondary | ICD-10-CM | POA: Diagnosis not present

## 2016-05-12 DIAGNOSIS — I10 Essential (primary) hypertension: Secondary | ICD-10-CM | POA: Diagnosis not present

## 2016-05-12 DIAGNOSIS — N289 Disorder of kidney and ureter, unspecified: Secondary | ICD-10-CM

## 2016-05-12 LAB — RAPID URINE DRUG SCREEN, HOSP PERFORMED
AMPHETAMINES: NOT DETECTED
BENZODIAZEPINES: POSITIVE — AB
Barbiturates: NOT DETECTED
Cocaine: NOT DETECTED
OPIATES: NOT DETECTED
TETRAHYDROCANNABINOL: NOT DETECTED

## 2016-05-12 LAB — COMPREHENSIVE METABOLIC PANEL
ALBUMIN: 4 g/dL (ref 3.5–5.0)
ALK PHOS: 78 U/L (ref 38–126)
ALT: 18 U/L (ref 14–54)
ANION GAP: 6 (ref 5–15)
AST: 23 U/L (ref 15–41)
BUN: 18 mg/dL (ref 6–20)
CALCIUM: 9.5 mg/dL (ref 8.9–10.3)
CO2: 30 mmol/L (ref 22–32)
CREATININE: 1.23 mg/dL — AB (ref 0.44–1.00)
Chloride: 100 mmol/L — ABNORMAL LOW (ref 101–111)
GFR calc Af Amer: 47 mL/min — ABNORMAL LOW (ref >60)
GFR calc non Af Amer: 40 mL/min — ABNORMAL LOW (ref >60)
GLUCOSE: 100 mg/dL — AB (ref 65–99)
Potassium: 4.2 mmol/L (ref 3.5–5.1)
SODIUM: 136 mmol/L (ref 135–145)
Total Bilirubin: 0.5 mg/dL (ref 0.3–1.2)
Total Protein: 7.4 g/dL (ref 6.5–8.1)

## 2016-05-12 LAB — DIFFERENTIAL
Basophils Absolute: 0 10*3/uL (ref 0.0–0.1)
Basophils Relative: 1 %
Eosinophils Absolute: 0.4 10*3/uL (ref 0.0–0.7)
Eosinophils Relative: 7 %
LYMPHS PCT: 35 %
Lymphs Abs: 2.2 10*3/uL (ref 0.7–4.0)
Monocytes Absolute: 0.6 10*3/uL (ref 0.1–1.0)
Monocytes Relative: 9 %
NEUTROS ABS: 3 10*3/uL (ref 1.7–7.7)
NEUTROS PCT: 48 %

## 2016-05-12 LAB — I-STAT CHEM 8, ED
BUN: 17 mg/dL (ref 6–20)
CHLORIDE: 100 mmol/L — AB (ref 101–111)
Calcium, Ion: 1.23 mmol/L (ref 1.15–1.40)
Creatinine, Ser: 1.4 mg/dL — ABNORMAL HIGH (ref 0.44–1.00)
Glucose, Bld: 92 mg/dL (ref 65–99)
HEMATOCRIT: 40 % (ref 36.0–46.0)
Hemoglobin: 13.6 g/dL (ref 12.0–15.0)
Potassium: 4.1 mmol/L (ref 3.5–5.1)
SODIUM: 139 mmol/L (ref 135–145)
TCO2: 28 mmol/L (ref 0–100)

## 2016-05-12 LAB — CBC
HCT: 41.2 % (ref 36.0–46.0)
Hemoglobin: 13.4 g/dL (ref 12.0–15.0)
MCH: 30.8 pg (ref 26.0–34.0)
MCHC: 32.5 g/dL (ref 30.0–36.0)
MCV: 94.7 fL (ref 78.0–100.0)
PLATELETS: 224 10*3/uL (ref 150–400)
RBC: 4.35 MIL/uL (ref 3.87–5.11)
RDW: 12.5 % (ref 11.5–15.5)
WBC: 6.1 10*3/uL (ref 4.0–10.5)

## 2016-05-12 LAB — URINALYSIS, ROUTINE W REFLEX MICROSCOPIC
BILIRUBIN URINE: NEGATIVE
GLUCOSE, UA: NEGATIVE mg/dL
HGB URINE DIPSTICK: NEGATIVE
KETONES UR: NEGATIVE mg/dL
NITRITE: NEGATIVE
PH: 7 (ref 5.0–8.0)
Protein, ur: NEGATIVE mg/dL
Specific Gravity, Urine: <1.005 — ABNORMAL LOW (ref 1.005–1.030)

## 2016-05-12 LAB — I-STAT TROPONIN, ED: Troponin i, poc: 0.01 ng/mL (ref 0.00–0.08)

## 2016-05-12 LAB — URINE MICROSCOPIC-ADD ON: RBC / HPF: NONE SEEN RBC/hpf (ref 0–5)

## 2016-05-12 LAB — TSH: TSH: 3.053 u[IU]/mL (ref 0.350–4.500)

## 2016-05-12 LAB — PROTIME-INR
INR: 0.97
INR: 0.97
PROTHROMBIN TIME: 12.9 s (ref 11.4–15.2)
Prothrombin Time: 12.9 seconds (ref 11.4–15.2)

## 2016-05-12 LAB — APTT: aPTT: 29 seconds (ref 24–36)

## 2016-05-12 LAB — ETHANOL: Alcohol, Ethyl (B): 6 mg/dL — ABNORMAL HIGH (ref <5)

## 2016-05-12 MED ORDER — GABAPENTIN 300 MG PO CAPS
300.0000 mg | ORAL_CAPSULE | Freq: Every day | ORAL | Status: DC
  Administered 2016-05-12: 300 mg via ORAL
  Filled 2016-05-12: qty 1

## 2016-05-12 MED ORDER — ALBUTEROL SULFATE (2.5 MG/3ML) 0.083% IN NEBU: 2.5000 mg | INHALATION_SOLUTION | RESPIRATORY_TRACT | Status: DC | PRN

## 2016-05-12 MED ORDER — ENOXAPARIN SODIUM 40 MG/0.4ML ~~LOC~~ SOLN
40.0000 mg | SUBCUTANEOUS | Status: DC
  Administered 2016-05-12: 40 mg via SUBCUTANEOUS
  Filled 2016-05-12: qty 0.4

## 2016-05-12 MED ORDER — ACETAMINOPHEN 325 MG PO TABS: 650.0000 mg | ORAL_TABLET | Freq: Four times a day (QID) | ORAL | Status: DC | PRN

## 2016-05-12 MED ORDER — ASPIRIN EC 325 MG PO TBEC
325.0000 mg | DELAYED_RELEASE_TABLET | Freq: Once | ORAL | Status: AC
  Administered 2016-05-12: 325 mg via ORAL
  Filled 2016-05-12 (×2): qty 1

## 2016-05-12 MED ORDER — DEXTROSE 5 % IV SOLN
1.0000 g | INTRAVENOUS | Status: DC
  Administered 2016-05-12: 1 g via INTRAVENOUS
  Filled 2016-05-12 (×3): qty 10

## 2016-05-12 MED ORDER — PANTOPRAZOLE SODIUM 40 MG PO TBEC
40.0000 mg | DELAYED_RELEASE_TABLET | Freq: Every day | ORAL | Status: DC
  Administered 2016-05-12 – 2016-05-13 (×2): 40 mg via ORAL
  Filled 2016-05-12 (×2): qty 1

## 2016-05-12 MED ORDER — SENNOSIDES-DOCUSATE SODIUM 8.6-50 MG PO TABS: 1.0000 | ORAL_TABLET | Freq: Every evening | ORAL | Status: DC | PRN

## 2016-05-12 MED ORDER — LORAZEPAM 2 MG/ML IJ SOLN: 1.0000 mg | Freq: Once | INTRAMUSCULAR | Status: DC | PRN

## 2016-05-12 MED ORDER — ASPIRIN 81 MG PO CHEW: 81.0000 mg | CHEWABLE_TABLET | Freq: Every day | ORAL | Status: DC

## 2016-05-12 MED ORDER — ONDANSETRON HCL 4 MG/2ML IJ SOLN
4.0000 mg | Freq: Four times a day (QID) | INTRAMUSCULAR | Status: DC | PRN
Start: 2016-05-12 — End: 2016-05-13

## 2016-05-12 MED ORDER — ALPRAZOLAM 0.5 MG PO TABS
0.5000 mg | ORAL_TABLET | Freq: Every evening | ORAL | Status: DC | PRN
  Administered 2016-05-12: 0.5 mg via ORAL
  Filled 2016-05-12: qty 1

## 2016-05-12 MED ORDER — VITAMIN D 1000 UNITS PO TABS
2000.0000 [IU] | ORAL_TABLET | Freq: Every day | ORAL | Status: DC
Start: 2016-05-12 — End: 2016-05-13
  Administered 2016-05-12 – 2016-05-13 (×2): 2000 [IU] via ORAL
  Filled 2016-05-12 (×5): qty 2

## 2016-05-12 MED ORDER — RED YEAST RICE 600 MG PO CAPS: 600.0000 mg | ORAL_CAPSULE | Freq: Two times a day (BID) | ORAL | Status: DC

## 2016-05-12 MED ORDER — ASPIRIN 81 MG PO CHEW
162.0000 mg | CHEWABLE_TABLET | Freq: Every day | ORAL | Status: DC
  Administered 2016-05-13: 162 mg via ORAL
  Filled 2016-05-12 (×4): qty 2

## 2016-05-12 MED ORDER — SODIUM CHLORIDE 0.9 % IV SOLN
INTRAVENOUS | Status: DC
  Administered 2016-05-12: 22:00:00 via INTRAVENOUS

## 2016-05-12 MED ORDER — STROKE: EARLY STAGES OF RECOVERY BOOK
Freq: Once | Status: AC
  Administered 2016-05-12: 20:00:00
  Filled 2016-05-12: qty 1

## 2016-05-12 MED ORDER — POLYVINYL ALCOHOL 1.4 % OP SOLN: 1.0000 [drp] | Freq: Every day | OPHTHALMIC | Status: DC | PRN

## 2016-05-12 MED ORDER — POTASSIUM CHLORIDE CRYS ER 10 MEQ PO TBCR
10.0000 meq | EXTENDED_RELEASE_TABLET | Freq: Two times a day (BID) | ORAL | Status: DC
  Administered 2016-05-12 – 2016-05-13 (×2): 10 meq via ORAL
  Filled 2016-05-12 (×2): qty 1

## 2016-05-12 MED ORDER — OMEGA-3-ACID ETHYL ESTERS 1 G PO CAPS
2.0000 g | ORAL_CAPSULE | Freq: Two times a day (BID) | ORAL | Status: DC
Start: 2016-05-12 — End: 2016-05-13
  Administered 2016-05-12 – 2016-05-13 (×2): 2 g via ORAL
  Filled 2016-05-12 (×9): qty 2

## 2016-05-12 MED ORDER — HYDRALAZINE HCL 20 MG/ML IJ SOLN: 10.0000 mg | Freq: Four times a day (QID) | INTRAMUSCULAR | Status: DC | PRN

## 2016-05-12 NOTE — Telephone Encounter (Signed)
Pt is currently being admitted to [facility] for UTI with altered mental status  Is supposed to have eyelid surgery 05/19/16 and to stop her daily Aspirin before having the surgery  Not sure how long to stop aspirin before her eye surgery (Dr. Katy Fitch is doing surgery)  Please advise  ? Reschedule eye lid surgery??   [phone] [name] - daughter (DPR is currently on file)  OK to [facility]

## 2016-05-12 NOTE — ED Notes (Signed)
To MRI

## 2016-05-12 NOTE — Telephone Encounter (Signed)
I would recommend holding off on doing the surgery until she is well for at least 2 weeks. I would drop by on Friday to see her.

## 2016-05-12 NOTE — H&P (Signed)
TRH H&P   Patient Demographics:    Becky Baird, is a 82 y.o. female  MRN: [id number]   DOB - 1933/12/01  Admit Date - 05/12/2016  Outpatient Primary MD for the patient is [name], MD  Outpatient Specialists: Dr Leonie Man    Patient coming from: Home  Chief Complaint  Patient presents with  . Hypertension  . Altered Mental Status      HPI:    Becky Baird  is a 82 y.o. female, With history of stroke in 2010 requiring a right MCA stent placement, dyslipidemia, hypertension, anxiety, who was in usual state of health and was driving to local pharmacy this morning when she all of a sudden forgot how to navigate herself to the pharmacy. She stopped and got directions from her daughter over the phone and reached pharmacy. At the pharmacy she was slightly forgetful and her blood pressure was noted to be very high, she then came to the ER where her symptoms had resolved and head CT was nonacute. She did have evidence of UTI, she is currently symptom free and I was called to admit the patient for possible TIA workup.    Review of systems:    In addition to the HPI above, currently symptom free No Fever-chills, No Headache, No changes with Vision or hearing, No problems swallowing food or Liquids, No Chest pain, Cough or Shortness of Breath, No Abdominal pain, No Nausea or Vommitting, Bowel movements are regular, No Blood in stool or Urine, No dysuria, No new skin rashes or bruises, No new joints pains-aches,  No new weakness, tingling, numbness in any extremity, No recent weight gain or loss, No polyuria, polydypsia or polyphagia, No significant Mental Stressors.  A full 10 point Review of Systems was done,  except as stated above, all other Review of Systems were negative.   With Past History of the following :    Past Medical History:  Diagnosis Date  . Anxiety   . Hyperlipidemia   . Hypertension   . Memory loss   . Mild cognitive impairment, so stated   . Occlusion and stenosis of carotid artery with cerebral infarction    Bilateral less than 50% carotid stenosis.  . Renal cyst   . Stroke ([place])    2010, RMCA with stent  . Toe fracture 11/2015  R toe  . Ulcer [facility])       Past Surgical History:  Procedure Laterality Date  . brain stent    . COLONOSCOPY  03/10/2011   Procedure: COLONOSCOPY;  Surgeon: Rogene Houston, MD;  Location: [facility];  Service: Endoscopy;  Laterality: N/A;  . DILATION AND CURETTAGE OF UTERUS     years ago  . OVARIAN CYST REMOVAL     at least 10 years ago  . TONSILLECTOMY     childhood      Social History:     Social History  Substance Use Topics  . Smoking status: Never Smoker  . Smokeless tobacco: Never Used  . Alcohol use No        Family History :     Family History  Problem Relation Age of Onset  . Stroke Mother   . Heart failure Father   . Diabetes Brother       Home Medications:   Prior to Admission medications   Medication Sig Start Date End Date Taking? Authorizing Provider  ALPRAZolam (XANAX) 0.5 MG tablet TAKE 1/2 TABLET BY MOUTH EVERY 6 HOURS AS NEEDED FOR ANXIETY AND TAKE 1/2 TO 1 TABLET AT BEDTIME AS NEEDED FOR SLEEP 03/29/16  Yes Kathyrn Drown, MD  aspirin 81 MG tablet Take 81 mg by mouth. 2 every day   Yes Historical Provider, MD  carboxymethylcellulose (REFRESH PLUS) 0.5 % SOLN Place 1 drop into both eyes daily as needed (dry eyes).   Yes Historical Provider, MD  Cholecalciferol 2000 UNITS CAPS Take 2,000 Units by mouth daily.   Yes Historical Provider, MD  clobetasol ointment (TEMOVATE) 0.05 % APPLY TO THE AFFECTED AREA THREE TIMES DAILY FOR FOUR WEEKS 02/11/15  Yes Historical Provider, MD  CRANBERRY EXTRACT  PO Take 1 tablet by mouth daily.   Yes Historical Provider, MD  fish oil-omega-3 fatty acids 1000 MG capsule Take 2 g by mouth 2 (two) times daily. Two twice a day    Yes Historical Provider, MD  gabapentin (NEURONTIN) 300 MG capsule TAKE ONE CAPSULE BY MOUTH AT BEDTIME 03/02/16  Yes Kathyrn Drown, MD  hydrochlorothiazide (HYDRODIURIL) 25 MG tablet TAKE ONE TABLET BY MOUTH DAILY 03/21/16  Yes Kathyrn Drown, MD  Iron-FA-B Cmp-C-Biot-Probiotic (FUSION PLUS) CAPS  05/12/14  Yes Historical Provider, MD  loratadine (CLARITIN) 10 MG tablet Take 1 tablet (10 mg total) by mouth daily. 02/06/15  Yes Kathyrn Drown, MD  pantoprazole (PROTONIX) 40 MG tablet Take 1 tablet (40 mg total) by mouth daily. 04/13/16  Yes Kathyrn Drown, MD  potassium chloride (K-DUR,KLOR-CON) 10 MEQ tablet TAKE ONE TABLET BY MOUTH TWICE DAILY 05/09/16  Yes Kathyrn Drown, MD  Red Yeast Rice 600 MG CAPS Take 600 mg by mouth 2 (two) times daily.   Yes Historical Provider, MD  sodium chloride (OCEAN) 0.65 % nasal spray Place 1 spray into the nose as needed for congestion.   Yes Historical Provider, MD     Allergies:     Allergies  Allergen Reactions  . Niaspan [Niacin Er]     rash     Physical Exam:   Vitals  Blood pressure 163/84, pulse (!) 51, temperature 97.9 F (36.6 C), temperature source Oral, resp. rate 18, height 5' 7.5" (1.715 m), weight 70.8 kg (156 lb), SpO2 100 %.   1. General Pleasant elderly white female lying in bed in NAD,     2. Normal affect and insight, Not Suicidal or Homicidal, Awake  Alert, Oriented X 3.  3. No F.N deficits, ALL C.Nerves Intact, Strength 5/5 all 4 extremities, Sensation intact all 4 extremities, Plantars down going.  4. Ears and Eyes appear Normal, Conjunctivae clear, PERRLA. Moist Oral Mucosa.  5. Supple Neck, No JVD, No cervical lymphadenopathy appriciated, No Carotid Bruits.  6. Symmetrical Chest wall movement, Good air movement bilaterally, CTAB.  7. RRR, No Gallops, Rubs or  Murmurs, No Parasternal Heave.  8. Positive Bowel Sounds, Abdomen Soft, No tenderness, No organomegaly appriciated,No rebound -guarding or rigidity.  9.  No Cyanosis, Normal Skin Turgor, No Skin Rash or Bruise.  10. Good muscle tone,  joints appear normal , no effusions, Normal ROM.  11. No Palpable Lymph Nodes in Neck or Axillae      Data Review:    CBC  Recent Labs Lab 05/12/16 1124 05/12/16 1202  WBC 6.1  --   HGB 13.4 13.6  HCT 41.2 40.0  PLT 224  --   MCV 94.7  --   MCH 30.8  --   MCHC 32.5  --   RDW 12.5  --   LYMPHSABS 2.2  --   MONOABS 0.6  --   EOSABS 0.4  --   BASOSABS 0.0  --    ------------------------------------------------------------------------------------------------------------------  Chemistries   Recent Labs Lab 05/12/16 1124 05/12/16 1202  NA 136 139  K 4.2 4.1  CL 100* 100*  CO2 30  --   GLUCOSE 100* 92  BUN 18 17  CREATININE 1.23* 1.40*  CALCIUM 9.5  --   AST 23  --   ALT 18  --   ALKPHOS 78  --   BILITOT 0.5  --    ------------------------------------------------------------------------------------------------------------------ estimated creatinine clearance is 31.8 mL/min (by C-G formula based on SCr of 1.4 mg/dL (H)). ------------------------------------------------------------------------------------------------------------------ No results for input(s): TSH, T4TOTAL, T3FREE, THYROIDAB in the last 72 hours.  Invalid input(s): FREET3  Coagulation profile  Recent Labs Lab 05/12/16 1124  INR 0.97   ------------------------------------------------------------------------------------------------------------------- No results for input(s): DDIMER in the last 72 hours. -------------------------------------------------------------------------------------------------------------------  Cardiac Enzymes No results for input(s): CKMB, TROPONINI, MYOGLOBIN in the last 168 hours.  Invalid input(s):  CK ------------------------------------------------------------------------------------------------------------------ No results found for: BNP   ---------------------------------------------------------------------------------------------------------------  Urinalysis    Component Value Date/Time   COLORURINE YELLOW 05/12/2016 [place] 05/12/2016 1105   LABSPEC <1.005 (L) 05/12/2016 1105   PHURINE 7.0 05/12/2016 1105   GLUCOSEU NEGATIVE 05/12/2016 1105   HGBUR NEGATIVE 05/12/2016 1105   BILIRUBINUR NEGATIVE 05/12/2016 1105   KETONESUR NEGATIVE 05/12/2016 1105   PROTEINUR NEGATIVE 05/12/2016 1105   UROBILINOGEN 0.2 03/05/2015 1513   NITRITE NEGATIVE 05/12/2016 1105   LEUKOCYTESUR SMALL (A) 05/12/2016 1105    ----------------------------------------------------------------------------------------------------------------   Imaging Results:    Dg Chest 2 View  Result Date: 05/12/2016 CLINICAL DATA:  Altered mental status EXAM: CHEST  2 VIEW COMPARISON:  03/05/2015 FINDINGS: The heart size and mediastinal contours are within normal limits. Both lungs are clear. Stable degenerative changes thoracic spine. IMPRESSION: No active cardiopulmonary disease. Electronically Signed   By: Lahoma Crocker M.D.   On: 05/12/2016 12:25   Ct Head Wo Contrast  Result Date: 05/12/2016 CLINICAL DATA:  Altered mental status. EXAM: CT HEAD WITHOUT CONTRAST TECHNIQUE: Contiguous axial images were obtained from the base of the skull through the vertex without intravenous contrast. COMPARISON:  03/05/2015 FINDINGS: Brain: Again noted is a vascular stent in the right MCA. There is also stable encephalomalacia in the right temporal-parietal lobe compatible with  an old infarct. Stable low-density in the periventricular white matter. No evidence for acute hemorrhage, mass lesion, midline shift, hydrocephalus or new large infarct. Old lacune infarct in the right internal capsule anterior limb.  Vascular: Right vertebral artery calcification. Stent in the right MCA. Skull: No calvarial fracture. Sinuses/Orbits: Visualized paranasal sinuses are clear. Other: None. IMPRESSION: No acute intracranial abnormality. Old infarct with right MCA stent. White matter changes most likely represent chronic small vessel ischemic changes. Electronically Signed   By: Markus Daft M.D.   On: 05/12/2016 12:58    My personal review of EKG: Rhythm [place], Rate  49 /min,  No acute changes   Assessment & Plan:     1. Transient amnesia. History of TIA, also has UTI this admission and her blood pressure was noted to be very high by EMS. Most likely UTI related, however TIA/stroke cannot be ruled out, will do a TIA stroke workup with MRI and full stroke protocol, continue home dose aspirin, her symptoms have currently resolved, exam is nonfocal and CT head nonacute. Will have neurology evaluate this evening.  2. UTI. Rocephin, follow cultures.  3. Hypertension. Currently stable, as needed hydralazine for now.  4. Mild ARF. Likely due to dehydration. Stop HCTZ, hydrate and monitor BMP.  5. Dyslipidemia. Continue home medications and check a lipid panel fasting.  6. History of sinus bradycardia. Asymptomatic, avoid rate controlling agents, symptom free. Check TSH.    DVT Prophylaxis Lovenox   AM Labs Ordered, also please review Full Orders  Family Communication: Admission, patients condition and plan of care including tests being ordered have been discussed with the patient and family who indicate understanding and agree with the plan and Code Status.  Code Status Full  Likely DC to  Home in am  Condition Fair  Consults called: Neuro    Admission status: Obs    Time spent in minutes : 30   Lala Lund K M.D on 05/12/2016 at 1:41 PM  Between 7am to 7pm - Pager - [phone]. After 7pm go to [email or web address] - password [email or web address]  [facility] - Office  [phone]

## 2016-05-12 NOTE — ED Provider Notes (Signed)
[facility] DEPT Provider Note   CSN: [id number] Arrival date & time: 05/12/16  1037     History   Chief Complaint Chief Complaint  Patient presents with  . Hypertension  . Altered Mental Status    HPI Becky Baird is a 82 y.o. female.  HPI  Pt was seen at 1055.  Per pt and her family, c/o gradual onset and resolution of one episode of "confusion" that occurred PTA. Pt states "actually the whole morning feels like it was a long time ago." Pt states she left the house a little after 9am to go to the drug store when she "didn't know how to get there." Pt called her daughter, who was able to direct her to the store. Pt had her BP taken at the store and it was "very high" with SBP 200+. Pt's family states pt is currently at her baseline. Denies CP/palpitations, no SOB/cough, no abd pain, no N/V/D, no slurred speech, no focal motor weakness, no tingling/numbness in extremities, no ataxia, no visual changes.   Past Medical History:  Diagnosis Date  . Anxiety   . Hyperlipidemia   . Hypertension   . Memory loss   . Mild cognitive impairment, so stated   . Occlusion and stenosis of carotid artery with cerebral infarction    Bilateral less than 50% carotid stenosis.  . Renal cyst   . Stroke ([place])    2010, RMCA with stent  . Toe fracture 11/2015   R toe  . Ulcer [facility])     Patient Active Problem List   Diagnosis Date Noted  . History of colonic polyps 05/10/2016  . History of stroke 03/03/2015  . GERD (gastroesophageal reflux disease) 02/06/2015  . Aortic sclerosis 12/02/2014  . Osteopenia 01/14/2014  . Prediabetes 08/05/2013  . Hypercalcemia 04/22/2013  . Renal insufficiency 02/15/2013  . Mild cognitive impairment 01/03/2013  . Memory loss 01/03/2013  . Ulcer ([place]) 10/12/2012  . Hypertension 10/12/2012  . Hyperlipidemia 10/12/2012  . CVA (cerebral infarction) 10/12/2012  . HIP PAIN, RIGHT 12/29/2009    Past Surgical History:  Procedure Laterality Date  . brain  stent    . COLONOSCOPY  03/10/2011   Procedure: COLONOSCOPY;  Surgeon: Rogene Houston, MD;  Location: [facility];  Service: Endoscopy;  Laterality: N/A;  . DILATION AND CURETTAGE OF UTERUS     years ago  . OVARIAN CYST REMOVAL     at least 10 years ago  . TONSILLECTOMY     childhood    OB History    No data available       Home Medications    Prior to Admission medications   Medication Sig Start Date End Date Taking? Authorizing Provider  ALPRAZolam (XANAX) 0.5 MG tablet TAKE 1/2 TABLET BY MOUTH EVERY 6 HOURS AS NEEDED FOR ANXIETY AND TAKE 1/2 TO 1 TABLET AT BEDTIME AS NEEDED FOR SLEEP 03/29/16  Yes Kathyrn Drown, MD  aspirin 81 MG tablet Take 81 mg by mouth. 2 every day   Yes Historical Provider, MD  carboxymethylcellulose (REFRESH PLUS) 0.5 % SOLN Place 1 drop into both eyes daily as needed (dry eyes).   Yes Historical Provider, MD  Cholecalciferol 2000 UNITS CAPS Take 2,000 Units by mouth daily.   Yes Historical Provider, MD  clobetasol ointment (TEMOVATE) 0.05 % APPLY TO THE AFFECTED AREA THREE TIMES DAILY FOR FOUR WEEKS 02/11/15  Yes Historical Provider, MD  CRANBERRY EXTRACT PO Take 1 tablet by mouth daily.   Yes Historical  Provider, MD  fish oil-omega-3 fatty acids 1000 MG capsule Take 2 g by mouth 2 (two) times daily. Two twice a day    Yes Historical Provider, MD  gabapentin (NEURONTIN) 300 MG capsule TAKE ONE CAPSULE BY MOUTH AT BEDTIME 03/02/16  Yes Kathyrn Drown, MD  hydrochlorothiazide (HYDRODIURIL) 25 MG tablet TAKE ONE TABLET BY MOUTH DAILY 03/21/16  Yes Kathyrn Drown, MD  Iron-FA-B Cmp-C-Biot-Probiotic (FUSION PLUS) CAPS  05/12/14  Yes Historical Provider, MD  loratadine (CLARITIN) 10 MG tablet Take 1 tablet (10 mg total) by mouth daily. 02/06/15  Yes Kathyrn Drown, MD  pantoprazole (PROTONIX) 40 MG tablet Take 1 tablet (40 mg total) by mouth daily. 04/13/16  Yes Kathyrn Drown, MD  potassium chloride (K-DUR,KLOR-CON) 10 MEQ tablet TAKE ONE TABLET BY MOUTH TWICE DAILY  05/09/16  Yes Kathyrn Drown, MD  Red Yeast Rice 600 MG CAPS Take 600 mg by mouth 2 (two) times daily.   Yes Historical Provider, MD  sodium chloride (OCEAN) 0.65 % nasal spray Place 1 spray into the nose as needed for congestion.   Yes Historical Provider, MD    Family History Family History  Problem Relation Age of Onset  . Stroke Mother   . Heart failure Father   . Diabetes Brother     Social History Social History  Substance Use Topics  . Smoking status: Never Smoker  . Smokeless tobacco: Never Used  . Alcohol use No     Allergies   Niaspan [niacin er]   Review of Systems Review of Systems ROS: Statement: All systems negative except as marked or noted in the HPI; Constitutional: Negative for fever and chills. ; ; Eyes: Negative for eye pain, redness and discharge. ; ; ENMT: Negative for ear pain, hoarseness, nasal congestion, sinus pressure and sore throat. ; ; Cardiovascular: Negative for chest pain, palpitations, diaphoresis, dyspnea and peripheral edema. ; ; Respiratory: Negative for cough, wheezing and stridor. ; ; Gastrointestinal: Negative for nausea, vomiting, diarrhea, abdominal pain, blood in stool, hematemesis, jaundice and rectal bleeding. . ; ; Genitourinary: Negative for dysuria, flank pain and hematuria. ; ; Musculoskeletal: Negative for back pain and neck pain. Negative for swelling and trauma.; ; Skin: Negative for pruritus, rash, abrasions, blisters, bruising and skin lesion.; ; Neuro: +AMS. Negative for headache, lightheadedness and neck stiffness. Negative for weakness, altered level of consciousness, extremity weakness, paresthesias, involuntary movement, seizure and syncope.       Physical Exam Updated Vital Signs BP 163/84 (BP Location: Left Arm)   Pulse (!) 51   Temp 97.9 F (36.6 C) (Oral)   Resp 18   Ht 5' 7.5" (1.715 m)   Wt 156 lb (70.8 kg)   SpO2 100%   BMI 24.07 kg/m   Physical Exam 1100: Physical examination:  Nursing notes reviewed;  Vital signs and O2 SAT reviewed;  Constitutional: Well developed, Well nourished, Well hydrated, In no acute distress; Head:  Normocephalic, atraumatic; Eyes: EOMI, PERRL, No scleral icterus; ENMT: Mouth and pharynx normal, Mucous membranes moist; Neck: Supple, Full range of motion, No lymphadenopathy; Cardiovascular: Bradycardic rate and rhythm, No gallop; Respiratory: Breath sounds clear & equal bilaterally, No wheezes.  Speaking full sentences with ease, Normal respiratory effort/excursion; Chest: Nontender, Movement normal; Abdomen: Soft, Nontender, Nondistended, Normal bowel sounds; Genitourinary: No CVA tenderness; Extremities: Pulses normal, No tenderness, No edema, No calf edema or asymmetry.; Neuro: AA&Ox3, Major CN grossly intact. Speech clear.  No facial droop.  No nystagmus. Grips equal. Strength 5/5  equal bilat UE's and LE's.  DTR 2/4 equal bilat UE's and LE's.  No gross sensory deficits.  Normal cerebellar testing bilat UE's (finger-nose) and LE's (heel-shin)..; Skin: Color normal, Warm, Dry.   ED Treatments / Results  Labs (all labs ordered are listed, but only abnormal results are displayed)   EKG  EKG Interpretation  Date/Time:  Thursday May 12 2016 10:44:01 EDT Ventricular Rate:  49 PR Interval:    QRS Duration: 89 QT Interval:  432 QTC Calculation: 390 R Axis:   -7 Text Interpretation:  Sinus bradycardia Prolonged PR interval Anteroseptal infarct, old When compared with ECG of 03/05/2015 No significant change was found Confirmed by [facility]  MD, Nunzio Cory [phone]) on 05/12/2016 11:37:30 AM       Radiology   Procedures Procedures (including critical care time)  Medications Ordered in ED Medications - No data to display   Initial Impression / Assessment and Plan / ED Course  I have reviewed the triage vital signs and the nursing notes.  Pertinent labs & imaging results that were available during my care of the patient were reviewed by me and considered in my  medical decision making (see chart for details).  MDM Reviewed: previous chart, nursing note and vitals Reviewed previous: labs and ECG Interpretation: labs, ECG, x-ray and CT scan    Results for orders placed or performed during the hospital encounter of 05/12/16  Ethanol  Result Value Ref Range   Alcohol, Ethyl (B) 6 (H) <5 mg/dL  Protime-INR  Result Value Ref Range   Prothrombin Time 12.9 11.4 - 15.2 seconds   INR 0.97   APTT  Result Value Ref Range   aPTT 29 24 - 36 seconds  CBC  Result Value Ref Range   WBC 6.1 4.0 - 10.5 K/uL   RBC 4.35 3.87 - 5.11 MIL/uL   Hemoglobin 13.4 12.0 - 15.0 g/dL   HCT 41.2 36.0 - 46.0 %   MCV 94.7 78.0 - 100.0 fL   MCH 30.8 26.0 - 34.0 pg   MCHC 32.5 30.0 - 36.0 g/dL   RDW 12.5 11.5 - 15.5 %   Platelets 224 150 - 400 K/uL  Differential  Result Value Ref Range   Neutrophils Relative % 48 %   Neutro Abs 3.0 1.7 - 7.7 K/uL   Lymphocytes Relative 35 %   Lymphs Abs 2.2 0.7 - 4.0 K/uL   Monocytes Relative 9 %   Monocytes Absolute 0.6 0.1 - 1.0 K/uL   Eosinophils Relative 7 %   Eosinophils Absolute 0.4 0.0 - 0.7 K/uL   Basophils Relative 1 %   Basophils Absolute 0.0 0.0 - 0.1 K/uL  Comprehensive metabolic panel  Result Value Ref Range   Sodium 136 135 - 145 mmol/L   Potassium 4.2 3.5 - 5.1 mmol/L   Chloride 100 (L) 101 - 111 mmol/L   CO2 30 22 - 32 mmol/L   Glucose, Bld 100 (H) 65 - 99 mg/dL   BUN 18 6 - 20 mg/dL   Creatinine, Ser 1.23 (H) 0.44 - 1.00 mg/dL   Calcium 9.5 8.9 - 10.3 mg/dL   Total Protein 7.4 6.5 - 8.1 g/dL   Albumin 4.0 3.5 - 5.0 g/dL   AST 23 15 - 41 U/L   ALT 18 14 - 54 U/L   Alkaline Phosphatase 78 38 - 126 U/L   Total Bilirubin 0.5 0.3 - 1.2 mg/dL   GFR calc non Af Amer 40 (L) >60 mL/min   GFR calc Af Wyvonnia Lora  47 (L) >60 mL/min   Anion gap 6 5 - 15  Urine rapid drug screen (hosp performed)not at [facility]  Result Value Ref Range   Opiates NONE DETECTED NONE DETECTED   Cocaine NONE DETECTED NONE DETECTED    Benzodiazepines POSITIVE (A) NONE DETECTED   Amphetamines NONE DETECTED NONE DETECTED   Tetrahydrocannabinol NONE DETECTED NONE DETECTED   Barbiturates NONE DETECTED NONE DETECTED  Urinalysis, Routine w reflex microscopic (not at [facility])  Result Value Ref Range   Color, Urine YELLOW YELLOW   APPearance CLEAR CLEAR   Specific Gravity, Urine <1.005 (L) 1.005 - 1.030   pH 7.0 5.0 - 8.0   Glucose, UA NEGATIVE NEGATIVE mg/dL   Hgb urine dipstick NEGATIVE NEGATIVE   Bilirubin Urine NEGATIVE NEGATIVE   Ketones, ur NEGATIVE NEGATIVE mg/dL   Protein, ur NEGATIVE NEGATIVE mg/dL   Nitrite NEGATIVE NEGATIVE   Leukocytes, UA SMALL (A) NEGATIVE  Urine microscopic-add on  Result Value Ref Range   Squamous Epithelial / LPF 0-5 (A) NONE SEEN   WBC, UA 6-30 0 - 5 WBC/hpf   RBC / HPF NONE SEEN 0 - 5 RBC/hpf   Bacteria, UA FEW (A) NONE SEEN  I-Stat Chem 8, ED  (not at [facility], [facility])  Result Value Ref Range   Sodium 139 135 - 145 mmol/L   Potassium 4.1 3.5 - 5.1 mmol/L   Chloride 100 (L) 101 - 111 mmol/L   BUN 17 6 - 20 mg/dL   Creatinine, Ser 1.40 (H) 0.44 - 1.00 mg/dL   Glucose, Bld 92 65 - 99 mg/dL   Calcium, Ion 1.23 1.15 - 1.40 mmol/L   TCO2 28 0 - 100 mmol/L   Hemoglobin 13.6 12.0 - 15.0 g/dL   HCT 40.0 36.0 - 46.0 %  I-stat troponin, ED (not at [facility], [facility])  Result Value Ref Range   Troponin i, poc 0.01 0.00 - 0.08 ng/mL   Comment 3           Dg Chest 2 View Result Date: 05/12/2016 CLINICAL DATA:  Altered mental status EXAM: CHEST  2 VIEW COMPARISON:  03/05/2015 FINDINGS: The heart size and mediastinal contours are within normal limits. Both lungs are clear. Stable degenerative changes thoracic spine. IMPRESSION: No active cardiopulmonary disease. Electronically Signed   By: Lahoma Crocker M.D.   On: 05/12/2016 12:25   Ct Head Wo Contrast Result Date: 05/12/2016 CLINICAL DATA:  Altered mental status. EXAM: CT HEAD WITHOUT CONTRAST TECHNIQUE: Contiguous axial images were obtained from the base of  the skull through the vertex without intravenous contrast. COMPARISON:  03/05/2015 FINDINGS: Brain: Again noted is a vascular stent in the right MCA. There is also stable encephalomalacia in the right temporal-parietal lobe compatible with an old infarct. Stable low-density in the periventricular white matter. No evidence for acute hemorrhage, mass lesion, midline shift, hydrocephalus or new large infarct. Old lacune infarct in the right internal capsule anterior limb. Vascular: Right vertebral artery calcification. Stent in the right MCA. Skull: No calvarial fracture. Sinuses/Orbits: Visualized paranasal sinuses are clear. Other: None. IMPRESSION: No acute intracranial abnormality. Old infarct with right MCA stent. White matter changes most likely represent chronic small vessel ischemic changes. Electronically Signed   By: Markus Daft M.D.   On: 05/12/2016 12:58     1320:  +UTI, UC pending; will dose IV rocephin. Concern for possible TIA; will admit. Dx and testing d/w pt and family.  Questions answered.  Verb understanding, agreeable to admit. T/C to [facility]. Candiss Norse, case  discussed, including:  HPI, pertinent PM/SHx, VS/PE, dx testing, ED course and treatment:  Agreeable to admit, requests he will come to the ED for evaluation.     Final Clinical Impressions(s) / ED Diagnoses   Final diagnoses:  None    New Prescriptions New Prescriptions   No medications on file     Francine Graven, DO 05/15/16 1708

## 2016-05-12 NOTE — ED Triage Notes (Signed)
Pt went to pharmacy to receive TDAP- EMS called for elevated blood pressure. Pt reports she was confused on how to get to the drug store this am. Pt denies any confusion at present. Denies ha/blurred vision/numbness/tingling. nad noted.

## 2016-05-13 ENCOUNTER — Observation Stay (HOSPITAL_COMMUNITY): Payer: Medicare Other

## 2016-05-13 ENCOUNTER — Telehealth: Payer: Self-pay | Admitting: Family Medicine

## 2016-05-13 DIAGNOSIS — G459 Transient cerebral ischemic attack, unspecified: Secondary | ICD-10-CM | POA: Diagnosis not present

## 2016-05-13 DIAGNOSIS — R4182 Altered mental status, unspecified: Secondary | ICD-10-CM | POA: Diagnosis not present

## 2016-05-13 LAB — BASIC METABOLIC PANEL
Anion gap: 7 (ref 5–15)
BUN: 18 mg/dL (ref 6–20)
CHLORIDE: 104 mmol/L (ref 101–111)
CO2: 27 mmol/L (ref 22–32)
CREATININE: 1.28 mg/dL — AB (ref 0.44–1.00)
Calcium: 9.4 mg/dL (ref 8.9–10.3)
GFR calc Af Amer: 45 mL/min — ABNORMAL LOW (ref >60)
GFR calc non Af Amer: 38 mL/min — ABNORMAL LOW (ref >60)
Glucose, Bld: 97 mg/dL (ref 65–99)
Potassium: 4 mmol/L (ref 3.5–5.1)
Sodium: 138 mmol/L (ref 135–145)

## 2016-05-13 LAB — LIPID PANEL
CHOL/HDL RATIO: 3.7 ratio
CHOLESTEROL: 168 mg/dL (ref 0–200)
HDL: 46 mg/dL (ref >40)
LDL Cholesterol: 101 mg/dL — ABNORMAL HIGH (ref 0–99)
Triglycerides: 107 mg/dL (ref <150)
VLDL: 21 mg/dL (ref 0–40)

## 2016-05-13 MED ORDER — AMLODIPINE BESYLATE 10 MG PO TABS: 10.0000 mg | ORAL_TABLET | Freq: Every day | ORAL | 0 refills | Status: DC

## 2016-05-13 MED ORDER — AMLODIPINE BESYLATE 5 MG PO TABS
10.0000 mg | ORAL_TABLET | Freq: Every day | ORAL | Status: DC
  Administered 2016-05-13: 10 mg via ORAL
  Filled 2016-05-13: qty 2

## 2016-05-13 MED ORDER — CEFPODOXIME PROXETIL 200 MG PO TABS: 200.0000 mg | ORAL_TABLET | Freq: Two times a day (BID) | ORAL | 0 refills | Status: DC

## 2016-05-13 NOTE — Telephone Encounter (Signed)
Please see other phone message-patient was released-needs follow-up Monday or Tuesday bring all medicines with visit.

## 2016-05-13 NOTE — Discharge Instructions (Signed)
Follow with Primary MD [name], MD in 2-3 days   Get CBC, CMP,   by Primary MD 2-3 days ( we routinely change or add medications that can affect your baseline labs and fluid status, therefore we recommend that you get the mentioned basic workup next visit with your PCP, your PCP may decide not to get them or add new tests based on their clinical decision)   Activity: As tolerated with Full fall precautions use walker/cane & assistance as needed   Disposition Home     Diet:   Heart Healthy .  For Heart failure patients - Check your Weight same time everyday, if you gain over 2 pounds, or you develop in leg swelling, experience more shortness of breath or chest pain, call your Primary MD immediately. Follow Cardiac Low Salt Diet and 1.5 lit/day fluid restriction.   On your next visit with your primary care physician please Get Medicines reviewed and adjusted.   Please request your Prim.MD to go over all Hospital Tests and Procedure/Radiological results at the follow up, please get all Hospital records sent to your Prim MD by signing hospital release before you go home.   If you experience worsening of your admission symptoms, develop shortness of breath, life threatening emergency, suicidal or homicidal thoughts you must seek medical attention immediately by calling 911 or calling your MD immediately  if symptoms less severe.  You Must read complete instructions/literature along with all the possible adverse reactions/side effects for all the Medicines you take and that have been prescribed to you. Take any new Medicines after you have completely understood and accpet all the possible adverse reactions/side effects.   Do not drive, operate heavy machinery, perform activities at heights, swimming or participation in water activities or provide baby sitting services if your were admitted for syncope or siezures until you have seen by Primary MD or a Neurologist and advised to do so  again.  Do not drive when taking Pain medications.    Do not take more than prescribed Pain, Sleep and Anxiety Medications  Special Instructions: If you have smoked or chewed Tobacco  in the last 2 yrs please stop smoking, stop any regular Alcohol  and or any Recreational drug use.  Wear Seat belts while driving.   Please note  You were cared for by a hospitalist during your hospital stay. If you have any questions about your discharge medications or the care you received while you were in the hospital after you are discharged, you can call the unit and asked to speak with the hospitalist on call if the hospitalist that took care of you is not available. Once you are discharged, your primary care physician will handle any further medical issues. Please note that NO REFILLS for any discharge medications will be authorized once you are discharged, as it is imperative that you return to your primary care physician (or establish a relationship with a primary care physician if you do not have one) for your aftercare needs so that they can reassess your need for medications and monitor your lab values.

## 2016-05-13 NOTE — Progress Notes (Signed)
Patient discharged home.  IV removed - WNL.  Reviewed DC instructions and medications.  Verbalized understanding.  To follow up with PCP and neuro.  Patient ambulated off floor in NAD with RN supervision

## 2016-05-13 NOTE — Discharge Summary (Signed)
Becky Baird [id number] DOB: Aug 02, 1933 DOA: 05/12/2016  PCP: [name], MD  Admit date: 05/12/2016  Discharge date: 05/13/2016  Admitted From: Home   Disposition:  Home   Recommendations for Outpatient Follow-up:   Follow up with PCP in 1-2 weeks  PCP Please obtain BMP/CBC, 2 view CXR in 1week,  (see Discharge instructions)   PCP Please follow up on the following pending results: Although pending A1c results, outpatient EEG within a week   Home Health: None   Equipment/Devices: None  Consultations: Neuro Dr Leonie Man over the phone Discharge Condition: Stable   CODE STATUS: Full   Diet Recommendation: Heart Healthy    Chief Complaint  Patient presents with  . Hypertension  . Altered Mental Status     Brief history of present illness from the day of admission and additional interim summary     Becky Baird  is a 82 y.o. female, With history of stroke in 2010 requiring a right MCA stent placement, dyslipidemia, hypertension, anxiety, who was in usual state of health and was driving to local pharmacy this morning when she all of a sudden forgot how to navigate herself to the pharmacy. She stopped and got directions from her daughter over the phone and reached pharmacy. At the pharmacy she was slightly forgetful and her blood pressure was noted to be very high, she then came to the ER where her symptoms had resolved and head CT was nonacute. She did have evidence of UTI, she is currently symptom free and I was called to admit the patient for possible TIA workup.    Hospital issues addressed    1. Transient amnesia. History of TIA, also has UTI this admission and her blood pressure was noted to be very high by EMS. Symptoms Most likely UTI related, Stroke was ruled out with a negative MRI in the light  of her stroke history, her case was discussed on phone with her neurologist Dr. Leonie Man patient most likely had transient amnesia UTI related however to complete the workup and small possibility of partial seizure patient will undergo EEG in Dr. Clydene Fake office, he has been requested to follow with Dr. Leonie Man within a week. Her symptoms have completely resolved no further workup per neurology and she will be discharged home on UTI treatment.  2. UTI. Sporn did well to Rocephin will get 4 more days of Vantin.  3. Hypertension. Blood pressure was fairly stable in the hospital, she was placed on Norvasc and will be discharged on the same. Since she was dehydrated with ARF upon admission her HCTZ and potassium supplementation will be stopped.  4. Mild ARF. Likely due to dehydration. Stop HCTZ, improved with hydration, follow with PCP.  5. Dyslipidemia. Continue home medications LDL is above goal will request PCP to consider placing her on statin in the outpatient setting was secondary prevention in the light of history of stroke. Her A1c results are pending PCP to follow.  6. History of sinus bradycardia. Asymptomatic, avoid rate controlling agents, symptom free. Check TSH.  Lab Results  Component Value Date   CHOL 168 05/13/2016   HDL 46 05/13/2016   LDLCALC 101 (H) 05/13/2016   TRIG 107 05/13/2016   CHOLHDL 3.7 05/13/2016      Discharge diagnosis     Principal Problem:   Cerebral infarction [facility]) Active Problems:   Hypertension   Hyperlipidemia   Renal insufficiency   TIA (transient ischemic attack)    Discharge instructions    Discharge Instructions    Diet - low sodium heart healthy    Complete by:  As directed    Discharge instructions    Complete by:  As directed    Follow with Primary MD [name], MD in 2-3 days   Get CBC, CMP,   by Primary MD 2-3 days ( we routinely change or add medications that can affect your baseline labs and fluid status, therefore we  recommend that you get the mentioned basic workup next visit with your PCP, your PCP may decide not to get them or add new tests based on their clinical decision)   Activity: As tolerated with Full fall precautions use walker/cane & assistance as needed   Disposition Home     Diet:   Heart Healthy .  For Heart failure patients - Check your Weight same time everyday, if you gain over 2 pounds, or you develop in leg swelling, experience more shortness of breath or chest pain, call your Primary MD immediately. Follow Cardiac Low Salt Diet and 1.5 lit/day fluid restriction.   On your next visit with your primary care physician please Get Medicines reviewed and adjusted.   Please request your Prim.MD to go over all Hospital Tests and Procedure/Radiological results at the follow up, please get all Hospital records sent to your Prim MD by signing hospital release before you go home.   If you experience worsening of your admission symptoms, develop shortness of breath, life threatening emergency, suicidal or homicidal thoughts you must seek medical attention immediately by calling 911 or calling your MD immediately  if symptoms less severe.  You Must read complete instructions/literature along with all the possible adverse reactions/side effects for all the Medicines you take and that have been prescribed to you. Take any new Medicines after you have completely understood and accpet all the possible adverse reactions/side effects.   Do not drive, operate heavy machinery, perform activities at heights, swimming or participation in water activities or provide baby sitting services if your were admitted for syncope or siezures until you have seen by Primary MD or a Neurologist and advised to do so again.  Do not drive when taking Pain medications.    Do not take more than prescribed Pain, Sleep and Anxiety Medications  Special Instructions: If you have smoked or chewed Tobacco  in the last 2 yrs  please stop smoking, stop any regular Alcohol  and or any Recreational drug use.  Wear Seat belts while driving.   Please note  You were cared for by a hospitalist during your hospital stay. If you have any questions about your discharge medications or the care you received while you were in the hospital after you are discharged, you can call the unit and asked to speak with the hospitalist on call if the hospitalist that took care of you is not available. Once you are discharged, your primary care physician will handle any further medical issues. Please note that NO REFILLS for any discharge medications will be authorized once you are discharged, as it is  imperative that you return to your primary care physician (or establish a relationship with a primary care physician if you do not have one) for your aftercare needs so that they can reassess your need for medications and monitor your lab values.   Increase activity slowly    Complete by:  As directed       Discharge Medications     Medication List    STOP taking these medications   hydrochlorothiazide 25 MG tablet Commonly known as:  HYDRODIURIL   potassium chloride 10 MEQ tablet Commonly known as:  K-DUR,KLOR-CON     TAKE these medications   ALPRAZolam 0.5 MG tablet Commonly known as:  XANAX TAKE 1/2 TABLET BY MOUTH EVERY 6 HOURS AS NEEDED FOR ANXIETY AND TAKE 1/2 TO 1 TABLET AT BEDTIME AS NEEDED FOR SLEEP   amLODipine 10 MG tablet Commonly known as:  NORVASC Take 1 tablet (10 mg total) by mouth daily.   aspirin 81 MG tablet Take 81 mg by mouth. 2 every day   carboxymethylcellulose 0.5 % Soln Commonly known as:  REFRESH PLUS Place 1 drop into both eyes daily as needed (dry eyes).   cefpodoxime 200 MG tablet Commonly known as:  VANTIN Take 1 tablet (200 mg total) by mouth 2 (two) times daily.   Cholecalciferol 2000 units Caps Take 2,000 Units by mouth daily.   clobetasol ointment 0.05 % Commonly known as:   TEMOVATE APPLY TO THE AFFECTED AREA THREE TIMES DAILY FOR FOUR WEEKS   CRANBERRY EXTRACT PO Take 1 tablet by mouth daily.   fish oil-omega-3 fatty acids 1000 MG capsule Take 2 g by mouth 2 (two) times daily. Two twice a day   FUSION PLUS Caps   gabapentin 300 MG capsule Commonly known as:  NEURONTIN TAKE ONE CAPSULE BY MOUTH AT BEDTIME   loratadine 10 MG tablet Commonly known as:  CLARITIN Take 1 tablet (10 mg total) by mouth daily.   pantoprazole 40 MG tablet Commonly known as:  PROTONIX Take 1 tablet (40 mg total) by mouth daily.   Red Yeast Rice 600 MG Caps Take 600 mg by mouth 2 (two) times daily.   sodium chloride 0.65 % nasal spray Commonly known as:  OCEAN Place 1 spray into the nose as needed for congestion.       Follow-up Information    [name], MD. Schedule an appointment as soon as possible for a visit in 3 day(s).   Specialty:  Family Medicine Contact information: [address] [place] [address] [phone]        SETHI,PRAMOD, MD. Schedule an appointment as soon as possible for a visit in 4 week(s).   Specialties:  Neurology, Radiology Contact information: [address] [place] [address] [phone]           Major procedures and Radiology Reports - PLEASE review detailed and final reports thoroughly  -         Dg Chest 2 View  Result Date: 05/12/2016 CLINICAL DATA:  Altered mental status EXAM: CHEST  2 VIEW COMPARISON:  03/05/2015 FINDINGS: The heart size and mediastinal contours are within normal limits. Both lungs are clear. Stable degenerative changes thoracic spine. IMPRESSION: No active cardiopulmonary disease. Electronically Signed   By: Lahoma Crocker M.D.   On: 05/12/2016 12:25   Ct Head Wo Contrast  Result Date: 05/12/2016 CLINICAL DATA:  Altered mental status. EXAM: CT HEAD WITHOUT CONTRAST TECHNIQUE: Contiguous axial images were obtained from the base of  the skull through the vertex  without intravenous contrast. COMPARISON:  03/05/2015 FINDINGS: Brain: Again noted is a vascular stent in the right MCA. There is also stable encephalomalacia in the right temporal-parietal lobe compatible with an old infarct. Stable low-density in the periventricular white matter. No evidence for acute hemorrhage, mass lesion, midline shift, hydrocephalus or new large infarct. Old lacune infarct in the right internal capsule anterior limb. Vascular: Right vertebral artery calcification. Stent in the right MCA. Skull: No calvarial fracture. Sinuses/Orbits: Visualized paranasal sinuses are clear. Other: None. IMPRESSION: No acute intracranial abnormality. Old infarct with right MCA stent. White matter changes most likely represent chronic small vessel ischemic changes. Electronically Signed   By: Markus Daft M.D.   On: 05/12/2016 12:58   Mr Brain Wo Contrast  Result Date: 05/12/2016 CLINICAL DATA:  82 year old female with altered mental status and weakness for 2 days. Initial encounter. EXAM: MRI HEAD WITHOUT CONTRAST TECHNIQUE: Multiplanar, multiecho pulse sequences of the brain and surrounding structures were obtained without intravenous contrast. COMPARISON:  Head CT without contrast 1222 hours today. Brain MRI 03/05/2015. FINDINGS: Brain: No restricted diffusion or evidence of acute infarction. Chronic infarcts in the posterior right MCA territory, and both cerebellar hemispheres. Moderate-sized area of chronic encephalomalacia in the posterior right hemisphere with hemosiderin is stable since 2016. Scattered small chronic lacunar infarcts in the cerebral white matter are stable. Small chronic lacunar infarct in the right caudate is stable. Other deep gray matter nuclei are within normal limits. The brainstem remains normal. No midline shift, mass effect, evidence of mass lesion, ventriculomegaly, extra-axial collection or acute intracranial hemorrhage. Cervicomedullary junction and pituitary are within  normal limits. Vascular: Major intracranial vascular flow voids are stable since 2016. Skull and upper cervical spine: Negative. Visualized bone marrow signal is within normal limits. Sinuses/Orbits: Stable and negative. Other: Visible internal auditory structures appear normal. Mastoids are clear. Negative scalp soft tissues. IMPRESSION: 1.  No acute intracranial abnormality. 2. Stable MRI appearance of the brain since 2016 with chronic ischemic disease, worst in the posterior right MCA territory. Chronic small vessel disease affecting the cerebral white matter, right basal ganglia, and both cerebellar hemispheres. Electronically Signed   By: Genevie Ann M.D.   On: 05/12/2016 14:21    Micro Results    No results found for this or any previous visit (from the past 240 hour(s)).  Today   Subjective    Becky Baird today has no headache,no chest abdominal pain,no new weakness tingling or numbness, feels much better wants to go home today.     Objective   Blood pressure (!) 146/51, pulse (!) 53, temperature 97.6 F (36.4 C), temperature source Oral, resp. rate 16, height 5' 7.5" (1.715 m), weight 70.9 kg (156 lb 6.4 oz), SpO2 96 %.   Intake/Output Summary (Last 24 hours) at 05/13/16 0917 Last data filed at 05/13/16 0500  Gross per 24 hour  Intake              240 ml  Output              501 ml  Net             -261 ml    Exam Awake Alert, Oriented x 3, No new F.N deficits, Normal affect [place].AT,PERRAL Supple Neck,No JVD, No cervical lymphadenopathy appriciated.  Symmetrical Chest wall movement, Good air movement bilaterally, CTAB RRR,No Gallops,Rubs or new Murmurs, No Parasternal Heave +ve B.Sounds, Abd Soft, Non tender, No organomegaly appriciated, No rebound -guarding  or rigidity. No Cyanosis, Clubbing or edema, No new Rash or bruise   Data Review   CBC w Diff:  Lab Results  Component Value Date   WBC 6.1 05/12/2016   HGB 13.6 05/12/2016   HCT 40.0 05/12/2016   HCT 41.1  04/13/2016   PLT 224 05/12/2016   PLT 274 04/13/2016   LYMPHOPCT 35 05/12/2016   MONOPCT 9 05/12/2016   EOSPCT 7 05/12/2016   BASOPCT 1 05/12/2016    CMP:  Lab Results  Component Value Date   NA 138 05/13/2016   NA 142 03/31/2016   K 4.0 05/13/2016   CL 104 05/13/2016   CO2 27 05/13/2016   BUN 18 05/13/2016   BUN 16 03/31/2016   CREATININE 1.28 (H) 05/13/2016   CREATININE 1.21 (H) 05/29/2014   PROT 7.4 05/12/2016   PROT 7.5 03/31/2016   ALBUMIN 4.0 05/12/2016   ALBUMIN 4.8 (H) 03/31/2016   BILITOT 0.5 05/12/2016   BILITOT 0.6 03/31/2016   ALKPHOS 78 05/12/2016   AST 23 05/12/2016   ALT 18 05/12/2016  .   Total Time in preparing paper work, data evaluation and todays exam - 35 minutes  Thurnell Lose M.D on 05/13/2016 at 9:17 AM  [facility]   Office  [phone]

## 2016-05-13 NOTE — Telephone Encounter (Signed)
[facility] 05/13/16

## 2016-05-13 NOTE — Telephone Encounter (Signed)
[facility] see other message 05/13/16

## 2016-05-13 NOTE — Care Management Note (Signed)
Case Management Note  Patient Details  Name: Becky Baird MRN: [id number] Date of Birth: November 14, 1933  Subjective/Objective:  Patient adm from home alone for TIA workup.  She is currently alert and oriented. She is ind with ADL's. Still drives to appointments. She has a PCP, transportation, and reports no issues affording medications.               Action/Plan: Will discharge home with self care. Declines any home health services.    Expected Discharge Date:     05/13/2016             Expected Discharge Plan:  Home/Self Care  In-House Referral:  NA  Discharge planning Services  CM Consult  Post Acute Care Choice:    Choice offered to:  Patient  DME Arranged:    DME Agency:     HH Arranged:    [place] Agency:     Status of Service:  Completed, signed off  If discussed at [employer] of Stay Meetings, dates discussed:    Additional Comments:  Jamita Mckelvin, Chauncey Reading, RN 05/13/2016, 9:25 AM

## 2016-05-13 NOTE — Telephone Encounter (Signed)
This patient is being released today from the hospital. She needs to follow-up in the office Monday or Tuesday. She needs to bring all of her medicines with her when she comes. See other telephone message-I recommend postponing her eye surgery until she is significantly improved we will discuss this further when she follows up next week. Patient was sent home on antibiotics-please reaffirm with her the necessity to keep taking that. Nurse's-he may have to call the daughter with this information the patient may or may not be available by phone

## 2016-05-14 LAB — HEMOGLOBIN A1C
Hgb A1c MFr Bld: 5.7 % — ABNORMAL HIGH (ref 4.8–5.6)
MEAN PLASMA GLUCOSE: 117 mg/dL

## 2016-05-15 LAB — URINE CULTURE

## 2016-05-17 ENCOUNTER — Ambulatory Visit (INDEPENDENT_AMBULATORY_CARE_PROVIDER_SITE_OTHER): Payer: Medicare Other | Admitting: Family Medicine

## 2016-05-17 ENCOUNTER — Encounter: Payer: Self-pay | Admitting: Family Medicine

## 2016-05-17 VITALS — BP 150/76 | Ht 67.5 in | Wt 162.0 lb

## 2016-05-17 DIAGNOSIS — E7849 Other hyperlipidemia: Secondary | ICD-10-CM

## 2016-05-17 DIAGNOSIS — E784 Other hyperlipidemia: Secondary | ICD-10-CM | POA: Diagnosis not present

## 2016-05-17 DIAGNOSIS — N289 Disorder of kidney and ureter, unspecified: Secondary | ICD-10-CM

## 2016-05-17 DIAGNOSIS — I10 Essential (primary) hypertension: Secondary | ICD-10-CM | POA: Diagnosis not present

## 2016-05-17 NOTE — Telephone Encounter (Signed)
Patient has an appointment for hospital follow up 05/17/16 at 10am with Dr Nicki Reaper

## 2016-05-17 NOTE — Progress Notes (Signed)
   Subjective:    Patient ID: Becky Baird, female    DOB: January 26, 1934, 82 y.o.   MRN: [id number]  HPI Patient in today for a hospitalization follow up. Patient was hospitalized for altered mental status and urinary tract infections. Patient was prescribed vantin and took last dose this morning.   patient denies any ongoing dysuria urinary frequency. The patient told me her story. She got confuse going to a place she knew how to get to. She ended up having to stop and get family to help her with directions. She had elevated blood pressure. She was seen in the pharmacy they convinced her to go to EMS and go to the hospital. Patient had numerous testing done which ruled out the possibility of stroke. States no other concerns this visit.    Review of Systems  Constitutional: Negative for activity change, fatigue and fever.  Respiratory: Negative for cough and shortness of breath.   Cardiovascular: Negative for chest pain and leg swelling.  Neurological: Negative for headaches.       Objective:   Physical Exam  Constitutional: She appears well-nourished. No distress.  Cardiovascular: Normal rate, regular rhythm and normal heart sounds.   No murmur heard. Pulmonary/Chest: Effort normal and breath sounds normal. No respiratory distress.  Musculoskeletal: She exhibits no edema.  Lymphadenopathy:    She has no cervical adenopathy.  Neurological: She is alert. She exhibits normal muscle tone.  Psychiatric: Her behavior is normal.  Vitals reviewed.         Assessment & Plan:  Transitional care was given today  Patient will come back to give a urine specimen next week for urine culture  I do not feel the patient had a TIA I don't feel additional imaging is necessary  Patient does not tolerate statins. Continue red rice C6 extract  Prediabetes watch diet closely  Blood pressure decent control on current medication check kidney function. May continue hydrochlorothiazide. Stay away  from amlodipine.

## 2016-05-18 ENCOUNTER — Encounter: Payer: Self-pay | Admitting: Family Medicine

## 2016-05-18 LAB — BASIC METABOLIC PANEL
BUN/Creatinine Ratio: 13 (ref 12–28)
BUN: 14 mg/dL (ref 8–27)
CALCIUM: 10 mg/dL (ref 8.7–10.3)
CHLORIDE: 94 mmol/L — AB (ref 96–106)
CO2: 24 mmol/L (ref 18–29)
Creatinine, Ser: 1.08 mg/dL — ABNORMAL HIGH (ref 0.57–1.00)
GFR calc Af Amer: 56 mL/min/{1.73_m2} — ABNORMAL LOW (ref >59)
GFR calc non Af Amer: 49 mL/min/{1.73_m2} — ABNORMAL LOW (ref >59)
GLUCOSE: 97 mg/dL (ref 65–99)
POTASSIUM: 4.5 mmol/L (ref 3.5–5.2)
Sodium: 134 mmol/L (ref 134–144)

## 2016-05-19 NOTE — Progress Notes (Signed)
Eye surgery has been rescheduled for June 23, 2016

## 2016-05-20 ENCOUNTER — Telehealth: Payer: Self-pay | Admitting: *Deleted

## 2016-05-20 ENCOUNTER — Other Ambulatory Visit: Payer: Self-pay | Admitting: Family Medicine

## 2016-05-20 DIAGNOSIS — N390 Urinary tract infection, site not specified: Secondary | ICD-10-CM

## 2016-05-20 NOTE — Telephone Encounter (Signed)
Pt just out of hospital for UTI and she has been drinking lots of water. Finished antibiotics. No symptoms. No dysuria, no abd pain, no back pain, no fever, no frequent urination. Pt just concerned bc urine is a little darker. Consult with dr Nicki Reaper. Have pt to come and leave urine sample to send to lab for urine culture.

## 2016-05-22 ENCOUNTER — Encounter: Payer: Self-pay | Admitting: Family Medicine

## 2016-05-23 ENCOUNTER — Other Ambulatory Visit: Payer: Self-pay | Admitting: *Deleted

## 2016-05-23 ENCOUNTER — Telehealth: Payer: Self-pay | Admitting: *Deleted

## 2016-05-23 LAB — URINE CULTURE

## 2016-05-23 MED ORDER — AMOXICILLIN 500 MG PO CAPS: 500.0000 mg | ORAL_CAPSULE | Freq: Three times a day (TID) | ORAL | 0 refills | Status: DC

## 2016-05-23 NOTE — Telephone Encounter (Signed)
Pt wants results of urine culture and also needs to know if you still want her to come in and give a urine sample tomorrow.

## 2016-05-23 NOTE — Telephone Encounter (Signed)
Discussed with pt. Med sent to pharm. Pt verbalized understanding.

## 2016-05-23 NOTE — Telephone Encounter (Signed)
Urine culture does in fact show some enterococcus. I would recommend amoxicillin 500 mg 1 3 times a day for 7 days. I recommend a repeat urine and urine culture in approximately 10-14 days.

## 2016-05-23 NOTE — Progress Notes (Unsigned)
aqmox

## 2016-05-26 ENCOUNTER — Ambulatory Visit: Payer: Medicare Other | Admitting: Family Medicine

## 2016-06-03 ENCOUNTER — Telehealth: Payer: Self-pay | Admitting: Family Medicine

## 2016-06-03 NOTE — Telephone Encounter (Signed)
It was recommended to the patient to have a Tdap shot because of her grandchildren.  She is wanting to know if it is okay for her to have this?

## 2016-06-05 NOTE — Telephone Encounter (Signed)
It is fine for her to have this in order to lessen the risk of pertussis to the grandchildren. Medicare may not necessarily pay for it.(She could get this at [facility] drug for probably the best price-Medicare may or may not pay for this) if she gets here she will need to sign a Medicare waiver with the understanding that if Medicare does not cover it she will be responsible for the cost of the vaccine

## 2016-06-06 NOTE — Telephone Encounter (Signed)
Spoke with patient and informed her per Dr.Scott Luking- It is fine for you  to have this in order to lessen the risk of pertussis to the grandchildren. Medicare may not necessarily pay for it. You  could get this at [facility] drug for probably the best price-Medicare may or may not pay for this) if you get it here she will need to sign a Medicare waiver with the understanding that if Medicare does not cover it you will be responsible for the cost of the vaccine. Patient verbalized understanding.

## 2016-06-07 ENCOUNTER — Ambulatory Visit: Payer: Medicare Other | Admitting: Family Medicine

## 2016-06-10 ENCOUNTER — Other Ambulatory Visit: Payer: Self-pay

## 2016-06-10 ENCOUNTER — Telehealth: Payer: Self-pay | Admitting: Family Medicine

## 2016-06-10 ENCOUNTER — Other Ambulatory Visit: Payer: Self-pay | Admitting: Family Medicine

## 2016-06-10 DIAGNOSIS — R309 Painful micturition, unspecified: Secondary | ICD-10-CM

## 2016-06-10 LAB — POCT URINALYSIS DIPSTICK
Blood, UA: NEGATIVE
Leukocytes, UA: NEGATIVE
SPEC GRAV UA: 1.015
pH, UA: 5

## 2016-06-10 NOTE — Telephone Encounter (Signed)
Please see result note stating same message.

## 2016-06-10 NOTE — Telephone Encounter (Signed)
Patient came by to give urine sample for painful urination. Per Dr.Scott urine dipstick and Urine culture was ordered.

## 2016-06-10 NOTE — Telephone Encounter (Signed)
Please let the patient know that urine under the microscope showed occasional WBC it was sent off for urine culture we will await the result of that before deciding on any medication

## 2016-06-14 ENCOUNTER — Telehealth: Payer: Self-pay | Admitting: Family Medicine

## 2016-06-14 LAB — URINE CULTURE

## 2016-06-14 LAB — PLEASE NOTE

## 2016-06-14 NOTE — Telephone Encounter (Signed)
Patient is down to have eye surgery on June 23, 2016.  Becky Baird at [facility] wanted to verify that it is okay for the patient to stop taking her aspirin a week before the surgery?

## 2016-06-19 NOTE — Telephone Encounter (Signed)
Patient may hold aspirin before surgery- may hold 7 days before surgery

## 2016-06-20 NOTE — Telephone Encounter (Signed)
Discussed with Tracy

## 2016-06-20 NOTE — Telephone Encounter (Signed)
Left message to return call with Olivia Mackie at [facility]

## 2016-06-23 DIAGNOSIS — H02831 Dermatochalasis of right upper eyelid: Secondary | ICD-10-CM | POA: Diagnosis not present

## 2016-06-23 DIAGNOSIS — I4819 Other persistent atrial fibrillation: Secondary | ICD-10-CM

## 2016-06-23 DIAGNOSIS — H53453 Other localized visual field defect, bilateral: Secondary | ICD-10-CM | POA: Diagnosis not present

## 2016-06-23 DIAGNOSIS — H02834 Dermatochalasis of left upper eyelid: Secondary | ICD-10-CM | POA: Diagnosis not present

## 2016-06-23 HISTORY — DX: Other persistent atrial fibrillation: I48.19

## 2016-06-27 ENCOUNTER — Ambulatory Visit (INDEPENDENT_AMBULATORY_CARE_PROVIDER_SITE_OTHER): Payer: Medicare Other | Admitting: Physician Assistant

## 2016-06-27 ENCOUNTER — Encounter: Payer: Self-pay | Admitting: Physician Assistant

## 2016-06-27 ENCOUNTER — Encounter (INDEPENDENT_AMBULATORY_CARE_PROVIDER_SITE_OTHER): Payer: Medicare Other

## 2016-06-27 ENCOUNTER — Telehealth: Payer: Self-pay | Admitting: Physician Assistant

## 2016-06-27 VITALS — BP 134/60 | HR 59 | Ht 67.0 in | Wt 154.4 lb

## 2016-06-27 DIAGNOSIS — I480 Paroxysmal atrial fibrillation: Secondary | ICD-10-CM

## 2016-06-27 DIAGNOSIS — Z7901 Long term (current) use of anticoagulants: Secondary | ICD-10-CM | POA: Diagnosis not present

## 2016-06-27 DIAGNOSIS — R001 Bradycardia, unspecified: Secondary | ICD-10-CM | POA: Diagnosis not present

## 2016-06-27 MED ORDER — RIVAROXABAN 15 MG PO TABS: 15.0000 mg | ORAL_TABLET | Freq: Two times a day (BID) | ORAL | 11 refills | Status: DC

## 2016-06-27 MED ORDER — RIVAROXABAN 15 MG PO TABS: 15.0000 mg | ORAL_TABLET | Freq: Every day | ORAL | 11 refills | Status: DC

## 2016-06-27 NOTE — Progress Notes (Signed)
Cardiology Office Note   Date:  06/27/2016   ID:  Becky Baird, DOB 1934-01-18, MRN [id number]  PCP:  Becky Lange, MD  Cardiologist:  [name] 2016 Becky Baird, [place]   Chief Complaint  Patient presents with  . Follow-up    new onset afib    History of Present Illness: Becky Baird is a 82 y.o. female with a history of SEM, HTN, HLD, cerebrovascular disease, CVA w/ R-MCA stent 2010, EF nl w/ no WMA on echo 2015.  Becky Baird had a minor surgical procedure on 11/30 and was in atrial fib>>cards appt made.  Becky Baird presents for evaluation of atrial fib.  She had no idea she was in afib. She never feels her heart skip or race. She never gets light-headed or dizzy. She remembers being told her heart rate was low and that she might need a PPM at some point.   She had a stress fx of her foot last summer. She wore a boot and has had a hard time getting her stamina back. She started back doing aerobics a month or 2 ago. Feels she is doing well with this, but does not have as much energy as she once did. She notices increased DOE, but feels that is appropriate after her layoff.  In October, she was driving to a place she knew well and got lost. She called her daughter, who directed her. She mental status issues with her CVA, but they were different. Her BP was very high with the CVA, she was taken by EMS to [facility] where it was diagnosed. She has not had any more episodes of confusion.   Past Medical History:  Diagnosis Date  . Anxiety   . Hyperlipidemia   . Hypertension   . Memory loss   . Mild cognitive impairment, Baird stated   . Occlusion and stenosis of carotid artery with cerebral infarction    Bilateral less than 50% carotid stenosis.  . Renal cyst   . Stroke ([place])    2010, RMCA with stent  . Toe fracture 11/2015   R toe  . Ulcer ([place]) 1970s    Past Surgical History:  Procedure Laterality Date  . brain stent    . COLONOSCOPY  03/10/2011   Procedure:  COLONOSCOPY;  Surgeon: Rogene Houston, MD;  Location: [facility];  Service: Endoscopy;  Laterality: N/A;  . DILATION AND CURETTAGE OF UTERUS     years ago  . OVARIAN CYST REMOVAL     at least 10 years ago  . TONSILLECTOMY     childhood    Current Outpatient Prescriptions  Medication Sig Dispense Refill  . ALPRAZolam (XANAX) 0.5 MG tablet TAKE 1/2 TABLET BY MOUTH EVERY 6 HOURS AS NEEDED FOR ANXIETY AND TAKE 1/2 TO 1 TABLET AT BEDTIME AS NEEDED FOR SLEEP 45 tablet 3  . amLODipine (NORVASC) 10 MG tablet Take 1 tablet by mouth daily.    Marland Kitchen aspirin 81 MG tablet Take 81 mg by mouth. 2 every day    . carboxymethylcellulose (REFRESH PLUS) 0.5 % SOLN Place 1 drop into both eyes daily as needed (dry eyes).    . Cholecalciferol 2000 UNITS CAPS Take 2,000 Units by mouth daily.    . clobetasol ointment (TEMOVATE) 0.05 % APPLY TO THE AFFECTED AREA THREE TIMES DAILY FOR FOUR WEEKS  1  . CRANBERRY EXTRACT PO Take 1 tablet by mouth daily.    . fish oil-omega-3 fatty acids 1000 MG capsule  Take 2 g by mouth 2 (two) times daily. Two twice a day     . hydrochlorothiazide (HYDRODIURIL) 25 MG tablet Take 25 mg by mouth daily.  5  . Iron-FA-B Cmp-C-Biot-Probiotic (FUSION PLUS) CAPS   3  . loratadine (CLARITIN) 10 MG tablet Take 1 tablet (10 mg total) by mouth daily. 30 tablet 5  . pantoprazole (PROTONIX) 40 MG tablet Take 1 tablet (40 mg total) by mouth daily. 30 tablet 5  . potassium chloride (K-DUR,KLOR-CON) 10 MEQ tablet 10 mEq 2 (two) times daily.    . Red Yeast Rice 600 MG CAPS Take 600 mg by mouth 2 (two) times daily.    . sodium chloride (OCEAN) 0.65 % nasal spray Place 1 spray into the nose as needed for congestion.     No current facility-administered medications for this visit.     Allergies:   Amlodipine and Niaspan [niacin er]    Social History:  The patient  reports that she has never smoked. She has never used smokeless tobacco. She reports that she does not drink alcohol or use drugs.    Family History:  The patient's family history includes Diabetes in her brother; Heart failure in her father; Stroke in her mother.    ROS:  Please see the history of present illness. All other systems are reviewed and negative.    PHYSICAL EXAM: VS:  BP 134/60 (BP Location: Left Arm, Patient Position: Sitting, Cuff Size: Normal)   Pulse (!) 59   Ht 5\' 7"  (1.702 m)   Wt 154 lb 6 oz (70 kg)   BMI 24.18 kg/m  , BMI Body mass index is 24.18 kg/m. GEN: Well nourished, well developed, female in no acute distress  HEENT: normal for age  Neck: no JVD, no carotid bruit, no masses Cardiac: RRR; 2/6 murmur, no rubs, or gallops Respiratory:  clear to auscultation bilaterally, normal work of breathing GI: soft, nontender, nondistended, + BS Becky: no deformity or atrophy; no edema; distal pulses are 2+ in all 4 extremities   Skin: warm and dry, no rash Neuro:  Strength and sensation are intact Psych: euthymic mood, full affect   EKG:  EKG is ordered today. The ekg ordered today demonstrates sinus brady, HR 59, not acute changes  06/23/2016 ECG done for the procedure shows atrial fib, HR 98 (no sx)   Recent Labs: 05/12/2016: ALT 18; Hemoglobin 13.6; Platelets 224; TSH 3.053 05/17/2016: BUN 14; Creatinine, Ser 1.08; Potassium 4.5; Sodium 134    Lipid Panel    Component Value Date/Time   CHOL 168 05/13/2016 0547   CHOL 172 03/31/2016 0818   TRIG 107 05/13/2016 0547   HDL 46 05/13/2016 0547   HDL 60 03/31/2016 0818   CHOLHDL 3.7 05/13/2016 0547   VLDL 21 05/13/2016 0547   LDLCALC 101 (H) 05/13/2016 0547   LDLCALC 92 03/31/2016 0818     Wt Readings from Last 3 Encounters:  06/27/16 154 lb 6 oz (70 kg)  05/17/16 162 lb (73.5 kg)  05/12/16 156 lb 6.4 oz (70.9 kg)     Other studies Reviewed: Additional studies/ records that were reviewed today include: outside records, office notes.  ASSESSMENT AND PLAN:  1.  PAF: new diagnosis. Unknown frequency and duration since she is  asymptomatic. She has had problems increased DOE. May be due to afib. Will ck event monitor to determine HR range and afib prevalence. With resting bradycardia, cannot add BB/CCB. Follow for sx  2. Anticoagulation:  This patient's CHA2DS2-VASc Score  and unadjusted Ischemic Stroke Rate (% per year) is equal to 9.7 % stroke rate/year from a score of 6 Above score calculated as 1 point each if present [CHF, HTN, DM, Vascular=MI/PAD/Aortic Plaque, Age if 65-74, or Female], 2 points each if present [Age > 75, or Stroke/TIA/TE]. Pt started on Xarelto. CrCl calculated at 45.81 mL/min>>15 mg qd.  Current medicines are reviewed at length with the patient today.  The patient does not have concerns regarding medicines.  The following changes have been made:  Add Xarelto  Labs/ tests ordered today include:  No orders of the defined types were placed in this encounter.    Disposition:   FU with [name]  Signed, Becky Ferries, PA-C  06/27/2016 11:36 AM    [place] Phone: [phone]; Fax: [phone]  This note was written with the assistance of speech recognition software. Please excuse any transcriptional errors.

## 2016-06-27 NOTE — Patient Instructions (Addendum)
Medication Instructions:  START- Xarelto 15 mg once a day  Labwork: None Ordered  Testing/Procedures: Your physician has requested that you have an echocardiogram. Echocardiography is a painless test that uses sound waves to create images of your heart. It provides your doctor with information about the size and shape of your heart and how well your heart's chambers and valves are working. This procedure takes approximately one hour. There are no restrictions for this procedure.  Your physician has recommended that you wear an event monitor. Event monitors are medical devices that record the heart's electrical activity. Doctors most often [place] these monitors to diagnose arrhythmias. Arrhythmias are problems with the speed or rhythm of the heartbeat. The monitor is a small, portable device. You can wear one while you do your normal daily activities. This is usually used to diagnose what is causing palpitations/syncope (passing out).   Follow-Up: Your physician recommends that you schedule a follow-up appointment in: 6 Weeks   Any Other Special Instructions Will Be Listed Below (If Applicable).     If you need a refill on your cardiac medications before your next appointment, please call your pharmacy.

## 2016-06-27 NOTE — Telephone Encounter (Signed)
New message  Pharm has some questions about pt's medication  Please call back

## 2016-06-28 ENCOUNTER — Telehealth: Payer: Self-pay | Admitting: Physician Assistant

## 2016-06-28 NOTE — Telephone Encounter (Signed)
Spoke to daughter [name], [place]  Reviewed chart. Questions addressed. Aware that Xarelto was in addition to her current meds and she should not stop ASA. Did discuss bleed risks but that Xarelto and ASA work in different ways in the clotting cascade. Pt awaiting clearance from eye surgeon due to recent eye flap procedure. She is to go this Friday for a follow up and plans to start Xarelto then if OK'd by eye surgeon.  She also had questions regarding heart monitor which were answered. Aware to call if further needs or concerns.  Caller aware I will route to [facility] for verification of instruction & call back if recommendations differ.

## 2016-06-28 NOTE — Telephone Encounter (Signed)
Left msg to call regarding revision of instructions.

## 2016-06-28 NOTE — Telephone Encounter (Signed)
She should just start the Xarelto, sorry about the confusion. Thanks

## 2016-06-28 NOTE — Telephone Encounter (Signed)
Pt had eye surgery and went off baby ASA and Xarelto, she has a fu on Friday and will then be told when to start back, dtr [name] wants to know if she needs to start back on both or just the Xarelto?

## 2016-06-29 DIAGNOSIS — I4891 Unspecified atrial fibrillation: Secondary | ICD-10-CM | POA: Diagnosis not present

## 2016-06-29 NOTE — Telephone Encounter (Signed)
Instructions relayed to daughter, who voiced understanding and thanks.

## 2016-06-29 NOTE — Telephone Encounter (Signed)
Returning your call,if possible please call her before 9:00 this morning.

## 2016-07-04 ENCOUNTER — Encounter (INDEPENDENT_AMBULATORY_CARE_PROVIDER_SITE_OTHER): Payer: Self-pay | Admitting: *Deleted

## 2016-07-04 ENCOUNTER — Telehealth (INDEPENDENT_AMBULATORY_CARE_PROVIDER_SITE_OTHER): Payer: Self-pay | Admitting: *Deleted

## 2016-07-04 NOTE — Telephone Encounter (Signed)
Have contacted pharmacy, they are faxing a form.

## 2016-07-04 NOTE — Telephone Encounter (Signed)
Patient needs osmo pill prep 

## 2016-07-04 NOTE — Telephone Encounter (Signed)
Becky Baird ( [place] ) is calling about the prior auth forms that needs to be completed for her mother to get her Xarelto filled . The Insurance will not pay for the medication until these are completed . Please call

## 2016-07-04 NOTE — Telephone Encounter (Signed)
I have communicated w pharmacy and gotten patient a 30 day free supply of the medication using trial card. Spoke to daughter who is aware of this and pending PA, and voiced thanks.

## 2016-07-05 ENCOUNTER — Ambulatory Visit (HOSPITAL_COMMUNITY)
Admission: RE | Admit: 2016-07-05 | Discharge: 2016-07-05 | Disposition: A | Discharge status: Home / Self Care | Payer: Medicare Other | Source: Ambulatory Visit | Attending: Physician Assistant | Admitting: Physician Assistant

## 2016-07-05 DIAGNOSIS — I480 Paroxysmal atrial fibrillation: Secondary | ICD-10-CM

## 2016-07-05 MED ORDER — SOD PHOS MONO-SOD PHOS DIBASIC 1.102-0.398 G PO TABS: 32.0000 | ORAL_TABLET | Freq: Once | ORAL | 0 refills | Status: AC

## 2016-07-05 NOTE — Progress Notes (Signed)
*  PRELIMINARY RESULTS* Echocardiogram 2D Echocardiogram has been performed.  Leavy Cella 07/05/2016, 1:56 PM

## 2016-07-08 ENCOUNTER — Telehealth: Payer: Self-pay | Admitting: *Deleted

## 2016-07-08 NOTE — Telephone Encounter (Signed)
Xarelto 15mg  daily PA submitted 07/08/16  Case ID: [place] [id number] - [id number]

## 2016-07-15 ENCOUNTER — Telehealth: Payer: Self-pay | Admitting: Nurse Practitioner

## 2016-07-15 ENCOUNTER — Ambulatory Visit (INDEPENDENT_AMBULATORY_CARE_PROVIDER_SITE_OTHER): Payer: Medicare Other | Admitting: Nurse Practitioner

## 2016-07-15 VITALS — BP 132/74 | Wt 155.8 lb

## 2016-07-15 DIAGNOSIS — E2839 Other primary ovarian failure: Secondary | ICD-10-CM | POA: Diagnosis not present

## 2016-07-15 DIAGNOSIS — R300 Dysuria: Secondary | ICD-10-CM | POA: Diagnosis not present

## 2016-07-15 LAB — POCT URINALYSIS DIPSTICK
BILIRUBIN UA: NEGATIVE
Glucose, UA: NEGATIVE
Ketones, UA: NEGATIVE
LEUKOCYTES UA: NEGATIVE
NITRITE UA: POSITIVE
PH UA: 7
PROTEIN UA: NEGATIVE
RBC UA: NEGATIVE
Spec Grav, UA: <=1.005
UROBILINOGEN UA: 0.2

## 2016-07-15 MED ORDER — CEFDINIR 300 MG PO CAPS: 300.0000 mg | ORAL_CAPSULE | Freq: Two times a day (BID) | ORAL | 0 refills | Status: DC

## 2016-07-15 NOTE — Patient Instructions (Addendum)
Becky Baird  Take AZO for 48 hours as directed then stop   Atrophic Vaginitis Introduction Atrophic vaginitis is when the tissues that line the vagina become dry and thin. This is caused by a drop in estrogen. Estrogen helps:  To keep the vagina moist.  To make a clear fluid that helps:  To lubricate the vagina for sex.  To protect the vagina from infection. If the lining of the vagina is dry and thin, it may:  Make sex painful. It may also cause bleeding.  Cause a feeling of:  Burning.  Irritation.  Itchiness.  Make an exam of your vagina painful. It may also cause bleeding.  Make you lose interest in sex.  Cause a burning feeling when you pee.  Make your vaginal fluid (discharge) brown or yellow. For some women, there are no symptoms. This condition is most common in women who do not get their regular menstrual periods anymore (menopause). This often starts when a woman is 53-13 years old. Follow these instructions at home:  Take medicines only as told by your doctor. Do not use any herbal or alternative medicines unless your doctor says it is okay.  Use over-the-counter products for dryness only as told by your doctor. These include:  Creams.  Lubricants.  Moisturizers.  Do not douche.  Do not use products that can make your vagina dry. These include:  Scented feminine sprays.  Scented tampons.  Scented soaps.  If it hurts to have sex, tell your sexual partner. Contact a doctor if:  Your discharge looks different than normal.  Your vagina has an unusual smell.  You have new symptoms.  Your symptoms do not get better with treatment.  Your symptoms get worse. This information is not intended to replace advice given to you by your health care provider. Make sure you discuss any questions you have with your health care provider. Document Released: 12/28/2007 Document Revised: 12/17/2015 Document Reviewed: 07/02/2014  2017 Elsevier

## 2016-07-15 NOTE — Telephone Encounter (Signed)
When urine culture results come back next week, please call Dolan Amen at [phone].  This is the patients daughter.

## 2016-07-16 ENCOUNTER — Encounter: Payer: Self-pay | Admitting: Nurse Practitioner

## 2016-07-16 LAB — POCT UA - MICROSCOPIC ONLY
Bacteria, U Microscopic: NEGATIVE
RBC, urine, microscopic: NEGATIVE
WBC, Ur, HPF, POC: NEGATIVE

## 2016-07-16 NOTE — Progress Notes (Signed)
Subjective:  Presents for c/o dysuria mainly external with urgency and frequency that started last night. No fever. Daughter is present with her today. Has had some external vaginal irritation. No discharge. No vaginal bleeding. No N/V. Has had left low back pain for some time. Has had therapy for this. Taking fluids well. Applies clobetasol occasionally to GU area. Was hospitalized in October for UTI.   Objective:   BP 132/74   Wt 155 lb 12.8 oz (70.7 kg)   BMI 24.40 kg/m  NAD. Alert, oriented. Lungs clear. Heart RRR. Mild tenderness in the left mid back area but near the area of her chronic pain. No right CVA tenderness. Abdomen soft, non tender. External GU: pale, dry with small area of minimal erythema. No discharge. Bimanual no tenderness or obvious masses. Results for orders placed or performed in visit on 07/15/16  POCT urinalysis dipstick  Result Value Ref Range   Color, UA Straw    Clarity, UA Clear    Glucose, UA Neg    Bilirubin, UA Neg    Ketones, UA Neg    Spec Grav, UA <=1.005    Blood, UA Neg    pH, UA 7.0    Protein, UA Neg    Urobilinogen, UA 0.2    Nitrite, UA Positive    Leukocytes, UA Negative Negative  POCT UA - Microscopic Only  Result Value Ref Range   WBC, Ur, HPF, POC neg    RBC, urine, microscopic neg    Bacteria, U Microscopic neg    Mucus, UA     Epithelial cells, urine per micros     Crystals, Ur, HPF, POC     Casts, Ur, LPF, POC     Yeast, UA        Assessment: Dysuria - Plan: POCT urinalysis dipstick, CULTURE, URINE COMPREHENSIVE  Hypoestrogenism  Plan:  Meds ordered this encounter  Medications  . cefdinir (OMNICEF) 300 MG capsule    Sig: Take 1 capsule (300 mg total) by mouth 2 (two) times daily.    Dispense:  14 capsule    Refill:  0    **Please deliver; also please deliver AZO and Luvena (vaginal gel). Thanks.**    Order Specific Question:   Supervising Provider    Answer:   Mikey Kirschner [[id number]]   Because of pos nitrites and  urinary symptoms, will go ahead and cover with antibiotics. Urine culture pending. Warning signs reviewed. Seek help over weekend if worse. Use AZO for 48 hours then stop. Luvena or other vaginal moisturizers for vaginal irritation. Avoid use of topical steroids. Call back if worsens or persists. Further follow up based on culture.

## 2016-07-18 LAB — CULTURE, URINE COMPREHENSIVE

## 2016-07-19 NOTE — Telephone Encounter (Signed)
Discussed with pt's daughter

## 2016-07-19 NOTE — Telephone Encounter (Signed)
Please see report on urine culture.

## 2016-07-19 NOTE — Telephone Encounter (Signed)
Calling to check on urine culture results.  [name] (patients daughter) says she was told we would call her today.  She can be reached today at [phone].

## 2016-07-20 DIAGNOSIS — M9903 Segmental and somatic dysfunction of lumbar region: Secondary | ICD-10-CM | POA: Diagnosis not present

## 2016-07-20 DIAGNOSIS — M4126 Other idiopathic scoliosis, lumbar region: Secondary | ICD-10-CM | POA: Diagnosis not present

## 2016-07-20 DIAGNOSIS — M47816 Spondylosis without myelopathy or radiculopathy, lumbar region: Secondary | ICD-10-CM | POA: Diagnosis not present

## 2016-07-22 ENCOUNTER — Telehealth (INDEPENDENT_AMBULATORY_CARE_PROVIDER_SITE_OTHER): Payer: Self-pay | Admitting: *Deleted

## 2016-07-22 DIAGNOSIS — M4126 Other idiopathic scoliosis, lumbar region: Secondary | ICD-10-CM | POA: Diagnosis not present

## 2016-07-22 DIAGNOSIS — M47816 Spondylosis without myelopathy or radiculopathy, lumbar region: Secondary | ICD-10-CM | POA: Diagnosis not present

## 2016-07-22 DIAGNOSIS — M9903 Segmental and somatic dysfunction of lumbar region: Secondary | ICD-10-CM | POA: Diagnosis not present

## 2016-07-22 NOTE — Telephone Encounter (Signed)
Referring MD/PCP: scott luking   Procedure: tcs  Reason/Indication:  Hx polyps  Has patient had this procedure before?  Yes, 2012  If so, when, by whom and where?    Is there a family history of colon cancer?  no  Who?  What age when diagnosed?    Is patient diabetic?   no      Does patient have prosthetic heart valve or mechanical valve?  no  Do you have a pacemaker?  no  Has patient ever had endocarditis? no  Has patient had joint replacement within last 12 months?  no  Does patient tend to be constipated or take laxatives? no  Does patient have a history of alcohol/drug use?  no  Is patient on Coumadin, Plavix and/or Aspirin? yes  Medications: see epic  Allergies: see epic  Medication Adjustment: per Dr Laural Golden continue ASA  Procedure date & time: 08/24/16 at 31

## 2016-07-26 DIAGNOSIS — M9903 Segmental and somatic dysfunction of lumbar region: Secondary | ICD-10-CM | POA: Diagnosis not present

## 2016-07-26 DIAGNOSIS — M4126 Other idiopathic scoliosis, lumbar region: Secondary | ICD-10-CM | POA: Diagnosis not present

## 2016-07-26 DIAGNOSIS — M47816 Spondylosis without myelopathy or radiculopathy, lumbar region: Secondary | ICD-10-CM | POA: Diagnosis not present

## 2016-07-26 NOTE — Telephone Encounter (Signed)
agree

## 2016-07-28 DIAGNOSIS — M47816 Spondylosis without myelopathy or radiculopathy, lumbar region: Secondary | ICD-10-CM | POA: Diagnosis not present

## 2016-07-28 DIAGNOSIS — M9903 Segmental and somatic dysfunction of lumbar region: Secondary | ICD-10-CM | POA: Diagnosis not present

## 2016-07-28 DIAGNOSIS — M4126 Other idiopathic scoliosis, lumbar region: Secondary | ICD-10-CM | POA: Diagnosis not present

## 2016-08-02 DIAGNOSIS — M47816 Spondylosis without myelopathy or radiculopathy, lumbar region: Secondary | ICD-10-CM | POA: Diagnosis not present

## 2016-08-02 DIAGNOSIS — M9903 Segmental and somatic dysfunction of lumbar region: Secondary | ICD-10-CM | POA: Diagnosis not present

## 2016-08-02 DIAGNOSIS — M4126 Other idiopathic scoliosis, lumbar region: Secondary | ICD-10-CM | POA: Diagnosis not present

## 2016-08-05 DIAGNOSIS — M4126 Other idiopathic scoliosis, lumbar region: Secondary | ICD-10-CM | POA: Diagnosis not present

## 2016-08-05 DIAGNOSIS — M9903 Segmental and somatic dysfunction of lumbar region: Secondary | ICD-10-CM | POA: Diagnosis not present

## 2016-08-05 DIAGNOSIS — M47816 Spondylosis without myelopathy or radiculopathy, lumbar region: Secondary | ICD-10-CM | POA: Diagnosis not present

## 2016-08-09 ENCOUNTER — Encounter: Payer: Self-pay | Admitting: Physician Assistant

## 2016-08-09 ENCOUNTER — Ambulatory Visit (INDEPENDENT_AMBULATORY_CARE_PROVIDER_SITE_OTHER): Payer: Medicare Other | Admitting: Physician Assistant

## 2016-08-09 VITALS — BP 162/65 | HR 59 | Ht 67.0 in | Wt 156.6 lb

## 2016-08-09 DIAGNOSIS — I480 Paroxysmal atrial fibrillation: Secondary | ICD-10-CM | POA: Diagnosis not present

## 2016-08-09 DIAGNOSIS — I10 Essential (primary) hypertension: Secondary | ICD-10-CM

## 2016-08-09 DIAGNOSIS — I350 Nonrheumatic aortic (valve) stenosis: Secondary | ICD-10-CM | POA: Diagnosis not present

## 2016-08-09 DIAGNOSIS — Z7901 Long term (current) use of anticoagulants: Secondary | ICD-10-CM | POA: Diagnosis not present

## 2016-08-09 NOTE — Progress Notes (Signed)
Cardiology Office Note   Date:  08/09/2016   ID:  Becky, Baird [birth year], MRN [id number]  PCP:  Becky Lange, MD  Cardiologist:  Dr. Percival Spanish 2016  Lenoard Aden 06/27/2016  Chief Complaint  Patient presents with  . Follow-up    echo and event monior    History of Present Illness: Becky Baird is a 83 y.o. female with a history of of SEM, HTN, HLD, cerebrovascular disease, CVA w/ R-MCA stent 2010, EF nl w/ no WMA on echo 2015.   Office visit 12/04, after pt dx atrial fib 11/30, Xarelto added. CHA2DS2VASc=6 (age x 2, female, HTN, CVA x 2).  Becky Dunnings presents for follow up after her event monitor and echo.HER-2 daughters are with her again today.  She has not had any palpitations since she was seen in the office. She has not been dizzy or lightheaded. She has not had any lower extremity edema. She has not had any bleeding issues or melena. She is tolerating medications very well. She was worried about the echocardiogram in the event monitor, but has not had any problems.  She was anxious about the office visit today and what she would hear. She thinks that is why her blood pressure is elevated today as she states it is not normally this. She has not had any chest pain or shortness of breath. She is compliant with her medications.   Past Medical History:  Diagnosis Date  . Anxiety   . Hyperlipidemia   . Hypertension   . Memory loss   . Mild cognitive impairment, so stated   . Occlusion and stenosis of carotid artery with cerebral infarction    Bilateral less than 50% carotid stenosis.  . Persistent atrial fibrillation (Becky Baird) 06/23/2016  . Renal cyst   . Stroke ([place])    2010, RMCA with stent  . Toe fracture 11/2015   R toe  . Ulcer ([place]) 1970s    Past Surgical History:  Procedure Laterality Date  . brain stent    . COLONOSCOPY  03/10/2011   Procedure: COLONOSCOPY;  Surgeon: Rogene Houston, MD;  Location: [facility];  Service: Endoscopy;   Laterality: N/A;  . DILATION AND CURETTAGE OF UTERUS     years ago  . OVARIAN CYST REMOVAL     at least 10 years ago  . TONSILLECTOMY     childhood    Medication Sig  . ALPRAZolam (XANAX) 0.5 MG tablet TAKE 1/2 TABLET BY MOUTH EVERY 6 HOURS AS NEEDED FOR ANXIETY AND TAKE 1/2 TO 1 TABLET AT BEDTIME AS NEEDED FOR SLEEP  . carboxymethylcellulose (REFRESH PLUS) 0.5 % SOLN Place 1 drop into both eyes daily as needed (dry eyes).  . Cholecalciferol 2000 UNITS CAPS Take 2,000 Units by mouth daily.  Marland Kitchen CRANBERRY EXTRACT PO Take 1 tablet by mouth daily.  . fish oil-omega-3 fatty acids 1000 MG capsule Take 2 g by mouth 2 (two) times daily. Two twice a day   . hydrochlorothiazide (HYDRODIURIL) 25 MG tablet Take 25 mg by mouth daily.  . Iron-FA-B Cmp-C-Biot-Probiotic (FUSION PLUS) CAPS   . loratadine (CLARITIN) 10 MG tablet Take 1 tablet (10 mg total) by mouth daily.  . pantoprazole (PROTONIX) 40 MG tablet Take 1 tablet (40 mg total) by mouth daily.  . potassium chloride (K-DUR,KLOR-CON) 10 MEQ tablet 10 mEq 2 (two) times daily.  . Red Yeast Rice 600 MG CAPS Take 600 mg by mouth 2 (two) times daily.  Marland Kitchen  Rivaroxaban (XARELTO) 15 MG TABS tablet Take 1 tablet (15 mg total) by mouth daily with supper.  . sodium chloride (OCEAN) 0.65 % nasal spray Place 1 spray into the nose as needed for congestion.   No current facility-administered medications for this visit.     Allergies:   Amlodipine and Niaspan [niacin er]    Social History:  The patient  reports that she has never smoked. She has never used smokeless tobacco. She reports that she does not drink alcohol or use drugs.   Family History:  The patient's family history includes Diabetes in her brother; Heart failure in her father; Stroke in her mother.    ROS:  Please see the history of present illness. All other systems are reviewed and negative.    PHYSICAL EXAM: VS:  BP (!) 162/65   Pulse (!) 59   Ht '5\' 7"'  (1.702 m)   Wt 156 lb 9.6 oz (71  kg)   BMI 24.53 kg/m  , BMI Body mass index is 24.53 kg/m. GEN: Well nourished, well developed, female in no acute distress  HEENT: normal for age  Neck: no JVD, no carotid bruit, no masses Cardiac: RRR; 2/6 murmur, no rubs, or gallops Respiratory:  clear to auscultation bilaterally, normal work of breathing GI: soft, nontender, nondistended, + BS MS: no deformity or atrophy; no edema; distal pulses are 2+ in all 4 extremities   Skin: warm and dry, no rash Neuro:  Strength and sensation are intact Psych: euthymic mood, full affect   EKG:  EKG is not ordered today.  ECHO: 07/05/2016 - Left ventricle: The cavity size was normal. Wall thickness was   increased in a pattern of mild LVH. Systolic function was normal.   The estimated ejection fraction was in the range of 60% to 65%.   Wall motion was normal; there were no regional wall motion   abnormalities. Doppler parameters are consistent with abnormal   left ventricular relaxation (grade 1 diastolic dysfunction). LA   pressure is indeterminate. - Aortic valve: There was mild stenosis. There was mild   regurgitation. Valve area (VTI): 1.56 cm^2. Valve area (Vmax):   1.56 cm^2. Valve area (Vmean): 1.7 cm^2. - Mitral valve: Mildly calcified annulus. Mildly thickened leaflets  . - Technically adequate study.  EVENT MONITOR NSR, sinus bradycardia. No significant sustained arrhythmias.    Recent Labs: 05/12/2016: ALT 18; Hemoglobin 13.6; Platelets 224; TSH 3.053 05/17/2016: BUN 14; Creatinine, Ser 1.08; Potassium 4.5; Sodium 134    Lipid Panel    Component Value Date/Time   CHOL 168 05/13/2016 0547   CHOL 172 03/31/2016 0818   TRIG 107 05/13/2016 0547   HDL 46 05/13/2016 0547   HDL 60 03/31/2016 0818   CHOLHDL 3.7 05/13/2016 0547   VLDL 21 05/13/2016 0547   LDLCALC 101 (H) 05/13/2016 0547   LDLCALC 92 03/31/2016 0818     Wt Readings from Last 3 Encounters:  08/09/16 156 lb 9.6 oz (71 kg)  07/15/16 155 lb 12.8 oz  (70.7 kg)  06/27/16 154 lb 6 oz (70 kg)     Other studies Reviewed: Additional studies/ records that were reviewed today include: Office notes and testing.  ASSESSMENT AND PLAN:  1.  PAF: She did not have any on a recent event monitor. On echocardiogram, both her atria were normal in size. Her EF is also normal. She has a low resting heart rate, so is not a candidate for beta blocker or CCB. She is to let us know  about symptoms, otherwise just continue on the Xarelto.  2. Hypertension: Her blood pressure was significantly elevated on arrival. However, she admitted to some anxiety about the office visit today. Her blood pressure was rechecked prior to leaving and it was within normal limits. No med changes.  3. Aortic stenosis: Her ASA is mild by echocardiogram today. No further evaluation is needed at this time. This. VSs mild, we can probably before getting another echo.  4. Chronic anticoagulation: She needs to have a colonoscopy. She wonders if she can have the procedure and if it is okay for her to hold her Xarelto for the procedure. Her current protocol is to hold the Xarelto for 2 days before the procedure and restart after the procedure per the GI doctors recommendations. She was given this information.   Current medicines are reviewed at length with the patient today.  The patient has concerns regarding medicines. Concerns were addressed  The following changes have been made:  no change  Labs/ tests ordered today include:  No orders of the defined types were placed in this encounter.    Disposition:   FU with Dr. Percival Spanish  Signed, Lenoard Aden  08/09/2016 11:31 AM    [place] Phone: [phone]; Fax: [phone]  This note was written with the assistance of speech recognition software. Please excuse any transcriptional errors.

## 2016-08-09 NOTE — Patient Instructions (Signed)
You can [place] prior to colonoscopy  Monitor for symptoms of lightheadedness, dizziness, shortness of breath or fatigue (signs your heart rate could be low).   Your physician recommends that you schedule a follow-up appointment with Dr. Percival Spanish (if you prefer, he sees patient in [place])  [place] of [facility] is 120-68

## 2016-08-15 DIAGNOSIS — M9903 Segmental and somatic dysfunction of lumbar region: Secondary | ICD-10-CM | POA: Diagnosis not present

## 2016-08-15 DIAGNOSIS — M47816 Spondylosis without myelopathy or radiculopathy, lumbar region: Secondary | ICD-10-CM | POA: Diagnosis not present

## 2016-08-15 DIAGNOSIS — M4126 Other idiopathic scoliosis, lumbar region: Secondary | ICD-10-CM | POA: Diagnosis not present

## 2016-08-18 ENCOUNTER — Ambulatory Visit: Payer: Medicare Other | Admitting: Family Medicine

## 2016-08-18 DIAGNOSIS — M47816 Spondylosis without myelopathy or radiculopathy, lumbar region: Secondary | ICD-10-CM | POA: Diagnosis not present

## 2016-08-18 DIAGNOSIS — M4126 Other idiopathic scoliosis, lumbar region: Secondary | ICD-10-CM | POA: Diagnosis not present

## 2016-08-18 DIAGNOSIS — M9903 Segmental and somatic dysfunction of lumbar region: Secondary | ICD-10-CM | POA: Diagnosis not present

## 2016-08-22 ENCOUNTER — Other Ambulatory Visit: Payer: Self-pay | Admitting: Family Medicine

## 2016-08-22 NOTE — Telephone Encounter (Signed)
I am not confident the patient is on this. This needs to be verified with the patient before filling.

## 2016-08-23 NOTE — Telephone Encounter (Signed)
Patient's daughter called and stated that she is still taking this medication.

## 2016-08-24 ENCOUNTER — Encounter (HOSPITAL_COMMUNITY)
Admission: RE | Disposition: A | Discharge status: Home / Self Care | Payer: Self-pay | Source: Ambulatory Visit | Attending: Internal Medicine

## 2016-08-24 ENCOUNTER — Encounter (HOSPITAL_COMMUNITY): Payer: Self-pay | Admitting: *Deleted

## 2016-08-24 ENCOUNTER — Ambulatory Visit (HOSPITAL_COMMUNITY)
Admission: RE | Admit: 2016-08-24 | Discharge: 2016-08-24 | Disposition: A | Discharge status: Home / Self Care | Payer: Medicare Other | Source: Ambulatory Visit | Attending: Internal Medicine | Admitting: Internal Medicine

## 2016-08-24 DIAGNOSIS — Z79899 Other long term (current) drug therapy: Secondary | ICD-10-CM | POA: Insufficient documentation

## 2016-08-24 DIAGNOSIS — K644 Residual hemorrhoidal skin tags: Secondary | ICD-10-CM | POA: Diagnosis not present

## 2016-08-24 DIAGNOSIS — Z1211 Encounter for screening for malignant neoplasm of colon: Secondary | ICD-10-CM | POA: Insufficient documentation

## 2016-08-24 DIAGNOSIS — I481 Persistent atrial fibrillation: Secondary | ICD-10-CM | POA: Insufficient documentation

## 2016-08-24 DIAGNOSIS — D126 Benign neoplasm of colon, unspecified: Secondary | ICD-10-CM | POA: Diagnosis not present

## 2016-08-24 DIAGNOSIS — Z8673 Personal history of transient ischemic attack (TIA), and cerebral infarction without residual deficits: Secondary | ICD-10-CM | POA: Diagnosis not present

## 2016-08-24 DIAGNOSIS — K621 Rectal polyp: Secondary | ICD-10-CM | POA: Diagnosis not present

## 2016-08-24 DIAGNOSIS — D128 Benign neoplasm of rectum: Secondary | ICD-10-CM | POA: Diagnosis not present

## 2016-08-24 DIAGNOSIS — D123 Benign neoplasm of transverse colon: Secondary | ICD-10-CM | POA: Insufficient documentation

## 2016-08-24 DIAGNOSIS — D122 Benign neoplasm of ascending colon: Secondary | ICD-10-CM | POA: Insufficient documentation

## 2016-08-24 DIAGNOSIS — K5730 Diverticulosis of large intestine without perforation or abscess without bleeding: Secondary | ICD-10-CM | POA: Diagnosis not present

## 2016-08-24 DIAGNOSIS — Z860100 Personal history of colon polyps, unspecified: Secondary | ICD-10-CM | POA: Insufficient documentation

## 2016-08-24 DIAGNOSIS — F419 Anxiety disorder, unspecified: Secondary | ICD-10-CM | POA: Insufficient documentation

## 2016-08-24 DIAGNOSIS — E785 Hyperlipidemia, unspecified: Secondary | ICD-10-CM | POA: Diagnosis not present

## 2016-08-24 DIAGNOSIS — Z09 Encounter for follow-up examination after completed treatment for conditions other than malignant neoplasm: Secondary | ICD-10-CM | POA: Diagnosis not present

## 2016-08-24 HISTORY — PX: POLYPECTOMY: SHX5525

## 2016-08-24 HISTORY — PX: COLONOSCOPY: SHX5424

## 2016-08-24 SURGERY — COLONOSCOPY
Anesthesia: Moderate Sedation

## 2016-08-24 MED ORDER — MEPERIDINE HCL 50 MG/ML IJ SOLN
INTRAMUSCULAR | Status: AC
  Filled 2016-08-24: qty 1

## 2016-08-24 MED ORDER — SIMETHICONE 40 MG/0.6ML PO SUSP
ORAL | Status: DC | PRN
  Administered 2016-08-24: 2.5 mL

## 2016-08-24 MED ORDER — MIDAZOLAM HCL 5 MG/5ML IJ SOLN
INTRAMUSCULAR | Status: DC | PRN
  Administered 2016-08-24 (×2): 1 mg via INTRAVENOUS

## 2016-08-24 MED ORDER — MIDAZOLAM HCL 5 MG/5ML IJ SOLN
INTRAMUSCULAR | Status: AC
  Filled 2016-08-24: qty 10

## 2016-08-24 MED ORDER — SODIUM CHLORIDE 0.9 % IV SOLN
INTRAVENOUS | Status: DC
  Administered 2016-08-24: 1000 mL via INTRAVENOUS

## 2016-08-24 MED ORDER — MEPERIDINE HCL 50 MG/ML IJ SOLN
INTRAMUSCULAR | Status: DC | PRN
  Administered 2016-08-24: 25 mg via INTRAVENOUS

## 2016-08-24 NOTE — H&P (Signed)
Becky Baird is an 83 y.o. female.   Chief Complaint: Patient is here for colonoscopy. HPI: Patient is 83 year old Caucasian female is had multiple colonic polyps and is here for surveillance colonoscopy. Last colonoscopy was in August 2012 with removal of 2 polyps. One polyp was tubular adenoma and the other polyp was sessile serrated polyp. She denies abdominal pain change in bowel habits or rectal bleeding. Patient has been off anticoagulant for 2 days. Family History is negative for CRC.  Past Medical History:  Diagnosis Date  . Anxiety   . Hyperlipidemia   . Hypertension   . Memory loss   . Mild cognitive impairment, so stated   . Occlusion and stenosis of carotid artery with cerebral infarction    Bilateral less than 50% carotid stenosis.  . Persistent atrial fibrillation ([place]) 06/23/2016      . Stroke ([place])    2010, RMCA with stent  .  11/2015     .  1970s    Past Surgical History:  Procedure Laterality Date  . brain stent    . COLONOSCOPY  03/10/2011   Procedure: COLONOSCOPY;  Surgeon: Rogene Houston, MD;  Location: [facility];  Service: Endoscopy;  Laterality: N/A;  . DILATION AND CURETTAGE OF UTERUS     years ago  . OVARIAN CYST REMOVAL     at least 10 years ago  . TONSILLECTOMY     childhood    Family History  Problem Relation Age of Onset  . Stroke Mother   . Heart failure Father   . Diabetes Brother    Social History:  reports that she has never smoked. She has never used smokeless tobacco. She reports that she does not drink alcohol or use drugs.  Allergies:  Allergies  Allergen Reactions  . Amlodipine     Pedal edema  . Niaspan [Niacin Er]     rash    Medications Prior to Admission  Medication Sig Dispense Refill  . ALPRAZolam (XANAX) 0.5 MG tablet TAKE 1/2 TABLET BY MOUTH EVERY 6 HOURS AS NEEDED FOR ANXIETY AND TAKE 1/2 TO 1 TABLET AT BEDTIME AS NEEDED FOR SLEEP 45 tablet 3  . fish oil-omega-3 fatty acids 1000 MG capsule Take 2 g by mouth  2 (two) times daily. Two twice a day     . hydrochlorothiazide (HYDRODIURIL) 25 MG tablet TAKE ONE TABLET BY MOUTH DAILY 30 tablet 12  . loratadine (CLARITIN) 10 MG tablet Take 1 tablet (10 mg total) by mouth daily. 30 tablet 5  . pantoprazole (PROTONIX) 40 MG tablet Take 1 tablet (40 mg total) by mouth daily. 30 tablet 5  . potassium chloride (K-DUR,KLOR-CON) 10 MEQ tablet 10 mEq 2 (two) times daily.    . Red Yeast Rice 600 MG CAPS Take 600 mg by mouth 2 (two) times daily.    . sodium chloride (OCEAN) 0.65 % nasal spray Place 1 spray into the nose as needed for congestion.    . carboxymethylcellulose (REFRESH PLUS) 0.5 % SOLN Place 1 drop into both eyes daily as needed (dry eyes).    . Cholecalciferol 2000 UNITS CAPS Take 2,000 Units by mouth daily.    Marland Kitchen CRANBERRY EXTRACT PO Take 1 tablet by mouth daily.    . Iron-FA-B Cmp-C-Biot-Probiotic (FUSION PLUS) CAPS   3  . Rivaroxaban (XARELTO) 15 MG TABS tablet Take 1 tablet (15 mg total) by mouth daily with supper. 30 tablet 11    No results found for this or any previous visit (  from the past 48 hour(s)). No results found.  ROS  Blood pressure (!) 162/56, pulse 69, temperature 97.6 F (36.4 C), temperature source Oral, resp. rate 19, height 5' 7.5" (1.715 m), weight 157 lb (71.2 kg), SpO2 96 %. Physical Exam  Constitutional: She appears well-developed and well-nourished.  HENT:  Mouth/Throat: Oropharynx is clear and moist.  Eyes: Conjunctivae are normal.  Neck: No thyromegaly present.  Cardiovascular: Normal rate and regular rhythm.   Murmur (grade 2/6 SEM at left sternal border.) heard. Respiratory: Effort normal and breath sounds normal.  GI: Soft. She exhibits no distension and no mass. There is no tenderness.  Lymphadenopathy:    She has no cervical adenopathy.     Assessment/Plan  History of multiple colonic adenomas. Surveillance colonoscopy.Hildred Laser, MD 08/24/2016, 9:21 AM

## 2016-08-24 NOTE — Op Note (Signed)
[facility] Patient Name: Becky Baird Procedure Date: 08/24/2016 9:08 AM MRN: [id number] Date of Birth: 03/26/1934 Attending MD: Hildred Laser , MD CSN: [id number] Age: 83 Admit Type: Outpatient Procedure:                Colonoscopy Indications:              High risk colon cancer surveillance: Personal                            history of colonic polyps Providers:                Hildred Laser, MD, Lurline Del, RN, Isabella Stalling,                            Technician Referring MD:             Elayne Snare. Wolfgang Phoenix, MD Medicines:                Meperidine 25 mg IV, Midazolam 2 mg IV Complications:            No immediate complications. Estimated Blood Loss:     Estimated blood loss was minimal. Procedure:                Pre-Anesthesia Assessment:                           - Prior to the procedure, a History and Physical                            was performed, and patient medications and                            allergies were reviewed. The patient's tolerance of                            previous anesthesia was also reviewed. The risks                            and benefits of the procedure and the sedation                            options and risks were discussed with the patient.                            All questions were answered, and informed consent                            was obtained. Prior Anticoagulants: The patient                            last took Xarelto (rivaroxaban) 2 days prior to the                            procedure. ASA Grade Assessment: III - A patient  with severe systemic disease. After reviewing the                            risks and benefits, the patient was deemed in                            satisfactory condition to undergo the procedure.                           After obtaining informed consent, the colonoscope                            was passed under direct vision. Throughout the      procedure, the patient's blood pressure, pulse, and                            oxygen saturations were monitored continuously. The                            [id number]) was introduced through the                            anus and advanced to the the cecum, identified by                            appendiceal orifice and ileocecal valve. The                            colonoscopy was performed without difficulty. The                            patient tolerated the procedure well. The quality                            of the bowel preparation was excellent. The                            ileocecal valve, appendiceal orifice, and rectum                            were photographed. Scope In: 9:31:51 AM Scope Out: 9:53:14 AM Scope Withdrawal Time: 0 hours 11 minutes 59 seconds  Total Procedure Duration: 0 hours 21 minutes 23 seconds  Findings:      The perianal and digital rectal examinations were normal.      A small polyp was found in the ascending colon. The polyp was sessile.       Biopsies were taken with a cold forceps for histology. The pathology       specimen was placed into Bottle Number 1.      Three sessile polyps were found in the rectum, transverse colon and       hepatic flexure. The polyps were small in size. These polyps were       removed with a cold snare. Resection and retrieval were complete. The       pathology specimen was  placed into Bottle Number 1.      Scattered medium-mouthed diverticula were found in the sigmoid colon.      External hemorrhoids were found during retroflexion. The hemorrhoids       were small. Impression:               - One small polyp in the ascending colon. Biopsied.                           - Three small polyps in the rectum, in the                            transverse colon and at the hepatic flexure,                            removed with a cold snare. Resected and retrieved.                           - Diverticulosis in  the sigmoid colon.                           - External hemorrhoids. Moderate Sedation:      Moderate (conscious) sedation was administered by the endoscopy nurse       and supervised by the endoscopist. The following parameters were       monitored: oxygen saturation, heart rate, blood pressure, CO2       capnography and response to care. Total physician intraservice time was       26 minutes. Recommendation:           - Patient has a contact number available for                            emergencies. The signs and symptoms of potential                            delayed complications were discussed with the                            patient. Return to normal activities tomorrow.                            Written discharge instructions were provided to the                            patient.                           - High fiber diet today.                           - Continue present medications.                           - Resume Xarelto (rivaroxaban) at prior dose  tomorrow.                           - Await pathology results.                           - No recommendation at this time regarding repeat                            colonoscopy. Procedure Code(s):        --- Professional ---                           [phone], Colonoscopy, flexible; with removal of                            tumor(s), polyp(s), or other lesion(s) by snare                            technique                           45380, 59, Colonoscopy, flexible; with biopsy,                            single or multiple                           99152, Moderate sedation services provided by the                            same physician or other qualified health care                            professional performing the diagnostic or                            therapeutic service that the sedation supports,                            requiring the presence of an independent trained                             observer to assist in the monitoring of the                            patient's level of consciousness and physiological                            status; initial 15 minutes of intraservice time,                            patient age 59 years or older                           [phone], Moderate sedation services; each additional  15 minutes intraservice time Diagnosis Code(s):        --- Professional ---                           Z86.010, Personal history of colonic polyps                           D12.2, Benign neoplasm of ascending colon                           K62.1, Rectal polyp                           D12.3, Benign neoplasm of transverse colon (hepatic                            flexure or splenic flexure)                           K64.4, Residual hemorrhoidal skin tags                           K57.30, Diverticulosis of large intestine without                            perforation or abscess without bleeding CPT copyright 2016 American Medical Association. All rights reserved. The codes documented in this report are preliminary and upon coder review may  be revised to meet current compliance requirements. Hildred Laser, MD Hildred Laser, MD 08/24/2016 10:01:00 AM This report has been signed electronically. Number of Addenda: 0

## 2016-08-24 NOTE — Discharge Instructions (Signed)
Resume Xarelto on 08/25/2016. Resume other medications as before. High fiber diet. No driving for 24 hours. Physician will call with biopsy results.    Colonoscopy, Adult, Care After This sheet gives you information about how to care for yourself after your procedure. Your doctor may also give you more specific instructions. If you have problems or questions, call your doctor. Follow these instructions at home: General instructions  For the first 24 hours after the procedure:  Do not drive or use machinery.  Do not sign important documents.  Do not drink alcohol.  Do your daily activities more slowly than normal.  Eat foods that are soft and easy to digest.  Rest often.  Take over-the-counter or prescription medicines only as told by your doctor.  It is up to you to get the results of your procedure. Ask your doctor, or the department performing the procedure, when your results will be ready. To help cramping and bloating:  Try walking around.  Put heat on your belly (abdomen) as told by your doctor. Use a heat source that your doctor recommends, such as a moist heat pack or a heating pad.  Put a towel between your skin and the heat source.  Leave the heat on for 20-30 minutes.  Remove the heat if your skin turns bright red. This is especially important if you cannot feel pain, heat, or cold. You can get burned. Eating and drinking  Drink enough fluid to keep your pee (urine) clear or pale yellow.  Return to your normal diet as told by your doctor. Avoid heavy or fried foods that are hard to digest.  Avoid drinking alcohol for as long as told by your doctor. Contact a doctor if:  You have blood in your poop (stool) 2-3 days after the procedure. Get help right away if:  You have more than a small amount of blood in your poop.  You see large clumps of tissue (blood clots) in your poop.  Your belly is swollen.  You feel sick to your stomach (nauseous).  You  throw up (vomit).  You have a fever.  You have belly pain that gets worse, and medicine does not help your pain. This information is not intended to replace advice given to you by your health care provider. Make sure you discuss any questions you have with your health care provider. Document Released: 08/13/2010 Document Revised: 04/04/2016 Document Reviewed: 04/04/2016 Elsevier Interactive Patient Education  2017 [place].    Colon Polyps Introduction Polyps are tissue growths inside the body. Polyps can grow in many places, including the large intestine (colon). A polyp may be a round bump or a mushroom-shaped growth. You could have one polyp or several. Most colon polyps are noncancerous (benign). However, some colon polyps can become cancerous over time. What are the causes? The exact cause of colon polyps is not known. What increases the risk? This condition is more likely to develop in people who:  Have a family history of colon cancer or colon polyps.  Are older than 46 or older than 45 if they are African American.  Have inflammatory bowel disease, such as ulcerative colitis or Crohn disease.  Are overweight.  Smoke cigarettes.  Do not get enough exercise.  Drink too much alcohol.  Eat a diet that is:  High in fat and red meat.  Low in fiber.  Had childhood cancer that was treated with abdominal radiation. What are the signs or symptoms? Most polyps do not cause symptoms.  If you have symptoms, they may include:  Blood coming from your rectum when having a bowel movement.  Blood in your stool.The stool may look dark red or black.  A change in bowel habits, such as constipation or diarrhea. How is this diagnosed? This condition is diagnosed with a colonoscopy. This is a procedure that uses a lighted, flexible scope to look at the inside of your colon. How is this treated? Treatment for this condition involves removing any polyps that are found. Those  polyps will then be tested for cancer. If cancer is found, your health care provider will talk to you about options for colon cancer treatment. Follow these instructions at home: Diet  Eat plenty of fiber, such as fruits, vegetables, and whole grains.  Eat foods that are high in calcium and vitamin D, such as milk, cheese, yogurt, eggs, liver, fish, and broccoli.  Limit foods high in fat, red meats, and processed meats, such as hot dogs, sausage, bacon, and lunch meats.  Maintain a healthy weight, or lose weight if recommended by your health care provider. General instructions  Do not smoke cigarettes.  Do not drink alcohol excessively.  Keep all follow-up visits as told by your health care provider. This is important. This includes keeping regularly scheduled colonoscopies. Talk to your health care provider about when you need a colonoscopy.  Exercise every day or as told by your health care provider. Contact a health care provider if:  You have new or worsening bleeding during a bowel movement.  You have new or increased blood in your stool.  You have a change in bowel habits.  You unexpectedly lose weight. This information is not intended to replace advice given to you by your health care provider. Make sure you discuss any questions you have with your health care provider. Document Released: 04/06/2004 Document Revised: 12/17/2015 Document Reviewed: 06/01/2015  2017 Elsevier   High-Fiber Diet Fiber, also called dietary fiber, is a type of carbohydrate found in fruits, vegetables, whole grains, and beans. A high-fiber diet can have many health benefits. Your health care provider may recommend a high-fiber diet to help:  Prevent constipation. Fiber can make your bowel movements more regular.  Lower your cholesterol.  Relieve hemorrhoids, uncomplicated diverticulosis, or irritable bowel syndrome.  Prevent overeating as part of a weight-loss plan.  Prevent heart disease,  type 2 diabetes, and certain cancers. What is my plan? The recommended daily intake of fiber includes:  38 grams for men under age 71.  85 grams for men over age 6.  62 grams for women under age 46.  76 grams for women over age 3. You can get the recommended daily intake of dietary fiber by eating a variety of fruits, vegetables, grains, and beans. Your health care provider may also recommend a fiber supplement if it is not possible to get enough fiber through your diet. What do I need to know about a high-fiber diet?  Fiber supplements have not been widely studied for their effectiveness, so it is better to get fiber through food sources.  Always check the fiber content on thenutrition facts label of any prepackaged food. Look for foods that contain at least 5 grams of fiber per serving.  Ask your dietitian if you have questions about specific foods that are related to your condition, especially if those foods are not listed in the following section.  Increase your daily fiber consumption gradually. Increasing your intake of dietary fiber too quickly may cause bloating, cramping,  or gas.  Drink plenty of water. Water helps you to digest fiber. What foods can I eat? Grains  Whole-grain breads. Multigrain cereal. Oats and oatmeal. Brown rice. Barley. Bulgur wheat. [place]. Bran muffins. Popcorn. Rye wafer crackers. Vegetables  Sweet potatoes. Spinach. Kale. Artichokes. Cabbage. Broccoli. Green peas. Carrots. Squash. Fruits  Berries. Pears. Apples. Oranges. Avocados. Prunes and raisins. Dried figs. Meats and Other Protein Sources  [place], kidney, pinto, and soy beans. Split peas. Lentils. Nuts and seeds. Dairy  Fiber-fortified yogurt. Beverages  Fiber-fortified soy milk. Fiber-fortified orange juice. Other  Fiber bars. The items listed above may not be a complete list of recommended foods or beverages. Contact your dietitian for more options.  What foods are not  recommended? Grains  White bread. Pasta made with refined flour. White rice. Vegetables  Fried potatoes. Canned vegetables. Well-cooked vegetables. Fruits  Fruit juice. Cooked, strained fruit. Meats and Other Protein Sources  Fatty cuts of meat. Fried Sales executive or fried fish. Dairy  Milk. Yogurt. Cream cheese. Sour cream. Beverages  Soft drinks. Other  Cakes and pastries. Butter and oils. The items listed above may not be a complete list of foods and beverages to avoid. Contact your dietitian for more information.  What are some tips for including high-fiber foods in my diet?  Eat a wide variety of high-fiber foods.  Make sure that half of all grains consumed each day are whole grains.  Replace breads and cereals made from refined flour or white flour with whole-grain breads and cereals.  Replace white rice with brown rice, bulgur wheat, or millet.  Start the day with a breakfast that is high in fiber, such as a cereal that contains at least 5 grams of fiber per serving.  Use beans in place of meat in soups, salads, or pasta.  Eat high-fiber snacks, such as berries, raw vegetables, nuts, or popcorn. This information is not intended to replace advice given to you by your health care provider. Make sure you discuss any questions you have with your health care provider. Document Released: 07/11/2005 Document Revised: 12/17/2015 Document Reviewed: 12/24/2013 Elsevier Interactive Patient Education  2017 [employer].

## 2016-08-26 DIAGNOSIS — M9903 Segmental and somatic dysfunction of lumbar region: Secondary | ICD-10-CM | POA: Diagnosis not present

## 2016-08-26 DIAGNOSIS — M47816 Spondylosis without myelopathy or radiculopathy, lumbar region: Secondary | ICD-10-CM | POA: Diagnosis not present

## 2016-08-26 DIAGNOSIS — M4126 Other idiopathic scoliosis, lumbar region: Secondary | ICD-10-CM | POA: Diagnosis not present

## 2016-08-31 DIAGNOSIS — M4126 Other idiopathic scoliosis, lumbar region: Secondary | ICD-10-CM | POA: Diagnosis not present

## 2016-08-31 DIAGNOSIS — M47816 Spondylosis without myelopathy or radiculopathy, lumbar region: Secondary | ICD-10-CM | POA: Diagnosis not present

## 2016-08-31 DIAGNOSIS — M9903 Segmental and somatic dysfunction of lumbar region: Secondary | ICD-10-CM | POA: Diagnosis not present

## 2016-09-02 ENCOUNTER — Encounter: Payer: Self-pay | Admitting: Family Medicine

## 2016-09-02 ENCOUNTER — Ambulatory Visit (INDEPENDENT_AMBULATORY_CARE_PROVIDER_SITE_OTHER): Payer: Medicare Other | Admitting: Family Medicine

## 2016-09-02 ENCOUNTER — Ambulatory Visit (HOSPITAL_COMMUNITY)
Admission: RE | Admit: 2016-09-02 | Discharge: 2016-09-02 | Disposition: A | Discharge status: Home / Self Care | Payer: Medicare Other | Source: Ambulatory Visit | Attending: Family Medicine | Admitting: Family Medicine

## 2016-09-02 VITALS — BP 112/76 | Ht 67.5 in | Wt 155.5 lb

## 2016-09-02 DIAGNOSIS — R350 Frequency of micturition: Secondary | ICD-10-CM

## 2016-09-02 DIAGNOSIS — I10 Essential (primary) hypertension: Secondary | ICD-10-CM

## 2016-09-02 DIAGNOSIS — E7849 Other hyperlipidemia: Secondary | ICD-10-CM

## 2016-09-02 DIAGNOSIS — R5383 Other fatigue: Secondary | ICD-10-CM

## 2016-09-02 DIAGNOSIS — M19041 Primary osteoarthritis, right hand: Secondary | ICD-10-CM | POA: Insufficient documentation

## 2016-09-02 DIAGNOSIS — M25541 Pain in joints of right hand: Secondary | ICD-10-CM

## 2016-09-02 DIAGNOSIS — R7303 Prediabetes: Secondary | ICD-10-CM

## 2016-09-02 DIAGNOSIS — E784 Other hyperlipidemia: Secondary | ICD-10-CM | POA: Diagnosis not present

## 2016-09-02 LAB — POCT URINALYSIS DIPSTICK
PH UA: 7
Spec Grav, UA: <=1.005

## 2016-09-02 MED ORDER — PANTOPRAZOLE SODIUM 40 MG PO TBEC: 40.0000 mg | DELAYED_RELEASE_TABLET | Freq: Every day | ORAL | 5 refills | Status: DC

## 2016-09-02 NOTE — Progress Notes (Signed)
   Subjective:    Patient ID: Becky Baird, female    DOB: 08-07-1933, 83 y.o.   MRN: [id number]  Hypertension  This is a chronic problem. The current episode started in the past 7 days. The problem is unchanged. There are no associated agents to hypertension. There are no known risk factors for coronary artery disease. Treatments tried: hctz. The current treatment provides moderate improvement. There are no compliance problems.    Patient is concerned about a possible uti. Urinary frequency also.   The patient states she has been seen a chiropractor who told her that she has rheumatoid arthritis she start having more difficulty closing her hands and has inflammation and soreness in the hands. She states her reflux under good control denies any problems with it Taking the blood thinner for atrial fibrillation and not having any bleeding issues with it Taking her blood pressure medicine as directed avoidance excessive salt Is able to do some physical activity but not a lot Is taking her red rice yeast extract patient does not tolerate statins  Review of Systems Relates arthralgias denies chest pressure tightness pain shortness of breath denies dizziness. Denies dysuria vomiting diarrhea sweats chills does relate moderate fatigue    Objective:   Physical Exam  Her lungs are clear heart regular at the moment extremities no edema skin warm dry neurologic grossly normal osteoarthritis noted in the hands worse on the right than the left decreased range of motion because of the osteoarthritis      Assessment & Plan:  Urinary frequency-we will go ahead and do a culture.  Hyperlipidemia continue red rice yeast extract previous labs reviewed check new labs  Osteoarthritis right hand x-ray ordered will do some lab work her chiropractor thought she had rheumatoid arthritis it does appear a possibility but it's also possible could be severe osteoarthritis  Hypertension under good control  continue current measures  Atrial fibrillation continue anticoagulant check CBC  25 minutes was spent with the patient. Greater than half the time was spent in discussion and answering questions and counseling regarding the issues that the patient came in for today.

## 2016-09-04 LAB — URINE CULTURE: ORGANISM ID, BACTERIA: NO GROWTH

## 2016-09-05 ENCOUNTER — Other Ambulatory Visit: Payer: Self-pay | Admitting: Family Medicine

## 2016-09-05 DIAGNOSIS — M9903 Segmental and somatic dysfunction of lumbar region: Secondary | ICD-10-CM | POA: Diagnosis not present

## 2016-09-05 DIAGNOSIS — M47816 Spondylosis without myelopathy or radiculopathy, lumbar region: Secondary | ICD-10-CM | POA: Diagnosis not present

## 2016-09-05 DIAGNOSIS — M4126 Other idiopathic scoliosis, lumbar region: Secondary | ICD-10-CM | POA: Diagnosis not present

## 2016-09-05 NOTE — Telephone Encounter (Signed)
Surprisingly patient stated that she did not need refill on this but apparently she does may have this +3 additional refills

## 2016-09-07 ENCOUNTER — Encounter (HOSPITAL_COMMUNITY): Payer: Self-pay | Admitting: Internal Medicine

## 2016-09-07 DIAGNOSIS — M47816 Spondylosis without myelopathy or radiculopathy, lumbar region: Secondary | ICD-10-CM | POA: Diagnosis not present

## 2016-09-07 DIAGNOSIS — M4126 Other idiopathic scoliosis, lumbar region: Secondary | ICD-10-CM | POA: Diagnosis not present

## 2016-09-07 DIAGNOSIS — M9903 Segmental and somatic dysfunction of lumbar region: Secondary | ICD-10-CM | POA: Diagnosis not present

## 2016-09-12 DIAGNOSIS — M47816 Spondylosis without myelopathy or radiculopathy, lumbar region: Secondary | ICD-10-CM | POA: Diagnosis not present

## 2016-09-12 DIAGNOSIS — M4126 Other idiopathic scoliosis, lumbar region: Secondary | ICD-10-CM | POA: Diagnosis not present

## 2016-09-12 DIAGNOSIS — M9903 Segmental and somatic dysfunction of lumbar region: Secondary | ICD-10-CM | POA: Diagnosis not present

## 2016-09-19 DIAGNOSIS — M9903 Segmental and somatic dysfunction of lumbar region: Secondary | ICD-10-CM | POA: Diagnosis not present

## 2016-09-19 DIAGNOSIS — M47816 Spondylosis without myelopathy or radiculopathy, lumbar region: Secondary | ICD-10-CM | POA: Diagnosis not present

## 2016-09-19 DIAGNOSIS — M4126 Other idiopathic scoliosis, lumbar region: Secondary | ICD-10-CM | POA: Diagnosis not present

## 2016-09-26 DIAGNOSIS — M4126 Other idiopathic scoliosis, lumbar region: Secondary | ICD-10-CM | POA: Diagnosis not present

## 2016-09-26 DIAGNOSIS — M47816 Spondylosis without myelopathy or radiculopathy, lumbar region: Secondary | ICD-10-CM | POA: Diagnosis not present

## 2016-09-26 DIAGNOSIS — M9903 Segmental and somatic dysfunction of lumbar region: Secondary | ICD-10-CM | POA: Diagnosis not present

## 2016-10-03 ENCOUNTER — Other Ambulatory Visit: Payer: Self-pay | Admitting: Family Medicine

## 2016-10-03 DIAGNOSIS — M9903 Segmental and somatic dysfunction of lumbar region: Secondary | ICD-10-CM | POA: Diagnosis not present

## 2016-10-03 DIAGNOSIS — M4126 Other idiopathic scoliosis, lumbar region: Secondary | ICD-10-CM | POA: Diagnosis not present

## 2016-10-03 DIAGNOSIS — M47816 Spondylosis without myelopathy or radiculopathy, lumbar region: Secondary | ICD-10-CM | POA: Diagnosis not present

## 2016-10-10 DIAGNOSIS — M47816 Spondylosis without myelopathy or radiculopathy, lumbar region: Secondary | ICD-10-CM | POA: Diagnosis not present

## 2016-10-10 DIAGNOSIS — M9903 Segmental and somatic dysfunction of lumbar region: Secondary | ICD-10-CM | POA: Diagnosis not present

## 2016-10-10 DIAGNOSIS — M4126 Other idiopathic scoliosis, lumbar region: Secondary | ICD-10-CM | POA: Diagnosis not present

## 2016-10-26 DIAGNOSIS — M4126 Other idiopathic scoliosis, lumbar region: Secondary | ICD-10-CM | POA: Diagnosis not present

## 2016-10-26 DIAGNOSIS — M47816 Spondylosis without myelopathy or radiculopathy, lumbar region: Secondary | ICD-10-CM | POA: Diagnosis not present

## 2016-10-26 DIAGNOSIS — M9903 Segmental and somatic dysfunction of lumbar region: Secondary | ICD-10-CM | POA: Diagnosis not present

## 2016-10-31 DIAGNOSIS — R5383 Other fatigue: Secondary | ICD-10-CM | POA: Diagnosis not present

## 2016-10-31 DIAGNOSIS — M25541 Pain in joints of right hand: Secondary | ICD-10-CM | POA: Diagnosis not present

## 2016-10-31 DIAGNOSIS — E889 Metabolic disorder, unspecified: Secondary | ICD-10-CM | POA: Diagnosis not present

## 2016-10-31 DIAGNOSIS — N183 Chronic kidney disease, stage 3 (moderate): Secondary | ICD-10-CM | POA: Diagnosis not present

## 2016-10-31 DIAGNOSIS — E784 Other hyperlipidemia: Secondary | ICD-10-CM | POA: Diagnosis not present

## 2016-10-31 DIAGNOSIS — I10 Essential (primary) hypertension: Secondary | ICD-10-CM | POA: Diagnosis not present

## 2016-10-31 DIAGNOSIS — N189 Chronic kidney disease, unspecified: Secondary | ICD-10-CM | POA: Diagnosis not present

## 2016-10-31 DIAGNOSIS — R7303 Prediabetes: Secondary | ICD-10-CM | POA: Diagnosis not present

## 2016-11-01 ENCOUNTER — Encounter: Payer: Self-pay | Admitting: Family Medicine

## 2016-11-01 LAB — SEDIMENTATION RATE: Sed Rate: 4 mm/hr (ref 0–40)

## 2016-11-01 LAB — CBC WITH DIFFERENTIAL/PLATELET
Basophils Absolute: 0 10*3/uL (ref 0.0–0.2)
Basos: 1 %
EOS (ABSOLUTE): 0.1 10*3/uL (ref 0.0–0.4)
EOS: 3 %
HEMATOCRIT: 42.6 % (ref 34.0–46.6)
HEMOGLOBIN: 14.3 g/dL (ref 11.1–15.9)
IMMATURE GRANULOCYTES: 0 %
Immature Grans (Abs): 0 10*3/uL (ref 0.0–0.1)
Lymphocytes Absolute: 1.8 10*3/uL (ref 0.7–3.1)
Lymphs: 33 %
MCH: 30.8 pg (ref 26.6–33.0)
MCHC: 33.6 g/dL (ref 31.5–35.7)
MCV: 92 fL (ref 79–97)
MONOCYTES: 10 %
Monocytes Absolute: 0.5 10*3/uL (ref 0.1–0.9)
NEUTROS PCT: 53 %
Neutrophils Absolute: 2.8 10*3/uL (ref 1.4–7.0)
Platelets: 266 10*3/uL (ref 150–379)
RBC: 4.65 x10E6/uL (ref 3.77–5.28)
RDW: 13.4 % (ref 12.3–15.4)
WBC: 5.3 10*3/uL (ref 3.4–10.8)

## 2016-11-01 LAB — BASIC METABOLIC PANEL
BUN/Creatinine Ratio: 15 (ref 12–28)
BUN: 18 mg/dL (ref 8–27)
CO2: 28 mmol/L (ref 18–29)
CREATININE: 1.23 mg/dL — AB (ref 0.57–1.00)
Calcium: 10.3 mg/dL (ref 8.7–10.3)
Chloride: 100 mmol/L (ref 96–106)
GFR calc Af Amer: 48 mL/min/{1.73_m2} — ABNORMAL LOW (ref >59)
GFR calc non Af Amer: 42 mL/min/{1.73_m2} — ABNORMAL LOW (ref >59)
GLUCOSE: 97 mg/dL (ref 65–99)
Potassium: 4.5 mmol/L (ref 3.5–5.2)
SODIUM: 143 mmol/L (ref 134–144)

## 2016-11-01 LAB — LIPID PANEL
CHOLESTEROL TOTAL: 190 mg/dL (ref 100–199)
Chol/HDL Ratio: 3.5 ratio (ref 0.0–4.4)
HDL: 55 mg/dL (ref >39)
LDL CALC: 110 mg/dL — AB (ref 0–99)
TRIGLYCERIDES: 127 mg/dL (ref 0–149)
VLDL Cholesterol Cal: 25 mg/dL (ref 5–40)

## 2016-11-01 LAB — HEMOGLOBIN A1C
Est. average glucose Bld gHb Est-mCnc: 120 mg/dL
Hgb A1c MFr Bld: 5.8 % — ABNORMAL HIGH (ref 4.8–5.6)

## 2016-11-01 LAB — RHEUMATOID FACTOR: Rhuematoid fact SerPl-aCnc: <10 IU/mL (ref 0.0–13.9)

## 2016-11-04 ENCOUNTER — Encounter: Payer: Self-pay | Admitting: Cardiology

## 2016-11-07 DIAGNOSIS — M4126 Other idiopathic scoliosis, lumbar region: Secondary | ICD-10-CM | POA: Diagnosis not present

## 2016-11-07 DIAGNOSIS — M47816 Spondylosis without myelopathy or radiculopathy, lumbar region: Secondary | ICD-10-CM | POA: Diagnosis not present

## 2016-11-07 DIAGNOSIS — M9903 Segmental and somatic dysfunction of lumbar region: Secondary | ICD-10-CM | POA: Diagnosis not present

## 2016-11-10 DIAGNOSIS — N183 Chronic kidney disease, stage 3 (moderate): Secondary | ICD-10-CM | POA: Diagnosis not present

## 2016-11-10 DIAGNOSIS — I10 Essential (primary) hypertension: Secondary | ICD-10-CM | POA: Diagnosis not present

## 2016-11-10 DIAGNOSIS — M9080 Osteopathy in diseases classified elsewhere, unspecified site: Secondary | ICD-10-CM | POA: Diagnosis not present

## 2016-11-10 DIAGNOSIS — D631 Anemia in chronic kidney disease: Secondary | ICD-10-CM | POA: Diagnosis not present

## 2016-11-10 DIAGNOSIS — E889 Metabolic disorder, unspecified: Secondary | ICD-10-CM | POA: Diagnosis not present

## 2016-11-22 ENCOUNTER — Encounter: Payer: Self-pay | Admitting: Cardiology

## 2016-11-22 NOTE — Progress Notes (Signed)
Cardiology Office Note   Date:  11/23/2016   ID:  Becky Baird, DOB 1934-01-13, MRN [id number]  PCP:  [name], MD  Cardiologist:   Minus Breeding, MD    Chief Complaint  Patient presents with  . Atrial Fibrillation      History of Present Illness: Becky Baird is a 83 y.o. female who presents for follow up of PAF and previous stroke.  Since I last saw her she has done well.  She does her chores of daily living and goes to [employer].  The patient denies any new symptoms such as chest discomfort, neck or arm discomfort. There has been no new shortness of breath, PND or orthopnea. There have been no reported palpitations, presyncope or syncope.  She is limited by back and joint pain.    Past Medical History:  Diagnosis Date  . Anxiety   . Hyperlipidemia   . Hypertension   . Memory loss   . Mild cognitive impairment, so stated   . Occlusion and stenosis of carotid artery with cerebral infarction    Bilateral less than 50% carotid stenosis.  . Persistent atrial fibrillation ([place]) 06/23/2016  . Renal cyst   . Stroke ([place])    2010, RMCA with stent  . Toe fracture 11/2015   R toe  . Ulcer 1970s    Past Surgical History:  Procedure Laterality Date  . COLONOSCOPY  03/10/2011   Procedure: COLONOSCOPY;  Surgeon: Rogene Houston, MD;  [facility];  Service: Endoscopy;  Laterality: N/A;  . COLONOSCOPY N/A 08/24/2016   Procedure: COLONOSCOPY;  Surgeon: Rogene Houston, MD;  Location[facility];  Service: Endoscopy;  Laterality: N/A;  930  . DILATION AND CURETTAGE OF UTERUS     years ago  . OVARIAN CYST REMOVAL     at least 10 years ago  . POLYPECTOMY  08/24/2016   Procedure: POLYPECTOMY;  Surgeon: Rogene Houston, MD;  [facility]: [facility];  Service: Endoscopy;;  ascending colon; hepatic flexure  . TONSILLECTOMY     childhood     Current Outpatient Prescriptions  Medication Sig Dispense Refill  . ALPRAZolam (XANAX) 0.5 MG tablet TAKE 1/2  TABLET BY MOUTH EVERY 6 HOURS AS NEEDED FOR ANXIETY AND TAKE 1/2 TO 1 TABLET AT BEDTIME AS NEEDED FOR SLEEP 45 tablet 3  . carboxymethylcellulose (REFRESH PLUS) 0.5 % SOLN Place 1 drop into both eyes daily as needed (dry eyes).    . Cholecalciferol 2000 UNITS CAPS Take 2,000 Units by mouth daily.    . fish oil-omega-3 fatty acids 1000 MG capsule Take 2 g by mouth 2 (two) times daily. Two twice a day     . hydrochlorothiazide (HYDRODIURIL) 25 MG tablet TAKE ONE TABLET BY MOUTH DAILY 30 tablet 12  . Iron-FA-B Cmp-C-Biot-Probiotic (FUSION PLUS) CAPS   3  . loratadine (CLARITIN) 10 MG tablet Take 1 tablet (10 mg total) by mouth daily. 30 tablet 5  . pantoprazole (PROTONIX) 40 MG tablet Take 1 tablet (40 mg total) by mouth daily. 30 tablet 5  . potassium chloride (K-DUR,KLOR-CON) 10 MEQ tablet TAKE ONE TABLET BY MOUTH TWICE DAILY 60 tablet 5  . Red Yeast Rice 600 MG CAPS Take 600 mg by mouth 2 (two) times daily.    . Rivaroxaban (XARELTO) 15 MG TABS tablet Take 1 tablet (15 mg total) by mouth daily with supper. 30 tablet 11  . sodium chloride (OCEAN) 0.65 % nasal spray Place 1 spray  into the nose as needed for congestion.     No current facility-administered medications for this visit.     Allergies:   Amlodipine and Niaspan [niacin er]     ROS:  Please see the history of present illness.   Otherwise, review of systems are positive for back and joint pain.   All other systems are reviewed and negative.    PHYSICAL EXAM: VS:  BP (!) 158/60   Pulse 62   Ht 5' 7.5" (1.715 m)   Wt 152 lb (68.9 kg)   BMI 23.46 kg/m  , BMI Body mass index is 23.46 kg/m. GENERAL:  Well appearing and in no distress NECK:  No jugular venous distention, waveform within normal limits, carotid upstroke brisk and symmetric, no bruits, no thyromegaly LUNGS:  Clear to auscultation bilaterally CHEST:  Unremarkable HEART:  PMI not displaced or sustained,S1 and S2 within normal limits, no S3, no S4, no clicks, no rubs, 3  out of 6 apical systolic murmur radiating slightly out the aortic outflow tract and up into the carotids, no diastolic murmurs ABD:  Flat, positive bowel sounds normal in frequency in pitch, no bruits, no rebound, no guarding, no midline pulsatile mass, no hepatomegaly, no splenomegaly EXT:  2 plus pulses throughout, no edema, no cyanosis no clubbing   EKG:  EKG is ordered today. The ekg ordered today demonstrates sinus bradycardia, rate 62, axis within normal limits, intervals within normal limits, no acute ST-T wave changes.   Recent Labs: 05/12/2016: ALT 18; Hemoglobin 13.6; TSH 3.053 10/31/2016: BUN 18; Creatinine, Ser 1.23; Platelets 266; Potassium 4.5; Sodium 143    Lipid Panel    Component Value Date/Time   CHOL 190 10/31/2016 0841   TRIG 127 10/31/2016 0841   HDL 55 10/31/2016 0841   CHOLHDL 3.5 10/31/2016 0841   CHOLHDL 3.7 05/13/2016 0547   VLDL 21 05/13/2016 0547   LDLCALC 110 (H) 10/31/2016 0841      Wt Readings from Last 3 Encounters:  11/23/16 152 lb (68.9 kg)  09/02/16 155 lb 8 oz (70.5 kg)  08/24/16 157 lb (71.2 kg)      Other studies Reviewed: Additional studies/ records that were reviewed today include: None. Review of the above records demonstrates:  Please see elsewhere in the note.     ASSESSMENT AND PLAN:   PAF:    Becky Baird has a CHA2DS2 - VASc score of 6.  She does not feel fibrillation. No change in therapy is indicated.  HTN:  She reports she gets anxious and doctor's office. She's going to check her blood pressures at home. Most of the readings here have been within acceptable limits.  AORTIC STENOSIS:  I reviewed the results of her echocardiogram done last year and she had normal LV function and mild aortic sclerosis/stenosis. This accounts for the murmur. No change in therapy is indicated.   Current medicines are reviewed at length with the patient today.  The patient does not have concerns regarding medicines.  The following  changes have been made:  no change  Labs/ tests ordered today include: None  Orders Placed This Encounter  Procedures  . EKG 12-Lead     Disposition:   FU with me in one year.     Signed, Minus Breeding, MD  11/23/2016 6:53 PM    [facility]

## 2016-11-23 ENCOUNTER — Ambulatory Visit (INDEPENDENT_AMBULATORY_CARE_PROVIDER_SITE_OTHER): Payer: Medicare Other | Admitting: Cardiology

## 2016-11-23 ENCOUNTER — Encounter: Payer: Self-pay | Admitting: Cardiology

## 2016-11-23 VITALS — BP 158/60 | HR 62 | Ht 67.5 in | Wt 152.0 lb

## 2016-11-23 DIAGNOSIS — I480 Paroxysmal atrial fibrillation: Secondary | ICD-10-CM | POA: Insufficient documentation

## 2016-11-23 DIAGNOSIS — I10 Essential (primary) hypertension: Secondary | ICD-10-CM | POA: Diagnosis not present

## 2016-11-23 DIAGNOSIS — R011 Cardiac murmur, unspecified: Secondary | ICD-10-CM | POA: Diagnosis not present

## 2016-11-23 NOTE — Patient Instructions (Signed)

## 2016-12-29 ENCOUNTER — Ambulatory Visit: Payer: Medicare Other | Admitting: Family Medicine

## 2017-01-05 ENCOUNTER — Ambulatory Visit (INDEPENDENT_AMBULATORY_CARE_PROVIDER_SITE_OTHER): Payer: Medicare Other | Admitting: Family Medicine

## 2017-01-05 ENCOUNTER — Encounter (INDEPENDENT_AMBULATORY_CARE_PROVIDER_SITE_OTHER): Payer: Self-pay

## 2017-01-05 ENCOUNTER — Encounter: Payer: Self-pay | Admitting: Family Medicine

## 2017-01-05 VITALS — BP 130/64 | Ht 67.5 in | Wt 152.0 lb

## 2017-01-05 DIAGNOSIS — R7303 Prediabetes: Secondary | ICD-10-CM

## 2017-01-05 DIAGNOSIS — M25541 Pain in joints of right hand: Secondary | ICD-10-CM | POA: Diagnosis not present

## 2017-01-05 DIAGNOSIS — I10 Essential (primary) hypertension: Secondary | ICD-10-CM | POA: Diagnosis not present

## 2017-01-05 DIAGNOSIS — R350 Frequency of micturition: Secondary | ICD-10-CM | POA: Diagnosis not present

## 2017-01-05 LAB — POCT URINALYSIS DIPSTICK
Leukocytes, UA: NEGATIVE
PH UA: 7 (ref 5.0–8.0)
RBC UA: NEGATIVE

## 2017-01-05 MED ORDER — ZOSTER VAC RECOMB ADJUVANTED 50 MCG/0.5ML IM SUSR: 0.5000 mL | Freq: Once | INTRAMUSCULAR | 1 refills | Status: AC

## 2017-01-05 NOTE — Progress Notes (Signed)
   Subjective:    Patient ID: Becky Baird, female    DOB: Sep 03, 1933, 83 y.o.   MRN: [id number]  Hypertension  This is a chronic problem. The current episode started more than 1 year ago.  She does take her blood pressure medicine on a regular basis she denies missing it She tries to be active on a regular basis She is also taking your acid blocker it's doing a good job She takes her cholesterol supplement did not tolerate statins has history of stroke and also some occasional cognitive dysfunction although her thinking is doing much better recently Patient also has concerns of urinary frequency.She has urinary frequency issues but denies dysuria hematuria denies pelvic pain. Denies loss of control  Also has concerns of arthritis to bilateral hands.She is been seen before for this she had x-rays and lab work which pointed away from rheumatoid arthritis she relates a lot of stiffness and pain in her hands. She does have some deviation and some: Enlargement.  Results for orders placed or performed in visit on 01/05/17  POCT urinalysis dipstick  Result Value Ref Range   Color, UA Yellow    Clarity, UA Clear    Glucose, UA     Bilirubin, UA     Ketones, UA     Spec Grav, UA <=1.005 (A) 1.010 - 1.025   Blood, UA Negative    pH, UA 7.0 5.0 - 8.0   Protein, UA     Urobilinogen, UA  0.2 or 1.0 E.U./dL   Nitrite, UA     Leukocytes, UA Negative Negative    Review of Systems  Constitutional: Negative for activity change and appetite change.  Gastrointestinal: Negative for abdominal pain and vomiting.  Musculoskeletal: Positive for arthralgias and back pain.  Neurological: Negative for weakness.  Psychiatric/Behavioral: Negative for confusion.     Objective:   Physical Exam  Constitutional: She appears well-nourished. No distress.  Cardiovascular: Normal rate and normal heart sounds.   No murmur heard. Pulmonary/Chest: Effort normal and breath sounds normal. No respiratory distress.   Musculoskeletal: She exhibits no edema.  Lymphadenopathy:    She has no cervical adenopathy.  Neurological: She is alert. She exhibits normal muscle tone.  Psychiatric: Her behavior is normal.  Vitals reviewed. 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

## 2017-01-07 LAB — URINE CULTURE

## 2017-01-12 ENCOUNTER — Other Ambulatory Visit: Payer: Self-pay | Admitting: *Deleted

## 2017-01-12 DIAGNOSIS — R300 Dysuria: Secondary | ICD-10-CM | POA: Diagnosis not present

## 2017-01-13 ENCOUNTER — Encounter: Payer: Self-pay | Admitting: Nurse Practitioner

## 2017-01-13 ENCOUNTER — Ambulatory Visit (INDEPENDENT_AMBULATORY_CARE_PROVIDER_SITE_OTHER): Payer: Medicare Other | Admitting: Nurse Practitioner

## 2017-01-13 VITALS — BP 180/60 | Ht 67.5 in | Wt 152.0 lb

## 2017-01-13 DIAGNOSIS — E2839 Other primary ovarian failure: Secondary | ICD-10-CM | POA: Diagnosis not present

## 2017-01-13 DIAGNOSIS — R300 Dysuria: Secondary | ICD-10-CM

## 2017-01-13 DIAGNOSIS — N949 Unspecified condition associated with female genital organs and menstrual cycle: Secondary | ICD-10-CM | POA: Diagnosis not present

## 2017-01-13 MED ORDER — TERCONAZOLE 0.4 % VA CREA: 1.0000 | TOPICAL_CREAM | Freq: Every day | VAGINAL | 0 refills | Status: DC

## 2017-01-13 NOTE — Progress Notes (Signed)
Subjective:  Presents for complaints of dysuria for the past several days. Has had urinary frequency for over a month. No urinary incontinence. Slight leakage if she waits too long to urinate. Very mild generalized constant lower abdominal/pelvic pain for the past 2-3 weeks. No constipation or diarrhea. No blood in her stools. No fever. No nausea vomiting. No vaginal discharge. No new sexual partners. UA and urine culture done yesterday. No mid back tenderness. Wears daily light pad.  Objective:   BP (!) 180/60   Ht 5' 7.5" (1.715 m)   Wt 152 lb 0.6 oz (69 kg)   BMI 23.46 kg/m  NAD. Alert, oriented. Lungs clear. Heart regular rate rhythm. External GU some mild faint white maceration, no erythema or lesions noted. Vagina no discharge, tissue pale and dry. Bimanual exam no obvious masses; mild mid pelvic area tenderness. Results for orders placed or performed in visit on 01/05/17  Urine Culture  Result Value Ref Range   Urine Culture, Routine Final report    Organism ID, Bacteria Comment   POCT urinalysis dipstick  Result Value Ref Range   Color, UA Yellow    Clarity, UA Clear    Glucose, UA     Bilirubin, UA     Ketones, UA     Spec Grav, UA <=1.005 (A) 1.010 - 1.025   Blood, UA Negative    pH, UA 7.0 5.0 - 8.0   Protein, UA     Urobilinogen, UA  0.2 or 1.0 E.U./dL   Nitrite, UA     Leukocytes, UA Negative Negative     Assessment:  Dysuria  Labial burning  Hypoestrogenism    Plan:   Meds ordered this encounter  Medications  . terconazole (TERAZOL 7) 0.4 % vaginal cream    Sig: Place 1 applicator vaginally at bedtime.    Dispense:  45 g    Refill:  0    Order Specific Question:   Supervising Provider    Answer:   Mikey Kirschner [[id number]]   Apply Terazol cream internally and externally the next 7 days. OTC vaginal moisturizers as directed, avoid estrogen use due to history. Further follow-up based on urine culture results. Call back if worsens or persists.

## 2017-01-13 NOTE — Patient Instructions (Signed)
Use Replens or similar vaginal moisturizer as directed for vaginal dryness

## 2017-01-15 LAB — URINE CULTURE

## 2017-01-16 MED ORDER — SULFAMETHOXAZOLE-TRIMETHOPRIM 800-160 MG PO TABS: ORAL_TABLET | ORAL | 0 refills | Status: DC

## 2017-01-16 NOTE — Progress Notes (Signed)
ax

## 2017-01-16 NOTE — Addendum Note (Signed)
Addended by: Launa Grill on: 01/16/2017 02:17 PM   Modules accepted: Orders

## 2017-02-06 ENCOUNTER — Other Ambulatory Visit: Payer: Self-pay | Admitting: Nurse Practitioner

## 2017-02-10 ENCOUNTER — Other Ambulatory Visit: Payer: Self-pay

## 2017-02-17 ENCOUNTER — Other Ambulatory Visit: Payer: Self-pay | Admitting: *Deleted

## 2017-02-17 ENCOUNTER — Telehealth: Payer: Self-pay | Admitting: Family Medicine

## 2017-02-17 DIAGNOSIS — R300 Dysuria: Secondary | ICD-10-CM

## 2017-02-17 LAB — POCT URINALYSIS DIPSTICK
Spec Grav, UA: <=1.005 — AB
pH, UA: 7

## 2017-02-17 NOTE — Telephone Encounter (Signed)
Spoke with patient and informed her per Dr.Scott Luking- urinalysis negative under the microscope. Patient verbalized understanding.

## 2017-02-17 NOTE — Telephone Encounter (Signed)
Urinalysis negative under the microscope please inform patient

## 2017-02-17 NOTE — Telephone Encounter (Signed)
Patient dropped off a urine specimen. Please see urine specimen

## 2017-02-20 ENCOUNTER — Other Ambulatory Visit: Payer: Self-pay | Admitting: Family Medicine

## 2017-02-20 ENCOUNTER — Other Ambulatory Visit: Payer: Self-pay | Admitting: Cardiology

## 2017-02-20 MED ORDER — RIVAROXABAN 15 MG PO TABS: 15.0000 mg | ORAL_TABLET | Freq: Every day | ORAL | 0 refills | Status: DC

## 2017-02-20 NOTE — Telephone Encounter (Signed)
New message    Pt is calling stating she is in the donut hole for her Xarelto. She is asking for samples. She said she can not afford this medication each month, what should pt do? She is asking for a call back.  Patient calling the office for samples of medication:   1.  What medication and dosage are you requesting samples for? Xarelto  15 mg  2.  Are you currently out of this medication? Not yet, will be after this week.

## 2017-02-20 NOTE — Telephone Encounter (Signed)
SPOKE TO PATIENT . SHE STATES IT WILL COST. $166.58  A MONTH. SHE STATES SHE IS NOT ABLE TO AFFORD.  SAMPLES ARE AVAILABLE [place] ,BUT PATIENT LIVES IN [place]  RN CONTACTED [facility] AT [place]. SAMPLES ARE AVAILABLE  FOR PICK UP . PATIENT GIVEN ADDRESS AND LOCATION  ,PHONE NUMBER.   PATIENT AWARE WILL NEED TO DEFER TO DR HOCHREIN TO CHANGE TO MEDICATION LESS EXPENSIVE IF POSSIBLE.

## 2017-02-20 NOTE — Telephone Encounter (Signed)
Samples ready for pick up

## 2017-03-08 ENCOUNTER — Telehealth: Payer: Self-pay | Admitting: Cardiology

## 2017-03-08 NOTE — Telephone Encounter (Signed)
No additional recommendations at this time. We can consider other alteratives if unable to get approval for patient assistance

## 2017-03-08 NOTE — Telephone Encounter (Signed)
New message    Pt is calling Dr. Rosezella [place] nurse. She states she was instructed to call us today.

## 2017-03-08 NOTE — Telephone Encounter (Signed)
Returned call to patient of Dr. Percival Spanish Patient states she spoke with Ivin Booty at the end of July about Xarelto cost (in donut hole)  -- under medication refill encounter notes - has 3 weeks of samples left She states she used to take ASA 162mg  QD Explained that all other med options are brand-name except warfarin  Advised patient/daughter [name] to request from pharmacy a statement on how much is spent on Rx(s) for patient assistance application.  Will route to Nya to have MD complete Xarelto patient assistance application, mail to patient so she can complete her portion + submit

## 2017-03-10 NOTE — Telephone Encounter (Signed)
Leave message for pt to call back to pick up assistance paperwork

## 2017-03-10 NOTE — Telephone Encounter (Signed)
Spoke with pt daughter, she want paper work to be mail out to her mom because they she lived in [place] and will not be able to come to [place], advised her it will be put in the mail today

## 2017-03-17 ENCOUNTER — Other Ambulatory Visit: Payer: Self-pay | Admitting: *Deleted

## 2017-03-17 ENCOUNTER — Telehealth: Payer: Self-pay | Admitting: Cardiology

## 2017-03-17 MED ORDER — RIVAROXABAN 15 MG PO TABS: 15.0000 mg | ORAL_TABLET | Freq: Every day | ORAL | 0 refills | Status: DC

## 2017-03-17 NOTE — Telephone Encounter (Signed)
New message    Pt is calling asking for a call back. She said it is about her medication. She said she would talk to the nurse.

## 2017-03-17 NOTE — Telephone Encounter (Signed)
Spoke with pt she states that she has about a week of samples of Xarelto 15mg . She would like to pick up some samples from the [place] office, she states that she does not drive to [place]. [phone]).  S/w Stacy @ [place] office she will put samples at the front desk for pt. Pt notified

## 2017-03-20 ENCOUNTER — Telehealth: Payer: Self-pay | Admitting: Cardiology

## 2017-03-20 NOTE — Telephone Encounter (Signed)
Spoke with pt daughter letting her know I do have pages 3-4 that was filled out by the provider and her mom just need to filled out that part that was mailed to her

## 2017-03-20 NOTE — Telephone Encounter (Signed)
Patient daughter Becky Baird) calling in reference to assistance with Xarelto. Becky Baird states that patient received paperwork and they completed it and states that instructions say to send back to [place] [facility] [place]. Patient daughter states that there are two pages that need to be completed by physician and she has some questions about the paperwork.  Becky Baird states that you can reach her at one of these numbers [phone] (mobile) and [phone] (home)

## 2017-03-20 NOTE — Telephone Encounter (Signed)
Returned the call to the patient's daughter, per the DPR. She stated that with her mom's assistance papers that it said page 3 and 4 need to be filled out by the provider but that there is not a page 3 and 4. She wanted to know if the provider's assistance may have them.

## 2017-04-03 ENCOUNTER — Other Ambulatory Visit: Payer: Self-pay | Admitting: Family Medicine

## 2017-04-03 ENCOUNTER — Telehealth: Payer: Self-pay | Admitting: Cardiology

## 2017-04-03 MED ORDER — RIVAROXABAN 15 MG PO TABS: 15.0000 mg | ORAL_TABLET | Freq: Every day | ORAL | 0 refills | Status: DC

## 2017-04-03 NOTE — Telephone Encounter (Signed)
Patient requesting samples of Rivaroxaban (XARELTO) 15 MG TABS  Please call and advise.

## 2017-04-03 NOTE — Telephone Encounter (Signed)
New Message   If no answer at home call cell   Patient calling the office for samples of medication:   1.  What medication and dosage are you requesting samples for?   Xarelto 15mg   2.  Are you currently out of this medication?  Has a week supply ,needs samples    Please call would like to pick up in [place]

## 2017-04-03 NOTE — Telephone Encounter (Signed)
Pt aware that she could pick up Xarelto 15 mg in [place] office - per today's phone note to [facility] pt was requesting to pick up in [place] - will forward to nurse at [facility]

## 2017-04-03 NOTE — Telephone Encounter (Signed)
Returned call to patient.Xarelto 15 mg samples left at front desk of [facility] office. 

## 2017-04-05 ENCOUNTER — Other Ambulatory Visit: Payer: Self-pay | Admitting: Family Medicine

## 2017-04-05 NOTE — Telephone Encounter (Signed)
This and 2 refills 

## 2017-04-07 ENCOUNTER — Other Ambulatory Visit: Payer: Self-pay | Admitting: *Deleted

## 2017-04-07 MED ORDER — ALPRAZOLAM 0.5 MG PO TABS: ORAL_TABLET | ORAL | 5 refills | Status: DC

## 2017-04-07 NOTE — Telephone Encounter (Signed)
Ok plus five monthly ref scotts name

## 2017-04-18 ENCOUNTER — Telehealth: Payer: Self-pay | Admitting: Cardiology

## 2017-04-18 ENCOUNTER — Encounter: Payer: Self-pay | Admitting: Family Medicine

## 2017-04-18 ENCOUNTER — Ambulatory Visit (INDEPENDENT_AMBULATORY_CARE_PROVIDER_SITE_OTHER): Payer: Medicare Other | Admitting: Family Medicine

## 2017-04-18 VITALS — BP 148/76 | Temp 97.5°F | Ht 67.5 in | Wt 150.0 lb

## 2017-04-18 DIAGNOSIS — R634 Abnormal weight loss: Secondary | ICD-10-CM

## 2017-04-18 DIAGNOSIS — R5383 Other fatigue: Secondary | ICD-10-CM

## 2017-04-18 DIAGNOSIS — M2550 Pain in unspecified joint: Secondary | ICD-10-CM | POA: Diagnosis not present

## 2017-04-18 DIAGNOSIS — R300 Dysuria: Secondary | ICD-10-CM | POA: Diagnosis not present

## 2017-04-18 DIAGNOSIS — M545 Low back pain, unspecified: Secondary | ICD-10-CM

## 2017-04-18 DIAGNOSIS — Z23 Encounter for immunization: Secondary | ICD-10-CM | POA: Diagnosis not present

## 2017-04-18 LAB — POCT URINALYSIS DIPSTICK
PH UA: 5 (ref 5.0–8.0)
SPEC GRAV UA: 1.015 (ref 1.010–1.025)

## 2017-04-18 MED ORDER — SERTRALINE HCL 50 MG PO TABS: 50.0000 mg | ORAL_TABLET | Freq: Every day | ORAL | 3 refills | Status: DC

## 2017-04-18 NOTE — Patient Instructions (Signed)
Start sertraline 50 mg  Use 1/2 daily for the first two weeks  Then 1 per day  Recheck in 3 weeks

## 2017-04-18 NOTE — Progress Notes (Signed)
   Subjective:    Patient ID: Becky Baird, female    DOB: [birth year], 83 y.o.   MRN: [id number]  HPI Patient in today for fatigue and stability issues. Patient has concerns of balance issues. Is inquiring of  Medication for anxiety that she can take one per day.  Significant anxiety in addition to this significant issues Also significant muscle fatigue tiredness feeling rundown when she tries to walk she leans forward she gets weekend the door as well as week into the legs she denies numbness or tingling. PMH benign kidney disease stroke history blood pressure issues  Patient would like a flu shot.  Results for orders placed or performed in visit on 04/18/17  POCT urinalysis dipstick  Result Value Ref Range   Color, UA     Clarity, UA     Glucose, UA     Bilirubin, UA     Ketones, UA     Spec Grav, UA 1.015 1.010 - 1.025   Blood, UA     pH, UA 5.0 5.0 - 8.0   Protein, UA     Urobilinogen, UA  0.2 or 1.0 E.U./dL   Nitrite, UA     Leukocytes, UA  Negative    Review of Systems  Constitutional: Negative for activity change, appetite change and fatigue.  HENT: Negative for congestion.   Respiratory: Negative for cough.   Cardiovascular: Negative for chest pain.  Gastrointestinal: Negative for abdominal pain.  Endocrine: Negative for polydipsia and polyphagia.  Musculoskeletal: Positive for back pain and gait problem.  Skin: Negative for color change.  Neurological: Negative for weakness.  Psychiatric/Behavioral: Negative for confusion.       Objective:   Physical Exam  Constitutional: She appears well-developed and well-nourished. No distress.  HENT:  Head: Normocephalic and atraumatic.  Eyes: Right eye exhibits no discharge. Left eye exhibits no discharge.  Neck: No tracheal deviation present.  Cardiovascular: Normal rate, regular rhythm and normal heart sounds.   No murmur heard. Pulmonary/Chest: Effort normal and breath sounds normal. No respiratory distress. She  has no wheezes. She has no rales.  Musculoskeletal: She exhibits no edema.  Lymphadenopathy:    She has no cervical adenopathy.  Neurological: She is alert. She exhibits normal muscle tone.  Skin: Skin is warm and dry. No erythema.  Psychiatric: Her behavior is normal.  Vitals reviewed.  Negative straight leg raise subjective lumbar discomfort walks somewhat tilted to the side and tilted forward has an unsteady gait       Assessment & Plan:  Ataxia physical therapy indicated Patient encouraged use a cane Significant lumbar pain with weakness x-rays indicated Lab work to rule out possibility of polymyalgia rheumatica New onset major depression with anxiety Zoloft 50 mg one half tablet daily for the first 2 weeks then 1 tablet daily thereafter Side effects discussed Some weight loss over the past year not much over the past 3-4 months Rechecked in approximately 3-4 weeks 25 minutes was spent with the patient. Greater than half the time was spent in discussion and answering questions and counseling regarding the issues that the patient came in for today.  Face-to-face evaluation patient would benefit from physical therapy evaluation and referral

## 2017-04-18 NOTE — Telephone Encounter (Signed)
Returned call to patient's daughter [name] left message on personal voice mail I will send message to Dr.Hochrein's CMA .

## 2017-04-18 NOTE — Telephone Encounter (Signed)
New message     Pt daughter is calling to find out about form they filled out for xarelto assistance, please call.

## 2017-04-19 ENCOUNTER — Encounter: Payer: Self-pay | Admitting: Family Medicine

## 2017-04-19 ENCOUNTER — Ambulatory Visit (HOSPITAL_COMMUNITY)
Admission: RE | Admit: 2017-04-19 | Discharge: 2017-04-19 | Disposition: A | Discharge status: Home / Self Care | Payer: Medicare Other | Source: Ambulatory Visit | Attending: Family Medicine | Admitting: Family Medicine

## 2017-04-19 DIAGNOSIS — M47896 Other spondylosis, lumbar region: Secondary | ICD-10-CM | POA: Diagnosis not present

## 2017-04-19 DIAGNOSIS — M545 Low back pain: Secondary | ICD-10-CM | POA: Diagnosis not present

## 2017-04-19 DIAGNOSIS — R5383 Other fatigue: Secondary | ICD-10-CM | POA: Diagnosis not present

## 2017-04-19 DIAGNOSIS — M2550 Pain in unspecified joint: Secondary | ICD-10-CM | POA: Diagnosis not present

## 2017-04-20 ENCOUNTER — Other Ambulatory Visit: Payer: Self-pay | Admitting: *Deleted

## 2017-04-20 ENCOUNTER — Telehealth: Payer: Self-pay

## 2017-04-20 LAB — CBC WITH DIFFERENTIAL/PLATELET
BASOS: 1 %
Basophils Absolute: 0 10*3/uL (ref 0.0–0.2)
EOS (ABSOLUTE): 0.1 10*3/uL (ref 0.0–0.4)
EOS: 1 %
HEMATOCRIT: 39.6 % (ref 34.0–46.6)
HEMOGLOBIN: 13.2 g/dL (ref 11.1–15.9)
IMMATURE GRANULOCYTES: 0 %
Immature Grans (Abs): 0 10*3/uL (ref 0.0–0.1)
Lymphocytes Absolute: 1.7 10*3/uL (ref 0.7–3.1)
Lymphs: 31 %
MCH: 31.2 pg (ref 26.6–33.0)
MCHC: 33.3 g/dL (ref 31.5–35.7)
MCV: 94 fL (ref 79–97)
MONOCYTES: 6 %
Monocytes Absolute: 0.3 10*3/uL (ref 0.1–0.9)
Neutrophils Absolute: 3.5 10*3/uL (ref 1.4–7.0)
Neutrophils: 61 %
Platelets: 252 10*3/uL (ref 150–379)
RBC: 4.23 x10E6/uL (ref 3.77–5.28)
RDW: 13.2 % (ref 12.3–15.4)
WBC: 5.7 10*3/uL (ref 3.4–10.8)

## 2017-04-20 LAB — C-REACTIVE PROTEIN: CRP: 0.8 mg/L (ref 0.0–4.9)

## 2017-04-20 LAB — SEDIMENTATION RATE: Sed Rate: 9 mm/hr (ref 0–40)

## 2017-04-20 MED ORDER — RIVAROXABAN 15 MG PO TABS: 15.0000 mg | ORAL_TABLET | Freq: Every day | ORAL | 0 refills | Status: DC

## 2017-04-20 NOTE — Telephone Encounter (Signed)
spoke with pt daughter letting her know we have not reviewed anything from [facility] pt assistance and she can pick up samples at the [place] office.

## 2017-04-20 NOTE — Telephone Encounter (Signed)
I spoke with Becky Baird. She is going to resend the paperwork immediately.

## 2017-04-20 NOTE — Telephone Encounter (Addendum)
**Note De-Identified  Obfuscation** I called Becky Baird and Becky Baird pt assistance program to check the progress on the pts Xarelto pt assistance application. Per Erlene Quan they have received the pts part of the application but are still needing pages 3 and 4 which is the provider part.

## 2017-04-21 ENCOUNTER — Telehealth: Payer: Self-pay | Admitting: *Deleted

## 2017-04-21 DIAGNOSIS — F329 Major depressive disorder, single episode, unspecified: Secondary | ICD-10-CM

## 2017-04-21 DIAGNOSIS — F32A Depression, unspecified: Secondary | ICD-10-CM

## 2017-04-21 NOTE — Telephone Encounter (Signed)
Patient has agreed to counseling and would like to see someone that is local.

## 2017-04-21 NOTE — Telephone Encounter (Signed)
I would recommend that we go through the weekend without Zoloft or any other antidepressant. Then early next week we will try a different medicine. We will call patient at the start of the week regarding the recommendation

## 2017-04-21 NOTE — Telephone Encounter (Signed)
I called Becky Baird and Becky Baird to confirmed that they received pages 3&4. Juan with Becky Baird and Becky Baird confirmed that they did in fact receive pages 3 & 4 this morning.  He stated that as long as [employer] has everything they need he is hopeful that they will have a decision by the end of next week. He was forwarding her paperwork to the processing department for final decision.   Becky Baird said that upon a completion they will fax the decision to the provider's office and they will mail their decision to the patient's home. The patient is always welcome to call them at [phone] to get updates.  I notified the daughter that if she hasn't heard from [place] by next Friday regarding their decision she can call the 800 number listed above for details. She was grateful for the follow up.

## 2017-04-21 NOTE — Telephone Encounter (Signed)
Please set up counseling with [facility] here in [place]

## 2017-04-21 NOTE — Addendum Note (Signed)
Addended by: Dairl Ponder on: 04/21/2017 04:52 PM   Modules accepted: Orders

## 2017-04-21 NOTE — Telephone Encounter (Signed)
Referral ordered in [facility]. 

## 2017-04-21 NOTE — Telephone Encounter (Signed)
Daughter stated that the patient took 1/2 of the Zoloft yesterday and it did not go well. Patient could not function- she was anxious and trembling think- couldn't hard;y brush teeth without having to focus very hard. Patient advised to not take Zoloft today and await Dr Bary Leriche advise

## 2017-04-21 NOTE — Telephone Encounter (Signed)
Daughter (POA) advised Degenerative /arthritis-definitely needs to do the physical therapy. We are monitoring for any underlying neurologic issue-I do recommend a follow-up visit within a few weeks time. As for antidepressant we can hold off on any those medicines. In situations where a person does not tolerate antidepressant sometimes they will benefit from counseling. That is something we can help set up. If family interested please initiate referral for counseling. Daughter verbalized understanding and will discuss counseling with her mother and let us know.

## 2017-04-21 NOTE — Telephone Encounter (Signed)
Left message to return call to daughter [name]

## 2017-04-21 NOTE — Telephone Encounter (Signed)
Degenerative /arthritis-definitely needs to do the physical therapy. We are monitoring for any underlying neurologic issue-I do recommend a follow-up visit within a few weeks time. As for antidepressant we can hold off on any those medicines. In situations where a person does not tolerate antidepressant sometimes they will benefit from counseling. That is something we can help set up. If family interested please initiate referral for counseling

## 2017-04-21 NOTE — Telephone Encounter (Signed)
Daughter advised he would recommend that we go through the weekend without Zoloft or any other antidepressant. Then early next week we will try a different medicine. We will call patient at the start of the week regarding the recommendation. Daughter verbalized understanding and stated the patient stated she will NOT take another antidepressant. Daughter [name]) also wants to know what you feel was causing the ataxia to start with. Do you think it is from previous stroke, new stroke or degenerative changes and arthritis?

## 2017-04-26 ENCOUNTER — Encounter: Payer: Self-pay | Admitting: Family Medicine

## 2017-04-27 ENCOUNTER — Other Ambulatory Visit: Payer: Self-pay | Admitting: Family Medicine

## 2017-04-27 DIAGNOSIS — Z1231 Encounter for screening mammogram for malignant neoplasm of breast: Secondary | ICD-10-CM

## 2017-04-28 ENCOUNTER — Telehealth: Payer: Self-pay | Admitting: *Deleted

## 2017-04-28 NOTE — Telephone Encounter (Signed)
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

## 2017-05-02 DIAGNOSIS — R269 Unspecified abnormalities of gait and mobility: Secondary | ICD-10-CM | POA: Diagnosis not present

## 2017-05-02 DIAGNOSIS — R270 Ataxia, unspecified: Secondary | ICD-10-CM | POA: Diagnosis not present

## 2017-05-09 DIAGNOSIS — R269 Unspecified abnormalities of gait and mobility: Secondary | ICD-10-CM | POA: Diagnosis not present

## 2017-05-09 DIAGNOSIS — R270 Ataxia, unspecified: Secondary | ICD-10-CM | POA: Diagnosis not present

## 2017-05-11 ENCOUNTER — Ambulatory Visit (HOSPITAL_COMMUNITY)
Admission: RE | Admit: 2017-05-11 | Discharge: 2017-05-11 | Disposition: A | Discharge status: Home / Self Care | Payer: Medicare Other | Source: Ambulatory Visit | Attending: Family Medicine | Admitting: Family Medicine

## 2017-05-11 ENCOUNTER — Encounter (HOSPITAL_COMMUNITY): Payer: Self-pay

## 2017-05-11 DIAGNOSIS — Z1231 Encounter for screening mammogram for malignant neoplasm of breast: Secondary | ICD-10-CM | POA: Insufficient documentation

## 2017-05-11 DIAGNOSIS — R928 Other abnormal and inconclusive findings on diagnostic imaging of breast: Secondary | ICD-10-CM | POA: Diagnosis not present

## 2017-05-11 DIAGNOSIS — R270 Ataxia, unspecified: Secondary | ICD-10-CM | POA: Diagnosis not present

## 2017-05-11 DIAGNOSIS — R269 Unspecified abnormalities of gait and mobility: Secondary | ICD-10-CM | POA: Diagnosis not present

## 2017-05-12 DIAGNOSIS — N952 Postmenopausal atrophic vaginitis: Secondary | ICD-10-CM | POA: Diagnosis not present

## 2017-05-12 DIAGNOSIS — R300 Dysuria: Secondary | ICD-10-CM | POA: Diagnosis not present

## 2017-05-15 ENCOUNTER — Telehealth: Payer: Self-pay | Admitting: Family Medicine

## 2017-05-15 ENCOUNTER — Other Ambulatory Visit: Payer: Self-pay | Admitting: Family Medicine

## 2017-05-15 DIAGNOSIS — N6320 Unspecified lump in the left breast, unspecified quadrant: Secondary | ICD-10-CM

## 2017-05-15 NOTE — Telephone Encounter (Signed)
Believe a follow-up visit was made because we are starting her on antidepressant. She did not tolerate the medication. She was referred for counseling. If her mother overall is feeling well and is able to follow through with counseling it is not necessary to follow-up for this visit they can cancel the visit-overall she should follow-up at least every 4 months for a general health check therefore she could follow-up in February sooner if any problems

## 2017-05-15 NOTE — Telephone Encounter (Signed)
Patient has an appointment on 06/06/17 with Dr. Nicki Reaper for a follow up.  Her daughter wants to know if she needs to come in for this appointment?

## 2017-05-16 ENCOUNTER — Ambulatory Visit: Payer: Medicare Other | Admitting: Family Medicine

## 2017-05-16 DIAGNOSIS — R270 Ataxia, unspecified: Secondary | ICD-10-CM | POA: Diagnosis not present

## 2017-05-16 DIAGNOSIS — R269 Unspecified abnormalities of gait and mobility: Secondary | ICD-10-CM | POA: Diagnosis not present

## 2017-05-16 NOTE — Telephone Encounter (Signed)
Daughter stated she will cancel the appointment. Daughter stated that the patient has refused medication and counseling. Patient is no better but no worse-patient having to learn how to cope with losing control of everything and everything being harder to accomplish these days. Daughter states she will call back if she decides to do anything or gets worse

## 2017-05-18 ENCOUNTER — Telehealth: Payer: Self-pay | Admitting: *Deleted

## 2017-05-18 ENCOUNTER — Other Ambulatory Visit: Payer: Self-pay | Admitting: Family Medicine

## 2017-05-18 DIAGNOSIS — R269 Unspecified abnormalities of gait and mobility: Secondary | ICD-10-CM | POA: Diagnosis not present

## 2017-05-18 DIAGNOSIS — R270 Ataxia, unspecified: Secondary | ICD-10-CM | POA: Diagnosis not present

## 2017-05-18 NOTE — Telephone Encounter (Signed)
Patient called to let Dr Wolfgang Phoenix know she had her second shingles shot at [facility].

## 2017-05-18 NOTE — Telephone Encounter (Signed)
Please record shingrix shot #2 on her immun record thanks

## 2017-05-19 NOTE — Telephone Encounter (Signed)
Shingrix added to immunization record.

## 2017-05-23 DIAGNOSIS — R269 Unspecified abnormalities of gait and mobility: Secondary | ICD-10-CM | POA: Diagnosis not present

## 2017-05-23 DIAGNOSIS — R270 Ataxia, unspecified: Secondary | ICD-10-CM | POA: Diagnosis not present

## 2017-05-25 DIAGNOSIS — R270 Ataxia, unspecified: Secondary | ICD-10-CM | POA: Diagnosis not present

## 2017-05-25 DIAGNOSIS — R269 Unspecified abnormalities of gait and mobility: Secondary | ICD-10-CM | POA: Diagnosis not present

## 2017-05-30 ENCOUNTER — Ambulatory Visit (HOSPITAL_COMMUNITY)
Admission: RE | Admit: 2017-05-30 | Discharge: 2017-05-30 | Disposition: A | Discharge status: Home / Self Care | Payer: Medicare Other | Source: Ambulatory Visit | Attending: Family Medicine | Admitting: Family Medicine

## 2017-05-30 DIAGNOSIS — R928 Other abnormal and inconclusive findings on diagnostic imaging of breast: Secondary | ICD-10-CM | POA: Diagnosis not present

## 2017-05-30 DIAGNOSIS — R270 Ataxia, unspecified: Secondary | ICD-10-CM | POA: Diagnosis not present

## 2017-05-30 DIAGNOSIS — N6320 Unspecified lump in the left breast, unspecified quadrant: Secondary | ICD-10-CM | POA: Insufficient documentation

## 2017-05-30 DIAGNOSIS — R269 Unspecified abnormalities of gait and mobility: Secondary | ICD-10-CM | POA: Diagnosis not present

## 2017-06-01 DIAGNOSIS — R270 Ataxia, unspecified: Secondary | ICD-10-CM | POA: Diagnosis not present

## 2017-06-01 DIAGNOSIS — R269 Unspecified abnormalities of gait and mobility: Secondary | ICD-10-CM | POA: Diagnosis not present

## 2017-06-03 ENCOUNTER — Other Ambulatory Visit: Payer: Self-pay | Admitting: Family Medicine

## 2017-06-06 ENCOUNTER — Ambulatory Visit: Payer: Medicare Other | Admitting: Family Medicine

## 2017-06-06 DIAGNOSIS — R269 Unspecified abnormalities of gait and mobility: Secondary | ICD-10-CM | POA: Diagnosis not present

## 2017-06-06 DIAGNOSIS — R270 Ataxia, unspecified: Secondary | ICD-10-CM | POA: Diagnosis not present

## 2017-06-08 DIAGNOSIS — R269 Unspecified abnormalities of gait and mobility: Secondary | ICD-10-CM | POA: Diagnosis not present

## 2017-06-08 DIAGNOSIS — R270 Ataxia, unspecified: Secondary | ICD-10-CM | POA: Diagnosis not present

## 2017-06-09 DIAGNOSIS — R350 Frequency of micturition: Secondary | ICD-10-CM | POA: Diagnosis not present

## 2017-06-09 DIAGNOSIS — N952 Postmenopausal atrophic vaginitis: Secondary | ICD-10-CM | POA: Diagnosis not present

## 2017-06-09 DIAGNOSIS — R351 Nocturia: Secondary | ICD-10-CM | POA: Diagnosis not present

## 2017-06-09 DIAGNOSIS — R3915 Urgency of urination: Secondary | ICD-10-CM | POA: Diagnosis not present

## 2017-06-12 DIAGNOSIS — R269 Unspecified abnormalities of gait and mobility: Secondary | ICD-10-CM | POA: Diagnosis not present

## 2017-06-12 DIAGNOSIS — R270 Ataxia, unspecified: Secondary | ICD-10-CM | POA: Diagnosis not present

## 2017-06-13 ENCOUNTER — Telehealth: Payer: Self-pay | Admitting: Cardiology

## 2017-06-13 DIAGNOSIS — R269 Unspecified abnormalities of gait and mobility: Secondary | ICD-10-CM | POA: Diagnosis not present

## 2017-06-13 DIAGNOSIS — R270 Ataxia, unspecified: Secondary | ICD-10-CM | POA: Diagnosis not present

## 2017-06-13 MED ORDER — RIVAROXABAN 15 MG PO TABS
15.0000 mg | ORAL_TABLET | Freq: Every day | ORAL | 0 refills | Status: DC
Start: 2017-06-13 — End: 2017-08-01

## 2017-06-13 NOTE — Telephone Encounter (Signed)
Xarelto samples available for pt to pick up

## 2017-06-13 NOTE — Telephone Encounter (Addendum)
Spoke with patient and she wanted samples to pick up at [facility] office as she lives there and does drive to [place]. Spoke with Stac A at [facility] office and they do have samples. Advised patient and will forward to [facility]

## 2017-06-13 NOTE — Telephone Encounter (Signed)
Patient calling the office for samples of medication:   1.  What medication and dosage are you requesting samples for?Becky Baird said would check the [facility] office to see if they have any-if so she can go there to pick them up  2.  Are you currently out of this medication? yes

## 2017-06-20 DIAGNOSIS — N904 Leukoplakia of vulva: Secondary | ICD-10-CM | POA: Diagnosis not present

## 2017-06-20 DIAGNOSIS — L900 Lichen sclerosus et atrophicus: Secondary | ICD-10-CM | POA: Diagnosis not present

## 2017-06-23 DIAGNOSIS — R270 Ataxia, unspecified: Secondary | ICD-10-CM | POA: Diagnosis not present

## 2017-06-23 DIAGNOSIS — R269 Unspecified abnormalities of gait and mobility: Secondary | ICD-10-CM | POA: Diagnosis not present

## 2017-06-27 DIAGNOSIS — R269 Unspecified abnormalities of gait and mobility: Secondary | ICD-10-CM | POA: Diagnosis not present

## 2017-06-27 DIAGNOSIS — R270 Ataxia, unspecified: Secondary | ICD-10-CM | POA: Diagnosis not present

## 2017-06-28 ENCOUNTER — Telehealth: Payer: Self-pay | Admitting: Cardiology

## 2017-06-28 ENCOUNTER — Other Ambulatory Visit: Payer: Self-pay | Admitting: Physician Assistant

## 2017-06-28 NOTE — Telephone Encounter (Signed)
Patient calling the office for samples of medication:   1.  What medication and dosage are you requesting samples for?xarelto  2.  Are you currently out of this medication? A few left

## 2017-06-28 NOTE — Telephone Encounter (Signed)
Patient aware samples are at the front desk for pick up  

## 2017-07-06 DIAGNOSIS — R270 Ataxia, unspecified: Secondary | ICD-10-CM | POA: Diagnosis not present

## 2017-07-06 DIAGNOSIS — R269 Unspecified abnormalities of gait and mobility: Secondary | ICD-10-CM | POA: Diagnosis not present

## 2017-07-11 DIAGNOSIS — L900 Lichen sclerosus et atrophicus: Secondary | ICD-10-CM | POA: Diagnosis not present

## 2017-07-13 DIAGNOSIS — R269 Unspecified abnormalities of gait and mobility: Secondary | ICD-10-CM | POA: Diagnosis not present

## 2017-07-13 DIAGNOSIS — R270 Ataxia, unspecified: Secondary | ICD-10-CM | POA: Diagnosis not present

## 2017-07-31 ENCOUNTER — Telehealth: Payer: Self-pay | Admitting: Cardiology

## 2017-07-31 NOTE — Telephone Encounter (Signed)
Samples available. Left [name] a message stating the samples have been placed at the front desk and are available for pickup.

## 2017-07-31 NOTE — Telephone Encounter (Signed)
New message  Patient calling the office for samples of medication:   1.  What medication and dosage are you requesting samples for? Xarelto 15mg    2.  Are you currently out of this medication? Yes. Pt daughter would like to know if the [facility] office does not have the samples, could the [place] office be checked as well. Pt lives in [place].

## 2017-08-01 ENCOUNTER — Telehealth: Payer: Self-pay | Admitting: Cardiology

## 2017-08-01 ENCOUNTER — Telehealth: Payer: Self-pay | Admitting: *Deleted

## 2017-08-01 MED ORDER — RIVAROXABAN 15 MG PO TABS: 15.0000 mg | ORAL_TABLET | Freq: Every day | ORAL | 0 refills | Status: DC

## 2017-08-01 NOTE — Telephone Encounter (Signed)
ERROR

## 2017-08-01 NOTE — Telephone Encounter (Signed)
New Message       1. What medication and dosage are you requesting samples for? Xarelto 15mg    2. Are you currentlyout of this medication? Yes. Pt daughter would like to know if the [facility] office does not have the samples, could the [place] office be checked as well. Pt lives in [place].  Patient calling the office for samples of medication:  [name] patients daughter is calling back about samples. She called yesterday and wanted the samples to be available for pick up in [place]. She wants to know is their anyway that they can be picked up in [place] or even [place]. The patient does not drive to [place]. Please call.

## 2017-08-02 ENCOUNTER — Telehealth: Payer: Self-pay | Admitting: Cardiology

## 2017-08-02 MED ORDER — RIVAROXABAN 15 MG PO TABS: 15.0000 mg | ORAL_TABLET | Freq: Every day | ORAL | 0 refills | Status: DC

## 2017-08-02 NOTE — Telephone Encounter (Signed)
Samples available and ready for pickup. Patient notified.

## 2017-08-02 NOTE — Telephone Encounter (Signed)
Patient called requesting samples of Rivaroxaban (XARELTO) 15 MG TABS tablet  [phone].

## 2017-08-08 ENCOUNTER — Ambulatory Visit (INDEPENDENT_AMBULATORY_CARE_PROVIDER_SITE_OTHER): Payer: Medicare Other | Admitting: Urology

## 2017-08-08 DIAGNOSIS — R351 Nocturia: Secondary | ICD-10-CM

## 2017-08-08 DIAGNOSIS — N952 Postmenopausal atrophic vaginitis: Secondary | ICD-10-CM

## 2017-09-07 ENCOUNTER — Other Ambulatory Visit: Payer: Self-pay | Admitting: Family Medicine

## 2017-09-21 ENCOUNTER — Telehealth: Payer: Self-pay | Admitting: Cardiology

## 2017-09-21 MED ORDER — RIVAROXABAN 15 MG PO TABS: 15.0000 mg | ORAL_TABLET | Freq: Every day | ORAL | 0 refills | Status: DC

## 2017-09-21 NOTE — Telephone Encounter (Signed)
Asking for xarelto samples

## 2017-09-21 NOTE — Telephone Encounter (Signed)
Pt will pick up Xarelto samples later today

## 2017-10-20 ENCOUNTER — Telehealth: Payer: Self-pay | Admitting: Cardiology

## 2017-10-20 MED ORDER — RIVAROXABAN 15 MG PO TABS: 15.0000 mg | ORAL_TABLET | Freq: Every day | ORAL | 0 refills | Status: DC

## 2017-10-20 NOTE — Telephone Encounter (Signed)
Patient informed that samples are available for pick up.

## 2017-10-20 NOTE — Telephone Encounter (Signed)
[name]   She would like to know if we have any samples of Becky Baird that she could pick up.  Stated that she does not drive to [place].  [phone]

## 2017-11-13 ENCOUNTER — Encounter: Payer: Self-pay | Admitting: Nurse Practitioner

## 2017-11-13 ENCOUNTER — Telehealth: Payer: Self-pay | Admitting: Nurse Practitioner

## 2017-11-13 ENCOUNTER — Ambulatory Visit (INDEPENDENT_AMBULATORY_CARE_PROVIDER_SITE_OTHER): Payer: Medicare Other | Admitting: Nurse Practitioner

## 2017-11-13 VITALS — BP 148/82 | Temp 97.6°F | Ht 67.5 in | Wt 147.0 lb

## 2017-11-13 DIAGNOSIS — M792 Neuralgia and neuritis, unspecified: Secondary | ICD-10-CM | POA: Diagnosis not present

## 2017-11-13 DIAGNOSIS — M5441 Lumbago with sciatica, right side: Secondary | ICD-10-CM | POA: Diagnosis not present

## 2017-11-13 DIAGNOSIS — M19042 Primary osteoarthritis, left hand: Secondary | ICD-10-CM

## 2017-11-13 DIAGNOSIS — M19041 Primary osteoarthritis, right hand: Secondary | ICD-10-CM | POA: Insufficient documentation

## 2017-11-13 LAB — POCT URINALYSIS DIPSTICK
SPEC GRAV UA: 1.01 (ref 1.010–1.025)
pH, UA: 7 (ref 5.0–8.0)

## 2017-11-13 NOTE — Telephone Encounter (Signed)
Daughter dropped off a handicap placard form to be filled out for the pt. Form was given to Ko Olina.

## 2017-11-13 NOTE — Progress Notes (Signed)
Subjective: Presents with 2 of her daughters for complaints of right low back pain that began on 4/11.  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  No specific history of injury.  Pain is now going down to the right knee.  States the right leg has "gave way" and she is lost her balance and almost fell.  Now using a cane since then to help her balance due to right leg pain.  Has taken Tylenol with minimal relief.  Has a history of osteoarthritis in both hands.  No dysuria.  Pain with certain movements.  Before her injury, patient had an active lifestyle with regular exercise.  Some relief with ice applications.  Objective:   BP (!) 148/82   Temp 97.6 F (36.4 C) (Oral)   Ht 5' 7.5" (1.715 m)   Wt 147 lb (66.7 kg)   BMI 22.68 kg/m  NAD.  Alert, oriented.  Lungs clear.  Heart regular rate and rhythm.  Distinct pain noted in the right lumbar area going into the right buttock.  SLR positive BiLAP more on the right.  Reflexes normal limit lower extremities.  Needs assistance getting on and off the table due to pain and weakness.  Using her cane to help her balance. Results for orders placed or performed in visit on 11/13/17  POCT Urinalysis Dipstick  Result Value Ref Range   Color, UA     Clarity, UA     Glucose, UA     Bilirubin, UA     Ketones, UA     Spec Grav, UA 1.010 1.010 - 1.025   Blood, UA     pH, UA 7.0 5.0 - 8.0   Protein, UA     Urobilinogen, UA  0.2 or 1.0 E.U./dL   Nitrite, UA     Leukocytes, UA  Negative   Appearance     Odor     Lumbar spine x-ray dated 04/19/2017 shows multilevel degenerative disc disease and facet arthropathy most severe at L4-L5.  There was also right lateral displacement of the lower lumbar spine at L4.  Levoscoliosis of the lumbar spine convex left at L3.  Assessment:  Acute right-sided low back pain with right-sided sciatica - Plan: POCT Urinalysis Dipstick, MR Lumbar Spine Wo Contrast  Neuropathic pain, leg,  right    Plan: Continue Tylenol as directed.  Also may try: Lidocaine patch Biofreeze Ice/heat applications TENS unit ([place]) Explained to family that may take some time if we can get MRI approval.  Will contact family once PA has been done through her insurance.  Call back sooner if symptoms worsen.

## 2017-11-13 NOTE — Patient Instructions (Signed)
Lidocaine patch Biofreeze Ice/heat applications TENS unit [facility] Smart Relief)

## 2017-11-13 NOTE — Telephone Encounter (Signed)
Done and given to front desk staff

## 2017-11-15 ENCOUNTER — Encounter: Payer: Self-pay | Admitting: Cardiology

## 2017-11-17 ENCOUNTER — Ambulatory Visit (HOSPITAL_COMMUNITY)
Admission: RE | Admit: 2017-11-17 | Discharge: 2017-11-17 | Disposition: A | Discharge status: Home / Self Care | Payer: Medicare Other | Source: Ambulatory Visit | Attending: Nurse Practitioner | Admitting: Nurse Practitioner

## 2017-11-17 DIAGNOSIS — M5441 Lumbago with sciatica, right side: Secondary | ICD-10-CM | POA: Insufficient documentation

## 2017-11-17 DIAGNOSIS — M5136 Other intervertebral disc degeneration, lumbar region: Secondary | ICD-10-CM | POA: Insufficient documentation

## 2017-11-17 DIAGNOSIS — M4316 Spondylolisthesis, lumbar region: Secondary | ICD-10-CM | POA: Insufficient documentation

## 2017-11-17 DIAGNOSIS — M545 Low back pain: Secondary | ICD-10-CM | POA: Diagnosis not present

## 2017-11-17 NOTE — Addendum Note (Signed)
Addended by: Karle Barr on: 11/17/2017 01:39 PM   Modules accepted: Orders

## 2017-11-21 ENCOUNTER — Encounter: Payer: Self-pay | Admitting: Family Medicine

## 2017-11-21 NOTE — Telephone Encounter (Signed)
ERROR

## 2017-11-22 ENCOUNTER — Encounter: Payer: Self-pay | Admitting: Family Medicine

## 2017-11-22 ENCOUNTER — Other Ambulatory Visit: Payer: Self-pay | Admitting: Family Medicine

## 2017-11-22 ENCOUNTER — Ambulatory Visit (HOSPITAL_COMMUNITY)
Admission: RE | Admit: 2017-11-22 | Discharge: 2017-11-22 | Disposition: A | Discharge status: Home / Self Care | Payer: Medicare Other | Source: Ambulatory Visit | Attending: Family Medicine | Admitting: Family Medicine

## 2017-11-22 ENCOUNTER — Ambulatory Visit (INDEPENDENT_AMBULATORY_CARE_PROVIDER_SITE_OTHER): Payer: Medicare Other | Admitting: Family Medicine

## 2017-11-22 VITALS — BP 168/72 | Ht 67.5 in | Wt 145.0 lb

## 2017-11-22 DIAGNOSIS — M47814 Spondylosis without myelopathy or radiculopathy, thoracic region: Secondary | ICD-10-CM | POA: Diagnosis not present

## 2017-11-22 DIAGNOSIS — M419 Scoliosis, unspecified: Secondary | ICD-10-CM | POA: Insufficient documentation

## 2017-11-22 DIAGNOSIS — R5383 Other fatigue: Secondary | ICD-10-CM

## 2017-11-22 DIAGNOSIS — R600 Localized edema: Secondary | ICD-10-CM | POA: Diagnosis not present

## 2017-11-22 DIAGNOSIS — M549 Dorsalgia, unspecified: Secondary | ICD-10-CM

## 2017-11-22 DIAGNOSIS — E785 Hyperlipidemia, unspecified: Secondary | ICD-10-CM

## 2017-11-22 DIAGNOSIS — R7303 Prediabetes: Secondary | ICD-10-CM | POA: Diagnosis not present

## 2017-11-22 DIAGNOSIS — Z79899 Other long term (current) drug therapy: Secondary | ICD-10-CM | POA: Diagnosis not present

## 2017-11-22 NOTE — Progress Notes (Signed)
Subjective:    Patient ID: Becky Baird, female    DOB: 1934-06-02, 84 y.o.   MRN: [id number]  HPI Very nice patient here today having significant back troubles Previous notes from our office was reviewed MRI was reviewed Patient comes in today to discuss the findings of the MRI to discuss her symptoms as well as to discuss options Also patient has some underlying other issues with significant DOE, fatigue, leg swelling Will be seeing cardiology in the near future Patient states in early April she was doing an exercise class at [facility] she had low back pain Is been severe Hurts with certain movements Hurts when she is laying in bed Denies any new injury Relates the pain so severe that the right leg gave way She relates no loss of bowel or bladder control No chest tightness or pressure Does get short of breath with activity Easy fatigability The MRI was reviewed in detail with the patient as well as with the her family Patient is here today to discuss neurosurgeon for back pain and bulging disc results from her MRI. Patient is on multiple medications is due for lab work Review of Systems  Constitutional: Negative for activity change, appetite change and fatigue.  HENT: Negative for congestion and rhinorrhea.   Respiratory: Positive for shortness of breath. Negative for cough, wheezing and stridor.   Cardiovascular: Negative for chest pain.  Gastrointestinal: Negative for abdominal pain, blood in stool and constipation.  Endocrine: Negative for polydipsia and polyphagia.  Musculoskeletal: Positive for arthralgias, back pain and gait problem. Negative for joint swelling and myalgias.  Skin: Negative for color change.  Neurological: Negative for dizziness, weakness and headaches.  Psychiatric/Behavioral: Negative for agitation and confusion.       Objective:   Physical Exam  Constitutional: She appears well-nourished. No distress.  HENT:  Head: Normocephalic and atraumatic.    Eyes: Conjunctivae are normal. Right eye exhibits no discharge. Left eye exhibits no discharge.  Neck: No tracheal deviation present.  Cardiovascular: Normal rate, regular rhythm and normal heart sounds.  No murmur heard. Pulmonary/Chest: Effort normal and breath sounds normal. No respiratory distress.  Abdominal: Soft. She exhibits no distension.  Musculoskeletal: She exhibits no edema or deformity.  Lymphadenopathy:    She has no cervical adenopathy.  Neurological: She is alert. She exhibits normal muscle tone. Coordination normal.  Skin: Skin is warm and dry.  Psychiatric: Her behavior is normal.  Vitals reviewed. Patient has subjective discomfort in the lower back Tilts to the right when she walks Uses a cane Takes very small steps Obviously having significant problems Patient also with pain and discomfort with certain movements with the back No weakness detected in her legs        Assessment & Plan:  Low back pain Patient needs physical therapy I believe physical therapy will help her greatly but it will take weeks to get better I do not find evidence of a neurosurgical issue I recommended to the patient that she does not need to see neurosurgery at this time Certainly if she does not progress may need injections Questions were answered by the family regarding her back Lab work ordered regarding her chronic medical issues I do believe it is wise for the patient to follow-up with cardiology more than likely they will do a echo Patient may need further interventions Follow-up in 2 to 3 weeks with [place] Greater than 40 minutes spent with this patient evaluating her reviewing the MRI discussing treatment options and also  answering questions from her and her nice family

## 2017-11-23 DIAGNOSIS — Z79899 Other long term (current) drug therapy: Secondary | ICD-10-CM | POA: Diagnosis not present

## 2017-11-23 DIAGNOSIS — E785 Hyperlipidemia, unspecified: Secondary | ICD-10-CM | POA: Diagnosis not present

## 2017-11-23 DIAGNOSIS — R600 Localized edema: Secondary | ICD-10-CM | POA: Diagnosis not present

## 2017-11-23 DIAGNOSIS — R5383 Other fatigue: Secondary | ICD-10-CM | POA: Diagnosis not present

## 2017-11-23 DIAGNOSIS — R7303 Prediabetes: Secondary | ICD-10-CM | POA: Diagnosis not present

## 2017-11-24 ENCOUNTER — Ambulatory Visit (INDEPENDENT_AMBULATORY_CARE_PROVIDER_SITE_OTHER): Payer: Medicare Other | Admitting: Nurse Practitioner

## 2017-11-24 ENCOUNTER — Encounter: Payer: Self-pay | Admitting: Nurse Practitioner

## 2017-11-24 ENCOUNTER — Encounter: Payer: Self-pay | Admitting: Family Medicine

## 2017-11-24 ENCOUNTER — Other Ambulatory Visit: Payer: Self-pay | Admitting: Nurse Practitioner

## 2017-11-24 ENCOUNTER — Encounter (INDEPENDENT_AMBULATORY_CARE_PROVIDER_SITE_OTHER): Payer: Self-pay

## 2017-11-24 VITALS — BP 128/68 | Ht 67.5 in | Wt 144.0 lb

## 2017-11-24 DIAGNOSIS — Z8673 Personal history of transient ischemic attack (TIA), and cerebral infarction without residual deficits: Secondary | ICD-10-CM | POA: Diagnosis not present

## 2017-11-24 DIAGNOSIS — M5441 Lumbago with sciatica, right side: Secondary | ICD-10-CM | POA: Insufficient documentation

## 2017-11-24 DIAGNOSIS — M545 Low back pain, unspecified: Secondary | ICD-10-CM

## 2017-11-24 DIAGNOSIS — R2681 Unsteadiness on feet: Secondary | ICD-10-CM | POA: Insufficient documentation

## 2017-11-24 LAB — CBC WITH DIFFERENTIAL/PLATELET
Basophils Absolute: 0 10*3/uL (ref 0.0–0.2)
Basos: 0 %
EOS (ABSOLUTE): 0.3 10*3/uL (ref 0.0–0.4)
EOS: 5 %
HEMATOCRIT: 37.4 % (ref 34.0–46.6)
HEMOGLOBIN: 13.3 g/dL (ref 11.1–15.9)
Immature Grans (Abs): 0 10*3/uL (ref 0.0–0.1)
Immature Granulocytes: 0 %
LYMPHS ABS: 1.3 10*3/uL (ref 0.7–3.1)
Lymphs: 19 %
MCH: 32.2 pg (ref 26.6–33.0)
MCHC: 35.6 g/dL (ref 31.5–35.7)
MCV: 91 fL (ref 79–97)
MONOCYTES: 10 %
Monocytes Absolute: 0.7 10*3/uL (ref 0.1–0.9)
NEUTROS ABS: 4.4 10*3/uL (ref 1.4–7.0)
Neutrophils: 66 %
Platelets: 298 10*3/uL (ref 150–379)
RBC: 4.13 x10E6/uL (ref 3.77–5.28)
RDW: 12.9 % (ref 12.3–15.4)
WBC: 6.7 10*3/uL (ref 3.4–10.8)

## 2017-11-24 LAB — HEPATIC FUNCTION PANEL
ALK PHOS: 150 IU/L — AB (ref 39–117)
ALT: 14 IU/L (ref 0–32)
AST: 21 IU/L (ref 0–40)
Albumin: 4.5 g/dL (ref 3.5–4.7)
BILIRUBIN TOTAL: 0.7 mg/dL (ref 0.0–1.2)
BILIRUBIN, DIRECT: 0.18 mg/dL (ref 0.00–0.40)
Total Protein: 7.3 g/dL (ref 6.0–8.5)

## 2017-11-24 LAB — BASIC METABOLIC PANEL
BUN / CREAT RATIO: 10 — AB (ref 12–28)
BUN: 13 mg/dL (ref 8–27)
CHLORIDE: 99 mmol/L (ref 96–106)
CO2: 25 mmol/L (ref 20–29)
Calcium: 10.2 mg/dL (ref 8.7–10.3)
Creatinine, Ser: 1.35 mg/dL — ABNORMAL HIGH (ref 0.57–1.00)
GFR, EST AFRICAN AMERICAN: 42 mL/min/{1.73_m2} — AB (ref >59)
GFR, EST NON AFRICAN AMERICAN: 37 mL/min/{1.73_m2} — AB (ref >59)
GLUCOSE: 95 mg/dL (ref 65–99)
POTASSIUM: 4.1 mmol/L (ref 3.5–5.2)
Sodium: 139 mmol/L (ref 134–144)

## 2017-11-24 LAB — LIPID PANEL
Chol/HDL Ratio: 2.5 ratio (ref 0.0–4.4)
Cholesterol, Total: 160 mg/dL (ref 100–199)
HDL: 63 mg/dL (ref >39)
LDL Calculated: 84 mg/dL (ref 0–99)
Triglycerides: 66 mg/dL (ref 0–149)
VLDL CHOLESTEROL CAL: 13 mg/dL (ref 5–40)

## 2017-11-24 LAB — HEMOGLOBIN A1C
Est. average glucose Bld gHb Est-mCnc: 114 mg/dL
Hgb A1c MFr Bld: 5.6 % (ref 4.8–5.6)

## 2017-11-24 LAB — BRAIN NATRIURETIC PEPTIDE: BNP: 30 pg/mL (ref 0.0–100.0)

## 2017-11-24 MED ORDER — HYDROCODONE-ACETAMINOPHEN 5-325 MG PO TABS: 1.0000 | ORAL_TABLET | ORAL | 0 refills | Status: DC | PRN

## 2017-11-24 MED ORDER — CHLORZOXAZONE 500 MG PO TABS: 500.0000 mg | ORAL_TABLET | Freq: Three times a day (TID) | ORAL | 0 refills | Status: DC | PRN

## 2017-11-24 MED ORDER — GABAPENTIN 300 MG PO CAPS: 300.0000 mg | ORAL_CAPSULE | Freq: Two times a day (BID) | ORAL | 2 refills | Status: DC

## 2017-11-24 NOTE — Patient Instructions (Signed)
Start with Gapapentin 300 mg once daily; if needed may increase to one twice a day after a week

## 2017-11-24 NOTE — Progress Notes (Signed)
Subjective:  Presents for recheck of her back pain. Now having more pain in the left low back area and left hip. Does not go down the leg but having great difficulty getting out of bed and standing. Using a cane but this is inadequate. Now left leg is giving way at times. Putting more weight on her left leg due to right low back pain. Physical therapy has been scheduled as planned but she does not feel she can go at this time due to her level of pain. Minimal relief with Biofreeze or Lidocaine patches. Pain is severe at times. Having difficulty sleeping. Has taken Gabapentin without difficulty in the past.   Objective:   BP 128/68   Ht 5' 7.5" (1.715 m)   Wt 144 lb 0.4 oz (65.3 kg)   BMI 22.22 kg/m  NAD. Alert, oriented. Significant tenderness noted with palpation of the left lumbar area into the left hip. SLR right improved; SLR left positive. Reflexes normal lower extremities. Gait slow. Using cane for balance.   Assessment:   Problem List Items Addressed This Visit      Other   Acute right-sided low back pain with right-sided sciatica - Primary   Relevant Medications   HYDROcodone-acetaminophen (NORCO/VICODIN) 5-325 MG tablet   chlorzoxazone (PARAFON) 500 MG tablet   History of stroke   Unsteady gait    Other Visit Diagnoses    Acute left-sided low back pain without sciatica       Relevant Medications   HYDROcodone-acetaminophen (NORCO/VICODIN) 5-325 MG tablet   chlorzoxazone (PARAFON) 500 MG tablet       Plan:   Meds ordered this encounter  Medications  . HYDROcodone-acetaminophen (NORCO/VICODIN) 5-325 MG tablet    Sig: Take 1 tablet by mouth every 4 (four) hours as needed.    Dispense:  30 tablet    Refill:  0    Order Specific Question:   Supervising Provider    Answer:   Mikey Kirschner [[id number]]  . chlorzoxazone (PARAFON) 500 MG tablet    Sig: Take 1 tablet (500 mg total) by mouth 3 (three) times daily as needed for muscle spasms.    Dispense:  30 tablet    Refill:  0     Order Specific Question:   Supervising Provider    Answer:   Mikey Kirschner [[id number]]  . gabapentin (NEURONTIN) 300 MG capsule    Sig: Take 1 capsule (300 mg total) by mouth 2 (two) times daily.    Dispense:  60 capsule    Refill:  2    Order Specific Question:   Supervising Provider    Answer:   Mikey Kirschner [[id number]]   Use pain med and muscle relaxant cautiously due to drowsiness potential. Reviewed the paper chart. Last took Gabapentin in 2011. Restart with 300 mg once a day. May increase to BID after one week if needed. Will press forward with neurosurgical referral in case it is needed, mainly for possible pain intervention. Given written Rx for a 4 wheeled walker with a seat for safety reasons and to help with balance.  Call back next week if no improvement in pain. Our goal is to get her pain under control so she can begin PT.  25 minutes was spent with the patient.  This statement verifies that 25 minutes was indeed spent with the patient. Greater than half the time was spent in discussion, counseling and answering questions  regarding the issues that the patient came in for today as  reflected in the diagnosis (s) please refer to documentation for further details.

## 2017-11-24 NOTE — Telephone Encounter (Signed)
ERROR

## 2017-11-27 ENCOUNTER — Other Ambulatory Visit: Payer: Self-pay

## 2017-11-27 ENCOUNTER — Encounter (HOSPITAL_COMMUNITY): Payer: Self-pay | Admitting: Emergency Medicine

## 2017-11-27 ENCOUNTER — Emergency Department (HOSPITAL_COMMUNITY)
Admission: EM | Admit: 2017-11-27 | Discharge: 2017-11-28 | Disposition: A | Discharge status: Home / Self Care | Payer: Medicare Other | Attending: Emergency Medicine | Admitting: Emergency Medicine

## 2017-11-27 DIAGNOSIS — Z8673 Personal history of transient ischemic attack (TIA), and cerebral infarction without residual deficits: Secondary | ICD-10-CM | POA: Diagnosis not present

## 2017-11-27 DIAGNOSIS — I10 Essential (primary) hypertension: Secondary | ICD-10-CM | POA: Insufficient documentation

## 2017-11-27 DIAGNOSIS — R600 Localized edema: Secondary | ICD-10-CM | POA: Diagnosis not present

## 2017-11-27 DIAGNOSIS — R339 Retention of urine, unspecified: Secondary | ICD-10-CM | POA: Diagnosis not present

## 2017-11-27 DIAGNOSIS — Y929 Unspecified place or not applicable: Secondary | ICD-10-CM | POA: Diagnosis not present

## 2017-11-27 DIAGNOSIS — S3210XA Unspecified fracture of sacrum, initial encounter for closed fracture: Secondary | ICD-10-CM | POA: Insufficient documentation

## 2017-11-27 DIAGNOSIS — M7989 Other specified soft tissue disorders: Secondary | ICD-10-CM | POA: Diagnosis not present

## 2017-11-27 DIAGNOSIS — M545 Low back pain, unspecified: Secondary | ICD-10-CM

## 2017-11-27 DIAGNOSIS — Y999 Unspecified external cause status: Secondary | ICD-10-CM | POA: Insufficient documentation

## 2017-11-27 DIAGNOSIS — Z79899 Other long term (current) drug therapy: Secondary | ICD-10-CM | POA: Insufficient documentation

## 2017-11-27 DIAGNOSIS — Z7901 Long term (current) use of anticoagulants: Secondary | ICD-10-CM | POA: Insufficient documentation

## 2017-11-27 DIAGNOSIS — S3982XA Other specified injuries of lower back, initial encounter: Secondary | ICD-10-CM | POA: Diagnosis present

## 2017-11-27 DIAGNOSIS — S3992XA Unspecified injury of lower back, initial encounter: Secondary | ICD-10-CM | POA: Diagnosis not present

## 2017-11-27 DIAGNOSIS — Y9389 Activity, other specified: Secondary | ICD-10-CM | POA: Diagnosis not present

## 2017-11-27 DIAGNOSIS — W010XXA Fall on same level from slipping, tripping and stumbling without subsequent striking against object, initial encounter: Secondary | ICD-10-CM | POA: Diagnosis not present

## 2017-11-27 DIAGNOSIS — S299XXA Unspecified injury of thorax, initial encounter: Secondary | ICD-10-CM | POA: Diagnosis not present

## 2017-11-27 DIAGNOSIS — R270 Ataxia, unspecified: Secondary | ICD-10-CM | POA: Diagnosis not present

## 2017-11-27 HISTORY — DX: Sciatica, unspecified side: M54.30

## 2017-11-27 HISTORY — DX: Other chronic pain: G89.29

## 2017-11-27 HISTORY — DX: Dorsalgia, unspecified: M54.9

## 2017-11-27 LAB — URINALYSIS, ROUTINE W REFLEX MICROSCOPIC
Bilirubin Urine: NEGATIVE
Glucose, UA: NEGATIVE mg/dL
HGB URINE DIPSTICK: NEGATIVE
Ketones, ur: NEGATIVE mg/dL
Leukocytes, UA: NEGATIVE
Nitrite: NEGATIVE
PROTEIN: NEGATIVE mg/dL
Specific Gravity, Urine: 1.004 — ABNORMAL LOW (ref 1.005–1.030)
pH: 6 (ref 5.0–8.0)

## 2017-11-27 MED ORDER — HYDROCODONE-ACETAMINOPHEN 5-325 MG PO TABS
1.0000 | ORAL_TABLET | Freq: Once | ORAL | Status: AC
  Administered 2017-11-27: 1 via ORAL
  Filled 2017-11-27: qty 1

## 2017-11-27 MED ORDER — DIAZEPAM 5 MG PO TABS
2.5000 mg | ORAL_TABLET | Freq: Once | ORAL | Status: AC
  Administered 2017-11-27: 2.5 mg via ORAL
  Filled 2017-11-27: qty 1

## 2017-11-27 NOTE — ED Triage Notes (Signed)
Pt c/o bilateral legs giving out while walking x one month. Pt c/o left leg swelling x 3 days. Pt has seen pcp for the same and needs an appt with neurosurgeon.

## 2017-11-27 NOTE — ED Notes (Signed)
Post void residual: 740ml

## 2017-11-28 ENCOUNTER — Emergency Department (HOSPITAL_COMMUNITY): Payer: Medicare Other

## 2017-11-28 DIAGNOSIS — W010XXA Fall on same level from slipping, tripping and stumbling without subsequent striking against object, initial encounter: Secondary | ICD-10-CM | POA: Diagnosis not present

## 2017-11-28 DIAGNOSIS — Y9389 Activity, other specified: Secondary | ICD-10-CM | POA: Diagnosis not present

## 2017-11-28 DIAGNOSIS — Z8673 Personal history of transient ischemic attack (TIA), and cerebral infarction without residual deficits: Secondary | ICD-10-CM | POA: Diagnosis not present

## 2017-11-28 DIAGNOSIS — R600 Localized edema: Secondary | ICD-10-CM | POA: Diagnosis not present

## 2017-11-28 DIAGNOSIS — Y999 Unspecified external cause status: Secondary | ICD-10-CM | POA: Diagnosis not present

## 2017-11-28 DIAGNOSIS — M7989 Other specified soft tissue disorders: Secondary | ICD-10-CM | POA: Diagnosis not present

## 2017-11-28 DIAGNOSIS — S299XXA Unspecified injury of thorax, initial encounter: Secondary | ICD-10-CM | POA: Diagnosis not present

## 2017-11-28 DIAGNOSIS — Z79899 Other long term (current) drug therapy: Secondary | ICD-10-CM | POA: Diagnosis not present

## 2017-11-28 DIAGNOSIS — Y929 Unspecified place or not applicable: Secondary | ICD-10-CM | POA: Diagnosis not present

## 2017-11-28 DIAGNOSIS — Z7901 Long term (current) use of anticoagulants: Secondary | ICD-10-CM | POA: Diagnosis not present

## 2017-11-28 DIAGNOSIS — S3210XA Unspecified fracture of sacrum, initial encounter for closed fracture: Secondary | ICD-10-CM | POA: Diagnosis not present

## 2017-11-28 DIAGNOSIS — S3992XA Unspecified injury of lower back, initial encounter: Secondary | ICD-10-CM | POA: Diagnosis not present

## 2017-11-28 DIAGNOSIS — S3982XA Other specified injuries of lower back, initial encounter: Secondary | ICD-10-CM | POA: Diagnosis present

## 2017-11-28 DIAGNOSIS — I10 Essential (primary) hypertension: Secondary | ICD-10-CM | POA: Diagnosis not present

## 2017-11-28 DIAGNOSIS — M545 Low back pain: Secondary | ICD-10-CM | POA: Diagnosis not present

## 2017-11-28 DIAGNOSIS — R270 Ataxia, unspecified: Secondary | ICD-10-CM | POA: Diagnosis not present

## 2017-11-28 LAB — COMPREHENSIVE METABOLIC PANEL
ALT: 16 U/L (ref 14–54)
AST: 27 U/L (ref 15–41)
Albumin: 3.5 g/dL (ref 3.5–5.0)
Alkaline Phosphatase: 136 U/L — ABNORMAL HIGH (ref 38–126)
Anion gap: 10 (ref 5–15)
BUN: 21 mg/dL — ABNORMAL HIGH (ref 6–20)
CHLORIDE: 96 mmol/L — AB (ref 101–111)
CO2: 29 mmol/L (ref 22–32)
CREATININE: 1.34 mg/dL — AB (ref 0.44–1.00)
Calcium: 9.8 mg/dL (ref 8.9–10.3)
GFR calc Af Amer: 42 mL/min — ABNORMAL LOW (ref >60)
GFR calc non Af Amer: 36 mL/min — ABNORMAL LOW (ref >60)
Glucose, Bld: 118 mg/dL — ABNORMAL HIGH (ref 65–99)
Potassium: 3.4 mmol/L — ABNORMAL LOW (ref 3.5–5.1)
Sodium: 135 mmol/L (ref 135–145)
Total Bilirubin: 1.2 mg/dL (ref 0.3–1.2)
Total Protein: 7.4 g/dL (ref 6.5–8.1)

## 2017-11-28 LAB — CBC WITH DIFFERENTIAL/PLATELET
BASOS ABS: 0 10*3/uL (ref 0.0–0.1)
Basophils Relative: 1 %
EOS ABS: 0.4 10*3/uL (ref 0.0–0.7)
EOS PCT: 5 %
HCT: 36.8 % (ref 36.0–46.0)
Hemoglobin: 12.3 g/dL (ref 12.0–15.0)
LYMPHS PCT: 20 %
Lymphs Abs: 1.5 10*3/uL (ref 0.7–4.0)
MCH: 31.2 pg (ref 26.0–34.0)
MCHC: 33.4 g/dL (ref 30.0–36.0)
MCV: 93.4 fL (ref 78.0–100.0)
Monocytes Absolute: 0.8 10*3/uL (ref 0.1–1.0)
Monocytes Relative: 11 %
Neutro Abs: 4.6 10*3/uL (ref 1.7–7.7)
Neutrophils Relative %: 63 %
PLATELETS: 278 10*3/uL (ref 150–400)
RBC: 3.94 MIL/uL (ref 3.87–5.11)
RDW: 12 % (ref 11.5–15.5)
WBC: 7.3 10*3/uL (ref 4.0–10.5)

## 2017-11-28 LAB — BRAIN NATRIURETIC PEPTIDE: B Natriuretic Peptide: 143 pg/mL — ABNORMAL HIGH (ref 0.0–100.0)

## 2017-11-28 MED ORDER — METHOCARBAMOL 500 MG PO TABS
500.0000 mg | ORAL_TABLET | Freq: Once | ORAL | Status: AC
  Administered 2017-11-28: 500 mg via ORAL
  Filled 2017-11-28: qty 1

## 2017-11-28 MED ORDER — HYDROCODONE-ACETAMINOPHEN 5-325 MG PO TABS
1.0000 | ORAL_TABLET | Freq: Once | ORAL | Status: AC
  Administered 2017-11-28: 1 via ORAL
  Filled 2017-11-28: qty 1

## 2017-11-28 MED ORDER — DIAZEPAM 5 MG PO TABS
5.0000 mg | ORAL_TABLET | Freq: Once | ORAL | Status: DC
Start: 2017-11-28 — End: 2017-11-28

## 2017-11-28 MED ORDER — MORPHINE SULFATE (PF) 4 MG/ML IV SOLN
4.0000 mg | Freq: Once | INTRAVENOUS | Status: AC
  Administered 2017-11-28: 4 mg via INTRAVENOUS
  Filled 2017-11-28: qty 1

## 2017-11-28 NOTE — ED Notes (Signed)
Pure wick placed on pt, pt able to urinate, post void residual: 24ml

## 2017-11-28 NOTE — Discharge Instructions (Addendum)
We saw you in the ER for severe back pain and poor balance.  You had an MRI done of your spine which shows disc disease and resultant arthritis.  We also noted that you have a small fracture in your tailbone.  However, your spinal cord looks healthy and there is no medical treatment needed at this time.  It seems like your main issue appears to be pain control.  You are supposed to see a neurosurgeon for possible injections to the spine for pain control.  If that does not work you might have to see a pain specialist.  We consulted our case management and have ordered home health services for you, so that you can get further help at home.  It is prudent that you are careful with walking.  These be aware that pain medications and muscle relaxants will affect your balance and put you at high risk of falls.  Please ensure that your primary care doctor is well aware of your living situation at any point if you are getting worse consider coming back to the ER.

## 2017-11-28 NOTE — ED Notes (Signed)
Report to [facility], RN [facility] ED

## 2017-11-28 NOTE — ED Notes (Signed)
Patient transported to MRI 

## 2017-11-28 NOTE — Progress Notes (Deleted)
Cardiology Office Note   Date:  11/28/2017   ID:  Becky Baird, DOB March 07, 1934, MRN [id number]  PCP:  Kathyrn Drown, MD  Cardiologist:   Minus Breeding, MD    No chief complaint on file.     History of Present Illness: Becky Baird is a 84 y.o. female who presents for follow up of PAF and previous stroke.  Since I last saw her ***    she has done well.  She does her chores of daily living and goes to [employer].  The patient denies any new symptoms such as chest discomfort, neck or arm discomfort. There has been no new shortness of breath, PND or orthopnea. There have been no reported palpitations, presyncope or syncope.  She is limited by back and joint pain.    Past Medical History:  Diagnosis Date  . Anxiety   . Chronic back pain   . Hyperlipidemia   . Hypertension   . Memory loss   . Mild cognitive impairment, so stated   . Occlusion and stenosis of carotid artery with cerebral infarction    Bilateral less than 50% carotid stenosis.  . Persistent atrial fibrillation ([place]) 06/23/2016  . Renal cyst   . Sciatica   . Stroke ([place])    2010, RMCA with stent  . Toe fracture 11/2015   R toe  . Ulcer 1970s    Past Surgical History:  Procedure Laterality Date  . COLONOSCOPY  03/10/2011   Procedure: COLONOSCOPY;  Surgeon: Rogene Houston, MD;  [facility];  Service: Endoscopy;  Laterality: N/A;  . COLONOSCOPY N/A 08/24/2016   Procedure: COLONOSCOPY;  Surgeon: Rogene Houston, MD;  [facility];  Service: Endoscopy;  Laterality: N/A;  930  . DILATION AND CURETTAGE OF UTERUS     years ago  . OVARIAN CYST REMOVAL     at least 10 years ago  . POLYPECTOMY  08/24/2016   Procedure: POLYPECTOMY;  Surgeon: Rogene Houston, MD;  [facility];  Service: Endoscopy;;  ascending colon; hepatic flexure  . TONSILLECTOMY     childhood     Current Outpatient Medications  Medication Sig Dispense Refill  . ALPRAZolam (XANAX) 0.5 MG tablet  TAKE 1/2 TABLET BY MOUTH EVERY 6 HOURS AS NEEDED FOR ANXIETY AND TAKE 1/2 TO 1 TABLET AT BEDTIME AS NEEDED FOR SLEEP 45 tablet 5  . carboxymethylcellulose (REFRESH PLUS) 0.5 % SOLN Place 1 drop into both eyes daily as needed (dry eyes).    . chlorzoxazone (PARAFON) 500 MG tablet Take 1 tablet (500 mg total) by mouth 3 (three) times daily as needed for muscle spasms. 30 tablet 0  . Cholecalciferol 2000 UNITS CAPS Take 2,000 Units by mouth every evening.     . clobetasol ointment (TEMOVATE) 3.15 % Apply 1 application topically 2 (two) times daily as needed.    Marland Kitchen estradiol (ESTRACE) 0.1 MG/GM vaginal cream Place 1 Applicatorful vaginally once a week.    . fish oil-omega-3 fatty acids 1000 MG capsule Take 2 g by mouth every morning.     . gabapentin (NEURONTIN) 300 MG capsule Take 1 capsule (300 mg total) by mouth 2 (two) times daily. 60 capsule 2  . hydrochlorothiazide (HYDRODIURIL) 25 MG tablet TAKE ONE TABLET BY MOUTH DAILY 30 tablet 12  . HYDROcodone-acetaminophen (NORCO/VICODIN) 5-325 MG tablet Take 1 tablet by mouth every 4 (four) hours as needed. 30 tablet 0  . Iron-FA-B Cmp-C-Biot-Probiotic (FUSION PLUS) CAPS Take  1 capsule by mouth every morning.   3  . loratadine (CLARITIN) 10 MG tablet Take 1 tablet (10 mg total) by mouth daily. (Patient taking differently: Take 10 mg by mouth at bedtime. ) 30 tablet 5  . OVER THE COUNTER MEDICATION Take 1 capsule by mouth every morning. [place]    . pantoprazole (PROTONIX) 40 MG tablet TAKE ONE TABLET BY MOUTH DAILY. 30 tablet 5  . potassium chloride (K-DUR,KLOR-CON) 10 MEQ tablet TAKE ONE TABLET BY MOUTH TWICE DAILY 60 tablet 5  . Red Yeast Rice 600 MG CAPS Take 1,200 mg by mouth every morning.     . Rivaroxaban (XARELTO) 15 MG TABS tablet Take 1 tablet (15 mg total) by mouth daily with supper. 21 tablet 0  . sodium chloride (OCEAN) 0.65 % nasal spray Place 1 spray into the nose as needed for congestion.     No current facility-administered  medications for this visit.     Allergies:   Amlodipine and Niaspan [niacin er]     ROS:  Please see the history of present illness.   Otherwise, review of systems are positive for ***.   All other systems are reviewed and negative.    PHYSICAL EXAM: VS:  There were no vitals taken for this visit. , BMI There is no height or weight on file to calculate BMI.  GENERAL:  Well appearing NECK:  No jugular venous distention, waveform within normal limits, carotid upstroke brisk and symmetric, no bruits, no thyromegaly LUNGS:  Clear to auscultation bilaterally CHEST:  Unremarkable HEART:  PMI not displaced or sustained,S1 and S2 within normal limits, no S3, no S4, no clicks, no rubs, *** murmurs ABD:  Flat, positive bowel sounds normal in frequency in pitch, no bruits, no rebound, no guarding, no midline pulsatile mass, no hepatomegaly, no splenomegaly EXT:  2 plus pulses throughout, no edema, no cyanosis no clubbing   GENERAL:  Well appearing and in no distress NECK:  No jugular venous distention, waveform within normal limits, carotid upstroke brisk and symmetric, no bruits, no thyromegaly LUNGS:  Clear to auscultation bilaterally CHEST:  Unremarkable HEART:  PMI not displaced or sustained,S1 and S2 within normal limits, no S3, no S4, no clicks, no rubs, 3 out of 6 apical systolic murmur radiating slightly out the aortic outflow tract and up into the carotids, no diastolic murmurs ABD:  Flat, positive bowel sounds normal in frequency in pitch, no bruits, no rebound, no guarding, no midline pulsatile mass, no hepatomegaly, no splenomegaly EXT:  2 plus pulses throughout, no edema, no cyanosis no clubbing   EKG:  EKG is *** ordered today. The ekg ordered today demonstrates sinus bradycardia, rate ***, axis within normal limits, intervals within normal limits, no acute ST-T wave changes.   Recent Labs: 11/28/2017: ALT 16; B Natriuretic Peptide 143.0; BUN 21; Creatinine, Ser 1.34; Hemoglobin  12.3; Platelets 278; Potassium 3.4; Sodium 135    Lipid Panel    Component Value Date/Time   CHOL 160 11/23/2017 0930   TRIG 66 11/23/2017 0930   HDL 63 11/23/2017 0930   CHOLHDL 2.5 11/23/2017 0930   CHOLHDL 3.7 05/13/2016 0547   VLDL 21 05/13/2016 0547   LDLCALC 84 11/23/2017 0930      Wt Readings from Last 3 Encounters:  11/27/17 144 lb (65.3 kg)  11/24/17 144 lb 0.4 oz (65.3 kg)  11/22/17 145 lb 0.6 oz (65.8 kg)      Other studies Reviewed: Additional studies/ records that were reviewed today include: ***.  Review of the above records demonstrates:  ***   ASSESSMENT AND PLAN:   PAF:    Becky Baird has a CHA2DS2 - VASc score of 6.  *** She does not feel fibrillation. No change in therapy is indicated.  HTN:  ***  She reports she gets anxious and doctor's office. She's going to check her blood pressures at home. Most of the readings here have been within acceptable limits.  AORTIC STENOSIS:   ***   I reviewed the results of her echocardiogram done last year and she had normal LV function and mild aortic sclerosis/stenosis. This accounts for the murmur. No change in therapy is indicated.   Current medicines are reviewed at length with the patient today.  The patient does not have concerns regarding medicines.  The following changes have been made:  ***  Labs/ tests ordered today include: ***  No orders of the defined types were placed in this encounter.    Disposition:   FU with me in ***.     Signed, Minus Breeding, MD  11/28/2017 12:47 PM    [facility]

## 2017-11-28 NOTE — Discharge Planning (Signed)
Becky Baird J. Clydene Laming, RN, BSN, [employer] [phone] Spoke with pt at bedside regarding discharge planning for [facility]. Offered pt list of home health agencies to choose from.  Pt chose [facility] to render services. Burr Medico of [facility] notified. Patient made aware that [facility] will be in contact in 24 hours.  DME needs identified at this time include 3n1; Janae Sauce of [facility].

## 2017-11-28 NOTE — ED Notes (Signed)
Pt returned from MRI °

## 2017-11-28 NOTE — ED Notes (Signed)
Report to [facility], ETA 30 mins

## 2017-11-28 NOTE — ED Notes (Signed)
D/c reviewed with patient and family. DME arrived and taken to vehicle by family. No further questions

## 2017-11-28 NOTE — ED Notes (Signed)
Attempted to ambulate patient. She declined saying it was too painful even lifting her legs in bed. Family members says it will take a walker plus several people to assist patient attempting to walk. Pt states she uses either a cane or walker at home.

## 2017-11-28 NOTE — ED Provider Notes (Signed)
MSE was initiated and I personally evaluated the patient and placed orders (if any) at  5:48 AM on Nov 28, 2017.  The patient appears stable so that the remainder of the MSE may be completed by another provider.  Seen and examined, transferred from [facility] due to concern for epidural hematoma given anticoagulation. With leg and back pain.  NCAT PERRL Neck is supple RRR CTAB NABS, abdomen is soft, non tender Is lifting legs without difficulty   MRI pending signed out to am attending pending MRI   Fitzpatrick Alberico, MD 11/28/17 8032

## 2017-11-28 NOTE — ED Provider Notes (Signed)
[facility] EMERGENCY DEPARTMENT Provider Note   CSN: [id number] Arrival date & time: 11/27/17  1931     History   Chief Complaint Chief Complaint  Patient presents with  . Leg Swelling    HPI Becky Baird is a 84 y.o. female.  Patient reports ongoing right-sided sciatica type pain for the past 2 months.  She is seen her PCP multiple times.  She reports her legs "giving out" while she has been attempting to walk.  Has not been able to walk for the past 2 days due to pain going down both legs.  States she did fall today onto her knees on the porch but did not hit her head.  Patient states she fell backwards 1 week ago and try to catch herself with a chair but did not fall to the ground.  She describes worsening left-sided leg pain compared to the right side.  She did hit her head.  She denies any focal weakness, numbness, tingling.  No bowel or bladder incontinence.  She did have one episode this morning where she could make it to the bathroom on time.  Denies any IV drug abuse.  No history of cancer or fever.  No pain with urination or blood in the urine.  She does take Xarelto for history of atrial fibrillation.  The history is provided by the patient.    Past Medical History:  Diagnosis Date  . Anxiety   . Chronic back pain   . Hyperlipidemia   . Hypertension   . Memory loss   . Mild cognitive impairment, so stated   . Occlusion and stenosis of carotid artery with cerebral infarction    Bilateral less than 50% carotid stenosis.  . Persistent atrial fibrillation ([place]) 06/23/2016  . Renal cyst   . Sciatica   . Stroke ([place])    2010, RMCA with stent  . Toe fracture 11/2015   R toe  . Ulcer 1970s    Patient Active Problem List   Diagnosis Date Noted  . Acute right-sided low back pain with right-sided sciatica 11/24/2017  . Unsteady gait 11/24/2017  . Primary osteoarthritis of both hands 11/13/2017  . Paroxysmal atrial fibrillation ([place]) 11/23/2016  . Murmur 11/23/2016   . TIA (transient ischemic attack) 05/12/2016  . Altered mental status   . Urinary tract infection without hematuria   . History of colonic polyps 05/10/2016  . History of stroke 03/03/2015  . GERD (gastroesophageal reflux disease) 02/06/2015  . Aortic sclerosis 12/02/2014  . Osteopenia 01/14/2014  . Prediabetes 08/05/2013  . Hypercalcemia 04/22/2013  . Renal insufficiency 02/15/2013  . Mild cognitive impairment 01/03/2013  . Memory loss 01/03/2013  . Ulcer 10/12/2012  . Hypertension 10/12/2012  . Hyperlipidemia 10/12/2012  . Cerebral infarction ([place]) 10/12/2012  . HIP PAIN, RIGHT 12/29/2009    Past Surgical History:  Procedure Laterality Date  . COLONOSCOPY  03/10/2011   Procedure: COLONOSCOPY;  Surgeon: Rogene Houston, MD;  [facility];  Service: Endoscopy;  Laterality: N/A;  . COLONOSCOPY N/A 08/24/2016   Procedure: COLONOSCOPY;  Surgeon: Rogene Houston, MD;  [facility];  Service: Endoscopy;  Laterality: N/A;  930  . DILATION AND CURETTAGE OF UTERUS     years ago  . OVARIAN CYST REMOVAL     at least 10 years ago  . POLYPECTOMY  08/24/2016   Procedure: POLYPECTOMY;  Surgeon: Rogene Houston, MD;  [facility];  Service: Endoscopy;;  ascending colon; hepatic flexure  .  TONSILLECTOMY     childhood     OB History   None      Home Medications    Prior to Admission medications   Medication Sig Start Date End Date Taking? Authorizing Provider  ALPRAZolam (XANAX) 0.5 MG tablet TAKE 1/2 TABLET BY MOUTH EVERY 6 HOURS AS NEEDED FOR ANXIETY AND TAKE 1/2 TO 1 TABLET AT BEDTIME AS NEEDED FOR SLEEP 04/07/17  Yes Luking, Elayne Snare, MD  carboxymethylcellulose (REFRESH PLUS) 0.5 % SOLN Place 1 drop into both eyes daily as needed (dry eyes).   Yes [provider]  chlorzoxazone (PARAFON) 500 MG tablet Take 1 tablet (500 mg total) by mouth 3 (three) times daily as needed for muscle spasms. 11/24/17  Yes Nilda Simmer, NP  Cholecalciferol  2000 UNITS CAPS Take 2,000 Units by mouth every evening.    Yes [provider]  clobetasol ointment (TEMOVATE) 7.10 % Apply 1 application topically 2 (two) times daily as needed.   Yes [provider]  estradiol (ESTRACE) 0.1 MG/GM vaginal cream Place 1 Applicatorful vaginally once a week.   Yes [provider]  fish oil-omega-3 fatty acids 1000 MG capsule Take 2 g by mouth every morning.    Yes [provider]  gabapentin (NEURONTIN) 300 MG capsule Take 1 capsule (300 mg total) by mouth 2 (two) times daily. 11/24/17  Yes Nilda Simmer, NP  hydrochlorothiazide (HYDRODIURIL) 25 MG tablet TAKE ONE TABLET BY MOUTH DAILY 09/07/17  Yes Kathyrn Drown, MD  HYDROcodone-acetaminophen (NORCO/VICODIN) 5-325 MG tablet Take 1 tablet by mouth every 4 (four) hours as needed. 11/24/17  Yes Nilda Simmer, NP  Iron-FA-B Cmp-C-Biot-Probiotic (FUSION PLUS) CAPS Take 1 capsule by mouth every morning.  05/12/14  Yes [provider]  loratadine (CLARITIN) 10 MG tablet Take 1 tablet (10 mg total) by mouth daily. Patient taking differently: Take 10 mg by mouth at bedtime.  02/06/15  Yes Luking, Elayne Snare, MD  OVER THE COUNTER MEDICATION Take 1 capsule by mouth every morning. SUPREMA DOPHILUS   Yes [provider]  pantoprazole (PROTONIX) 40 MG tablet TAKE ONE TABLET BY MOUTH DAILY. 06/05/17  Yes Kathyrn Drown, MD  potassium chloride (K-DUR,KLOR-CON) 10 MEQ tablet TAKE ONE TABLET BY MOUTH TWICE DAILY 06/05/17  Yes Luking, Scott A, MD  Red Yeast Rice 600 MG CAPS Take 1,200 mg by mouth every morning.    Yes [provider]  Rivaroxaban (XARELTO) 15 MG TABS tablet Take 1 tablet (15 mg total) by mouth daily with supper. 10/20/17  Yes Minus Breeding, MD  sodium chloride (OCEAN) 0.65 % nasal spray Place 1 spray into the nose as needed for congestion.    [provider]  potassium chloride (K-DUR) 10 MEQ tablet Take 1 tablet (10 mEq total) by mouth 2  (two) times daily. 05/22/13 10/12/13  Kathyrn Drown, MD    Family History Family History  Problem Relation Age of Onset  . Stroke Mother   . Heart failure Father   . Diabetes Brother     Social History Social History   Tobacco Use  . Smoking status: Never Smoker  . Smokeless tobacco: Never Used  Substance Use Topics  . Alcohol use: No    Alcohol/week: 0.0 oz  . Drug use: No     Allergies   Amlodipine and Niaspan [niacin er]   Review of Systems Review of Systems  Constitutional: Negative for activity change, appetite change and fever.  HENT: Negative for congestion and  rhinorrhea.   Eyes: Negative for visual disturbance.  Respiratory: Negative for cough, chest tightness and shortness of breath.   Cardiovascular: Negative for chest pain.  Gastrointestinal: Negative for abdominal pain, nausea and vomiting.  Genitourinary: Negative for dysuria, hematuria, vaginal bleeding and vaginal discharge.  Musculoskeletal: Positive for arthralgias, back pain and myalgias.  Skin: Negative for rash.  Neurological: Negative for dizziness, weakness and headaches.    all other systems are negative except as noted in the HPI and PMH.    Physical Exam Updated Vital Signs BP 134/71 (BP Location: Left Arm)   Pulse 73   Temp 98.3 F (36.8 C) (Oral)   Resp 17   Wt 65.3 kg (144 lb)   SpO2 97%   BMI 22.22 kg/m   Physical Exam  Constitutional: She is oriented to person, place, and time. She appears well-developed and well-nourished. No distress.  HENT:  Head: Normocephalic and atraumatic.  Mouth/Throat: Oropharynx is clear and moist. No oropharyngeal exudate.  Eyes: Pupils are equal, round, and reactive to light. Conjunctivae and EOM are normal.  Neck: Normal range of motion. Neck supple.  No meningismus.  Cardiovascular: Normal rate, regular rhythm, normal heart sounds and intact distal pulses.  No murmur heard. Pulmonary/Chest: Effort normal and breath sounds normal. No  respiratory distress. She exhibits no tenderness.  Abdominal: Soft. There is no tenderness. There is no rebound and no guarding.  Musculoskeletal: Normal range of motion. She exhibits tenderness. She exhibits no edema.  Paraspinal lumbar tenderness bilaterally and SI joint tenderness bilaterally  5/5 strength in bilateral lower extremities. Ankle plantar and dorsiflexion intact. Great toe extension intact bilaterally. +2 DP and PT pulses. +2 patellar reflexes bilaterally.   Patient unable to walk.  She requires 2 person assistance to stand.  Neurological: She is alert and oriented to person, place, and time. No cranial nerve deficit. She exhibits normal muscle tone. Coordination normal.  No ataxia on finger to nose bilaterally. No pronator drift. 5/5 strength throughout. CN 2-12 intact.Equal grip strength. Sensation intact.   Skin: Skin is warm. Capillary refill takes less than 2 seconds. No rash noted.  Psychiatric: She has a normal mood and affect. Her behavior is normal.  Nursing note and vitals reviewed.    ED Treatments / Results  Labs (all labs ordered are listed, but only abnormal results are displayed) Labs Reviewed  URINALYSIS, ROUTINE W REFLEX MICROSCOPIC - Abnormal; Notable for the following components:      Result Value   Color, Urine STRAW (*)    Specific Gravity, Urine 1.004 (*)    All other components within normal limits  COMPREHENSIVE METABOLIC PANEL - Abnormal; Notable for the following components:   Potassium 3.4 (*)    Chloride 96 (*)    Glucose, Bld 118 (*)    BUN 21 (*)    Creatinine, Ser 1.34 (*)    Alkaline Phosphatase 136 (*)    GFR calc non Af Amer 36 (*)    GFR calc Af Amer 42 (*)    All other components within normal limits  BRAIN NATRIURETIC PEPTIDE - Abnormal; Notable for the following components:   B Natriuretic Peptide 143.0 (*)    All other components within normal limits  CBC WITH DIFFERENTIAL/PLATELET    EKG None  Radiology Dg Lumbar  Spine 2-3 Views  Result Date: 11/28/2017 CLINICAL DATA:  84 year old female with chronic back pain. EXAM: LUMBAR SPINE - 2-3 VIEW COMPARISON:  Lumbar spine MRI dated 11/17/2017 FINDINGS: A linear lucency in the  inferior articular process of the L3 appears to have corticated margins and likely chronic or related to degenerative changes. Clinical correlation is recommended. CT may provide better evaluation if there is high clinical concern for an acute fracture. There is no definite acute fracture or subluxation of the lumbar spine. The bones are osteopenic. Mild compression deformity of the superior endplates of D7-O2 noted. There is grade 1 L4-L5 and L3-L4 anterolisthesis. Multilevel facet hypertrophy. There is mild levocurvature of the spine. IMPRESSION: 1. No definite acute fracture or subluxation of the lumbar spine. 2. Linear lucency through the L3 inferior articular process, age indeterminate, likely chronic or related to degenerative changes. Clinical correlation is recommended. 3. Osteopenia with mild chronic appearing compression of the lower lumbar spine. Electronically Signed   By: Anner Crete M.D.   On: 11/28/2017 01:55   Dg Pelvis 1-2 Views  Result Date: 11/28/2017 CLINICAL DATA:  84 year old female with left leg swelling and chronic back pain. EXAM: PELVIS - 1-2 VIEW COMPARISON:  CT of the abdomen pelvis dated 11/12/2009. FINDINGS: There is no acute fracture or dislocation. The bones are osteopenic. Mild bilateral hip joint space narrowing. Lower lumbar degenerative changes noted. The soft tissues are grossly unremarkable. IMPRESSION: No acute fracture or dislocation. Electronically Signed   By: Anner Crete M.D.   On: 11/28/2017 01:50    Procedures Procedures (including critical care time)  Medications Ordered in ED Medications  diazepam (VALIUM) tablet 2.5 mg (2.5 mg Oral Given 11/27/17 2352)  HYDROcodone-acetaminophen (NORCO/VICODIN) 5-325 MG per tablet 1 tablet (1 tablet Oral  Given 11/27/17 2352)     Initial Impression / Assessment and Plan / ED Course  I have reviewed the triage vital signs and the nursing notes.  Pertinent labs & imaging results that were available during my care of the patient were reviewed by me and considered in my medical decision making (see chart for details).    Patient with worsening of her chronic right-sided back pain is now on the left side as well causing her to have weakness and difficulty walking.  She has no focal deficits on exam with intact strength, pulses and reflexes.     MRI from April 26 reviewed.  "Curvature convex to the left with the apex at L2. Facet degeneration with 2 mm anterolisthesis at L2-3, 3 mm anterolisthesis at L3-4 and 4 mm anterolisthesis L4-5. Despite that, there is no compressive central canal stenosis. Despite the fact that no definite nerve compression is established, neural irritation would be possible on the right at L2-3, L3-4 and L4-5."   Patient given pain medication in the ED.  She is unable to walk with two-person assistance.  X-ray today shows questionable lucency through inferior articular process of L3.  This would not explain patient's urinary retention and inability to walk. This was not seen on recent MRI.  His pain  Feel patient needs MRI to evaluate for possible epidural hematoma given her anticoagulation use and urinary retention.  MRI not available at this facility.  Discussed with Dr. Dina Rich who accepts patient to [facility].  Patient and family agreeable to transfer.   Final Clinical Impressions(s) / ED Diagnoses   Final diagnoses:  Lumbar back pain  Urinary retention    ED Discharge Orders    None       Alireza Pollack, Annie Main, MD 11/28/17 [phone]

## 2017-11-28 NOTE — ED Provider Notes (Addendum)
  Physical Exam  BP (!) 129/35 (BP Location: Left Arm) Comment: This BP was taken twice; first measurement 124/36; was taken again and found to be the same  Pulse 75 Comment: fluctuates between 25-75  Temp 98.3 F (36.8 C) (Oral)   Resp 16   Wt 65.3 kg (144 lb)   SpO2 98%   BMI 22.22 kg/m   Physical Exam  ED Course/Procedures     Procedures  MDM   [facility] for [facility].  Patient complains of bilateral legs that are giving out and weakness.  Symptoms have been present for about a month now. Patient sent to [facility] with concerns for cord compression versus epidural abscess.  MRI results are normal, besides degenerative disc disease and nonspecific sacral fracture with mild edema surrounding it.  No clinical concerns for cord compression.  Case management has been consulted to see if they can help with home health.  Varney Biles, MD 11/28/17 1334  3:23 PM Results from the ER workup discussed with the patient face to face and all questions answered to the best of my ability. MRI shows a sacral fracture without any cord issues. Case management has been consulted. They have recommended home health.  I just spoke with the patient, it seems like pain is the biggest factor that is affecting her lifestyle and ability to walk.  Patient is not in any pain at the moment.  I tried to ambulate her, and her pain does come about when she does so.  Patient is supposed to see neurosurgery for an injection for pain control.  Per case management request I will put in home health, PT, OT, RN and social work orders.  Patient and family made aware of the plan, and they seem accepting of the plan. Patient advised to be very careful with walking to prevent falls.  She is also been made aware that pain medicine and muscle relaxants can impair her ability to walk and put her at risk for falling.   Varney Biles, MD 11/28/17 1525

## 2017-11-29 ENCOUNTER — Ambulatory Visit: Payer: Medicare Other | Admitting: Cardiology

## 2017-11-29 ENCOUNTER — Other Ambulatory Visit: Payer: Self-pay | Admitting: *Deleted

## 2017-11-29 ENCOUNTER — Telehealth: Payer: Self-pay | Admitting: *Deleted

## 2017-11-29 NOTE — Telephone Encounter (Signed)
[name] called stating patient was seen in the [facility] ER and sent to [facility], they also set up home health for her. [name] stated she was told patient will need to see a neurosurgeon ASAP. Please call [name] at [phone].

## 2017-11-29 NOTE — Patient Outreach (Signed)
Made aware of [facility] First enrollment by [facility] with [place] to speak with patient's daughter [name] at [phone].  Spoke with [name] to make aware that [place] Management could assist if pharmacy needs or transportation needs are identified while on the [place] program. Also discussed,   If community case management needs are identified, [facility] Care Management will  receive referral after [place] completion .  [name] expresses appreciation and understanding.  Will notify [place] Management office of [facility] First enrollment.   Marthenia Rolling, MSN-Ed, RN,BSN [facility] Liaison [phone]

## 2017-11-29 NOTE — Telephone Encounter (Signed)
Called Becky Baird to verify that pt was not inpatient & to let her know that referral with all required documentation has been sent to [facility] & their office will call pt directly to schedule  Referral marked urgent for MD on call & notation made to contact Becky Baird to schedule

## 2017-11-30 ENCOUNTER — Telehealth: Payer: Self-pay | Admitting: *Deleted

## 2017-11-30 DIAGNOSIS — M19031 Primary osteoarthritis, right wrist: Secondary | ICD-10-CM | POA: Diagnosis not present

## 2017-11-30 DIAGNOSIS — M5431 Sciatica, right side: Secondary | ICD-10-CM | POA: Diagnosis not present

## 2017-11-30 NOTE — Telephone Encounter (Signed)
Please let patient know about their decision.

## 2017-11-30 NOTE — Telephone Encounter (Signed)
Chlorzoxazone 500mg  was denied by patient's insurance. This medication is not covered by patient's insurance

## 2017-11-30 NOTE — Telephone Encounter (Signed)
   [facility] Pre-operative Risk Assessment    Request for surgical clearance:  1. What type of surgery is being performed? Bilateral TFESI   2. When is this surgery scheduled? 12/08/17   3. What type of clearance is required (medical clearance vs. Pharmacy clearance to hold med vs. Both)? BOTH  4. Are there any medications that need to be held prior to surgery and how long?XARELTO 3-5 DAYS   5. Practice name and name of physician performing surgery? Marlaine Hind MD   6. What is your office phone number [phone]   7.   What is your office fax number [phone]  8.   Anesthesia type (None, local, MAC, general) ? NOT LISTED   Becky Baird 11/30/2017, 3:19 PM  _________________________________________________________________   (provider comments below)

## 2017-11-30 NOTE — Telephone Encounter (Signed)
Pt.notified

## 2017-12-01 ENCOUNTER — Ambulatory Visit (INDEPENDENT_AMBULATORY_CARE_PROVIDER_SITE_OTHER): Payer: Medicare Other | Admitting: Family Medicine

## 2017-12-01 ENCOUNTER — Telehealth: Payer: Self-pay | Admitting: Family Medicine

## 2017-12-01 DIAGNOSIS — M545 Low back pain: Secondary | ICD-10-CM | POA: Diagnosis not present

## 2017-12-01 DIAGNOSIS — S3210XD Unspecified fracture of sacrum, subsequent encounter for fracture with routine healing: Secondary | ICD-10-CM

## 2017-12-01 MED ORDER — HYDROCODONE-ACETAMINOPHEN 10-325 MG PO TABS: 1.0000 | ORAL_TABLET | ORAL | 0 refills | Status: DC | PRN

## 2017-12-01 NOTE — Telephone Encounter (Signed)
Patients daughter, [name], called and said that the neurosurgeon has advised that Becky Baird go directly to pain management.  [name] is requesting Dr. Nicki Reaper or Hoyle Sauer to call her back to discuss Dr. Brien Few.  She said she has some reservations about him and she is unsure of him.  She has heard about spinal abscesses and horror stories coming out of his office and she would like to discuss this.

## 2017-12-01 NOTE — Progress Notes (Signed)
20-minute discussion held with the daughter regarding the patient's sacral fracture pain management as well as referral management.  Stated by the daughter that the patient having significant low back pain difficult time moving around.  Hydrocodone not helping enough Having some drowsiness But not excessive with gabapentin  Has an appointment coming up with pain management with the neurosurgery that does injections We will manage the medications here Based on the drowsiness and the fact that this is more related to arthritic issues in the back as well as a sacrum fracture I do not recommend gabapentin therefore stop gabapentin We talked about again plan They will see pain management for injections we will follow-up here within 2 weeks they will follow-up sooner if any problems no physical exam was done today because this was a verbal consultation 15 to 20 minutes spent certainly with the patient would benefit from having a live-in aide as well as  Physical therapy Occupational Therapy Family may have to consider staying with her at nighttime

## 2017-12-01 NOTE — Telephone Encounter (Signed)
I had discussion with family regarding this please see consultation on this patient for today's date

## 2017-12-01 NOTE — Telephone Encounter (Signed)
   Primary Cardiologist:James Hochrein, MD  Chart reviewed as part of pre-operative protocol coverage. Because of Lyzette Reinhardt Mckendry's past medical history and time since last visit, he/she will require a follow-up visit in order to better assess preoperative cardiovascular risk. Patient has not been seen since 11/2016. She does have an appt coming up later this month.  Pre-op covering staff: - Please call patient to inform them. - Add preop clearance modifier to appt notes - Please contact requesting surgeon's office via preferred method (i.e, phone, fax) to inform them of need for appointment prior to surgery.  Charlie Pitter, PA-C  12/01/2017, 4:06 PM

## 2017-12-02 DIAGNOSIS — M19031 Primary osteoarthritis, right wrist: Secondary | ICD-10-CM | POA: Diagnosis not present

## 2017-12-02 DIAGNOSIS — M5431 Sciatica, right side: Secondary | ICD-10-CM | POA: Diagnosis not present

## 2017-12-03 NOTE — Telephone Encounter (Signed)
H patient is being seen by Dr. Brien Few this coming Friday-Family was seen last week to discuss pain management

## 2017-12-04 ENCOUNTER — Telehealth: Payer: Self-pay | Admitting: Family Medicine

## 2017-12-04 ENCOUNTER — Telehealth: Payer: Self-pay | Admitting: Cardiology

## 2017-12-04 DIAGNOSIS — M5431 Sciatica, right side: Secondary | ICD-10-CM | POA: Diagnosis not present

## 2017-12-04 DIAGNOSIS — M19031 Primary osteoarthritis, right wrist: Secondary | ICD-10-CM | POA: Diagnosis not present

## 2017-12-04 NOTE — Telephone Encounter (Signed)
Discussed with pt's daughter [name]. [name] verbalized understanding. She wanted rx faxed to [phone]. rx was faxed.

## 2017-12-04 NOTE — Telephone Encounter (Signed)
Patient doesn't want Dr. Brien Few to complete her injection this Friday.  Becky Baird has contact [facility] and was told that if we send over an order stating "Lumbar injection at radiologist discretion", they will do her injection.    Becky Baird wanted to let Dr. Nicki Reaper know that she appreciates everything that he does and she isn't trying to go against his wishes.  Aniesha just doesn't feel comfortable with Dr. Brien Few.

## 2017-12-04 NOTE — Telephone Encounter (Signed)
It would be fine to send in order for lumbar injection at radiologist discretion.  Please write out I will sign.  Also have family call Dr. Lauris Poag office to cancel the appointment-obviously if patient having further troubles or worsening troubles I would like to have her follow-up here

## 2017-12-04 NOTE — Telephone Encounter (Signed)
Returned the call to the patient's daughter, per the dpr. She stated that the patient is not having the surgery with Dr. Lauris Poag office. She will now be having a lumbar steroid injection which will take place at [place] on 5/20.   She stated that [place] has requested that the patient hold Xarelto for one day. A call has been placed and message left to [place] for something in writing to be faxed to the office that states specifically what they require.  Message has been left to call back.

## 2017-12-04 NOTE — Telephone Encounter (Signed)
I would not consider a fentanyl patch.  Next step I would consider Percocet.  We will be happy to see the patient in follow-up this week regarding this or can issue a short prescription for them to try

## 2017-12-04 NOTE — Telephone Encounter (Signed)
Called Becky Baird with [facility] and he states to hold off because he just found out they were cutting hydrocodone 10/325 in half and told them to try a whole tablet. He states they will see how that goes and call back if any issues. He goes back out on Thursday.

## 2017-12-04 NOTE — Telephone Encounter (Signed)
   [facility] Pre-operative Risk Assessment    Request for surgical clearance:  1. What type of surgery is being performed? EPIDURAL STEROID INJECTION    2. When is this surgery scheduled? TBD  3. What type of clearance is required (medical clearance vs. Pharmacy clearance to hold med vs. Both)? Hold meds  4. Are there any medications that need to be held prior to surgery and how long?XARELTO FOR 1 DAY   5. Practice name and name of physician performing surgery? [facility]    6. What is your office phone number? [phone]    7.   What is your office fax number?   [phone]  8.   Anesthesia type (None, local, MAC, general) ?

## 2017-12-04 NOTE — Telephone Encounter (Signed)
Follow Up:    Please call,daughter is confused about pt's clearance for surgery.She thought pt was cleared for surgery,does she need an appt with [name] before her surgery?

## 2017-12-04 NOTE — Telephone Encounter (Signed)
ok-FYI-I do not recommend fentanyl patch-if hydrocodone does not help we can try oxycodone

## 2017-12-04 NOTE — Telephone Encounter (Signed)
Becky Baird with [facility] is having a visit with Katesha now.  He says that her pain is still not being managed.  Would you consider a fentanyl patch?

## 2017-12-04 NOTE — Telephone Encounter (Signed)
Spoke with pt re: Cardiac Clearance. Pt advised me to cancel this request, as that she was trying to find another DR, that she didn't want to go to this office that we received this surgical clearance request from. Pt aware that it is voided. She thanked me for the call.  I will close this encounter, per pt.

## 2017-12-05 DIAGNOSIS — M19031 Primary osteoarthritis, right wrist: Secondary | ICD-10-CM | POA: Diagnosis not present

## 2017-12-05 DIAGNOSIS — M5431 Sciatica, right side: Secondary | ICD-10-CM | POA: Diagnosis not present

## 2017-12-06 ENCOUNTER — Other Ambulatory Visit: Payer: Self-pay | Admitting: Family Medicine

## 2017-12-06 NOTE — Telephone Encounter (Signed)
Pt takes Xarelto for afib with CHADS2VASc score of 7 (age x2, sex, HTN, CAD, stroke). SCr 1.34, CrCl 1mL/min.   Request is to hold Xarelto for 1 day prior to [facility], we typically hold for 3 days prior to spinal procedures per protocol. However, with patient's elevated cardiac risk including prior stroke, will defer to MD regarding appropriate duration to hold Xarelto prior to [facility].

## 2017-12-06 NOTE — Telephone Encounter (Signed)
The patient is due for follow up.  I would suggest holding off on the spinal injection until I can see her in the clinic.  She is at risk for holding Xarelto and I would like to have this discussion with her.

## 2017-12-07 ENCOUNTER — Telehealth: Payer: Self-pay

## 2017-12-07 ENCOUNTER — Other Ambulatory Visit: Payer: Self-pay | Admitting: *Deleted

## 2017-12-07 ENCOUNTER — Ambulatory Visit (INDEPENDENT_AMBULATORY_CARE_PROVIDER_SITE_OTHER): Payer: Medicare Other | Admitting: Cardiology

## 2017-12-07 ENCOUNTER — Telehealth: Payer: Self-pay | Admitting: Family Medicine

## 2017-12-07 ENCOUNTER — Encounter: Payer: Self-pay | Admitting: Cardiology

## 2017-12-07 VITALS — BP 126/54 | HR 54 | Ht 67.5 in | Wt 135.0 lb

## 2017-12-07 DIAGNOSIS — R001 Bradycardia, unspecified: Secondary | ICD-10-CM

## 2017-12-07 DIAGNOSIS — M19031 Primary osteoarthritis, right wrist: Secondary | ICD-10-CM | POA: Diagnosis not present

## 2017-12-07 DIAGNOSIS — I10 Essential (primary) hypertension: Secondary | ICD-10-CM | POA: Diagnosis not present

## 2017-12-07 DIAGNOSIS — I480 Paroxysmal atrial fibrillation: Secondary | ICD-10-CM | POA: Diagnosis not present

## 2017-12-07 DIAGNOSIS — I350 Nonrheumatic aortic (valve) stenosis: Secondary | ICD-10-CM

## 2017-12-07 DIAGNOSIS — M5431 Sciatica, right side: Secondary | ICD-10-CM | POA: Diagnosis not present

## 2017-12-07 MED ORDER — HYDROCODONE-ACETAMINOPHEN 10-325 MG PO TABS: 1.0000 | ORAL_TABLET | ORAL | 0 refills | Status: DC | PRN

## 2017-12-07 NOTE — Telephone Encounter (Signed)
Dr. Percival Spanish to see patient today. He will address clearance and xarelto in his progress note.

## 2017-12-07 NOTE — Telephone Encounter (Signed)
May refill the pain medicine in the future please try to give [place] 24 to 48 hours notice when needing additional pain medicine

## 2017-12-07 NOTE — Patient Instructions (Signed)
Medication Instructions:  HOLD- Xarelto 3 days prior to spinal injection  If you need a refill on your cardiac medications before your next appointment, please call your pharmacy.  Labwork: None Ordered   Testing/Procedures: None Ordered   Follow-Up: Your physician wants you to follow-up in: 1 Year. You should receive a reminder letter in the mail two months in advance. If you do not receive a letter, please call our office [phone].      Thank you for choosing [facility] at [facility]!!

## 2017-12-07 NOTE — Telephone Encounter (Signed)
Patient requesting refill on hydrocodone 10/325

## 2017-12-07 NOTE — Telephone Encounter (Signed)
Received call from pt daughter very upset as her mom needs cardiac clearance and approval to HOLD her Xarelto for 1 day prior to her spinal procedure, tentatively scheduled on Monday 5/20 with [facility] Radiology. Daughter explained that mom has gone from being independent to being completely dependent, needing [place], PT, and sitters 24 hours a day as she has minimal movement out of the bed.   Daughter is insistent that we work to get pt in today or tomorrow so her mom does not have to live in this pain longer then she has to.  Was able to get pt scheduled with Dr. Percival Spanish today 5/16 @ 3:20pm. [facility], Angelita Ingles, to make her aware pt has appt today and we should have an answer for her by tomorrow.  Daughter aware of plan no additional questions at this time.

## 2017-12-07 NOTE — Progress Notes (Signed)
Cardiology Office Note   Date:  12/07/2017   ID:  Becky Baird, DOB 20-Dec-1933, MRN [id number]  PCP:  Kathyrn Drown, MD  Cardiologist:   Minus Breeding, MD    Chief Complaint  Patient presents with  . Atrial Fibrillation      History of Present Illness: Becky Baird is a 84 y.o. female who presents for follow up of PAF and previous stroke.  Since I last saw her she had some foot injury and now she thinks that led to the back injury that is requiring action.  She needed to stop her Xarelto but I wanted to review her risks with her.  She has had a previous stroke.  I tried to go back and look at the 2010 records but could not find specific description but it very much seems on talking to her that this was not an embolic event.  This was rather a fixed stenosis.  She is not actively in atrial fibrillation.  She has not felt any tachypalpitations.  Very limited because of the back pain.  She denies any new shortness of breath, PND or orthopnea.  She has fatigue.  She has some lower extremity swelling that started before her back problem but has progressed.  Past Medical History:  Diagnosis Date  . Anxiety   . Chronic back pain   . Hyperlipidemia   . Hypertension   . Memory loss   . Mild cognitive impairment, so stated   . Occlusion and stenosis of carotid artery with cerebral infarction    Bilateral less than 50% carotid stenosis.  . Persistent atrial fibrillation ([place]) 06/23/2016  . Renal cyst   . Sciatica   . Stroke ([place])    2010, RMCA with stent  . Toe fracture 11/2015   R toe  . Ulcer 1970s    Past Surgical History:  Procedure Laterality Date  . COLONOSCOPY  03/10/2011   Procedure: COLONOSCOPY;  Surgeon: Rogene Houston, MD;  Location: [facility] SU[facility];  Service: Endoscopy;  Laterality: N/A;  . COLONOSCOPY N/A 08/24/2016   Procedure: COLONOSCOPY;  Surgeon: Rogene Houston, MD;  Location: [facility] END[facility] SUITE;  Service: Endoscopy;  Laterality: N/A;  930  . DILATION AND  CURETTAGE OF UTERUS     years ago  . OVARIAN CYST REMOVAL     at least 10 years ago  . POLYPECTOMY  08/24/2016   Procedure: POLYPECTOMY;  Surgeon: Rogene Houston, MD;  Location: [facility];  Service: Endoscopy;;  ascending colon; hepatic flexure  . TONSILLECTOMY     childhood     Current Outpatient Medications  Medication Sig Dispense Refill  . ALPRAZolam (XANAX) 0.5 MG tablet TAKE 1/2 TABLET BY MOUTH EVERY 6 HOURS AS NEEDED FOR ANXIETY AND TAKE 1/2 TO 1 TABLET AT BEDTIME AS NEEDED FOR SLEEP 45 tablet 5  . carboxymethylcellulose (REFRESH PLUS) 0.5 % SOLN Place 1 drop into both eyes daily as needed (dry eyes).    . Cholecalciferol 2000 UNITS CAPS Take 2,000 Units by mouth every evening.     . clobetasol ointment (TEMOVATE) 3.23 % Apply 1 application topically 2 (two) times daily as needed.    Marland Kitchen estradiol (ESTRACE) 0.1 MG/GM vaginal cream Place 1 Applicatorful vaginally once a week.    . fish oil-omega-3 fatty acids 1000 MG capsule Take 2 g by mouth every morning.     . hydrochlorothiazide (HYDRODIURIL) 25 MG tablet TAKE ONE TABLET BY MOUTH DAILY 30 tablet 12  .  HYDROcodone-acetaminophen (NORCO) 10-325 MG tablet Take 1 tablet by mouth every 4 (four) hours as needed. 30 tablet 0  . Iron-FA-B Cmp-C-Biot-Probiotic (FUSION PLUS) CAPS Take 1 capsule by mouth every morning.   3  . loratadine (CLARITIN) 10 MG tablet Take 1 tablet (10 mg total) by mouth daily. (Patient taking differently: Take 10 mg by mouth at bedtime. ) 30 tablet 5  . OVER THE COUNTER MEDICATION Take 1 capsule by mouth every morning. [place]    . pantoprazole (PROTONIX) 40 MG tablet TAKE ONE TABLET BY MOUTH DAILY. 30 tablet 5  . potassium chloride (K-DUR,KLOR-CON) 10 MEQ tablet TAKE ONE TABLET BY MOUTH TWICE DAILY 60 tablet 5  . Red Yeast Rice 600 MG CAPS Take 1,200 mg by mouth every morning.     . Rivaroxaban (XARELTO) 15 MG TABS tablet Take 1 tablet (15 mg total) by mouth daily with supper. 21 tablet 0  . sodium  chloride (OCEAN) 0.65 % nasal spray Place 1 spray into the nose as needed for congestion.     No current facility-administered medications for this visit.     Allergies:   Amlodipine and Niaspan [niacin er]     ROS:  Please see the history of present illness.   Otherwise, review of systems are positive for none.   All other systems are reviewed and negative.    PHYSICAL EXAM: VS:  BP (!) 126/54 (BP Location: Left Arm, Patient Position: Sitting, Cuff Size: Normal)   Pulse (!) 54   Ht 5' 7.5" (1.715 m)   Wt 135 lb (61.2 kg)   BMI 20.83 kg/m  , BMI Body mass index is 20.83 kg/m.  GENERAL:  Well appearing NECK:  No jugular venous distention, waveform within normal limits, carotid upstroke brisk and symmetric, no bruits, no thyromegaly LUNGS:  Clear to auscultation bilaterally CHEST:  Unremarkable HEART:  PMI not displaced or sustained,S1 and S2 within normal limits, no S3, no S4, no clicks, no rubs, 3 out of 6 apical systolic murmur radiating slightly at the aortic outflow tract, no diastolic murmurs ABD:  Flat, positive bowel sounds normal in frequency in pitch, no bruits, no rebound, no guarding, no midline pulsatile mass, no hepatomegaly, no splenomegaly EXT:  2 plus pulses throughout, no edema, no cyanosis no clubbing   EKG:  EKG is  ordered today. The ekg ordered today demonstrates sinus bradycardia, rate 54, axis within normal limits, intervals within normal limits, no acute ST-T wave changes.   Recent Labs: 11/28/2017: ALT 16; B Natriuretic Peptide 143.0; BUN 21; Creatinine, Ser 1.34; Hemoglobin 12.3; Platelets 278; Potassium 3.4; Sodium 135    Lipid Panel    Component Value Date/Time   CHOL 160 11/23/2017 0930   TRIG 66 11/23/2017 0930   HDL 63 11/23/2017 0930   CHOLHDL 2.5 11/23/2017 0930   CHOLHDL 3.7 05/13/2016 0547   VLDL 21 05/13/2016 0547   LDLCALC 84 11/23/2017 0930      Wt Readings from Last 3 Encounters:  12/07/17 135 lb (61.2 kg)  11/27/17 144 lb (65.3  kg)  11/24/17 144 lb 0.4 oz (65.3 kg)      Other studies Reviewed: Additional studies/ records that were reviewed today include: Hospital records. Review of the above records demonstrates:     ASSESSMENT AND PLAN:   PAF:    Ms. OFELIA PODOLSKI has a CHA2DS2 - VASc score of 6.    She has not had any symptomatic tachyarrhythmias.  She is in sinus now.  She can  hold the Xarelto for 3 days prior to the spinal injection.   HTN:   The blood pressure is at target. No change in medications is indicated. We will continue with therapeutic lifestyle changes (TLC).  AORTIC STENOSIS:     I will repeat this but she wants to wait to schedule this.    BRADYCARDIA:  She has fatigue.  When she is able I have talked to her daughter about checking her heart rate when she is ambulatory to make sure that it goes up but I think the fatigue is unrelated.  I asked him to check with her primary doctor to see if there is been a recent TSH as of the most recent one that I can find is 2017  PREOP:  As above.  She can hold the Xarelto for three days prior to the spinal injection.  She is at acceptable risk to procede.    Current medicines are reviewed at length with the patient today.  The patient does not have concerns regarding medicines.  The following changes have been made:  As above  Labs/ tests ordered today include: None  Orders Placed This Encounter  Procedures  . EKG 12-Lead     Disposition:   FU with me in in one year.    Signed, Minus Breeding, MD  12/07/2017 5:44 PM    [facility]

## 2017-12-07 NOTE — Telephone Encounter (Signed)
Last seen 12/01/17. Hydrocodone 10/325 is helping with pain. Would like to pick this up today asap

## 2017-12-07 NOTE — Telephone Encounter (Signed)
Discussed with Becky Baird to give a 24 - 48 hour notice next time and that script is ready for pickup. She verbalized understanding and states she will give notice next time she did not know. And wanted to thank you for getting it ready for her.

## 2017-12-08 ENCOUNTER — Ambulatory Visit: Payer: Medicare Other | Admitting: Cardiology

## 2017-12-08 ENCOUNTER — Other Ambulatory Visit: Payer: Self-pay | Admitting: Family Medicine

## 2017-12-08 DIAGNOSIS — M19031 Primary osteoarthritis, right wrist: Secondary | ICD-10-CM | POA: Diagnosis not present

## 2017-12-08 DIAGNOSIS — M545 Low back pain: Principal | ICD-10-CM

## 2017-12-08 DIAGNOSIS — M5431 Sciatica, right side: Secondary | ICD-10-CM | POA: Diagnosis not present

## 2017-12-08 DIAGNOSIS — G8929 Other chronic pain: Secondary | ICD-10-CM

## 2017-12-11 ENCOUNTER — Telehealth: Payer: Self-pay | Admitting: *Deleted

## 2017-12-11 ENCOUNTER — Ambulatory Visit
Admission: RE | Admit: 2017-12-11 | Discharge: 2017-12-11 | Disposition: A | Discharge status: Home / Self Care | Payer: Medicare Other | Source: Ambulatory Visit | Attending: Family Medicine | Admitting: Family Medicine

## 2017-12-11 DIAGNOSIS — M5431 Sciatica, right side: Secondary | ICD-10-CM | POA: Diagnosis not present

## 2017-12-11 DIAGNOSIS — M545 Low back pain: Principal | ICD-10-CM

## 2017-12-11 DIAGNOSIS — G8929 Other chronic pain: Secondary | ICD-10-CM

## 2017-12-11 DIAGNOSIS — M47817 Spondylosis without myelopathy or radiculopathy, lumbosacral region: Secondary | ICD-10-CM | POA: Diagnosis not present

## 2017-12-11 DIAGNOSIS — M19031 Primary osteoarthritis, right wrist: Secondary | ICD-10-CM | POA: Diagnosis not present

## 2017-12-11 MED ORDER — IOPAMIDOL (ISOVUE-M 200) INJECTION 41%
1.0000 mL | Freq: Once | INTRAMUSCULAR | Status: AC
  Administered 2017-12-11: 1 mL via EPIDURAL

## 2017-12-11 MED ORDER — METHYLPREDNISOLONE ACETATE 40 MG/ML INJ SUSP (RADIOLOG
120.0000 mg | Freq: Once | INTRAMUSCULAR | Status: AC
  Administered 2017-12-11: 120 mg via EPIDURAL

## 2017-12-11 NOTE — Telephone Encounter (Signed)
   Primary Cardiologist: Minus Breeding, MD  Chart reviewed as part of pre-operative protocol coverage. The patient was seen in the office by Dr. Percival Spanish on 12/07/17. Given past medical history, based on ACC/AHA guidelines, and per Dr. Shirlean Mylar Johny Shock would be at acceptable risk for the planned procedure without further cardiovascular testing.   Per Dr. Percival Spanish: [name] has a CHA2DS2 - VASc score of 6.  She has not had any symptomatic tachyarrhythmias.  She is in sinus now.  She can hold the Xarelto for 3 days prior to the spinal injection.   I will route this recommendation to the requesting party via Epic fax function and remove from pre-op pool.  Please call with questions.  Daune Perch, NP 12/11/2017, 4:05 PM

## 2017-12-11 NOTE — Discharge Instructions (Signed)

## 2017-12-11 NOTE — Telephone Encounter (Signed)
Script written on 5/16 for #30 one every 6 hours.

## 2017-12-11 NOTE — Telephone Encounter (Signed)
We can write an additional prescription please find out what is the frequency currently that the patient is taking her medication?  I will sign the prescription on Tuesday morning

## 2017-12-11 NOTE — Telephone Encounter (Signed)
I called and left a message to r/c. 

## 2017-12-11 NOTE — Telephone Encounter (Signed)
Patient's daughter called requesting patient's hydrocodone to be refilled .Please advise

## 2017-12-12 DIAGNOSIS — M5431 Sciatica, right side: Secondary | ICD-10-CM | POA: Diagnosis not present

## 2017-12-12 DIAGNOSIS — M19031 Primary osteoarthritis, right wrist: Secondary | ICD-10-CM | POA: Diagnosis not present

## 2017-12-12 NOTE — Telephone Encounter (Signed)
Patient daughter called and states her mother does not need any medication at this time as she had a steroid shot yesterday and is feeling much better. She is seeing occupation therapy and is doing much better.They will call back if she needs any thing else.

## 2017-12-13 ENCOUNTER — Telehealth: Payer: Self-pay | Admitting: Family Medicine

## 2017-12-13 DIAGNOSIS — M5431 Sciatica, right side: Secondary | ICD-10-CM | POA: Diagnosis not present

## 2017-12-13 DIAGNOSIS — M19031 Primary osteoarthritis, right wrist: Secondary | ICD-10-CM | POA: Diagnosis not present

## 2017-12-13 NOTE — Telephone Encounter (Signed)
Please advise 

## 2017-12-13 NOTE — Telephone Encounter (Signed)
Requesting verbal order for OT: 1 time a week for 3 weeks for ADL, transfers, IADL, exercise, and D/C when goals met or at max potential.

## 2017-12-13 NOTE — Telephone Encounter (Signed)
I agree with this- it is fine to give verbal order

## 2017-12-13 NOTE — Telephone Encounter (Signed)
I called and left(Don) a message asked that he R/c.

## 2017-12-14 ENCOUNTER — Other Ambulatory Visit: Payer: Self-pay

## 2017-12-14 ENCOUNTER — Telehealth: Payer: Self-pay | Admitting: Family Medicine

## 2017-12-14 DIAGNOSIS — M5431 Sciatica, right side: Secondary | ICD-10-CM | POA: Diagnosis not present

## 2017-12-14 DIAGNOSIS — M19031 Primary osteoarthritis, right wrist: Secondary | ICD-10-CM | POA: Diagnosis not present

## 2017-12-14 MED ORDER — HYDROCODONE-ACETAMINOPHEN 10-325 MG PO TABS: 1.0000 | ORAL_TABLET | ORAL | 0 refills | Status: DC | PRN

## 2017-12-14 NOTE — Telephone Encounter (Signed)
Script printed and signed; waiting up front for pickup. Pt notified.

## 2017-12-14 NOTE — Telephone Encounter (Signed)
May have 1 additional refill of 30 tablets keep orders the same

## 2017-12-14 NOTE — Telephone Encounter (Signed)
Patient is requesting refill on hydrocodone 10/325 stating she only has four pills left last filled 12/07/17.

## 2017-12-15 NOTE — Telephone Encounter (Signed)
Becky Baird is aware of all.

## 2017-12-19 DIAGNOSIS — M19031 Primary osteoarthritis, right wrist: Secondary | ICD-10-CM | POA: Diagnosis not present

## 2017-12-19 DIAGNOSIS — M5431 Sciatica, right side: Secondary | ICD-10-CM | POA: Diagnosis not present

## 2017-12-20 ENCOUNTER — Ambulatory Visit: Payer: Medicare Other | Admitting: Cardiology

## 2017-12-20 DIAGNOSIS — M19031 Primary osteoarthritis, right wrist: Secondary | ICD-10-CM | POA: Diagnosis not present

## 2017-12-20 DIAGNOSIS — M5431 Sciatica, right side: Secondary | ICD-10-CM | POA: Diagnosis not present

## 2017-12-21 DIAGNOSIS — M5431 Sciatica, right side: Secondary | ICD-10-CM | POA: Diagnosis not present

## 2017-12-21 DIAGNOSIS — M19031 Primary osteoarthritis, right wrist: Secondary | ICD-10-CM | POA: Diagnosis not present

## 2017-12-25 ENCOUNTER — Telehealth: Payer: Self-pay | Admitting: Cardiology

## 2017-12-25 ENCOUNTER — Telehealth: Payer: Self-pay | Admitting: *Deleted

## 2017-12-25 ENCOUNTER — Telehealth: Payer: Self-pay | Admitting: Family Medicine

## 2017-12-25 MED ORDER — RIVAROXABAN 15 MG PO TABS: 15.0000 mg | ORAL_TABLET | Freq: Every day | ORAL | 0 refills | Status: DC

## 2017-12-25 MED ORDER — GABAPENTIN 300 MG PO CAPS: 300.0000 mg | ORAL_CAPSULE | Freq: Every day | ORAL | 2 refills | Status: DC

## 2017-12-25 NOTE — Telephone Encounter (Signed)
Spoke with pt who states she was given samples of Xarelto 20 mg. Pt informed that she could bring them back into the office and we would exchange for the correct dose.   Xarelto 15 mg Qty: 3 bottles Lot # [id number] exp: 2/21

## 2017-12-25 NOTE — Telephone Encounter (Signed)
1.-Please verify dosing.  May give 30-day refill.  2.  Would be okay to give a 30-day refill and follow-up next week

## 2017-12-25 NOTE — Telephone Encounter (Signed)
Daughter calling office - requesting samples of Xarelto 15mg .  Stated they were given 20mg  tablets at last OV with Dr. Percival Spanish in the [facility] office.  Samples available & will be put up front for pick up.

## 2017-12-25 NOTE — Telephone Encounter (Signed)
In the history was gabapentin 300mg  one QHS, per Dtr this is correct. She would like to have this sent in to [facility]. Please advise if this is the dose and frequency you want her on.Thanks,

## 2017-12-25 NOTE — Telephone Encounter (Signed)
New message   Patient calling the office for samples of medication:   1.  What medication and dosage are you requesting samples for? xarelto 15mg   2.  Are you currently out of this medication?  They were given the wrong dosage, they were given 20 mg , they want to bring the 20mg  back and get 15mg  , can they pick them up at the [facility] or [place] office

## 2017-12-25 NOTE — Telephone Encounter (Signed)
Pt daughter contacted office to get refill on Gabapentin. Looking back in chart, the provider wanted to stop Gabapentin due to drowsiness. Pt daughter stated that she has taken this med before and does OK with it as long as she eats before hand. Pt does have an appt next Friday. Daughter wanted to know if they needed to change appt to earlier time to discuss this. Pt daughter stated that pt is down to virtually no pain meds and the Gabapentin seemed to really help. Please advise. Thanks

## 2017-12-25 NOTE — Telephone Encounter (Signed)
Sent in the Rx to [facility] dtr aware.

## 2017-12-25 NOTE — Telephone Encounter (Signed)
This prescription would be fine.  Gabapentin 300 mg 1 nightly.  #30, 2 refills

## 2017-12-26 DIAGNOSIS — M5431 Sciatica, right side: Secondary | ICD-10-CM | POA: Diagnosis not present

## 2017-12-26 DIAGNOSIS — M19031 Primary osteoarthritis, right wrist: Secondary | ICD-10-CM | POA: Diagnosis not present

## 2017-12-28 ENCOUNTER — Telehealth: Payer: Self-pay | Admitting: *Deleted

## 2017-12-28 NOTE — Telephone Encounter (Signed)
Pt daughter call stating her mom  was given Xarelto 20 mg samples and she is taking 15 mg, pt want to know if she can return the xarelto samples for the right dose

## 2017-12-29 ENCOUNTER — Telehealth: Payer: Self-pay | Admitting: *Deleted

## 2017-12-29 NOTE — Telephone Encounter (Signed)
Tommi Emery with [facility] called for patient stating patient declined need for last occupational therapy, Timmothy Sours will send over discharge orders.

## 2017-12-29 NOTE — Telephone Encounter (Signed)
So noted 

## 2018-01-01 ENCOUNTER — Other Ambulatory Visit: Payer: Self-pay | Admitting: Family Medicine

## 2018-01-01 DIAGNOSIS — M5431 Sciatica, right side: Secondary | ICD-10-CM | POA: Diagnosis not present

## 2018-01-01 DIAGNOSIS — M19031 Primary osteoarthritis, right wrist: Secondary | ICD-10-CM | POA: Diagnosis not present

## 2018-01-01 NOTE — Telephone Encounter (Signed)
Spoke with pt daughter she picked-up Xarelto samples in [place] office. Mom is in [place] for the rest of the year and Rx cost $237/month for this medication, she cannot afford.   Pt completed paperwork for assistance program in 04/2017, would like to know if she can apply again for 2019. Told pt daughter, [name], that Dr. Rosezella [place] primary will be in contact with her by the end of the week with an status update, she is very Patent attorney.   Daughter to complete DPR next time in office but best if we call her with all pt appts, questions, and results.

## 2018-01-04 NOTE — Telephone Encounter (Signed)
Leave message for pt to call back 

## 2018-01-04 NOTE — Telephone Encounter (Signed)
Follow up ° ° ° ° °Please return call to daughter °

## 2018-01-05 ENCOUNTER — Encounter: Payer: Self-pay | Admitting: Family Medicine

## 2018-01-05 ENCOUNTER — Ambulatory Visit (INDEPENDENT_AMBULATORY_CARE_PROVIDER_SITE_OTHER): Payer: Medicare Other | Admitting: Family Medicine

## 2018-01-05 VITALS — BP 158/62 | Temp 98.1°F | Ht 67.5 in | Wt 147.0 lb

## 2018-01-05 DIAGNOSIS — R300 Dysuria: Secondary | ICD-10-CM | POA: Diagnosis not present

## 2018-01-05 DIAGNOSIS — M5432 Sciatica, left side: Secondary | ICD-10-CM | POA: Diagnosis not present

## 2018-01-05 DIAGNOSIS — S3210XD Unspecified fracture of sacrum, subsequent encounter for fracture with routine healing: Secondary | ICD-10-CM

## 2018-01-05 LAB — POCT URINALYSIS DIPSTICK
Nitrite, UA: POSITIVE
RBC UA: NEGATIVE
Spec Grav, UA: 1.015 (ref 1.010–1.025)
pH, UA: 6 (ref 5.0–8.0)

## 2018-01-05 MED ORDER — HYDROCODONE-ACETAMINOPHEN 10-325 MG PO TABS: 1.0000 | ORAL_TABLET | ORAL | 0 refills | Status: DC | PRN

## 2018-01-05 MED ORDER — CEFPROZIL 250 MG PO TABS: 250.0000 mg | ORAL_TABLET | Freq: Two times a day (BID) | ORAL | 0 refills | Status: DC

## 2018-01-05 NOTE — Progress Notes (Signed)
   Subjective:    Patient ID: Becky Baird, female    DOB: 1933/11/06, 84 y.o.   MRN: [id number]  HPI Very nice patient here with her daughter   patient is here today to follow up on back pain,also having some left leg pain,left foot edema, some times goes down the leg. On right hip and left hip has a sensation of a match burning,in right elbow also. She states the left arm is sore.Needs refill on pain med, has a question about Gabapentin.the patient uses her right hand to maneuver at the pain on a regular basis has had some pain in the elbows wondered what that was related to her back pain  She has had chronic inflamed low back discomfort along with  Radiation into the left lower leg she denies weakness but she has difficult time getting around  She only uses her pain medicine at nighttime but unfortunately she also takes a half of Xanax at nighttime understands that this could be a dangerous combination she was not aware that toenail she states from now on she will avoid using Xanax around the same time as her pain medicine  We did discuss that responsible use of pain medication can help bring the level of her pain down to a more tolerable range but will not really remove it   Also having some burning while urinating. Would like to know if she is needing another back injection.  She denies high fever chills sweats wheezing difficulty breathing   Review of Systems    Please see above Objective:   Physical Exam Lungs are clear no crackles heart regular no murmurs extremities trace edema skin warm dry neurologic grossly normal subjective discomfort sciatica down the left leg subjective discomfort in the lower back significant tenderness in the right elbow region      Assessment & Plan:  Sacral fracture-gradually getting better not having to use as much pain medicine but the patient at times let her pain get too far in advance before taking anything I have assured her that responsible  usage of medicine is not considered bad she may use of pain medicine a little more often she was given 40 tablets in the follow-up in 6 weeks  We will connect with interventional radiology about doing the injection to help her with her sciatica down the left leg she is to continue her gabapentin  She is to do her exercises shown by physical therapy  Mild dysuria UTI antibiotics and it  Greater than 25 minutes spent with the patient discussing multiple aspects of her pain in her recovery discussing multiple questions regarding neurologic issues pain management and controlgreater than half the time was spent

## 2018-01-08 ENCOUNTER — Telehealth: Payer: Self-pay

## 2018-01-08 LAB — URINE CULTURE

## 2018-01-08 LAB — SPECIMEN STATUS REPORT

## 2018-01-08 NOTE — Telephone Encounter (Signed)
Patient is aware 

## 2018-01-08 NOTE — Progress Notes (Signed)
Referral placed.

## 2018-01-08 NOTE — Progress Notes (Signed)
I placed the referral and then forwarded this message to [place].

## 2018-01-08 NOTE — Telephone Encounter (Signed)
Patient called and states that while here the other day for her appointment you had asked if she had had any tingling or burning in her limbs. She states she woke this am around 4 am  with a burning sensation in her right hand ,elbow,and right hip.She says she is no longer experiencing this, but wanted to let you know. The referral to Dr.Curnes was placed this am.

## 2018-01-08 NOTE — Addendum Note (Signed)
Addended by: Karle Barr on: 01/08/2018 08:35 AM   Modules accepted: Orders

## 2018-01-08 NOTE — Telephone Encounter (Signed)
Telephone call no answer 

## 2018-01-08 NOTE — Telephone Encounter (Signed)
For now I am not worried about this but if it becomes a pervasive symptom we will need to get specialist opinion-this is not necessary currently

## 2018-01-09 ENCOUNTER — Other Ambulatory Visit: Payer: Self-pay | Admitting: Family Medicine

## 2018-01-09 DIAGNOSIS — L900 Lichen sclerosus et atrophicus: Secondary | ICD-10-CM | POA: Diagnosis not present

## 2018-01-09 DIAGNOSIS — N952 Postmenopausal atrophic vaginitis: Secondary | ICD-10-CM | POA: Diagnosis not present

## 2018-01-09 MED ORDER — AMOXICILLIN-POT CLAVULANATE 875-125 MG PO TABS: ORAL_TABLET | ORAL | 0 refills | Status: DC

## 2018-01-09 NOTE — Telephone Encounter (Signed)
Spoke with pt daughter she stated she will come in next Thursday to pick up pt assistance form.

## 2018-01-12 ENCOUNTER — Other Ambulatory Visit: Payer: Self-pay | Admitting: Family Medicine

## 2018-01-12 DIAGNOSIS — S3210XD Unspecified fracture of sacrum, subsequent encounter for fracture with routine healing: Secondary | ICD-10-CM

## 2018-01-12 DIAGNOSIS — M5432 Sciatica, left side: Secondary | ICD-10-CM

## 2018-01-16 ENCOUNTER — Telehealth: Payer: Self-pay | Admitting: Family Medicine

## 2018-01-16 NOTE — Telephone Encounter (Signed)
Patient stating she is scheduled for her second epidural injection on Friday. Patient stated her pain is now some better-still having sciatica but it is not excruciating like before and wondered if she she should go get another shot because she was not sure what the shot was for. Advised patient that th epidural injections help with the pain she was having and she should go for the second injection as advised to help her maintain th level of pain control she now has. patient verbalized understanding and stated she would go for the second injection as advised by specialist.

## 2018-01-16 NOTE — Telephone Encounter (Signed)
Patient is supposed to have another epidural this Friday.  She would like a nurse to give her a call back and discuss this.

## 2018-01-17 ENCOUNTER — Telehealth: Payer: Self-pay | Admitting: Family Medicine

## 2018-01-17 DIAGNOSIS — M5432 Sciatica, left side: Secondary | ICD-10-CM

## 2018-01-17 NOTE — Telephone Encounter (Signed)
If the patient wants to stop shots it is her right to do so.  I would imagine we need to do a another physical therapy consultation regarding this-once referral is sent over the family can help set this up

## 2018-01-17 NOTE — Telephone Encounter (Signed)
Patient wants to stop shots and go back to physical therapy but needs written or verbal order, Daughter [name] didn't remember the name but its in [place] . You may have to contact patient to find out.And daughter wanted to say thank you to the staff for all the help we have giving them with the care of her mother. [name]-[phone]

## 2018-01-17 NOTE — Telephone Encounter (Signed)
See yesterdays message from patient

## 2018-01-17 NOTE — Telephone Encounter (Signed)
Referral ordered in Epic. Daughter(DPR) notified. 

## 2018-01-18 ENCOUNTER — Telehealth: Payer: Self-pay | Admitting: Cardiology

## 2018-01-18 NOTE — Telephone Encounter (Signed)
New Message     Patient daughter is calling because some Xarelto was to be left at the front desk. She would like to know when she can come pick it up. I spoke with the front desk staff and was advised that its no longer there. Please call.

## 2018-01-18 NOTE — Telephone Encounter (Signed)
Lm to call back ./cy 

## 2018-01-19 ENCOUNTER — Other Ambulatory Visit: Payer: Medicare Other

## 2018-01-19 NOTE — Telephone Encounter (Signed)
Medication samples have been provided to the patient.  Drug name: xarelto 15mg   Qty: 4 bottles  LOT: [id number]  Exp.Date: 10/2019  Samples left at front desk for patient pick-up. Patient notified.  Sheral Apley M 1:44 PM 01/19/2018   ____________ Damaris Schooner with daughter [name] who actually would like samples from [place] or [place]. [place] office has left 2 bottles of samples for patient - LM for daughter with this info.

## 2018-01-19 NOTE — Telephone Encounter (Signed)
Follow up    Patients daughter is calling in reference to the Xarelto samples. She is now in [place] and is wondering can the Xarelto be available for pick up there. Please call to discuss.

## 2018-01-24 ENCOUNTER — Encounter (INDEPENDENT_AMBULATORY_CARE_PROVIDER_SITE_OTHER): Payer: Self-pay

## 2018-01-24 ENCOUNTER — Encounter: Payer: Self-pay | Admitting: Family Medicine

## 2018-01-24 DIAGNOSIS — N361 Urethral diverticulum: Secondary | ICD-10-CM | POA: Diagnosis not present

## 2018-01-24 DIAGNOSIS — N814 Uterovaginal prolapse, unspecified: Secondary | ICD-10-CM | POA: Diagnosis not present

## 2018-01-29 ENCOUNTER — Other Ambulatory Visit: Payer: Self-pay | Admitting: *Deleted

## 2018-01-29 ENCOUNTER — Telehealth: Payer: Self-pay | Admitting: Family Medicine

## 2018-01-29 ENCOUNTER — Telehealth: Payer: Self-pay | Admitting: Cardiology

## 2018-01-29 MED ORDER — GABAPENTIN 300 MG PO CAPS: 300.0000 mg | ORAL_CAPSULE | Freq: Every day | ORAL | 0 refills | Status: DC

## 2018-01-29 NOTE — Telephone Encounter (Signed)
Spoke with daughter and verified [facility] office for pick up. Xarelto 15 mg #3 lot # [id number] exp 4/21 at front for pick, daughter aware

## 2018-01-29 NOTE — Telephone Encounter (Signed)
New Message     Patient calling the office for samples of medication:  1.  What medication and dosage are you requesting samples for? Xarelto/15 mg   2.  Are you currently out of this medication? Almost out, patient daughter would like to pick up tomorrow.

## 2018-01-29 NOTE — Telephone Encounter (Signed)
Last seen 01/05/18 

## 2018-01-29 NOTE — Telephone Encounter (Signed)
One mo ref f u with dr Nicki Reaper as sched

## 2018-01-29 NOTE — Telephone Encounter (Signed)
Discussed with pt's daughter. Refill sent to pharm

## 2018-01-29 NOTE — Telephone Encounter (Signed)
Pt ran out of her gabapentin (NEURONTIN) 300 MG capsule a few days ago.  Pt felt that it really helped and would like a refill if possible.  She has an appointment with Dr. Nicki Reaper on 02/16/18  Daughter wonders if she needs to be seen or can we refill meds  Pt also pretty tired this morning but does not seem to be related to being out of this medicine for a few days   Please advise & call daughter [name] [phone]     [facility]- [facility]

## 2018-02-01 DIAGNOSIS — L03031 Cellulitis of right toe: Secondary | ICD-10-CM | POA: Diagnosis not present

## 2018-02-01 DIAGNOSIS — L600 Ingrowing nail: Secondary | ICD-10-CM | POA: Diagnosis not present

## 2018-02-01 DIAGNOSIS — L03032 Cellulitis of left toe: Secondary | ICD-10-CM | POA: Diagnosis not present

## 2018-02-01 DIAGNOSIS — M79671 Pain in right foot: Secondary | ICD-10-CM | POA: Diagnosis not present

## 2018-02-06 ENCOUNTER — Telehealth: Payer: Self-pay | Admitting: Family Medicine

## 2018-02-06 DIAGNOSIS — M545 Low back pain: Secondary | ICD-10-CM | POA: Diagnosis not present

## 2018-02-06 DIAGNOSIS — R2689 Other abnormalities of gait and mobility: Secondary | ICD-10-CM | POA: Diagnosis not present

## 2018-02-06 DIAGNOSIS — M5432 Sciatica, left side: Secondary | ICD-10-CM | POA: Diagnosis not present

## 2018-02-06 NOTE — Telephone Encounter (Signed)
Becky Baird called because Mom is having some labs done for Dr. Posey Pronto this week (bun and creatinine, she thinks) and she wants to know if Dr. Nicki Reaper needs any labs done prior to her f/u appt here on the 26th so she can have done all at one time.  If so they will be going to [facility] later this week so let her know.  [phone], Becky Baird said just leave her a message if order put in.

## 2018-02-06 NOTE — Telephone Encounter (Signed)
Last labs ordered by dr Nicki Reaper. 11/23/17 lipid, liver, a1c, cbc, bmp, cbc

## 2018-02-07 ENCOUNTER — Other Ambulatory Visit: Payer: Self-pay | Admitting: Family Medicine

## 2018-02-07 NOTE — Telephone Encounter (Signed)
Test that Dr. Posey Pronto was doing is adequate-please explained to the daughter that you spoke with Dr. Serita Grit office he reviewed the labs with me.  Please have family try to get a copy of the labs to be once they know the results thank you

## 2018-02-07 NOTE — Telephone Encounter (Signed)
I called Dr.Patels office and Erline Levine there in the lab states the pt has Renal,Mag,Vit D,CBC ordered in preparation of the appointment on 02/13/2018.

## 2018-02-07 NOTE — Telephone Encounter (Signed)
I called and left a message asking pt dtr to r/c.

## 2018-02-07 NOTE — Telephone Encounter (Signed)
I would recommend doing a metabolic 7.-Please inform the daughter that this would help look at the patient's sodium potassium and would also include BUN and creatinine no other tests are indicated currently.  If she does attest this week we would be able to get results and we can forward the results to them and the results would be available in the system-more than likely would not have to get BUN and creatinine done since this is included in the metabolic 7 but I will leave that to the daughter to discuss with Dr. Serita Grit office what ever works best for them is fine

## 2018-02-08 DIAGNOSIS — M545 Low back pain: Secondary | ICD-10-CM | POA: Diagnosis not present

## 2018-02-08 DIAGNOSIS — M5432 Sciatica, left side: Secondary | ICD-10-CM | POA: Diagnosis not present

## 2018-02-08 DIAGNOSIS — R2689 Other abnormalities of gait and mobility: Secondary | ICD-10-CM | POA: Diagnosis not present

## 2018-02-09 DIAGNOSIS — N183 Chronic kidney disease, stage 3 (moderate): Secondary | ICD-10-CM | POA: Diagnosis not present

## 2018-02-09 DIAGNOSIS — E889 Metabolic disorder, unspecified: Secondary | ICD-10-CM | POA: Diagnosis not present

## 2018-02-09 DIAGNOSIS — N189 Chronic kidney disease, unspecified: Secondary | ICD-10-CM | POA: Diagnosis not present

## 2018-02-09 NOTE — Telephone Encounter (Signed)
Pt daughter is aware.

## 2018-02-12 DIAGNOSIS — M545 Low back pain: Secondary | ICD-10-CM | POA: Diagnosis not present

## 2018-02-12 DIAGNOSIS — R2689 Other abnormalities of gait and mobility: Secondary | ICD-10-CM | POA: Diagnosis not present

## 2018-02-12 DIAGNOSIS — M5432 Sciatica, left side: Secondary | ICD-10-CM | POA: Diagnosis not present

## 2018-02-13 DIAGNOSIS — I129 Hypertensive chronic kidney disease with stage 1 through stage 4 chronic kidney disease, or unspecified chronic kidney disease: Secondary | ICD-10-CM | POA: Diagnosis not present

## 2018-02-13 DIAGNOSIS — N183 Chronic kidney disease, stage 3 (moderate): Secondary | ICD-10-CM | POA: Diagnosis not present

## 2018-02-13 DIAGNOSIS — E889 Metabolic disorder, unspecified: Secondary | ICD-10-CM | POA: Diagnosis not present

## 2018-02-13 DIAGNOSIS — M9080 Osteopathy in diseases classified elsewhere, unspecified site: Secondary | ICD-10-CM | POA: Diagnosis not present

## 2018-02-13 DIAGNOSIS — D631 Anemia in chronic kidney disease: Secondary | ICD-10-CM | POA: Diagnosis not present

## 2018-02-14 DIAGNOSIS — L600 Ingrowing nail: Secondary | ICD-10-CM | POA: Diagnosis not present

## 2018-02-14 DIAGNOSIS — I739 Peripheral vascular disease, unspecified: Secondary | ICD-10-CM | POA: Diagnosis not present

## 2018-02-16 ENCOUNTER — Telehealth: Payer: Self-pay

## 2018-02-16 ENCOUNTER — Encounter: Payer: Self-pay | Admitting: Family Medicine

## 2018-02-16 ENCOUNTER — Ambulatory Visit (INDEPENDENT_AMBULATORY_CARE_PROVIDER_SITE_OTHER): Payer: Medicare Other | Admitting: Family Medicine

## 2018-02-16 VITALS — BP 124/62 | Ht 67.5 in | Wt 144.0 lb

## 2018-02-16 DIAGNOSIS — R600 Localized edema: Secondary | ICD-10-CM

## 2018-02-16 DIAGNOSIS — I480 Paroxysmal atrial fibrillation: Secondary | ICD-10-CM | POA: Diagnosis not present

## 2018-02-16 DIAGNOSIS — I350 Nonrheumatic aortic (valve) stenosis: Secondary | ICD-10-CM | POA: Diagnosis not present

## 2018-02-16 DIAGNOSIS — I878 Other specified disorders of veins: Secondary | ICD-10-CM | POA: Diagnosis not present

## 2018-02-16 MED ORDER — GABAPENTIN 300 MG PO CAPS: 300.0000 mg | ORAL_CAPSULE | Freq: Every day | ORAL | 5 refills | Status: DC

## 2018-02-16 NOTE — Telephone Encounter (Signed)
Patient is scheduled for echo on July 31,2019 at 1 pm will need register at 12:45 pm. I called and left detailed message for Daughter Noe Gens on both #'s listed asked that she r/c to confirm receipt of message.

## 2018-02-16 NOTE — Progress Notes (Signed)
   Subjective:    Patient ID: Becky Baird, female    DOB: 02-05-1934, 84 y.o.   MRN: [id number]  HPI Patient is here today to follow up on, back, leg pain ,swollen foot. Whether or not she still needs to stay on Gabapentin.Left foot swelling. Had a arterial study Wednesday [facility]. Long discussion held regarding her sciatica and her gabapentin and seems to be helping her.  She states her energy level overall seems to be doing fair She does get short of breath with activity denies chest tightness pressure pain denies nausea vomiting diarrhea  Patient does have history of hyperlipidemia does not tolerate statins currently taking red rice yeast extract  Patient does get short of breath with activity could be related to deconditioning but she uses it walking cane to get around she also states her overall energy level fair  Review of Systems  Constitutional: Negative for activity change, appetite change and fatigue.  HENT: Negative for congestion and rhinorrhea.   Respiratory: Positive for shortness of breath. Negative for cough.   Cardiovascular: Negative for chest pain and leg swelling.  Gastrointestinal: Negative for abdominal pain and diarrhea.  Endocrine: Negative for polydipsia and polyphagia.  Skin: Negative for color change.  Neurological: Negative for dizziness and weakness.  Psychiatric/Behavioral: Negative for behavioral problems and confusion.       Objective:   Physical Exam  Constitutional: She appears well-nourished. No distress.  HENT:  Head: Normocephalic and atraumatic.  Eyes: Right eye exhibits no discharge. Left eye exhibits no discharge.  Neck: No tracheal deviation present.  Cardiovascular: Normal rate and regular rhythm.  Murmur heard. Pulmonary/Chest: Effort normal and breath sounds normal. No respiratory distress.  Musculoskeletal: She exhibits no edema.  Lymphadenopathy:    She has no cervical adenopathy.  Neurological: She is alert.  Coordination normal.  Skin: Skin is warm and dry.  Psychiatric: She has a normal mood and affect. Her behavior is normal.  Vitals reviewed.         Assessment & Plan:  Valvular stenosis-recommend another echo She does get short of breath with activity but this could be deconditioning could be related to the stenosis  With venous stasis I recommend support hose she will think about this she had Doppler studies of arterial of her legs she will get the results of this forwarded to [place]  Sciatica stable doing well on gabapentin   atrial fib on her medications follows with cardiology on a regular basis  Not having any bleeding issues  25 minutes was spent with the patient.  This statement verifies that 25 minutes was indeed spent with the patient.  More than 50% of this visit-total duration of the visit-was spent in counseling and coordination of care. The issues that the patient came in for today as reflected in the diagnosis (s) please refer to documentation for further details. Between discussing the murmur and the stenosis the gabapentin and the venous stasis and seeing how the patient is doing currently

## 2018-02-19 DIAGNOSIS — M5432 Sciatica, left side: Secondary | ICD-10-CM | POA: Diagnosis not present

## 2018-02-19 DIAGNOSIS — R2689 Other abnormalities of gait and mobility: Secondary | ICD-10-CM | POA: Diagnosis not present

## 2018-02-19 DIAGNOSIS — M545 Low back pain: Secondary | ICD-10-CM | POA: Diagnosis not present

## 2018-02-19 NOTE — Telephone Encounter (Signed)
Daughter did receive message because she called back to get number to reschedule appt.

## 2018-02-20 ENCOUNTER — Other Ambulatory Visit (HOSPITAL_COMMUNITY): Payer: Medicare Other

## 2018-02-21 ENCOUNTER — Telehealth: Payer: Self-pay | Admitting: Family Medicine

## 2018-02-21 ENCOUNTER — Other Ambulatory Visit (HOSPITAL_COMMUNITY): Payer: Medicare Other

## 2018-02-21 DIAGNOSIS — L600 Ingrowing nail: Secondary | ICD-10-CM | POA: Diagnosis not present

## 2018-02-21 DIAGNOSIS — M79672 Pain in left foot: Secondary | ICD-10-CM | POA: Diagnosis not present

## 2018-02-21 DIAGNOSIS — M79675 Pain in left toe(s): Secondary | ICD-10-CM | POA: Diagnosis not present

## 2018-02-21 DIAGNOSIS — L03032 Cellulitis of left toe: Secondary | ICD-10-CM | POA: Diagnosis not present

## 2018-02-21 NOTE — Telephone Encounter (Signed)
Pt's daughter dropped off report on vascular study. Placed in folder by door for review.

## 2018-02-22 ENCOUNTER — Ambulatory Visit (HOSPITAL_COMMUNITY)
Admission: RE | Admit: 2018-02-22 | Discharge: 2018-02-22 | Disposition: A | Discharge status: Home / Self Care | Payer: Medicare Other | Source: Ambulatory Visit | Attending: Family Medicine | Admitting: Family Medicine

## 2018-02-22 DIAGNOSIS — Z8673 Personal history of transient ischemic attack (TIA), and cerebral infarction without residual deficits: Secondary | ICD-10-CM | POA: Insufficient documentation

## 2018-02-22 DIAGNOSIS — I480 Paroxysmal atrial fibrillation: Secondary | ICD-10-CM | POA: Insufficient documentation

## 2018-02-22 DIAGNOSIS — R7303 Prediabetes: Secondary | ICD-10-CM | POA: Insufficient documentation

## 2018-02-22 DIAGNOSIS — I10 Essential (primary) hypertension: Secondary | ICD-10-CM | POA: Insufficient documentation

## 2018-02-22 DIAGNOSIS — I878 Other specified disorders of veins: Secondary | ICD-10-CM | POA: Insufficient documentation

## 2018-02-22 DIAGNOSIS — K219 Gastro-esophageal reflux disease without esophagitis: Secondary | ICD-10-CM | POA: Insufficient documentation

## 2018-02-22 DIAGNOSIS — E785 Hyperlipidemia, unspecified: Secondary | ICD-10-CM | POA: Diagnosis not present

## 2018-02-22 DIAGNOSIS — I700 Atherosclerosis of aorta: Secondary | ICD-10-CM | POA: Diagnosis not present

## 2018-02-22 DIAGNOSIS — I350 Nonrheumatic aortic (valve) stenosis: Secondary | ICD-10-CM | POA: Insufficient documentation

## 2018-02-22 NOTE — Progress Notes (Signed)
*  PRELIMINARY RESULTS* Echocardiogram 2D Echocardiogram has been performed.  Becky Baird 02/22/2018, 3:50 PM

## 2018-02-22 NOTE — Telephone Encounter (Signed)
ABI was normal

## 2018-02-25 ENCOUNTER — Telehealth: Payer: Self-pay | Admitting: Family Medicine

## 2018-02-25 NOTE — Telephone Encounter (Signed)
Nurses-please let the patient know we received her echo.  Please see result notes.  I did communicate with her cardiologist.  They feel that her echo is stable.  More than likely her shortness of breath is related to deconditioning.  Staying physically active within limits is reasonable.  Keep regular follow-up visits every 3 to 4 months-her next one is scheduled in November or sooner if any problems

## 2018-02-26 DIAGNOSIS — R2689 Other abnormalities of gait and mobility: Secondary | ICD-10-CM | POA: Diagnosis not present

## 2018-02-26 DIAGNOSIS — M545 Low back pain: Secondary | ICD-10-CM | POA: Diagnosis not present

## 2018-02-26 DIAGNOSIS — M5432 Sciatica, left side: Secondary | ICD-10-CM | POA: Diagnosis not present

## 2018-02-26 NOTE — Telephone Encounter (Signed)
Advised daughter [name](DPR) per Dr Nicki Reaper: Nurses-Dr Nicki Reaper received her echo. Dr Nicki Reaper did communicate with her cardiologist.  They feel that her echo is stable.  More than likely her shortness of breath is related to deconditioning.  Staying physically active within limits is reasonable.  Keep regular follow-up visits every 3 to 4 months-her next one is scheduled in November or sooner if any problems. Daughter verbalized understanding.

## 2018-03-01 DIAGNOSIS — M5432 Sciatica, left side: Secondary | ICD-10-CM | POA: Diagnosis not present

## 2018-03-01 DIAGNOSIS — M545 Low back pain: Secondary | ICD-10-CM | POA: Diagnosis not present

## 2018-03-01 DIAGNOSIS — R2689 Other abnormalities of gait and mobility: Secondary | ICD-10-CM | POA: Diagnosis not present

## 2018-03-05 DIAGNOSIS — M545 Low back pain: Secondary | ICD-10-CM | POA: Diagnosis not present

## 2018-03-05 DIAGNOSIS — M5432 Sciatica, left side: Secondary | ICD-10-CM | POA: Diagnosis not present

## 2018-03-05 DIAGNOSIS — R2689 Other abnormalities of gait and mobility: Secondary | ICD-10-CM | POA: Diagnosis not present

## 2018-03-07 DIAGNOSIS — M545 Low back pain: Secondary | ICD-10-CM | POA: Diagnosis not present

## 2018-03-07 DIAGNOSIS — M5432 Sciatica, left side: Secondary | ICD-10-CM | POA: Diagnosis not present

## 2018-03-07 DIAGNOSIS — R2689 Other abnormalities of gait and mobility: Secondary | ICD-10-CM | POA: Diagnosis not present

## 2018-03-08 ENCOUNTER — Other Ambulatory Visit: Payer: Self-pay | Admitting: Family Medicine

## 2018-03-08 DIAGNOSIS — L03032 Cellulitis of left toe: Secondary | ICD-10-CM | POA: Diagnosis not present

## 2018-03-08 DIAGNOSIS — L03031 Cellulitis of right toe: Secondary | ICD-10-CM | POA: Diagnosis not present

## 2018-03-08 DIAGNOSIS — L600 Ingrowing nail: Secondary | ICD-10-CM | POA: Diagnosis not present

## 2018-03-08 DIAGNOSIS — M79671 Pain in right foot: Secondary | ICD-10-CM | POA: Diagnosis not present

## 2018-03-12 ENCOUNTER — Telehealth: Payer: Self-pay | Admitting: Family Medicine

## 2018-03-12 MED ORDER — ALPRAZOLAM 0.5 MG PO TABS: ORAL_TABLET | ORAL | 2 refills | Status: DC

## 2018-03-12 NOTE — Telephone Encounter (Signed)
May have this +2 refills 

## 2018-03-12 NOTE — Telephone Encounter (Signed)
Pt calling to check on refill request for her ALPRAZolam Duanne Moron) 0.5 MG tablet  Looks like we received request from pharmacy on 03/08/18, please advise & call pt this afternoon

## 2018-03-12 NOTE — Telephone Encounter (Signed)
Prescription faxed to pharmacy. Patient notified. 

## 2018-03-12 NOTE — Telephone Encounter (Signed)
Refills given via phone

## 2018-03-13 DIAGNOSIS — M5432 Sciatica, left side: Secondary | ICD-10-CM | POA: Diagnosis not present

## 2018-03-13 DIAGNOSIS — M545 Low back pain: Secondary | ICD-10-CM | POA: Diagnosis not present

## 2018-03-13 DIAGNOSIS — R2689 Other abnormalities of gait and mobility: Secondary | ICD-10-CM | POA: Diagnosis not present

## 2018-03-15 DIAGNOSIS — R2689 Other abnormalities of gait and mobility: Secondary | ICD-10-CM | POA: Diagnosis not present

## 2018-03-15 DIAGNOSIS — M5432 Sciatica, left side: Secondary | ICD-10-CM | POA: Diagnosis not present

## 2018-03-15 DIAGNOSIS — M545 Low back pain: Secondary | ICD-10-CM | POA: Diagnosis not present

## 2018-03-20 ENCOUNTER — Telehealth: Payer: Self-pay | Admitting: Cardiology

## 2018-03-20 DIAGNOSIS — M545 Low back pain: Secondary | ICD-10-CM | POA: Diagnosis not present

## 2018-03-20 DIAGNOSIS — R2689 Other abnormalities of gait and mobility: Secondary | ICD-10-CM | POA: Diagnosis not present

## 2018-03-20 DIAGNOSIS — M5432 Sciatica, left side: Secondary | ICD-10-CM | POA: Diagnosis not present

## 2018-03-20 NOTE — Telephone Encounter (Signed)
Samples ready. Patient notified.

## 2018-03-20 NOTE — Telephone Encounter (Signed)
Patient calling the office for samples of medication:   1.  What medication and dosage are you requesting samples for?  Xarelto 15   2.  Are you currently out of this medication? yes

## 2018-03-22 DIAGNOSIS — R2689 Other abnormalities of gait and mobility: Secondary | ICD-10-CM | POA: Diagnosis not present

## 2018-03-22 DIAGNOSIS — M545 Low back pain: Secondary | ICD-10-CM | POA: Diagnosis not present

## 2018-03-22 DIAGNOSIS — M5432 Sciatica, left side: Secondary | ICD-10-CM | POA: Diagnosis not present

## 2018-03-27 DIAGNOSIS — M5432 Sciatica, left side: Secondary | ICD-10-CM | POA: Diagnosis not present

## 2018-03-27 DIAGNOSIS — M545 Low back pain: Secondary | ICD-10-CM | POA: Diagnosis not present

## 2018-03-27 DIAGNOSIS — R2689 Other abnormalities of gait and mobility: Secondary | ICD-10-CM | POA: Diagnosis not present

## 2018-03-28 DIAGNOSIS — R2689 Other abnormalities of gait and mobility: Secondary | ICD-10-CM | POA: Diagnosis not present

## 2018-03-28 DIAGNOSIS — M5432 Sciatica, left side: Secondary | ICD-10-CM | POA: Diagnosis not present

## 2018-03-28 DIAGNOSIS — M545 Low back pain: Secondary | ICD-10-CM | POA: Diagnosis not present

## 2018-04-03 DIAGNOSIS — R2689 Other abnormalities of gait and mobility: Secondary | ICD-10-CM | POA: Diagnosis not present

## 2018-04-03 DIAGNOSIS — M545 Low back pain: Secondary | ICD-10-CM | POA: Diagnosis not present

## 2018-04-03 DIAGNOSIS — M5432 Sciatica, left side: Secondary | ICD-10-CM | POA: Diagnosis not present

## 2018-04-05 DIAGNOSIS — M545 Low back pain: Secondary | ICD-10-CM | POA: Diagnosis not present

## 2018-04-05 DIAGNOSIS — M5432 Sciatica, left side: Secondary | ICD-10-CM | POA: Diagnosis not present

## 2018-04-05 DIAGNOSIS — R2689 Other abnormalities of gait and mobility: Secondary | ICD-10-CM | POA: Diagnosis not present

## 2018-04-07 ENCOUNTER — Emergency Department (HOSPITAL_COMMUNITY): Payer: Medicare Other

## 2018-04-07 ENCOUNTER — Encounter (HOSPITAL_COMMUNITY): Payer: Self-pay | Admitting: *Deleted

## 2018-04-07 ENCOUNTER — Other Ambulatory Visit: Payer: Self-pay

## 2018-04-07 ENCOUNTER — Inpatient Hospital Stay (HOSPITAL_COMMUNITY)
Admission: EM | Admit: 2018-04-07 | Discharge: 2018-04-10 | DRG: 689 | Disposition: A | Discharge status: Home / Self Care | Payer: Medicare Other | Attending: Family Medicine | Admitting: Family Medicine

## 2018-04-07 ENCOUNTER — Inpatient Hospital Stay (HOSPITAL_COMMUNITY): Payer: Medicare Other

## 2018-04-07 DIAGNOSIS — I440 Atrioventricular block, first degree: Secondary | ICD-10-CM | POA: Diagnosis not present

## 2018-04-07 DIAGNOSIS — N390 Urinary tract infection, site not specified: Secondary | ICD-10-CM | POA: Diagnosis not present

## 2018-04-07 DIAGNOSIS — F419 Anxiety disorder, unspecified: Secondary | ICD-10-CM | POA: Diagnosis present

## 2018-04-07 DIAGNOSIS — I129 Hypertensive chronic kidney disease with stage 1 through stage 4 chronic kidney disease, or unspecified chronic kidney disease: Secondary | ICD-10-CM | POA: Diagnosis present

## 2018-04-07 DIAGNOSIS — N281 Cyst of kidney, acquired: Secondary | ICD-10-CM | POA: Diagnosis not present

## 2018-04-07 DIAGNOSIS — G9341 Metabolic encephalopathy: Secondary | ICD-10-CM | POA: Diagnosis present

## 2018-04-07 DIAGNOSIS — Z79899 Other long term (current) drug therapy: Secondary | ICD-10-CM

## 2018-04-07 DIAGNOSIS — Z888 Allergy status to other drugs, medicaments and biological substances status: Secondary | ICD-10-CM

## 2018-04-07 DIAGNOSIS — E785 Hyperlipidemia, unspecified: Secondary | ICD-10-CM | POA: Diagnosis present

## 2018-04-07 DIAGNOSIS — N12 Tubulo-interstitial nephritis, not specified as acute or chronic: Secondary | ICD-10-CM | POA: Diagnosis not present

## 2018-04-07 DIAGNOSIS — R1084 Generalized abdominal pain: Secondary | ICD-10-CM | POA: Diagnosis not present

## 2018-04-07 DIAGNOSIS — K219 Gastro-esophageal reflux disease without esophagitis: Secondary | ICD-10-CM | POA: Diagnosis present

## 2018-04-07 DIAGNOSIS — E871 Hypo-osmolality and hyponatremia: Secondary | ICD-10-CM | POA: Diagnosis present

## 2018-04-07 DIAGNOSIS — Z8673 Personal history of transient ischemic attack (TIA), and cerebral infarction without residual deficits: Secondary | ICD-10-CM | POA: Diagnosis not present

## 2018-04-07 DIAGNOSIS — I959 Hypotension, unspecified: Secondary | ICD-10-CM | POA: Diagnosis present

## 2018-04-07 DIAGNOSIS — Z7901 Long term (current) use of anticoagulants: Secondary | ICD-10-CM | POA: Diagnosis not present

## 2018-04-07 DIAGNOSIS — I499 Cardiac arrhythmia, unspecified: Secondary | ICD-10-CM | POA: Diagnosis not present

## 2018-04-07 DIAGNOSIS — N10 Acute tubulo-interstitial nephritis: Secondary | ICD-10-CM | POA: Diagnosis not present

## 2018-04-07 DIAGNOSIS — Z79890 Hormone replacement therapy (postmenopausal): Secondary | ICD-10-CM

## 2018-04-07 DIAGNOSIS — E860 Dehydration: Secondary | ICD-10-CM | POA: Diagnosis present

## 2018-04-07 DIAGNOSIS — Z1612 Extended spectrum beta lactamase (ESBL) resistance: Secondary | ICD-10-CM | POA: Diagnosis not present

## 2018-04-07 DIAGNOSIS — N183 Chronic kidney disease, stage 3 (moderate): Secondary | ICD-10-CM | POA: Diagnosis present

## 2018-04-07 DIAGNOSIS — J984 Other disorders of lung: Secondary | ICD-10-CM | POA: Diagnosis not present

## 2018-04-07 DIAGNOSIS — N179 Acute kidney failure, unspecified: Secondary | ICD-10-CM | POA: Diagnosis present

## 2018-04-07 DIAGNOSIS — N136 Pyonephrosis: Secondary | ICD-10-CM | POA: Diagnosis not present

## 2018-04-07 DIAGNOSIS — A498 Other bacterial infections of unspecified site: Secondary | ICD-10-CM | POA: Diagnosis not present

## 2018-04-07 DIAGNOSIS — R4020 Unspecified coma: Secondary | ICD-10-CM | POA: Diagnosis not present

## 2018-04-07 DIAGNOSIS — R7881 Bacteremia: Secondary | ICD-10-CM | POA: Diagnosis present

## 2018-04-07 DIAGNOSIS — B9620 Unspecified Escherichia coli [E. coli] as the cause of diseases classified elsewhere: Secondary | ICD-10-CM | POA: Diagnosis present

## 2018-04-07 DIAGNOSIS — I10 Essential (primary) hypertension: Secondary | ICD-10-CM | POA: Diagnosis present

## 2018-04-07 DIAGNOSIS — R710 Precipitous drop in hematocrit: Secondary | ICD-10-CM | POA: Diagnosis present

## 2018-04-07 DIAGNOSIS — E876 Hypokalemia: Secondary | ICD-10-CM | POA: Diagnosis present

## 2018-04-07 DIAGNOSIS — I480 Paroxysmal atrial fibrillation: Secondary | ICD-10-CM | POA: Diagnosis present

## 2018-04-07 DIAGNOSIS — R531 Weakness: Secondary | ICD-10-CM | POA: Diagnosis not present

## 2018-04-07 DIAGNOSIS — K5730 Diverticulosis of large intestine without perforation or abscess without bleeding: Secondary | ICD-10-CM | POA: Diagnosis not present

## 2018-04-07 LAB — BLOOD CULTURE ID PANEL (REFLEXED)
Acinetobacter baumannii: NOT DETECTED
CANDIDA GLABRATA: NOT DETECTED
CANDIDA KRUSEI: NOT DETECTED
CARBAPENEM RESISTANCE: NOT DETECTED
Candida albicans: NOT DETECTED
Candida parapsilosis: NOT DETECTED
Candida tropicalis: NOT DETECTED
ENTEROCOCCUS SPECIES: NOT DETECTED
ESCHERICHIA COLI: DETECTED — AB
Enterobacter cloacae complex: NOT DETECTED
Enterobacteriaceae species: DETECTED — AB
Haemophilus influenzae: NOT DETECTED
KLEBSIELLA OXYTOCA: NOT DETECTED
Klebsiella pneumoniae: NOT DETECTED
LISTERIA MONOCYTOGENES: NOT DETECTED
Neisseria meningitidis: NOT DETECTED
PROTEUS SPECIES: NOT DETECTED
Pseudomonas aeruginosa: NOT DETECTED
SERRATIA MARCESCENS: NOT DETECTED
STAPHYLOCOCCUS AUREUS BCID: NOT DETECTED
STAPHYLOCOCCUS SPECIES: NOT DETECTED
STREPTOCOCCUS PNEUMONIAE: NOT DETECTED
Streptococcus agalactiae: NOT DETECTED
Streptococcus pyogenes: NOT DETECTED
Streptococcus species: NOT DETECTED

## 2018-04-07 LAB — URINALYSIS, ROUTINE W REFLEX MICROSCOPIC
BILIRUBIN URINE: NEGATIVE
Glucose, UA: NEGATIVE mg/dL
Ketones, ur: NEGATIVE mg/dL
NITRITE: NEGATIVE
Protein, ur: 30 mg/dL — AB
SPECIFIC GRAVITY, URINE: 1.009 (ref 1.005–1.030)
pH: 6 (ref 5.0–8.0)

## 2018-04-07 LAB — CBC WITH DIFFERENTIAL/PLATELET
BASOS ABS: 0 10*3/uL (ref 0.0–0.1)
BASOS PCT: 0 %
EOS ABS: 0 10*3/uL (ref 0.0–0.7)
Eosinophils Relative: 0 %
HEMATOCRIT: 34.4 % — AB (ref 36.0–46.0)
HEMOGLOBIN: 11.5 g/dL — AB (ref 12.0–15.0)
Lymphocytes Relative: 7 %
Lymphs Abs: 0.7 10*3/uL (ref 0.7–4.0)
MCH: 31.1 pg (ref 26.0–34.0)
MCHC: 33.4 g/dL (ref 30.0–36.0)
MCV: 93 fL (ref 78.0–100.0)
MONOS PCT: 8 %
Monocytes Absolute: 1 10*3/uL (ref 0.1–1.0)
NEUTROS ABS: 9.7 10*3/uL — AB (ref 1.7–7.7)
NEUTROS PCT: 85 %
Platelets: 146 10*3/uL — ABNORMAL LOW (ref 150–400)
RBC: 3.7 MIL/uL — ABNORMAL LOW (ref 3.87–5.11)
RDW: 12.9 % (ref 11.5–15.5)
WBC: 11.4 10*3/uL — ABNORMAL HIGH (ref 4.0–10.5)

## 2018-04-07 LAB — GLUCOSE, CAPILLARY: Glucose-Capillary: 168 mg/dL — ABNORMAL HIGH (ref 70–99)

## 2018-04-07 LAB — COMPREHENSIVE METABOLIC PANEL
ALBUMIN: 3.3 g/dL — AB (ref 3.5–5.0)
ALK PHOS: 54 U/L (ref 38–126)
ALT: 20 U/L (ref 0–44)
ANION GAP: 9 (ref 5–15)
AST: 32 U/L (ref 15–41)
BILIRUBIN TOTAL: 1.1 mg/dL (ref 0.3–1.2)
BUN: 27 mg/dL — ABNORMAL HIGH (ref 8–23)
CALCIUM: 9.1 mg/dL (ref 8.9–10.3)
CO2: 23 mmol/L (ref 22–32)
Chloride: 100 mmol/L (ref 98–111)
Creatinine, Ser: 1.78 mg/dL — ABNORMAL HIGH (ref 0.44–1.00)
GFR calc non Af Amer: 25 mL/min — ABNORMAL LOW (ref >60)
GFR, EST AFRICAN AMERICAN: 29 mL/min — AB (ref >60)
Glucose, Bld: 137 mg/dL — ABNORMAL HIGH (ref 70–99)
Potassium: 3.3 mmol/L — ABNORMAL LOW (ref 3.5–5.1)
SODIUM: 132 mmol/L — AB (ref 135–145)
TOTAL PROTEIN: 6.5 g/dL (ref 6.5–8.1)

## 2018-04-07 LAB — POC OCCULT BLOOD, ED: FECAL OCCULT BLD: NEGATIVE

## 2018-04-07 LAB — MAGNESIUM: Magnesium: 1.8 mg/dL (ref 1.7–2.4)

## 2018-04-07 LAB — I-STAT CG4 LACTIC ACID, ED
Lactic Acid, Venous: 0.48 mmol/L — ABNORMAL LOW (ref 0.5–1.9)
Lactic Acid, Venous: 0.83 mmol/L (ref 0.5–1.9)

## 2018-04-07 MED ORDER — INFLUENZA VAC SPLIT HIGH-DOSE 0.5 ML IM SUSY: 0.5000 mL | PREFILLED_SYRINGE | INTRAMUSCULAR | Status: DC

## 2018-04-07 MED ORDER — RIVAROXABAN 15 MG PO TABS
15.0000 mg | ORAL_TABLET | Freq: Every day | ORAL | Status: DC
  Administered 2018-04-07 – 2018-04-09 (×3): 15 mg via ORAL
  Filled 2018-04-07 (×3): qty 1

## 2018-04-07 MED ORDER — GABAPENTIN 300 MG PO CAPS
300.0000 mg | ORAL_CAPSULE | Freq: Every day | ORAL | Status: DC
  Administered 2018-04-07 – 2018-04-09 (×3): 300 mg via ORAL
  Filled 2018-04-07 (×3): qty 1

## 2018-04-07 MED ORDER — POLYVINYL ALCOHOL 1.4 % OP SOLN: 1.0000 [drp] | OPHTHALMIC | Status: DC | PRN

## 2018-04-07 MED ORDER — HYPROMELLOSE (GONIOSCOPIC) 2.5 % OP SOLN
1.0000 [drp] | Freq: Every day | OPHTHALMIC | Status: DC | PRN
  Filled 2018-04-07: qty 15

## 2018-04-07 MED ORDER — POTASSIUM CHLORIDE CRYS ER 10 MEQ PO TBCR
10.0000 meq | EXTENDED_RELEASE_TABLET | Freq: Two times a day (BID) | ORAL | Status: DC
  Administered 2018-04-07 – 2018-04-10 (×7): 10 meq via ORAL
  Filled 2018-04-07 (×9): qty 1

## 2018-04-07 MED ORDER — ACETAMINOPHEN 650 MG RE SUPP
650.0000 mg | Freq: Four times a day (QID) | RECTAL | Status: DC | PRN
  Administered 2018-04-07 – 2018-04-08 (×2): 650 mg via RECTAL
  Filled 2018-04-07 (×2): qty 1

## 2018-04-07 MED ORDER — SODIUM CHLORIDE 0.9 % IV SOLN
1.0000 g | Freq: Once | INTRAVENOUS | Status: AC
  Administered 2018-04-07: 1 g via INTRAVENOUS
  Filled 2018-04-07 (×2): qty 10

## 2018-04-07 MED ORDER — PANTOPRAZOLE SODIUM 40 MG PO TBEC
40.0000 mg | DELAYED_RELEASE_TABLET | Freq: Every day | ORAL | Status: DC
  Administered 2018-04-07 – 2018-04-10 (×4): 40 mg via ORAL
  Filled 2018-04-07 (×4): qty 1

## 2018-04-07 MED ORDER — ALPRAZOLAM 0.25 MG PO TABS
0.2500 mg | ORAL_TABLET | Freq: Three times a day (TID) | ORAL | Status: DC | PRN
  Administered 2018-04-07 – 2018-04-09 (×3): 0.25 mg via ORAL
  Filled 2018-04-07 (×3): qty 1

## 2018-04-07 MED ORDER — SODIUM CHLORIDE 0.9 % IV SOLN
INTRAVENOUS | Status: DC
  Administered 2018-04-07 – 2018-04-08 (×3): via INTRAVENOUS

## 2018-04-07 MED ORDER — POTASSIUM CHLORIDE CRYS ER 20 MEQ PO TBCR
40.0000 meq | EXTENDED_RELEASE_TABLET | Freq: Once | ORAL | Status: AC
  Administered 2018-04-07: 40 meq via ORAL

## 2018-04-07 MED ORDER — LORATADINE 10 MG PO TABS
10.0000 mg | ORAL_TABLET | Freq: Every day | ORAL | Status: DC
  Administered 2018-04-07 – 2018-04-09 (×3): 10 mg via ORAL
  Filled 2018-04-07 (×3): qty 1

## 2018-04-07 MED ORDER — ADULT MULTIVITAMIN W/MINERALS CH
1.0000 | ORAL_TABLET | Freq: Every morning | ORAL | Status: DC
  Administered 2018-04-07 – 2018-04-10 (×4): 1 via ORAL
  Filled 2018-04-07 (×7): qty 1

## 2018-04-07 MED ORDER — SODIUM CHLORIDE 0.9 % IV SOLN
1.0000 g | INTRAVENOUS | Status: DC
  Filled 2018-04-07 (×2): qty 10

## 2018-04-07 MED ORDER — RED YEAST RICE 600 MG PO CAPS: 1200.0000 mg | ORAL_CAPSULE | Freq: Every morning | ORAL | Status: DC

## 2018-04-07 MED ORDER — SODIUM CHLORIDE 0.9 % IV SOLN
1.0000 g | Freq: Once | INTRAVENOUS | Status: AC
  Administered 2018-04-07: 1 g via INTRAVENOUS
  Filled 2018-04-07: qty 10

## 2018-04-07 MED ORDER — SODIUM CHLORIDE 0.9 % IV BOLUS
1000.0000 mL | Freq: Once | INTRAVENOUS | Status: AC
  Administered 2018-04-07: 1000 mL via INTRAVENOUS

## 2018-04-07 MED ORDER — ONDANSETRON HCL 4 MG/2ML IJ SOLN: 4.0000 mg | Freq: Four times a day (QID) | INTRAMUSCULAR | Status: DC | PRN

## 2018-04-07 MED ORDER — ACETAMINOPHEN 500 MG PO TABS
1000.0000 mg | ORAL_TABLET | Freq: Once | ORAL | Status: AC
  Administered 2018-04-07: 1000 mg via ORAL
  Filled 2018-04-07: qty 2

## 2018-04-07 MED ORDER — ACETAMINOPHEN 325 MG PO TABS
650.0000 mg | ORAL_TABLET | Freq: Four times a day (QID) | ORAL | Status: DC | PRN
  Administered 2018-04-09: 650 mg via ORAL
  Filled 2018-04-07: qty 2

## 2018-04-07 MED ORDER — OMEGA-3-ACID ETHYL ESTERS 1 G PO CAPS
2.0000 g | ORAL_CAPSULE | Freq: Every morning | ORAL | Status: DC
  Administered 2018-04-07 – 2018-04-10 (×4): 2 g via ORAL
  Filled 2018-04-07 (×7): qty 2

## 2018-04-07 MED ORDER — SODIUM CHLORIDE 0.9 % IV SOLN
2.0000 g | INTRAVENOUS | Status: DC
  Filled 2018-04-07 (×3): qty 20

## 2018-04-07 MED ORDER — ONDANSETRON HCL 4 MG PO TABS: 4.0000 mg | ORAL_TABLET | Freq: Four times a day (QID) | ORAL | Status: DC | PRN

## 2018-04-07 MED ORDER — POTASSIUM CHLORIDE CRYS ER 20 MEQ PO TBCR
40.0000 meq | EXTENDED_RELEASE_TABLET | Freq: Once | ORAL | Status: DC
  Filled 2018-04-07: qty 2

## 2018-04-07 NOTE — Progress Notes (Signed)
Received verbal order from Dr. Roxan Hockey for IV Rocephin 1 gram IV now. And also to change her 1 gram IV rocephin Q 24 to 2 grams every 24 hours.

## 2018-04-07 NOTE — ED Notes (Signed)
Pt resting eyes closed.  VS WNL.

## 2018-04-07 NOTE — ED Notes (Signed)
Pt c/o muscle aches in legs.  Notified edp and tylenol given.  Pt also requesting bedpan.  Placed pt on bedpan but unable to void at this time.

## 2018-04-07 NOTE — ED Notes (Signed)
cbg with ems was 122

## 2018-04-07 NOTE — Progress Notes (Signed)
CRITICAL VALUE ALERT  Critical Value:  Pt has E. Coli in blood cultures per [facility] lab  Date & Time Notied:  04/07/18 at 1727  Provider Notified: Maurene Capes  Orders Received/Actions taken: awaiting further orders

## 2018-04-07 NOTE — Progress Notes (Signed)
Patient seen and evaluated, chart reviewed, please see EMR for updated orders. Please see full H&P dictated by admitting physician Dr Manuella Ghazi for same date of service.    84 year old admitted with lethargy, altered mentation, found to have right-sided pyelonephritis, currently on IV Rocephin, patient is more awake now more talkative,.  Await cultures to make further antibiotic decisions  AKI- Creatinine is up to 1.78,  CT abd/Pelvis with Mild bilateral hydroureteronephrosis with perinephric edema, left greater than right, renal ultrasound pending  Patient seen and evaluated, chart reviewed, please see EMR for updated orders. Please see full H&P dictated by admitting physician Dr Manuella Ghazi for same date of service.

## 2018-04-07 NOTE — ED Notes (Signed)
ED Provider at bedside. 

## 2018-04-07 NOTE — ED Triage Notes (Signed)
Pt arrived to er by ems after family had problems getting pt awake, upon ems arrival they were able to wake pt up, pt alert, able to answer questions, c/o lower abd pain, family reported to ems that they were concerned about pt having an uti, upon arrival to er, pt alert, able to answer questions, continues to have lower abd pain, clothes damp, pt changed into hospital gown,

## 2018-04-07 NOTE — ED Notes (Signed)
Pt having chills.  Rectal temp checked and is wnl.    Warm blanket and socks given.  Hospitalist here to see pt.

## 2018-04-07 NOTE — ED Provider Notes (Addendum)
Becky Provider Note   CSN: [id number] Arrival date & time: 04/07/18  0138     History   Chief Complaint Chief Complaint  Patient presents with  . Altered Mental Status    HPI Becky Baird is a 84 y.o. female.  Patient from home with fever and altered mental status.  Patient was doing well until about 7 PM last evening when she began to feel hot and warm all over.  And was having shaking chills.  Her family checked her temperature and was 104.  She was given some Tylenol and went to bed.  When her family tried to wake her up at 66 PM to recheck her temperature they difficulty arousing her and EMS was called.  Patient was arousable for EMS was able to answer questions.  Patient complains of intermittent lower abdominal pain for the past several days but denies any dysuria hematuria.  No nausea or vomiting.  No diarrhea.  No chest pain or shortness of breath.  Denies any falls or trauma.  No headache, cough or sore throat or runny nose.  Good p.o. intake yesterday but decreased urine output.  Family reports has had UTIs in the past. History of atrial fibrillation on Xarelto, previous stroke, hypertension and hyperlipidemia.  The history is provided by the patient, the EMS personnel and a relative.    Past Medical History:  Diagnosis Date  . Anxiety   . Chronic back pain   . Hyperlipidemia   . Hypertension   . Memory loss   . Mild cognitive impairment, so stated   . Occlusion and stenosis of carotid artery with cerebral infarction    Bilateral less than 50% carotid stenosis.  . Persistent atrial fibrillation ([place]) 06/23/2016  . Renal cyst   . Sciatica   . Stroke ([place])    2010, RMCA with stent  . Toe fracture 11/2015   R toe  . Ulcer 1970s    Patient Active Problem List   Diagnosis Date Noted  . Acute right-sided low back pain with right-sided sciatica 11/24/2017  . Unsteady gait 11/24/2017  . Primary osteoarthritis of both hands 11/13/2017    . Paroxysmal atrial fibrillation ([place]) 11/23/2016  . Murmur 11/23/2016  . TIA (transient ischemic attack) 05/12/2016  . Altered mental status   . Urinary tract infection without hematuria   . History of colonic polyps 05/10/2016  . History of stroke 03/03/2015  . GERD (gastroesophageal reflux disease) 02/06/2015  . Aortic sclerosis 12/02/2014  . Osteopenia 01/14/2014  . Prediabetes 08/05/2013  . Hypercalcemia 04/22/2013  . Renal insufficiency 02/15/2013  . Mild cognitive impairment 01/03/2013  . Memory loss 01/03/2013  . Ulcer 10/12/2012  . Hypertension 10/12/2012  . Hyperlipidemia 10/12/2012  . Cerebral infarction ([place]) 10/12/2012  . HIP PAIN, RIGHT 12/29/2009    Past Surgical History:  Procedure Laterality Date  . COLONOSCOPY  03/10/2011   Procedure: COLONOSCOPY;  Surgeon: Rogene Houston, MD;  [facility];  Service: Endoscopy;  Laterality: N/A;  . COLONOSCOPY N/A 08/24/2016   Procedure: COLONOSCOPY;  Surgeon: Rogene Houston, MD;  Location: [facility];  Service: Endoscopy;  Laterality: N/A;  930  . DILATION AND CURETTAGE OF UTERUS     years ago  . OVARIAN CYST REMOVAL     at least 10 years ago  . POLYPECTOMY  08/24/2016   Procedure: POLYPECTOMY;  Surgeon: Rogene Houston, MD;  Location: [facility];  Service: Endoscopy;;  ascending colon; hepatic flexure  .  TONSILLECTOMY     childhood     OB History   None      Home Medications    Prior to Admission medications   Medication Sig Start Date End Date Taking? Authorizing Provider  ALPRAZolam (XANAX) 0.5 MG tablet TAKE 1/2 TABLET BY MOUTH EVERY 6 HOURS AS NEEDED FOR ANXIETY AND TAKE 1/2 TO 1 TABLET AT BEDTIME AS NEEDED FOR SLEEP 03/12/18   Luking, Elayne Snare, MD  carboxymethylcellulose (REFRESH PLUS) 0.5 % SOLN Place 1 drop into both eyes daily as needed (dry eyes).    [provider]  clobetasol ointment (TEMOVATE) 4.81 % Apply 1 application topically 2 (two) times daily as needed.    [provider]  estradiol (ESTRACE) 0.1 MG/GM vaginal cream Place 1 Applicatorful vaginally once a week.    [provider]  fish oil-omega-3 fatty acids 1000 MG capsule Take 2 g by mouth every morning.     [provider]  gabapentin (NEURONTIN) 300 MG capsule Take 1 capsule (300 mg total) by mouth at bedtime. 02/16/18   Kathyrn Drown, MD  hydrochlorothiazide (HYDRODIURIL) 25 MG tablet TAKE ONE TABLET BY MOUTH DAILY 09/07/17   Kathyrn Drown, MD  Iron-FA-B Cmp-C-Biot-Probiotic (FUSION PLUS) CAPS Take 1 capsule by mouth every morning.  05/12/14   [provider]  loratadine (CLARITIN) 10 MG tablet Take 1 tablet (10 mg total) by mouth daily. Patient taking differently: Take 10 mg by mouth at bedtime.  02/06/15   Kathyrn Drown, MD  OVER THE COUNTER MEDICATION Take 1 capsule by mouth every morning. [place]    [provider]  pantoprazole (PROTONIX) 40 MG tablet TAKE ONE TABLET BY MOUTH DAILY. 12/06/17   Kathyrn Drown, MD  potassium chloride (K-DUR,KLOR-CON) 10 MEQ tablet TAKE ONE TABLET BY MOUTH TWICE DAILY 01/01/18   Luking, Elayne Snare, MD  Red Yeast Rice 600 MG CAPS Take 1,200 mg by mouth every morning.     [provider]  Rivaroxaban (XARELTO) 15 MG TABS tablet Take 1 tablet (15 mg total) by mouth daily with supper. 12/25/17   Minus Breeding, MD  sodium chloride (OCEAN) 0.65 % nasal spray Place 1 spray into the nose as needed for congestion.     [provider]  potassium chloride (K-DUR) 10 MEQ tablet Take 1 tablet (10 mEq total) by mouth 2 (two) times daily. 05/22/13 10/12/13  Kathyrn Drown, MD    Family History Family History  Problem Relation Age of Onset  . Stroke Mother   . Heart failure Father   . Diabetes Brother     Social History Social History   Tobacco Use  . Smoking status: Never Smoker  . Smokeless tobacco: Never Used  Substance Use Topics  . Alcohol use: No    Alcohol/week: 0.0 standard drinks  . Drug  use: No     Allergies   Amlodipine and Niaspan [niacin er]   Review of Systems Review of Systems  Constitutional: Positive for activity change, appetite change, chills, diaphoresis, fatigue and fever.  Eyes: Positive for visual disturbance.  Respiratory: Negative for cough, chest tightness and shortness of breath.   Cardiovascular: Negative for chest pain.  Gastrointestinal: Positive for abdominal pain. Negative for constipation, nausea and vomiting.  Genitourinary: Positive for decreased urine volume. Negative for dysuria, flank pain, hematuria and pelvic pain.  Musculoskeletal: Positive for arthralgias and neck pain.  Neurological: Positive for weakness. Negative for dizziness and headaches.   all other systems are  negative except as noted in the HPI and PMH.     Physical Exam Updated Vital Signs BP (!) 114/49   Pulse 67   Temp 98 F (36.7 C) (Oral)   Resp 15   Ht 5\' 7"  (1.702 m)   Wt 64.9 kg   SpO2 97%   BMI 22.40 kg/m   Physical Exam  Constitutional: She is oriented to person, place, and time. She appears well-developed and well-nourished. No distress.  Alert, oriented to person and place, somewhat slow to respond but answers questions appropriately  HENT:  Head: Normocephalic and atraumatic.  Mouth/Throat: Oropharynx is clear and moist. No oropharyngeal exudate.  Eyes: Pupils are equal, round, and reactive to light. Conjunctivae and EOM are normal.  Neck: Normal range of motion. Neck supple.  No meningismus.  Cardiovascular: Normal rate, regular rhythm, normal heart sounds and intact distal pulses.  No murmur heard. Pulmonary/Chest: Effort normal and breath sounds normal. No respiratory distress. She exhibits no tenderness.  Abdominal: Soft. There is tenderness. There is no rebound and no guarding.  Mild diffuse tenderness, suprapubic and left lower quadrant more so No guarding or rebound  Musculoskeletal: Normal range of motion. She exhibits tenderness. She  exhibits no edema.  Paraspinal lumbar tenderness bilaterally  Neurological: She is alert and oriented to person, place, and time. No cranial nerve deficit. She exhibits normal muscle tone. Coordination normal.  No ataxia on finger to nose bilaterally. No pronator drift. 5/5 strength throughout. CN 2-12 intact.Equal grip strength. Sensation intact.   Skin: Skin is warm.  Psychiatric: She has a normal mood and affect. Her behavior is normal.  Nursing note and vitals reviewed.    ED Treatments / Results  Labs (all labs ordered are listed, but only abnormal results are displayed) Labs Reviewed  CBC WITH DIFFERENTIAL/PLATELET - Abnormal; Notable for the following components:      Result Value   WBC 11.4 (*)    RBC 3.70 (*)    Hemoglobin 11.5 (*)    HCT 34.4 (*)    Platelets 146 (*)    Neutro Abs 9.7 (*)    All other components within normal limits  COMPREHENSIVE METABOLIC PANEL - Abnormal; Notable for the following components:   Sodium 132 (*)    Potassium 3.3 (*)    Glucose, Bld 137 (*)    BUN 27 (*)    Creatinine, Ser 1.78 (*)    Albumin 3.3 (*)    GFR calc non Af Amer 25 (*)    GFR calc Af Amer 29 (*)    All other components within normal limits  URINALYSIS, ROUTINE W REFLEX MICROSCOPIC - Abnormal; Notable for the following components:   APPearance CLOUDY (*)    Hgb urine dipstick MODERATE (*)    Protein, ur 30 (*)    Leukocytes, UA LARGE (*)    Bacteria, UA FEW (*)    All other components within normal limits  I-STAT CG4 LACTIC ACID, ED - Abnormal; Notable for the following components:   Lactic Acid, Venous 0.48 (*)    All other components within normal limits  CULTURE, BLOOD (ROUTINE X 2)  CULTURE, BLOOD (ROUTINE X 2)  URINE CULTURE  I-STAT CG4 LACTIC ACID, ED  POC OCCULT BLOOD, ED   ED ECG REPORT   Date: 04/07/2018  Rate: 60  Rhythm: normal sinus rhythm  QRS Axis: normal  Intervals: normal  ST/T Wave abnormalities: nonspecific ST changes  Conduction  Disutrbances:none  Narrative Interpretation: slight ST elevation inferior and laterally  similar to previous  Old EKG Reviewed: unchanged  I have personally reviewed the EKG tracing and agree with the computerized printout as noted.   EKG  EKG Interpretation  Date/Time:    Ventricular Rate:    PR Interval:    QRS Duration:   QT Interval:    QTC Calculation:   R Axis:     Text Interpretation:          Radiology Ct Abdomen Pelvis Wo Contrast  Result Date: 04/07/2018 CLINICAL DATA:  Lower abdominal pain and fever. EXAM: CT ABDOMEN AND PELVIS WITHOUT CONTRAST TECHNIQUE: Multidetector CT imaging of the abdomen and pelvis was performed following the standard protocol without IV contrast. COMPARISON:  CT 10/17/2007 FINDINGS: Lower chest: Mild hypoventilatory atelectasis. No pleural fluid. Coronary artery calcifications. Hepatobiliary: No focal liver abnormality is seen. No gallstones, gallbladder wall thickening, or biliary dilatation. Pancreas: No ductal dilatation or inflammation. Spleen: Calcified granuloma.  Normal in size. Adrenals/Urinary Tract: Mild left adrenal thickening without dominant nodule. Normal right adrenal gland. Mild left hydroureteronephrosis with moderate perinephric and periureteric stranding. No urolithiasis or cause of obstruction. Mild right hydroureteronephrosis with perinephric and periureteric edema. Simple cyst in the upper right kidney measures 6.6 cm. Urinary bladder is distended with mild wall thickening about the dome. No emphysematous changes are air in the collecting system. No evidence of perirenal fluid collection on noncontrast exam. Stomach/Bowel: Stomach is nondistended. Lack of enteric contrast limits bowel evaluation. No evidence of bowel wall thickening, inflammatory change or obstruction. Colonic diverticulosis, prominent in the distal descending and sigmoid without diverticulitis. Normal appendix. Vascular/Lymphatic: Mild aorta bi-iliac atherosclerosis.  Prominent retroperitoneal nodes are likely reactive. No bulky adenopathy. Reproductive: Uterus and bilateral adnexa are unremarkable. Other: No free air, free fluid, or intra-abdominal fluid collection. Musculoskeletal: Scoliosis and degenerative change throughout spine. Bones are under mineralized. Suspect remote left sacral insufficiency fracture. IMPRESSION: 1. Mild bilateral hydroureteronephrosis with perinephric edema, left greater than right. No obstructing stones. Findings suspicious for pyelonephritis. No evidence of perirenal abscess on noncontrast exam. 2. Colonic diverticulosis without diverticulitis. Aortic Atherosclerosis (ICD10-I70.0). Electronically Signed   By: Keith Rake M.D.   On: 04/07/2018 03:57   Dg Chest 2 View  Result Date: 04/07/2018 CLINICAL DATA:  Altered mental status. EXAM: CHEST - 2 VIEW COMPARISON:  Radiograph 05/12/2016 FINDINGS: Lower lung volumes from prior exam.The cardiomediastinal contours are normal. Mild scarring in the right midlung. Pulmonary vasculature is normal. No consolidation, pleural effusion, or pneumothorax. No acute osseous abnormalities are seen. IMPRESSION: No acute chest finding. Electronically Signed   By: Keith Rake M.D.   On: 04/07/2018 03:58   Ct Head Wo Contrast  Result Date: 04/07/2018 CLINICAL DATA:  Altered level of consciousness. EXAM: CT HEAD WITHOUT CONTRAST TECHNIQUE: Contiguous axial images were obtained from the base of the skull through the vertex without intravenous contrast. COMPARISON:  Head CT and brain MRI 05/12/2016 FINDINGS: Brain: No acute hemorrhage. Unchanged atrophy and chronic small vessel ischemia. Unchanged remote infarct in right MCA distribution, and chronic lacunar infarcts in the right caudate. No evidence of acute ischemia. No hydrocephalus, the basilar cisterns are patent. No subdural collection. Vascular: Stent in the right MCA.  No hyperdense vessel. Skull: No fracture or focal lesion. Sinuses/Orbits:  Paranasal sinuses and mastoid air cells are clear. The visualized orbits are unremarkable. Other: None. IMPRESSION: 1.  No acute intracranial abnormality. 2. Unchanged atrophy, chronic small vessel ischemia, prior right MCA infarcts and stent. Electronically Signed   By: Aurther Loft.D.  On: 04/07/2018 03:47    Procedures Procedures (including critical care time)  Medications Ordered in ED Medications - No data to display   Initial Impression / Assessment and Plan / ED Course  I have reviewed the triage vital signs and the nursing notes.  Pertinent labs & imaging results that were available during my care of the patient were reviewed by me and considered in my medical decision making (see chart for details).    Fever with altered mental status and decreased p.o. Intake. Patient will be hydrated, labs will be obtained, urinalysis, chest x-ray and abdominal CT given her abdominal discomfort.  Patient appears dry on exam.  IV fluids will be given.  CT head is negative.  Labs show AKI, leukocytosis.  Lactate is normal.  Blood and urine culture sent.  CT scan concerning for pyelonephritis with perinephric stranding.  No ureteral stones.  No abscess seen.  Patient and family agreeable to admission for IV fluids and IV antibiotics. They feel patient still remains somewhat confused and are agreeable to admission. D/w Dr. Manuella Ghazi.  Final Clinical Impressions(s) / ED Diagnoses   Final diagnoses:  Pyelonephritis  Dehydration    ED Discharge Orders    None       Daryana Whirley, Annie Main, MD 04/07/18 [id number]    Ezequiel Essex, MD 04/07/18 [phone]

## 2018-04-07 NOTE — H&P (Signed)
History and Physical    Becky Baird NFA:[id number] DOB: Aug 12, 1933 DOA: 04/07/2018  PCP: Kathyrn Drown, MD   Patient coming from: Home  Chief Complaint: AMS/fever/chills  HPI: Becky Baird is a 84 y.o. female with medical history significant for dyslipidemia, hypertension, prior CVA with mild cognitive impairment, anxiety, CKD stage III and paroxysmal atrial fibrillation on Xarelto, who was brought to the ED with fever and shaking chills with temperature 104 Fahrenheit at home.  Her daughters at home are concerned about the fact that she was quite somnolent.  They had difficulty arousing her at home and called EMS.  Upon arrival, EMS noted the patient was quite arousable and was able to easily answer questions.  She complained of some intermittent lower abdominal pain, but did not have any dysuria or hematuria, nausea, vomiting, or diarrhea.  She denies any chest pain, or shortness of breath.  She continues to take her medications as prescribed, but has had some questionable oral intake with diminished appetite.     ED Course: Vital signs are stable and laboratory data indicates mild leukocytosis of 11,400, hemoglobin of 11.5, platelets of 146, sodium 132, potassium 3.3, BUN 27, and creatinine 1.78 with usual baseline of approximately 1.3.  She is noted to have findings of UTI on urine analysis and CT of the abdomen and pelvis demonstrates some findings of pyelonephritis along with bilateral hydroureteronephrosis.  Two-view chest x-ray and CT of the head with no acute findings.  Review of Systems: All others reviewed and otherwise negative.  Past Medical History:  Diagnosis Date  . Anxiety   . Chronic back pain   . Hyperlipidemia   . Hypertension   . Memory loss   . Mild cognitive impairment, so stated   . Occlusion and stenosis of carotid artery with cerebral infarction    Bilateral less than 50% carotid stenosis.  . Persistent atrial fibrillation ([place]) 06/23/2016  . Renal cyst    . Sciatica   . Stroke ([place])    2010, RMCA with stent  . Toe fracture 11/2015   R toe  . Ulcer 1970s    Past Surgical History:  Procedure Laterality Date  . COLONOSCOPY  03/10/2011   Procedure: COLONOSCOPY;  Surgeon: Rogene Houston, MD;  [facility];  Service: Endoscopy;  Laterality: N/A;  . COLONOSCOPY N/A 08/24/2016   Procedure: COLONOSCOPY;  Surgeon: Rogene Houston, MD;  Location: [facility];  Service: Endoscopy;  Laterality: N/A;  930  . DILATION AND CURETTAGE OF UTERUS     years ago  . OVARIAN CYST REMOVAL     at least 10 years ago  . POLYPECTOMY  08/24/2016   Procedure: POLYPECTOMY;  Surgeon: Rogene Houston, MD;  Location: [facility];  Service: Endoscopy;;  ascending colon; hepatic flexure  . TONSILLECTOMY     childhood     reports that she has never smoked. She has never used smokeless tobacco. She reports that she does not drink alcohol or use drugs.  Allergies  Allergen Reactions  . Amlodipine     Pedal edema  . Niaspan [Niacin Er] Rash    Family History  Problem Relation Age of Onset  . Stroke Mother   . Heart failure Father   . Diabetes Brother     Prior to Admission medications   Medication Sig Start Date End Date Taking? Authorizing Provider  ALPRAZolam (XANAX) 0.5 MG tablet TAKE 1/2 TABLET BY MOUTH EVERY 6 HOURS AS NEEDED FOR ANXIETY AND  TAKE 1/2 TO 1 TABLET AT BEDTIME AS NEEDED FOR SLEEP 03/12/18   Kathyrn Drown, MD  carboxymethylcellulose (REFRESH PLUS) 0.5 % SOLN Place 1 drop into both eyes daily as needed (dry eyes).    [provider]  clobetasol ointment (TEMOVATE) 2.68 % Apply 1 application topically 2 (two) times daily as needed.    [provider]  estradiol (ESTRACE) 0.1 MG/GM vaginal cream Place 1 Applicatorful vaginally once a week.    [provider]  fish oil-omega-3 fatty acids 1000 MG capsule Take 2 g by mouth every morning.     [provider]  gabapentin (NEURONTIN) 300 MG capsule  Take 1 capsule (300 mg total) by mouth at bedtime. 02/16/18   Kathyrn Drown, MD  hydrochlorothiazide (HYDRODIURIL) 25 MG tablet TAKE ONE TABLET BY MOUTH DAILY 09/07/17   Kathyrn Drown, MD  Iron-FA-B Cmp-C-Biot-Probiotic (FUSION PLUS) CAPS Take 1 capsule by mouth every morning.  05/12/14   [provider]  loratadine (CLARITIN) 10 MG tablet Take 1 tablet (10 mg total) by mouth daily. Patient taking differently: Take 10 mg by mouth at bedtime.  02/06/15   Kathyrn Drown, MD  OVER THE COUNTER MEDICATION Take 1 capsule by mouth every morning. [place]    [provider]  pantoprazole (PROTONIX) 40 MG tablet TAKE ONE TABLET BY MOUTH DAILY. 12/06/17   Kathyrn Drown, MD  potassium chloride (K-DUR,KLOR-CON) 10 MEQ tablet TAKE ONE TABLET BY MOUTH TWICE DAILY 01/01/18   Luking, Elayne Snare, MD  Red Yeast Rice 600 MG CAPS Take 1,200 mg by mouth every morning.     [provider]  Rivaroxaban (XARELTO) 15 MG TABS tablet Take 1 tablet (15 mg total) by mouth daily with supper. 12/25/17   Minus Breeding, MD  sodium chloride (OCEAN) 0.65 % nasal spray Place 1 spray into the nose as needed for congestion.     [provider]  potassium chloride (K-DUR) 10 MEQ tablet Take 1 tablet (10 mEq total) by mouth 2 (two) times daily. 05/22/13 10/12/13  Kathyrn Drown, MD    Physical Exam: Vitals:   04/07/18 0400 04/07/18 0500 04/07/18 0530 04/07/18 0547  BP: (!) 111/47 110/67    Pulse: (!) 48 65    Resp: (!) 21     Temp:   97.6 F (36.4 C) 98.2 F (36.8 C)  TempSrc:   Oral Rectal  SpO2: (!) 86% 100%    Weight:      Height:        Constitutional: NAD, calm, comfortable, mild shaking with chills Vitals:   04/07/18 0400 04/07/18 0500 04/07/18 0530 04/07/18 0547  BP: (!) 111/47 110/67    Pulse: (!) 48 65    Resp: (!) 21     Temp:   97.6 F (36.4 C) 98.2 F (36.8 C)  TempSrc:   Oral Rectal  SpO2: (!) 86% 100%    Weight:      Height:       Eyes: lids and  conjunctivae normal ENMT: Mucous membranes are moist.  Neck: normal, supple Respiratory: clear to auscultation bilaterally. Normal respiratory effort. No accessory muscle use.  Cardiovascular: Regular rate and rhythm, no murmurs. No extremity edema. Abdomen: Mild generalized tenderness, no distention. Bowel sounds positive.  Musculoskeletal:  No joint deformity upper and lower extremities.   Skin: no rashes, lesions, ulcers.  Psychiatric: Normal judgment and insight. Alert and oriented x 3. Normal mood.   Labs on Admission: I have personally reviewed  following labs and imaging studies  CBC: Recent Labs  Lab 04/07/18 0220  WBC 11.4*  NEUTROABS 9.7*  HGB 11.5*  HCT 34.4*  MCV 93.0  PLT 542*   Basic Metabolic Panel: Recent Labs  Lab 04/07/18 0220  NA 132*  K 3.3*  CL 100  CO2 23  GLUCOSE 137*  BUN 27*  CREATININE 1.78*  CALCIUM 9.1   GFR: Estimated Creatinine Clearance: 23.7 mL/min (A) (by C-G formula based on SCr of 1.78 mg/dL (H)). Liver Function Tests: Recent Labs  Lab 04/07/18 0220  AST 32  ALT 20  ALKPHOS 54  BILITOT 1.1  PROT 6.5  ALBUMIN 3.3*   No results for input(s): LIPASE, AMYLASE in the last 168 hours. No results for input(s): AMMONIA in the last 168 hours. Coagulation Profile: No results for input(s): INR, PROTIME in the last 168 hours. Cardiac Enzymes: No results for input(s): CKTOTAL, CKMB, CKMBINDEX, TROPONINI in the last 168 hours. BNP (last 3 results) No results for input(s): PROBNP in the last 8760 hours. HbA1C: No results for input(s): HGBA1C in the last 72 hours. CBG: No results for input(s): GLUCAP in the last 168 hours. Lipid Profile: No results for input(s): CHOL, HDL, LDLCALC, TRIG, CHOLHDL, LDLDIRECT in the last 72 hours. Thyroid Function Tests: No results for input(s): TSH, T4TOTAL, FREET4, T3FREE, THYROIDAB in the last 72 hours. Anemia Panel: No results for input(s): VITAMINB12, FOLATE, FERRITIN, TIBC, IRON, RETICCTPCT in the  last 72 hours. Urine analysis:    Component Value Date/Time   COLORURINE YELLOW 04/07/2018 0403   APPEARANCEUR CLOUDY (A) 04/07/2018 0403   LABSPEC 1.009 04/07/2018 0403   PHURINE 6.0 04/07/2018 0403   GLUCOSEU NEGATIVE 04/07/2018 0403   HGBUR MODERATE (A) 04/07/2018 0403   BILIRUBINUR NEGATIVE 04/07/2018 0403   BILIRUBINUR Neg 07/15/2016 1439   KETONESUR NEGATIVE 04/07/2018 0403   PROTEINUR 30 (A) 04/07/2018 0403   UROBILINOGEN 0.2 07/15/2016 1439   UROBILINOGEN 0.2 03/05/2015 1513   NITRITE NEGATIVE 04/07/2018 0403   LEUKOCYTESUR LARGE (A) 04/07/2018 0403    Radiological Exams on Admission: Ct Abdomen Pelvis Wo Contrast  Result Date: 04/07/2018 CLINICAL DATA:  Lower abdominal pain and fever. EXAM: CT ABDOMEN AND PELVIS WITHOUT CONTRAST TECHNIQUE: Multidetector CT imaging of the abdomen and pelvis was performed following the standard protocol without IV contrast. COMPARISON:  CT 10/17/2007 FINDINGS: Lower chest: Mild hypoventilatory atelectasis. No pleural fluid. Coronary artery calcifications. Hepatobiliary: No focal liver abnormality is seen. No gallstones, gallbladder wall thickening, or biliary dilatation. Pancreas: No ductal dilatation or inflammation. Spleen: Calcified granuloma.  Normal in size. Adrenals/Urinary Tract: Mild left adrenal thickening without dominant nodule. Normal right adrenal gland. Mild left hydroureteronephrosis with moderate perinephric and periureteric stranding. No urolithiasis or cause of obstruction. Mild right hydroureteronephrosis with perinephric and periureteric edema. Simple cyst in the upper right kidney measures 6.6 cm. Urinary bladder is distended with mild wall thickening about the dome. No emphysematous changes are air in the collecting system. No evidence of perirenal fluid collection on noncontrast exam. Stomach/Bowel: Stomach is nondistended. Lack of enteric contrast limits bowel evaluation. No evidence of bowel wall thickening, inflammatory change  or obstruction. Colonic diverticulosis, prominent in the distal descending and sigmoid without diverticulitis. Normal appendix. Vascular/Lymphatic: Mild aorta bi-iliac atherosclerosis. Prominent retroperitoneal nodes are likely reactive. No bulky adenopathy. Reproductive: Uterus and bilateral adnexa are unremarkable. Other: No free air, free fluid, or intra-abdominal fluid collection. Musculoskeletal: Scoliosis and degenerative change throughout spine. Bones are under mineralized. Suspect remote left sacral insufficiency fracture. IMPRESSION: 1.  Mild bilateral hydroureteronephrosis with perinephric edema, left greater than right. No obstructing stones. Findings suspicious for pyelonephritis. No evidence of perirenal abscess on noncontrast exam. 2. Colonic diverticulosis without diverticulitis. Aortic Atherosclerosis (ICD10-I70.0). Electronically Signed   By: Keith Rake M.D.   On: 04/07/2018 03:57   Dg Chest 2 View  Result Date: 04/07/2018 CLINICAL DATA:  Altered mental status. EXAM: CHEST - 2 VIEW COMPARISON:  Radiograph 05/12/2016 FINDINGS: Lower lung volumes from prior exam.The cardiomediastinal contours are normal. Mild scarring in the right midlung. Pulmonary vasculature is normal. No consolidation, pleural effusion, or pneumothorax. No acute osseous abnormalities are seen. IMPRESSION: No acute chest finding. Electronically Signed   By: Keith Rake M.D.   On: 04/07/2018 03:58   Ct Head Wo Contrast  Result Date: 04/07/2018 CLINICAL DATA:  Altered level of consciousness. EXAM: CT HEAD WITHOUT CONTRAST TECHNIQUE: Contiguous axial images were obtained from the base of the skull through the vertex without intravenous contrast. COMPARISON:  Head CT and brain MRI 05/12/2016 FINDINGS: Brain: No acute hemorrhage. Unchanged atrophy and chronic small vessel ischemia. Unchanged remote infarct in right MCA distribution, and chronic lacunar infarcts in the right caudate. No evidence of acute ischemia. No  hydrocephalus, the basilar cisterns are patent. No subdural collection. Vascular: Stent in the right MCA.  No hyperdense vessel. Skull: No fracture or focal lesion. Sinuses/Orbits: Paranasal sinuses and mastoid air cells are clear. The visualized orbits are unremarkable. Other: None. IMPRESSION: 1.  No acute intracranial abnormality. 2. Unchanged atrophy, chronic small vessel ischemia, prior right MCA infarcts and stent. Electronically Signed   By: Keith Rake M.D.   On: 04/07/2018 03:47    EKG: Independently reviewed.  Sinus rhythm at 60 bpm with borderline ST elevation in lateral leads which is unchanged from prior.  Assessment/Plan Principal Problem:   Acute pyelonephritis Active Problems:   Hypertension   Hyperlipidemia   History of stroke   Paroxysmal atrial fibrillation ([facility])   Acute metabolic encephalopathy   AKI (acute kidney injury) ([place])   Hypokalemia   Hyponatremia    1. Acute pyelonephritis.  Continue on Rocephin as well as IV fluid with symptomatic management with pain medications to include IV Dilaudid for breakthrough pain as well as Zofran for nausea and vomiting.  Urine cultures have been sent. 2. AKI on CKD stage III with dehydration.  Suspect prerenal cause, however patient does have some bilateral hydroureteronephrosis.  Will order ultrasound renal to further evaluate.  No obstructive nephrolithiasis otherwise noted.  Continue to monitor and repeat lab work and maintain on IV fluid well with holding any nephrotoxic agents. 3. Mild hyponatremia and hypokalemia.  Continue IV fluid with normal saline as well as potassium supplementation.  Recheck labs and follow with magnesium as well. 4. Acute metabolic encephalopathy-resolving.  Patient appears to be back to her usual baseline at this point and I feel that she may have been more somnolent and altered related to the above factors.  Continue to monitor with neurochecks as needed. 5. Paroxysmal atrial fibrillation on  Xarelto.  Continue Xarelto and monitor on telemetry.  Currently rate controlled and does not appear to take any rate control medications. 6. Dyslipidemia.  Continue on omega-3 as well as red yeast rice. 7. Mild anemia-stable.  No overt bleeding identified.  Monitor CBC.  Continue home iron supplementation. 8. GERD.  Continue on Protonix.   DVT prophylaxis: Continue Xarelto Code Status: Full Family Communication: 2 daughters at bedside Disposition Plan: Admission for treatment of pyelonephritis and AKI with IV  fluid and Rocephin Consults called: None Admission status: Inpatient, telemetry   [facility] Pager [phone]  If 7PM-7AM, please contact night-coverage [email or web address] Password [email or web address]  04/07/2018, 5:59 AM

## 2018-04-07 NOTE — Progress Notes (Signed)
PHARMACIST - PHYSICIAN ORDER COMMUNICATION  CONCERNING: P&T Medication Policy on Herbal Medications  DESCRIPTION:  This patient's order for:  Red yeast rice  has been noted.  This product(s) is classified as an "herbal" or natural product. Due to a lack of definitive safety studies or FDA approval, nonstandard manufacturing practices, plus the potential risk of unknown drug-drug interactions while on inpatient medications, the Pharmacy and Therapeutics Committee does not permit the use of "herbal" or natural products of this type within [place].    ACTION TAKEN: The pharmacy department is unable to verify this order at this time and your patient has been informed of this safety policy. Please reevaluate patient's clinical condition at discharge and address if the herbal or natural product(s) should be resumed at that time.   

## 2018-04-08 ENCOUNTER — Inpatient Hospital Stay (HOSPITAL_COMMUNITY): Payer: Medicare Other

## 2018-04-08 DIAGNOSIS — Z1612 Extended spectrum beta lactamase (ESBL) resistance: Secondary | ICD-10-CM

## 2018-04-08 DIAGNOSIS — A498 Other bacterial infections of unspecified site: Secondary | ICD-10-CM | POA: Diagnosis present

## 2018-04-08 LAB — CBC
HCT: 35.6 % — ABNORMAL LOW (ref 36.0–46.0)
Hemoglobin: 11.9 g/dL — ABNORMAL LOW (ref 12.0–15.0)
MCH: 31.2 pg (ref 26.0–34.0)
MCHC: 33.4 g/dL (ref 30.0–36.0)
MCV: 93.4 fL (ref 78.0–100.0)
PLATELETS: 145 10*3/uL — AB (ref 150–400)
RBC: 3.81 MIL/uL — AB (ref 3.87–5.11)
RDW: 13.3 % (ref 11.5–15.5)
WBC: 5.6 10*3/uL (ref 4.0–10.5)

## 2018-04-08 LAB — BASIC METABOLIC PANEL
Anion gap: 6 (ref 5–15)
BUN: 23 mg/dL (ref 8–23)
CHLORIDE: 110 mmol/L (ref 98–111)
CO2: 22 mmol/L (ref 22–32)
Calcium: 9 mg/dL (ref 8.9–10.3)
Creatinine, Ser: 1.52 mg/dL — ABNORMAL HIGH (ref 0.44–1.00)
GFR, EST AFRICAN AMERICAN: 36 mL/min — AB (ref >60)
GFR, EST NON AFRICAN AMERICAN: 31 mL/min — AB (ref >60)
Glucose, Bld: 116 mg/dL — ABNORMAL HIGH (ref 70–99)
POTASSIUM: 4 mmol/L (ref 3.5–5.1)
SODIUM: 138 mmol/L (ref 135–145)

## 2018-04-08 MED ORDER — SODIUM CHLORIDE 0.9 % IV SOLN
1.0000 g | Freq: Two times a day (BID) | INTRAVENOUS | Status: DC
  Administered 2018-04-08 – 2018-04-09 (×3): 1 g via INTRAVENOUS
  Filled 2018-04-08 (×5): qty 1

## 2018-04-08 MED ORDER — SODIUM CHLORIDE 0.9 % IV SOLN
2.0000 g | INTRAVENOUS | Status: DC
  Administered 2018-04-08: 2 g via INTRAVENOUS
  Filled 2018-04-08 (×2): qty 20

## 2018-04-08 MED ORDER — SODIUM CHLORIDE 0.9 % IV SOLN
2.0000 g | INTRAVENOUS | Status: DC
  Filled 2018-04-08 (×2): qty 20

## 2018-04-08 NOTE — Progress Notes (Signed)
Patient Demographics:    Becky Baird, is a 84 y.o. female, DOB - Jan 11, 1934, UXY:[id number]  Admit date - 04/07/2018   Admitting Physician Pratik Darleen Crocker, DO  Outpatient Primary MD for the patient is Kathyrn Drown, MD  LOS - 1   Chief Complaint  Patient presents with  . Altered Mental Status        Subjective:    Becky Baird today has  no emesis,  No chest pain,  Has Rigors, chills  Assessment  & Plan :    Principal Problem:   Bacteremia/Infection due to ESBL-producing Escherichia coli Active Problems:   Hypertension   Hyperlipidemia   History of stroke   Paroxysmal atrial fibrillation (HCC)   Acute metabolic encephalopathy   Acute pyelonephritis   AKI (acute kidney injury) ([place])   Hypokalemia   Hyponatremia   Pyelonephritis  Brief Summary:-  84 year old admitted with lethargy/altered mentation, admitted on 04/07/2018 found to have right-sided pyelonephritis, blood and urine cultures with ESBL E. Coli, started on meropenem on 04/08/2018  Plan:  1)AKI----acute kidney injury on CKD stage - III,      creatinine on admission= 1.78  ,   baseline creatinine =  1.2 to 1.3  , creatinine is now= 1.52, continue IV fluids, renally adjust medications, avoid nephrotoxic agents/dehydration/hypotension,  CT abd/Pelvis with Mild bilateral hydroureteronephrosis with perinephric edema, left greater than right, renal ultrasound without hydronephrosis or significant obstructive uropathy  2)ESBL E. coli  Bacteremia/pyelonephritis--on admission patient was on IV Rocephin from 04/07/18 to  04/08/2018, started on IV meropenem on 04/08/2018, follow clinical improvement, continues to have chills and Rigors  3)Dehydration/mild hyponatremia/mild hypokalemia--improved with hydration, okay to decrease IV fluids to 50 mL an hour  4)Lethargy/acute metabolic encephalopathy--due to ESBL E. coli bacteremia/pyelonephritis  compounded by dehydration and AKI  5)Acute Anemia--- baseline hemoglobin usually around 12, hemoglobin now around 11.5-11.9, no evidence of ongoing bleeding watch closely  6)PAFib--stable, continue Xarelto for anticoagulation, rate appears controlled without any AV nodal agents  7)Generalized Weakness/Debility--- PT evaluation, fall precautions  Disposition/Need for in-Hospital Stay- patient unable to be discharged at this time due to ESBL bacteremia requiring IV meropenem, patient continues to have chills and Rigors, renal function is improving but not back to baseline  Code Status : Full    Disposition Plan  : TBD  Consults  :  PT  DVT Prophylaxis  :  Xarelto  Lab Results  Component Value Date   PLT 145 (L) 04/08/2018    Inpatient Medications  Scheduled Meds: . gabapentin  300 mg Oral QHS  . Influenza vac split quadrivalent PF  0.5 mL Intramuscular Tomorrow-1000  . loratadine  10 mg Oral QHS  . multivitamin with minerals  1 tablet Oral q morning - 10a  . omega-3 acid ethyl esters  2 g Oral q morning - 10a  . pantoprazole  40 mg Oral Daily  . potassium chloride  10 mEq Oral BID  . potassium chloride  40 mEq Oral Once  . Rivaroxaban  15 mg Oral Q supper   Continuous Infusions: . sodium chloride 100 mL/hr at 04/08/18 1100  . meropenem (MERREM) IV 1 g (04/08/18 1301)   PRN Meds:.acetaminophen **OR** acetaminophen, ALPRAZolam, hydroxypropyl methylcellulose / hypromellose, ondansetron **OR** ondansetron (ZOFRAN) IV,  polyvinyl alcohol    Anti-infectives (From admission, onward)   Start     Dose/Rate Route Frequency Ordered Stop   04/08/18 1200  cefTRIAXone (ROCEPHIN) 2 g in sodium chloride 0.9 % 100 mL IVPB  Status:  Discontinued     2 g 200 mL/hr over 30 Minutes Intravenous Every 24 hours 04/08/18 0635 04/08/18 0655   04/08/18 1200  meropenem (MERREM) 1 g in sodium chloride 0.9 % 100 mL IVPB     1 g 200 mL/hr over 30 Minutes Intravenous Every 12 hours 04/08/18 1157       04/08/18 0700  cefTRIAXone (ROCEPHIN) 2 g in sodium chloride 0.9 % 100 mL IVPB  Status:  Discontinued     2 g 200 mL/hr over 30 Minutes Intravenous Every 24 hours 04/08/18 0655 04/08/18 1157   04/08/18 0400  cefTRIAXone (ROCEPHIN) 1 g in sodium chloride 0.9 % 100 mL IVPB  Status:  Discontinued     1 g 200 mL/hr over 30 Minutes Intravenous Every 24 hours 04/07/18 0620 04/07/18 1651   04/08/18 0400  cefTRIAXone (ROCEPHIN) 2 g in sodium chloride 0.9 % 100 mL IVPB  Status:  Discontinued     2 g 200 mL/hr over 30 Minutes Intravenous Every 24 hours 04/07/18 1651 04/08/18 0635   04/07/18 1700  cefTRIAXone (ROCEPHIN) 1 g in sodium chloride 0.9 % 100 mL IVPB     1 g 200 mL/hr over 30 Minutes Intravenous  Once 04/07/18 1648 04/07/18 1734   04/07/18 0415  cefTRIAXone (ROCEPHIN) 1 g in sodium chloride 0.9 % 100 mL IVPB     1 g 200 mL/hr over 30 Minutes Intravenous  Once 04/07/18 0402 04/07/18 0454        Objective:   Vitals:   04/07/18 1428 04/07/18 2141 04/08/18 0442 04/08/18 1447  BP: (!) 106/48 (!) 107/42 (!) 136/49 134/64  Pulse: 69 71 91 89  Resp: 18 18 15 20   Temp: 98.7 F (37.1 C) 98.6 F (37 C) 99.5 F (37.5 C) 99.5 F (37.5 C)  TempSrc: Oral Oral Oral Oral  SpO2: 98% 95% 93% 97%  Weight:      Height:        Wt Readings from Last 3 Encounters:  04/07/18 64.5 kg  02/16/18 65.3 kg  01/05/18 66.7 kg     Intake/Output Summary (Last 24 hours) at 04/08/2018 1743 Last data filed at 04/08/2018 1649 Gross per 24 hour  Intake 3181.15 ml  Output 6 ml  Net 3175.15 ml     Physical Exam Patient is examined daily including today on 04/08/18 , exams remain the same as of yesterday except that has changed   Gen:- Awake Alert, more awake, more interactive HEENT:- [place].AT, No sclera icterus Neck-Supple Neck,No JVD,.  Lungs-diminished in bases, no wheezing CV- S1, S2 normal, irregularly irregular Abd-  +ve B.Sounds, Abd Soft, right-sided CVA tenderness is improving     Extremity/Skin:- No  edema,   good pulses Psych-affect is appropriate, oriented x3 Neuro-generalized weakness without new focal deficits, no tremors   Data Review:   Micro Results Recent Results (from the past 240 hour(s))  Blood culture (routine x 2)     Status: Abnormal (Preliminary result)   Collection Time: 04/07/18  2:20 AM  Result Value Ref Range Status   Specimen Description   Final    BLOOD RIGHT ARM Performed at [facility], [address] [place], [place] [address]    Special Requests   Final    BOTTLES DRAWN AEROBIC  AND ANAEROBIC Blood Culture results may not be optimal due to an excessive volume of blood received in culture bottles Performed at [facility], [address], [place], [place] [address]    Culture  Setup Time   Final    GRAM NEGATIVE RODS IN BOTH AEROBIC AND ANAEROBIC BOTTLES Gram Stain Report Called to,Read Back By and Verified With: VIVENS @ 1425 ON [id number] BY HENDERSON L. CRITICAL RESULT CALLED TO, READ BACK BY AND VERIFIED WITH: L BIVENS,RN AT 1727 04/07/18 BY L BENFIELD    Culture (A)  Final    ESCHERICHIA COLI SUSCEPTIBILITIES TO FOLLOW Performed at [facility], [place] [address] [place], [place] [address]    Report Status PENDING  Incomplete  Blood Culture ID Panel (Reflexed)     Status: Abnormal   Collection Time: 04/07/18  2:20 AM  Result Value Ref Range Status   Enterococcus species NOT DETECTED NOT DETECTED Final   Listeria monocytogenes NOT DETECTED NOT DETECTED Final   Staphylococcus species NOT DETECTED NOT DETECTED Final   Staphylococcus aureus NOT DETECTED NOT DETECTED Final   Streptococcus species NOT DETECTED NOT DETECTED Final   Streptococcus agalactiae NOT DETECTED NOT DETECTED Final   Streptococcus pneumoniae NOT DETECTED NOT DETECTED Final   Streptococcus pyogenes NOT DETECTED NOT DETECTED Final   Acinetobacter baumannii NOT DETECTED NOT DETECTED Final   Enterobacteriaceae species DETECTED (A) NOT DETECTED Final     Comment: Enterobacteriaceae represent a large family of gram-negative bacteria, not a single organism. CRITICAL RESULT CALLED TO, READ BACK BY AND VERIFIED WITH: L BIVENS,RN AT 1727 04/07/18 BY L BENFIELD    Enterobacter cloacae complex NOT DETECTED NOT DETECTED Final   Escherichia coli DETECTED (A) NOT DETECTED Final    Comment: CRITICAL RESULT CALLED TO, READ BACK BY AND VERIFIED WITH: L BIVENS,RN AT 1727 04/07/18 BY L BENFIELD    Klebsiella oxytoca NOT DETECTED NOT DETECTED Final   Klebsiella pneumoniae NOT DETECTED NOT DETECTED Final   Proteus species NOT DETECTED NOT DETECTED Final   Serratia marcescens NOT DETECTED NOT DETECTED Final   Carbapenem resistance NOT DETECTED NOT DETECTED Final   Haemophilus influenzae NOT DETECTED NOT DETECTED Final   Neisseria meningitidis NOT DETECTED NOT DETECTED Final   Pseudomonas aeruginosa NOT DETECTED NOT DETECTED Final   Candida albicans NOT DETECTED NOT DETECTED Final   Candida glabrata NOT DETECTED NOT DETECTED Final   Candida krusei NOT DETECTED NOT DETECTED Final   Candida parapsilosis NOT DETECTED NOT DETECTED Final   Candida tropicalis NOT DETECTED NOT DETECTED Final    Comment: Performed at [facility], [place] [address] [place], [place] [address]  Blood culture (routine x 2)     Status: None (Preliminary result)   Collection Time: 04/07/18  2:25 AM  Result Value Ref Range Status   Specimen Description   Final    BLOOD LEFT WRIST Performed at [facility], [address], [place], [place] [address]    Special Requests   Final    BOTTLES DRAWN AEROBIC AND ANAEROBIC Blood Culture results may not be optimal due to an excessive volume of blood received in culture bottles Performed at [facility], [address] [place], [place] [address]    Culture  Setup Time   Final    GRAM NEGATIVE RODS AEROBIC BOTTLE ONLY Gram Stain Report Called to,Read Back By and Verified With: VIVENS @ 1702 ON [id number] BY HENDERSON L Organism ID to  follow Performed at [facility], [place]  [address], [place], [place] [address]    Culture PENDING  Incomplete   Report Status PENDING  Incomplete  Urine Culture     Status: Abnormal (Preliminary result)   Collection Time: 04/07/18  4:03 AM  Result Value Ref Range Status   Specimen Description   Final    URINE, CATHETERIZED Performed at [facility], [address] [place], [place] [address]    Special Requests   Final    NONE Performed at [facility], [address] [place], [place] [address]    Culture >=100,000 COLONIES/mL ESCHERICHIA COLI (A)  Final   Report Status PENDING  Incomplete    Radiology Reports Ct Abdomen Pelvis Wo Contrast  Result Date: 04/07/2018 CLINICAL DATA:  Lower abdominal pain and fever. EXAM: CT ABDOMEN AND PELVIS WITHOUT CONTRAST TECHNIQUE: Multidetector CT imaging of the abdomen and pelvis was performed following the standard protocol without IV contrast. COMPARISON:  CT 10/17/2007 FINDINGS: Lower chest: Mild hypoventilatory atelectasis. No pleural fluid. Coronary artery calcifications. Hepatobiliary: No focal liver abnormality is seen. No gallstones, gallbladder wall thickening, or biliary dilatation. Pancreas: No ductal dilatation or inflammation. Spleen: Calcified granuloma.  Normal in size. Adrenals/Urinary Tract: Mild left adrenal thickening without dominant nodule. Normal right adrenal gland. Mild left hydroureteronephrosis with moderate perinephric and periureteric stranding. No urolithiasis or cause of obstruction. Mild right hydroureteronephrosis with perinephric and periureteric edema. Simple cyst in the upper right kidney measures 6.6 cm. Urinary bladder is distended with mild wall thickening about the dome. No emphysematous changes are air in the collecting system. No evidence of perirenal fluid collection on noncontrast exam. Stomach/Bowel: Stomach is nondistended. Lack of enteric contrast limits bowel evaluation. No evidence of bowel wall thickening,  inflammatory change or obstruction. Colonic diverticulosis, prominent in the distal descending and sigmoid without diverticulitis. Normal appendix. Vascular/Lymphatic: Mild aorta bi-iliac atherosclerosis. Prominent retroperitoneal nodes are likely reactive. No bulky adenopathy. Reproductive: Uterus and bilateral adnexa are unremarkable. Other: No free air, free fluid, or intra-abdominal fluid collection. Musculoskeletal: Scoliosis and degenerative change throughout spine. Bones are under mineralized. Suspect remote left sacral insufficiency fracture. IMPRESSION: 1. Mild bilateral hydroureteronephrosis with perinephric edema, left greater than right. No obstructing stones. Findings suspicious for pyelonephritis. No evidence of perirenal abscess on noncontrast exam. 2. Colonic diverticulosis without diverticulitis. Aortic Atherosclerosis (ICD10-I70.0). Electronically Signed   By: Keith Rake M.D.   On: 04/07/2018 03:57   Dg Chest 2 View  Result Date: 04/07/2018 CLINICAL DATA:  Altered mental status. EXAM: CHEST - 2 VIEW COMPARISON:  Radiograph 05/12/2016 FINDINGS: Lower lung volumes from prior exam.The cardiomediastinal contours are normal. Mild scarring in the right midlung. Pulmonary vasculature is normal. No consolidation, pleural effusion, or pneumothorax. No acute osseous abnormalities are seen. IMPRESSION: No acute chest finding. Electronically Signed   By: Keith Rake M.D.   On: 04/07/2018 03:58   Ct Head Wo Contrast  Result Date: 04/07/2018 CLINICAL DATA:  Altered level of consciousness. EXAM: CT HEAD WITHOUT CONTRAST TECHNIQUE: Contiguous axial images were obtained from the base of the skull through the vertex without intravenous contrast. COMPARISON:  Head CT and brain MRI 05/12/2016 FINDINGS: Brain: No acute hemorrhage. Unchanged atrophy and chronic small vessel ischemia. Unchanged remote infarct in right MCA distribution, and chronic lacunar infarcts in the right caudate. No evidence of  acute ischemia. No hydrocephalus, the basilar cisterns are patent. No subdural collection. Vascular: Stent in the right MCA.  No hyperdense vessel. Skull: No fracture or focal lesion. Sinuses/Orbits: Paranasal sinuses and mastoid air cells are clear. The  visualized orbits are unremarkable. Other: None. IMPRESSION: 1.  No acute intracranial abnormality. 2. Unchanged atrophy, chronic small vessel ischemia, prior right MCA infarcts and stent. Electronically Signed   By: Keith Rake M.D.   On: 04/07/2018 03:47   US Renal  Result Date: 04/08/2018 CLINICAL DATA:  Lower abdominal pain, fever of unknown origin, UTI, renal cysts on CT EXAM: RENAL / URINARY TRACT ULTRASOUND COMPLETE COMPARISON:  CT abdomen/pelvis dated 04/07/2018 FINDINGS: Right Kidney: Length: 10.2 cm. 5.3 x 7.1 x 5.7 cm upper pole simple cyst. 1.4 x 1.3 x 1.1 cm interpolar simple cyst. No hydronephrosis. Left Kidney: Length: 10.4 cm. 1.1 x 0.7 x 0.9 cm interpolar simple cyst. No hydronephrosis. Bladder: Bladder is within normal limits. IMPRESSION: Bilateral renal cysts, including a dominant 7.1 cm right upper pole simple cyst, benign. No hydronephrosis. Electronically Signed   By: Julian Hy M.D.   On: 04/08/2018 08:54     CBC Recent Labs  Lab 04/07/18 0220 04/08/18 0551  WBC 11.4* 5.6  HGB 11.5* 11.9*  HCT 34.4* 35.6*  PLT 146* 145*  MCV 93.0 93.4  MCH 31.1 31.2  MCHC 33.4 33.4  RDW 12.9 13.3  LYMPHSABS 0.7  --   MONOABS 1.0  --   EOSABS 0.0  --   BASOSABS 0.0  --     Chemistries  Recent Labs  Lab 04/07/18 0220 04/08/18 0551  NA 132* 138  K 3.3* 4.0  CL 100 110  CO2 23 22  GLUCOSE 137* 116*  BUN 27* 23  CREATININE 1.78* 1.52*  CALCIUM 9.1 9.0  MG 1.8  --   AST 32  --   ALT 20  --   ALKPHOS 54  --   BILITOT 1.1  --    ------------------------------------------------------------------------------------------------------------------ No results for input(s): CHOL, HDL, LDLCALC, TRIG, CHOLHDL, LDLDIRECT  in the last 72 hours.  Lab Results  Component Value Date   HGBA1C 5.6 11/23/2017   ------------------------------------------------------------------------------------------------------------------ No results for input(s): TSH, T4TOTAL, T3FREE, THYROIDAB in the last 72 hours.  Invalid input(s): FREET3 ------------------------------------------------------------------------------------------------------------------ No results for input(s): VITAMINB12, FOLATE, FERRITIN, TIBC, IRON, RETICCTPCT in the last 72 hours.  Coagulation profile No results for input(s): INR, PROTIME in the last 168 hours.  No results for input(s): DDIMER in the last 72 hours.  Cardiac Enzymes No results for input(s): CKMB, TROPONINI, MYOGLOBIN in the last 168 hours.  Invalid input(s): CK ------------------------------------------------------------------------------------------------------------------    Component Value Date/Time   BNP 143.0 (H) 11/28/2017 1950     Roxan Hockey M.D on 04/08/2018 at 5:43 PM  Pager---[phone] Go to [email or web address] - password [email or web address] for contact info  [facility] - Office  [phone]

## 2018-04-08 NOTE — Progress Notes (Signed)
Pharmacy Antibiotic Note  Becky Baird is a 84 y.o. female admitted on 04/07/2018 with bacteremia.  Pharmacy has been consulted for Merrem dosing.  Plan: D/C ceftriaxone Merrem 1gm IV q12h F/U cxs and clinical progress Monitor V/S and labs  Height: 5\' 7"  (170.2 cm) Weight: 142 lb 3.2 oz (64.5 kg) IBW/kg (Calculated) : 61.6  Temp (24hrs), Avg:98.9 F (37.2 C), Min:98.6 F (37 C), Max:99.5 F (37.5 C)  Recent Labs  Lab 04/07/18 0220 04/07/18 0229 04/07/18 0409 04/08/18 0551  WBC 11.4*  --   --  5.6  CREATININE 1.78*  --   --  1.52*  LATICACIDVEN  --  0.48* 0.83  --     Estimated Creatinine Clearance: 27.7 mL/min (A) (by C-G formula based on SCr of 1.52 mg/dL (H)).    Allergies  Allergen Reactions  . Amlodipine     Pedal edema  . Niaspan [Niacin Er] Rash    Antimicrobials this admission: Merrem 9/15 >>  Ceftriaxone 9/14 >> 9/15  Dose adjustments this admission: n/a  Microbiology results: 9/14 BCx: GNR in both bottles. BCID = E.COLI--> pt has h/o E.Coli ESBL Escherichia coli  DETECTEDAbnormal    9/14 UCx;  GNR >100,000 CFU/ml   Thank you for allowing pharmacy to be a part of this patient's care.  Isac Sarna, BS Pharm D, BCPS Clinical Pharmacist Pager [phone] 04/08/2018 1:00 PM

## 2018-04-09 ENCOUNTER — Ambulatory Visit: Payer: Medicare Other | Admitting: Family Medicine

## 2018-04-09 LAB — CULTURE, BLOOD (ROUTINE X 2)

## 2018-04-09 LAB — URINE CULTURE: Culture: >=100000 — AB

## 2018-04-09 MED ORDER — SODIUM CHLORIDE 0.9 % IV SOLN
2.0000 g | INTRAVENOUS | Status: DC
  Administered 2018-04-09 – 2018-04-10 (×2): 2 g via INTRAVENOUS
  Filled 2018-04-09: qty 20
  Filled 2018-04-09 (×2): qty 2

## 2018-04-09 NOTE — Care Management Important Message (Signed)
Important Message  Patient Details  Name: Becky Baird MRN: [id number] Date of Birth: November 02, 1933   Medicare Important Message Given:  Yes    Shelda Altes 04/09/2018, 10:28 AM

## 2018-04-09 NOTE — Clinical Social Work Note (Signed)
Clinical Social Work Assessment  Patient Details  Name: Becky Baird MRN: [id number] Date of Birth: 11-12-1933  Date of referral:  04/09/18               Reason for consult:  Facility Placement                Permission sought to share information with:    Permission granted to share information::     Name::        Agency::     Relationship::     Contact Information:  daughters via telephone   Housing/Transportation Living arrangements for the past 2 months:  Single Family Home Source of Information:  Patient, Adult Children Patient Interpreter Needed:  None Criminal Activity/Legal Involvement Pertinent to Current Situation/Hospitalization:  No - Comment as needed Significant Relationships:  None Lives with:  Adult Children Do you feel safe going back to the place where you live?  Yes Need for family participation in patient care:  Yes (Comment)  Care giving concerns:  None identified at baseline.   Social Worker assessment / plan:  Patient's youngest daughter lives with her. At baseline, patient ambulates with a cane, drives and is independent in ADLS.  LCSW discussed 4-5 days of IV antibiotic options.  Patient stated that she wanted to go home.  Daughters were agreeable to administrating antibiotics.  It was later learned that patient will go home on PO antibiotics.  Daughters indicated that they would like for patient to be considered at [facility] for 4-5 days of rehab despite [facility] recommendation by PT.   LCSW sent referral to [facility] for review.   Employment status:  Retired Forensic scientist:  Medicare PT Recommendations:  Home with [place] / Referral to community resources:     Patient/Family's Response to care:  Family would like for patient to be considered at [facility] despite [facility] recommendation and patient going home on PO antibiotics.   Patient/Family's Understanding of and Emotional Response to Diagnosis, Current Treatment, and Prognosis:  Patient/family  understand patient's diagnosis, treatment and prognosis and  Family would like for patient to be considered at [facility] despite [facility] recommendation and patient going home on PO antibiotics.   Emotional Assessment Appearance:  Appears stated age Attitude/Demeanor/Rapport:    Affect (typically observed):  Accepting Orientation:  Oriented to Self, Oriented to Place, Oriented to  Time, Oriented to Situation Alcohol / Substance use:  Not Applicable Psych involvement (Current and /or in the community):  No (Comment)  Discharge Needs  Concerns to be addressed:  Discharge Planning Concerns Readmission within the last 30 days:  No Current discharge risk:  None Barriers to Discharge:  No Barriers Identified   Ihor Gully, LCSW 04/09/2018, 3:51 PM

## 2018-04-09 NOTE — Evaluation (Signed)
Physical Therapy Evaluation Patient Details Name: Becky Baird MRN: [id number] DOB: Nov 24, 1933 Today's Date: 04/09/2018   History of Present Illness  Becky Baird is a 84 y.o. female with medical history significant for dyslipidemia, hypertension, prior CVA with mild cognitive impairment, anxiety, CKD stage III and paroxysmal atrial fibrillation on Xarelto, who was brought to the ED with fever and shaking chills with temperature 104 Fahrenheit at home.  Her daughters at home are concerned about the fact that she was quite somnolent.  They had difficulty arousing her at home and called EMS.  Upon arrival, EMS noted the patient was quite arousable and was able to easily answer questions.  She complained of some intermittent lower abdominal pain, but did not have any dysuria or hematuria, nausea, vomiting, or diarrhea.  She denies any chest pain, or shortness of breath.  She continues to take her medications as prescribed, but has had some questionable oral intake with diminished appetite.      Clinical Impression  Patient functioning near baseline for functional mobility and gait other than demonstrating labored movement for sitting up, transfers, and ambulation in hallway without loss of balance, able to transfer to [facility] then stood in front of sink without AD to wash hands before going back to bed.  Patient will benefit from continued physical therapy in hospital and recommended venue below to increase strength, balance, endurance for safe ADLs and gait.    Follow Up Recommendations Home health PT;Supervision for mobility/OOB;Supervision - Intermittent    Equipment Recommendations  None recommended by PT    Recommendations for Other Services       Precautions / Restrictions Precautions Precautions: Fall Restrictions Weight Bearing Restrictions: No      Mobility  Bed Mobility Overal bed mobility: Needs Assistance Bed Mobility: Supine to Sit     Supine to sit: Supervision      General bed mobility comments: has to use bed rail to pull self to sitting  Transfers Overall transfer level: Needs assistance Equipment used: Rolling walker (2 wheeled) Transfers: Sit to/from [facility] Sit to Stand: Min guard Stand pivot transfers: Min guard       General transfer comment: slightly labored movement  Ambulation/Gait Ambulation/Gait assistance: Min guard Gait Distance (Feet): 65 Feet Assistive device: Rolling walker (2 wheeled) Gait Pattern/deviations: Decreased step length - right;Decreased stride length Gait velocity: decreased   General Gait Details: slow slightly labored cadence without loss of balance, limited due to c/o mild fatigue  Stairs            Wheelchair Mobility    Modified Rankin (Stroke Patients Only)       Balance Overall balance assessment: Needs assistance Sitting-balance support: Feet supported;No upper extremity supported Sitting balance-Leahy Scale: Good     Standing balance support: During functional activity;Bilateral upper extremity supported Standing balance-Leahy Scale: Fair                               Pertinent Vitals/Pain Pain Assessment: No/denies pain    Home Living Family/patient expects to be discharged to:: Private residence Living Arrangements: Alone Available Help at Discharge: Family Type of Home: House Home Access: Stairs to enter Entrance Stairs-Rails: Right Entrance Stairs-Number of Steps: 1 Home Layout: One level Home Equipment: Justice - 4 wheels;Cane - single point;Bedside commode      Prior Function Level of Independence: Independent with assistive device(s)         Comments: household  and short distanced community ambulator with Rollator, drives     Hand Dominance        Extremity/Trunk Assessment   Upper Extremity Assessment Upper Extremity Assessment: Generalized weakness    Lower Extremity Assessment Lower Extremity Assessment: Generalized  weakness    Cervical / Trunk Assessment Cervical / Trunk Assessment: Normal  Communication   Communication: No difficulties  Cognition Arousal/Alertness: Awake/alert Behavior During Therapy: WFL for tasks assessed/performed Overall Cognitive Status: Within Functional Limits for tasks assessed                                        General Comments      Exercises     Assessment/Plan    PT Assessment Patient needs continued PT services  PT Problem List Decreased strength;Decreased activity tolerance;Decreased balance;Decreased mobility       PT Treatment Interventions Gait training;Stair training;Functional mobility training;Therapeutic activities;Therapeutic exercise;Patient/family education    PT Goals (Current goals can be found in the Care Plan section)  Acute Rehab PT Goals Patient Stated Goal: return home with family to assist PT Goal Formulation: With patient/family Time For Goal Achievement: 04/16/18 Potential to Achieve Goals: Good    Frequency Min 3X/week   Barriers to discharge        Co-evaluation               AM-PAC PT "6 Clicks" Daily Activity  Outcome Measure Difficulty turning over in bed (including adjusting bedclothes, sheets and blankets)?: None Difficulty moving from lying on back to sitting on the side of the bed? : None Difficulty sitting down on and standing up from a chair with arms (e.g., wheelchair, bedside commode, etc,.)?: None Help needed moving to and from a bed to chair (including a wheelchair)?: A Little Help needed walking in hospital room?: A Little Help needed climbing 3-5 steps with a railing? : A Lot 6 Click Score: 20    End of Session   Activity Tolerance: Patient tolerated treatment well;Patient limited by fatigue Patient left: in bed;with call bell/phone within reach Nurse Communication: Mobility status PT Visit Diagnosis: Unsteadiness on feet (R26.81);Other abnormalities of gait and mobility  (R26.89);Muscle weakness (generalized) (M62.81)    Time: 2446-2863 PT Time Calculation (min) (ACUTE ONLY): 28 min   Charges:   PT Evaluation $PT Eval Moderate Complexity: 1 Mod PT Treatments $Therapeutic Activity: 23-37 mins        2:16 PM, 04/09/18 Lonell Grandchild, MPT Physical Therapist with [facility] [phone] office [phone] mobile phone

## 2018-04-09 NOTE — Plan of Care (Signed)
  Problem: Acute Rehab PT Goals(only PT should resolve) Goal: Pt Will Go Supine/Side To Sit Outcome: Progressing Flowsheets (Taken 04/09/2018 1418) Pt will go Supine/Side to Sit: with modified independence Goal: Patient Will Transfer Sit To/From Stand Outcome: Progressing Flowsheets (Taken 04/09/2018 1418) Patient will transfer sit to/from stand: with supervision Goal: Pt Will Transfer Bed To Chair/Chair To Bed Outcome: Progressing Flowsheets (Taken 04/09/2018 1418) Pt will Transfer Bed to Chair/Chair to Bed: with supervision Goal: Pt Will Ambulate Outcome: Progressing Flowsheets (Taken 04/09/2018 1418) Pt will Ambulate: 75 feet; with rolling walker; with supervision   2:18 PM, 04/09/18 Lonell Grandchild, MPT Physical Therapist with [facility] [phone] office [phone] mobile phone

## 2018-04-09 NOTE — Care Management Note (Addendum)
Case Management Note  Patient Details  Name: Becky Baird MRN: [id number] Date of Birth: 1934/03/02  Subjective/Objective:  Bacteremia. From home, one daughter lives with her. Has RW and cane. Has PCP- Dr Wolfgang Phoenix. Will need IV antibiotics. Patient would like to go home if daughter will assist with administering IV antibiotics. She plans to talk with family today and CM will f/u tomorrow. Also recommended for [facility] PT if goes home.                   Action/Plan: CM will follow, anticipate DC with [facility] if family agrees.   ADDENDUM: 8592: Patient reports she would like SNF for IV antibiotics. CSW notified.  Expected Discharge Date:  04/09/18               Expected Discharge Plan:  [place]  In-House Referral:     Discharge planning Services  CM Consult  Post Acute Care Choice:    Choice offered to:     DME Arranged:    DME Agency:     HH Arranged:    HH Agency:     Status of Service:  In process, will continue to follow  If discussed at Long Length of Stay Meetings, dates discussed:    Additional Comments:  Nerissa Constantin, Chauncey Reading, RN 04/09/2018, 12:15 PM

## 2018-04-09 NOTE — Progress Notes (Signed)
Patient Demographics:    Becky Baird, is a 84 y.o. female, DOB - 03-Dec-1933, SEG:[id number]  Admit date - 04/07/2018   Admitting Physician Pratik Darleen Crocker, DO  Outpatient Primary MD for the patient is Kathyrn Drown, MD  LOS - 2   Chief Complaint  Patient presents with  . Altered Mental Status        Subjective:    Becky Baird today has  no emesis,  No chest pain, daughters are visiting, no diarrhea, complains of fatigue and generalized weakness  Assessment  & Plan :    Principal Problem:   Bacteremia/Infection due to ESBL-producing Escherichia coli Active Problems:   Hypertension   Hyperlipidemia   History of stroke   Paroxysmal atrial fibrillation (HCC)   Acute metabolic encephalopathy   Acute pyelonephritis   AKI (acute kidney injury) ([place])   Hypokalemia   Hyponatremia   Pyelonephritis  Brief Summary:-  84 year old admitted with lethargy/altered mentation, admitted on 04/07/2018 found to have right-sided pyelonephritis, blood and urine cultures with   E. Coli, started on meropenem on 04/08/2018, ESBL not confirmed this time patient had ESBL in June 2019, switching back to IV Rocephin as of 04/09/2018  Plan:  1)AKI----acute kidney injury on CKD stage - III,      creatinine on admission= 1.78  ,   baseline creatinine =  1.2 to 1.3  , creatinine is now= 1.52, continue IV fluids, renally adjust medications, avoid nephrotoxic agents/dehydration/hypotension,  CT abd/Pelvis with Mild bilateral hydroureteronephrosis with perinephric edema, left greater than right, renal ultrasound without hydronephrosis or significant obstructive uropathy  2)E. coli  Bacteremia/pyelonephritis--on admission patient was on IV Rocephin from 04/07/18 to  04/08/2018,  started on meropenem on 04/08/2018, ESBL not confirmed this time patient had ESBL in June 2019, switching back to IV Rocephin as of  04/09/2018  3)Dehydration/mild Hyponatremia/mild Hypokalemia--improved with hydration, decrease  IV fluids to 20 mL an hour  4)Lethargy/acute metabolic encephalopathy--overall much improved, patient is talkative and interactive at this time, on admission she was lethargic and mostly nonresponsive verbally , due to E. coli bacteremia/pyelonephritis compounded by dehydration and AKI  5)Acute Anemia--- baseline hemoglobin usually around 12, hemoglobin now around 11.5-11.9, no evidence of ongoing bleeding watch closely  6)PAFib--stable, continue Xarelto for anticoagulation, rate appears controlled without any AV nodal agents  7)Generalized Weakness/Debility--- PT evaluation, fall precautions, recommend skilled nursing facility rehab,  Disposition/Need for in-Hospital Stay- patient unable to be discharged at this time due  renal function is improving but not back to baseline, patient is IV Rocephin for E. coli bacteremia, awaiting insurance approval to go to skilled nursing facility for rehab  Code Status : Full   Disposition Plan  : TBD  Consults  :  PT  DVT Prophylaxis  :  Xarelto  Lab Results  Component Value Date   PLT 145 (L) 04/08/2018    Inpatient Medications  Scheduled Meds: . gabapentin  300 mg Oral QHS  . Influenza vac split quadrivalent PF  0.5 mL Intramuscular Tomorrow-1000  . loratadine  10 mg Oral QHS  . multivitamin with minerals  1 tablet Oral q morning - 10a  . omega-3 acid ethyl esters  2 g Oral q morning - 10a  . pantoprazole  40 mg  Oral Daily  . potassium chloride  10 mEq Oral BID  . potassium chloride  40 mEq Oral Once  . Rivaroxaban  15 mg Oral Q supper   Continuous Infusions: . sodium chloride 50 mL/hr at 04/09/18 0335  . meropenem (MERREM) IV Stopped (04/08/18 2232)   PRN Meds:.acetaminophen **OR** acetaminophen, ALPRAZolam, hydroxypropyl methylcellulose / hypromellose, ondansetron **OR** ondansetron (ZOFRAN) IV, polyvinyl alcohol    Anti-infectives  (From admission, onward)   Start     Dose/Rate Route Frequency Ordered Stop   04/08/18 1200  cefTRIAXone (ROCEPHIN) 2 g in sodium chloride 0.9 % 100 mL IVPB  Status:  Discontinued     2 g 200 mL/hr over 30 Minutes Intravenous Every 24 hours 04/08/18 0635 04/08/18 0655   04/08/18 1200  meropenem (MERREM) 1 g in sodium chloride 0.9 % 100 mL IVPB     1 g 200 mL/hr over 30 Minutes Intravenous Every 12 hours 04/08/18 1157     04/08/18 0700  cefTRIAXone (ROCEPHIN) 2 g in sodium chloride 0.9 % 100 mL IVPB  Status:  Discontinued     2 g 200 mL/hr over 30 Minutes Intravenous Every 24 hours 04/08/18 0655 04/08/18 1157   04/08/18 0400  cefTRIAXone (ROCEPHIN) 1 g in sodium chloride 0.9 % 100 mL IVPB  Status:  Discontinued     1 g 200 mL/hr over 30 Minutes Intravenous Every 24 hours 04/07/18 0620 04/07/18 1651   04/08/18 0400  cefTRIAXone (ROCEPHIN) 2 g in sodium chloride 0.9 % 100 mL IVPB  Status:  Discontinued     2 g 200 mL/hr over 30 Minutes Intravenous Every 24 hours 04/07/18 1651 04/08/18 0635   04/07/18 1700  cefTRIAXone (ROCEPHIN) 1 g in sodium chloride 0.9 % 100 mL IVPB     1 g 200 mL/hr over 30 Minutes Intravenous  Once 04/07/18 1648 04/07/18 1734   04/07/18 0415  cefTRIAXone (ROCEPHIN) 1 g in sodium chloride 0.9 % 100 mL IVPB     1 g 200 mL/hr over 30 Minutes Intravenous  Once 04/07/18 0402 04/07/18 0454        Objective:   Vitals:   04/07/18 2141 04/08/18 0442 04/08/18 1447 04/09/18 0601  BP: (!) 107/42 (!) 136/49 134/64 (!) 159/72  Pulse: 71 91 89 88  Resp: 18 15 20 18   Temp: 98.6 F (37 C) 99.5 F (37.5 C) 99.5 F (37.5 C) 99.7 F (37.6 C)  TempSrc: Oral Oral Oral Oral  SpO2: 95% 93% 97% 96%  Weight:      Height:        Wt Readings from Last 3 Encounters:  04/07/18 64.5 kg  02/16/18 65.3 kg  01/05/18 66.7 kg     Intake/Output Summary (Last 24 hours) at 04/09/2018 0742 Last data filed at 04/09/2018 0335 Gross per 24 hour  Intake 2873.33 ml  Output 3 ml  Net  2870.33 ml     Physical Exam Patient is examined daily including today on 04/09/18 , exams remain the same as of yesterday except that has changed   Gen:- Awake Alert, more awake, more interactive HEENT:- [place].AT, No sclera icterus Neck-Supple Neck,No JVD,.  Lungs-diminished in bases, no wheezing CV- S1, S2 normal, irregularly irregular Abd-  +ve B.Sounds, Abd Soft, right-sided CVA tenderness is improving    Extremity/Skin:- No  edema,   good pulses Psych-affect is appropriate, oriented x3 Neuro-generalized weakness without new focal deficits, no tremors   Data Review:   Micro Results Recent Results (from the past 240 hour(s))  Blood  culture (routine x 2)     Status: Abnormal (Preliminary result)   Collection Time: 04/07/18  2:20 AM  Result Value Ref Range Status   Specimen Description   Final    BLOOD RIGHT ARM Performed at [facility], [address], [place], [place] [address]    Special Requests   Final    BOTTLES DRAWN AEROBIC AND ANAEROBIC Blood Culture results may not be optimal due to an excessive volume of blood received in culture bottles Performed at [facility], [address], [place], [place] [address]    Culture  Setup Time   Final    GRAM NEGATIVE RODS IN BOTH AEROBIC AND ANAEROBIC BOTTLES Gram Stain Report Called to,Read Back By and Verified With: VIVENS @ 1425 ON [id number] BY HENDERSON L. CRITICAL RESULT CALLED TO, READ BACK BY AND VERIFIED WITH: L BIVENS,RN AT 1727 04/07/18 BY L BENFIELD    Culture (A)  Final    ESCHERICHIA COLI SUSCEPTIBILITIES TO FOLLOW Performed at [facility], [place] [address] [place], [place] [address]    Report Status PENDING  Incomplete  Blood Culture ID Panel (Reflexed)     Status: Abnormal   Collection Time: 04/07/18  2:20 AM  Result Value Ref Range Status   Enterococcus species NOT DETECTED NOT DETECTED Final   Listeria monocytogenes NOT DETECTED NOT DETECTED Final   Staphylococcus species NOT DETECTED NOT DETECTED Final    Staphylococcus aureus NOT DETECTED NOT DETECTED Final   Streptococcus species NOT DETECTED NOT DETECTED Final   Streptococcus agalactiae NOT DETECTED NOT DETECTED Final   Streptococcus pneumoniae NOT DETECTED NOT DETECTED Final   Streptococcus pyogenes NOT DETECTED NOT DETECTED Final   Acinetobacter baumannii NOT DETECTED NOT DETECTED Final   Enterobacteriaceae species DETECTED (A) NOT DETECTED Final    Comment: Enterobacteriaceae represent a large family of gram-negative bacteria, not a single organism. CRITICAL RESULT CALLED TO, READ BACK BY AND VERIFIED WITH: L BIVENS,RN AT 1727 04/07/18 BY L BENFIELD    Enterobacter cloacae complex NOT DETECTED NOT DETECTED Final   Escherichia coli DETECTED (A) NOT DETECTED Final    Comment: CRITICAL RESULT CALLED TO, READ BACK BY AND VERIFIED WITH: L BIVENS,RN AT 1727 04/07/18 BY L BENFIELD    Klebsiella oxytoca NOT DETECTED NOT DETECTED Final   Klebsiella pneumoniae NOT DETECTED NOT DETECTED Final   Proteus species NOT DETECTED NOT DETECTED Final   Serratia marcescens NOT DETECTED NOT DETECTED Final   Carbapenem resistance NOT DETECTED NOT DETECTED Final   Haemophilus influenzae NOT DETECTED NOT DETECTED Final   Neisseria meningitidis NOT DETECTED NOT DETECTED Final   Pseudomonas aeruginosa NOT DETECTED NOT DETECTED Final   Candida albicans NOT DETECTED NOT DETECTED Final   Candida glabrata NOT DETECTED NOT DETECTED Final   Candida krusei NOT DETECTED NOT DETECTED Final   Candida parapsilosis NOT DETECTED NOT DETECTED Final   Candida tropicalis NOT DETECTED NOT DETECTED Final    Comment: Performed at [facility], [place] [address] [place], [place] [address]  Blood culture (routine x 2)     Status: None (Preliminary result)   Collection Time: 04/07/18  2:25 AM  Result Value Ref Range Status   Specimen Description   Final    BLOOD LEFT WRIST Performed at [facility], [address], [place], [place] [address]    Special Requests   Final     BOTTLES DRAWN AEROBIC AND ANAEROBIC Blood Culture results may not be optimal due to an excessive volume of blood  received in culture bottles Performed at [facility], [address], [place], [place] [address]    Culture  Setup Time   Final    GRAM NEGATIVE RODS AEROBIC BOTTLE ONLY Gram Stain Report Called to,Read Back By and Verified With: VIVENS @ 1702 ON [id number] BY HENDERSON L Organism ID to follow Performed at [facility], [place] [address], [place], [place] [address]    Culture PENDING  Incomplete   Report Status PENDING  Incomplete  Urine Culture     Status: Abnormal (Preliminary result)   Collection Time: 04/07/18  4:03 AM  Result Value Ref Range Status   Specimen Description   Final    URINE, CATHETERIZED Performed at [facility], [address] [place] [address] [place], [place] [address]    Special Requests   Final    NONE Performed at [facility], [address] [place], [place] [address]    Culture >=100,000 COLONIES/mL ESCHERICHIA COLI (A)  Final   Report Status PENDING  Incomplete    Radiology Reports Ct Abdomen Pelvis Wo Contrast  Result Date: 04/07/2018 CLINICAL DATA:  Lower abdominal pain and fever. EXAM: CT ABDOMEN AND PELVIS WITHOUT CONTRAST TECHNIQUE: Multidetector CT imaging of the abdomen and pelvis was performed following the standard protocol without IV contrast. COMPARISON:  CT 10/17/2007 FINDINGS: Lower chest: Mild hypoventilatory atelectasis. No pleural fluid. Coronary artery calcifications. Hepatobiliary: No focal liver abnormality is seen. No gallstones, gallbladder wall thickening, or biliary dilatation. Pancreas: No ductal dilatation or inflammation. Spleen: Calcified granuloma.  Normal in size. Adrenals/Urinary Tract: Mild left adrenal thickening without dominant nodule. Normal right adrenal gland. Mild left hydroureteronephrosis with moderate perinephric and periureteric stranding. No urolithiasis or cause of obstruction. Mild right hydroureteronephrosis with  perinephric and periureteric edema. Simple cyst in the upper right kidney measures 6.6 cm. Urinary bladder is distended with mild wall thickening about the dome. No emphysematous changes are air in the collecting system. No evidence of perirenal fluid collection on noncontrast exam. Stomach/Bowel: Stomach is nondistended. Lack of enteric contrast limits bowel evaluation. No evidence of bowel wall thickening, inflammatory change or obstruction. Colonic diverticulosis, prominent in the distal descending and sigmoid without diverticulitis. Normal appendix. Vascular/Lymphatic: Mild aorta bi-iliac atherosclerosis. Prominent retroperitoneal nodes are likely reactive. No bulky adenopathy. Reproductive: Uterus and bilateral adnexa are unremarkable. Other: No free air, free fluid, or intra-abdominal fluid collection. Musculoskeletal: Scoliosis and degenerative change throughout spine. Bones are under mineralized. Suspect remote left sacral insufficiency fracture. IMPRESSION: 1. Mild bilateral hydroureteronephrosis with perinephric edema, left greater than right. No obstructing stones. Findings suspicious for pyelonephritis. No evidence of perirenal abscess on noncontrast exam. 2. Colonic diverticulosis without diverticulitis. Aortic Atherosclerosis (ICD10-I70.0). Electronically Signed   By: Keith Rake M.D.   On: 04/07/2018 03:57   Dg Chest 2 View  Result Date: 04/07/2018 CLINICAL DATA:  Altered mental status. EXAM: CHEST - 2 VIEW COMPARISON:  Radiograph 05/12/2016 FINDINGS: Lower lung volumes from prior exam.The cardiomediastinal contours are normal. Mild scarring in the right midlung. Pulmonary vasculature is normal. No consolidation, pleural effusion, or pneumothorax. No acute osseous abnormalities are seen. IMPRESSION: No acute chest finding. Electronically Signed   By: Keith Rake M.D.   On: 04/07/2018 03:58   Ct Head Wo Contrast  Result Date: 04/07/2018 CLINICAL DATA:  Altered level of consciousness.  EXAM: CT HEAD WITHOUT CONTRAST TECHNIQUE: Contiguous axial images were obtained from the base of the skull through the vertex without intravenous contrast. COMPARISON:  Head CT and brain MRI 05/12/2016 FINDINGS: Brain: No acute  hemorrhage. Unchanged atrophy and chronic small vessel ischemia. Unchanged remote infarct in right MCA distribution, and chronic lacunar infarcts in the right caudate. No evidence of acute ischemia. No hydrocephalus, the basilar cisterns are patent. No subdural collection. Vascular: Stent in the right MCA.  No hyperdense vessel. Skull: No fracture or focal lesion. Sinuses/Orbits: Paranasal sinuses and mastoid air cells are clear. The visualized orbits are unremarkable. Other: None. IMPRESSION: 1.  No acute intracranial abnormality. 2. Unchanged atrophy, chronic small vessel ischemia, prior right MCA infarcts and stent. Electronically Signed   By: Keith Rake M.D.   On: 04/07/2018 03:47   US Renal  Result Date: 04/08/2018 CLINICAL DATA:  Lower abdominal pain, fever of unknown origin, UTI, renal cysts on CT EXAM: RENAL / URINARY TRACT ULTRASOUND COMPLETE COMPARISON:  CT abdomen/pelvis dated 04/07/2018 FINDINGS: Right Kidney: Length: 10.2 cm. 5.3 x 7.1 x 5.7 cm upper pole simple cyst. 1.4 x 1.3 x 1.1 cm interpolar simple cyst. No hydronephrosis. Left Kidney: Length: 10.4 cm. 1.1 x 0.7 x 0.9 cm interpolar simple cyst. No hydronephrosis. Bladder: Bladder is within normal limits. IMPRESSION: Bilateral renal cysts, including a dominant 7.1 cm right upper pole simple cyst, benign. No hydronephrosis. Electronically Signed   By: Julian Hy M.D.   On: 04/08/2018 08:54     CBC Recent Labs  Lab 04/07/18 0220 04/08/18 0551  WBC 11.4* 5.6  HGB 11.5* 11.9*  HCT 34.4* 35.6*  PLT 146* 145*  MCV 93.0 93.4  MCH 31.1 31.2  MCHC 33.4 33.4  RDW 12.9 13.3  LYMPHSABS 0.7  --   MONOABS 1.0  --   EOSABS 0.0  --   BASOSABS 0.0  --     Chemistries  Recent Labs  Lab 04/07/18 0220  04/08/18 0551  NA 132* 138  K 3.3* 4.0  CL 100 110  CO2 23 22  GLUCOSE 137* 116*  BUN 27* 23  CREATININE 1.78* 1.52*  CALCIUM 9.1 9.0  MG 1.8  --   AST 32  --   ALT 20  --   ALKPHOS 54  --   BILITOT 1.1  --    ------------------------------------------------------------------------------------------------------------------ No results for input(s): CHOL, HDL, LDLCALC, TRIG, CHOLHDL, LDLDIRECT in the last 72 hours.  Lab Results  Component Value Date   HGBA1C 5.6 11/23/2017   ------------------------------------------------------------------------------------------------------------------ No results for input(s): TSH, T4TOTAL, T3FREE, THYROIDAB in the last 72 hours.  Invalid input(s): FREET3 ------------------------------------------------------------------------------------------------------------------ No results for input(s): VITAMINB12, FOLATE, FERRITIN, TIBC, IRON, RETICCTPCT in the last 72 hours.  Coagulation profile No results for input(s): INR, PROTIME in the last 168 hours.  No results for input(s): DDIMER in the last 72 hours.  Cardiac Enzymes No results for input(s): CKMB, TROPONINI, MYOGLOBIN in the last 168 hours.  Invalid input(s): CK ------------------------------------------------------------------------------------------------------------------    Component Value Date/Time   BNP 143.0 (H) 11/28/2017 7680     Roxan Hockey M.D on 04/09/2018 at 7:42 AM  Pager---[phone] Go to [email or web address] - password [email or web address] for contact info  [facility] - Office  [phone]

## 2018-04-10 LAB — BASIC METABOLIC PANEL
Anion gap: 8 (ref 5–15)
BUN: 16 mg/dL (ref 8–23)
CALCIUM: 9.6 mg/dL (ref 8.9–10.3)
CO2: 25 mmol/L (ref 22–32)
Chloride: 105 mmol/L (ref 98–111)
Creatinine, Ser: 1.28 mg/dL — ABNORMAL HIGH (ref 0.44–1.00)
GFR calc Af Amer: 44 mL/min — ABNORMAL LOW (ref >60)
GFR, EST NON AFRICAN AMERICAN: 38 mL/min — AB (ref >60)
GLUCOSE: 94 mg/dL (ref 70–99)
Potassium: 4.3 mmol/L (ref 3.5–5.1)
Sodium: 138 mmol/L (ref 135–145)

## 2018-04-10 LAB — CBC
HEMATOCRIT: 40.6 % (ref 36.0–46.0)
Hemoglobin: 13.4 g/dL (ref 12.0–15.0)
MCH: 31.1 pg (ref 26.0–34.0)
MCHC: 33 g/dL (ref 30.0–36.0)
MCV: 94.2 fL (ref 78.0–100.0)
PLATELETS: 172 10*3/uL (ref 150–400)
RBC: 4.31 MIL/uL (ref 3.87–5.11)
RDW: 13.3 % (ref 11.5–15.5)
WBC: 5.4 10*3/uL (ref 4.0–10.5)

## 2018-04-10 MED ORDER — HYDROCHLOROTHIAZIDE 25 MG PO TABS: 12.5000 mg | ORAL_TABLET | Freq: Every day | ORAL | 12 refills | Status: DC

## 2018-04-10 MED ORDER — CEFDINIR 300 MG PO CAPS: 300.0000 mg | ORAL_CAPSULE | Freq: Two times a day (BID) | ORAL | 0 refills | Status: AC

## 2018-04-10 NOTE — Progress Notes (Signed)
Pt's IV catheter removed and intact. Pt's IV site clean dry and intact. Discharge instructions including medications and follow up appointments were reviewed and discussed with patient. All questions were answered and no further questions at this time. Pt in stable condition and in no acute distress at time of discharge. Pt will be escorted by nurse tech.  

## 2018-04-10 NOTE — Progress Notes (Signed)
Physical Therapy Treatment Patient Details Name: Becky Baird MRN: [id number] DOB: 12/16/1933 Today's Date: 04/10/2018    History of Present Illness Becky Baird is a 84 y.o. female with medical history significant for dyslipidemia, hypertension, prior CVA with mild cognitive impairment, anxiety, CKD stage III and paroxysmal atrial fibrillation on Xarelto, who was brought to the ED with fever and shaking chills with temperature 104 Fahrenheit at home.  Her daughters at home are concerned about the fact that she was quite somnolent.  They had difficulty arousing her at home and called EMS.  Upon arrival, EMS noted the patient was quite arousable and was able to easily answer questions.  She complained of some intermittent lower abdominal pain, but did not have any dysuria or hematuria, nausea, vomiting, or diarrhea.  She denies any chest pain, or shortness of breath.  She continues to take her medications as prescribed, but has had some questionable oral intake with diminished appetite.      PT Comments    Patient exhibited much increased tolerance for activity today. Continues to walk forward flexed at the trunk with RW and needing cues to walk inside base of support. Patient reported she uses a SPC to walk in community because she doesn't know how to fold up her RW and load it into her car. Uses RW at home.  Patient would continue to benefit from skilled physical therapy in current environment and next venue to continue return to prior function and increase strength, endurance, balance, coordination, and functional mobility and gait skills.     Follow Up Recommendations  Home health PT;Supervision for mobility/OOB;Supervision - Intermittent     Equipment Recommendations  None recommended by PT    Recommendations for Other Services       Precautions / Restrictions Precautions Precautions: Fall Restrictions Weight Bearing Restrictions: No    Mobility  Bed Mobility Overal bed  mobility: Needs Assistance Bed Mobility: Supine to Sit     Supine to sit: Supervision     General bed mobility comments: has to use bed rail to push self to sitting  Transfers Overall transfer level: Needs assistance Equipment used: Rolling walker (2 wheeled) Transfers: Sit to/from [facility] Sit to Stand: Min guard Stand pivot transfers: Min guard       General transfer comment: slightly labored movement  Ambulation/Gait Ambulation/Gait assistance: Min guard Gait Distance (Feet): 800 Feet Assistive device: Rolling walker (2 wheeled) Gait Pattern/deviations: Trunk flexed;Decreased stride length Gait velocity: decreased   General Gait Details: slow slightly labored cadence without loss of balance   Stairs             Wheelchair Mobility    Modified Rankin (Stroke Patients Only)       Balance Overall balance assessment: Needs assistance Sitting-balance support: Feet supported;No upper extremity supported Sitting balance-Leahy Scale: Good     Standing balance support: During functional activity;Bilateral upper extremity supported Standing balance-Leahy Scale: Fair Standing balance comment: w/ RW                            Cognition Arousal/Alertness: Awake/alert Behavior During Therapy: WFL for tasks assessed/performed Overall Cognitive Status: Within Functional Limits for tasks assessed                                        Exercises  General Comments        Pertinent Vitals/Pain Pain Assessment: No/denies pain    Home Living                      Prior Function            PT Goals (current goals can now be found in the care plan section) Progress towards PT goals: Progressing toward goals    Frequency    Min 3X/week      PT Plan Current plan remains appropriate    Co-evaluation              AM-PAC PT "6 Clicks" Daily Activity  Outcome Measure  Difficulty  turning over in bed (including adjusting bedclothes, sheets and blankets)?: None Difficulty moving from lying on back to sitting on the side of the bed? : None Difficulty sitting down on and standing up from a chair with arms (e.g., wheelchair, bedside commode, etc,.)?: None Help needed moving to and from a bed to chair (including a wheelchair)?: A Little Help needed walking in hospital room?: A Little Help needed climbing 3-5 steps with a railing? : A Lot 6 Click Score: 20    End of Session   Activity Tolerance: Patient tolerated treatment well Patient left: in chair;with family/visitor present Nurse Communication: Mobility status PT Visit Diagnosis: Unsteadiness on feet (R26.81);Other abnormalities of gait and mobility (R26.89);Muscle weakness (generalized) (M62.81)     Time: 3557-3220 PT Time Calculation (min) (ACUTE ONLY): 30 min  Charges:  $Gait Training: 8-22 mins $Therapeutic Activity: 8-22 mins                     Floria Raveling. Hartnett-Rands, MS, PT Per [place] [phone] 04/10/2018, 10:19 AM

## 2018-04-10 NOTE — Discharge Instructions (Signed)
1)Avoid ibuprofen/Advil/Aleve/Motrin/Goody Powders/Naproxen/BC powders/Meloxicam/Diclofenac/Indomethacin and other Nonsteroidal anti-inflammatory medications as these will make you more likely to bleed and can cause stomach ulcers, can also cause Kidney problems.   2)Generalized weakness and debility--- Outpatient physical therapy 2-3 times a week for 3 to 4 weeks  3) you have E. coli and Klebsiella infection in your urine and blood----take Omnicef/cefdinir antibiotic twice a day for the next week as prescribed  4) reduce hydrochlorothiazide/HCTZ to 12.5 mg daily--- do not restart it  on 10 04/15/2018  5)PCP within a week for repeat CBC and repeat BMP test

## 2018-04-10 NOTE — Discharge Summary (Signed)
Becky Baird, is a 84 y.o. female  DOB 06/22/1934  MRN [id number].  Admission date:  04/07/2018  Admitting Physician  Albright, DO  Discharge Date:  04/10/2018   Primary MD  Kathyrn Drown, MD  Recommendations for primary care physician for things to follow:   1)Avoid ibuprofen/Advil/Aleve/Motrin/Goody Powders/Naproxen/BC powders/Meloxicam/Diclofenac/Indomethacin and other Nonsteroidal anti-inflammatory medications as these will make you more likely to bleed and can cause stomach ulcers, can also cause Kidney problems.   2)Generalized weakness and debility--- Outpatient physical therapy 2-3 times a week for 3 to 4 weeks  3) you have E. coli and Klebsiella infection in your urine and blood----take Omnicef/cefdinir antibiotic twice a day for the next week as prescribed  4) reduce hydrochlorothiazide/HCTZ to 12.5 mg daily--- do not restart it  on 10 04/15/2018  5)PCP within a week for repeat CBC and repeat BMP test   Admission Diagnosis  Dehydration [E86.0] Pyelonephritis [N12]   Discharge Diagnosis  Dehydration [E86.0] Pyelonephritis [N12]    Principal Problem:   Bacteremia/Infection due to ESBL-producing Escherichia coli Active Problems:   Hypertension   Hyperlipidemia   History of stroke   Paroxysmal atrial fibrillation (HCC)   Acute metabolic encephalopathy   Acute pyelonephritis   AKI (acute kidney injury) ([place])   Hypokalemia   Hyponatremia   Pyelonephritis      Past Medical History:  Diagnosis Date  . Anxiety   . Chronic back pain   . Hyperlipidemia   . Hypertension   . Memory loss   . Mild cognitive impairment, so stated   . Occlusion and stenosis of carotid artery with cerebral infarction    Bilateral less than 50% carotid stenosis.  . Persistent atrial fibrillation ([place]) 06/23/2016  . Renal cyst   . Sciatica   . Stroke ([place])    2010, RMCA with stent  . Toe  fracture 11/2015   R toe  . Ulcer 1970s    Past Surgical History:  Procedure Laterality Date  . COLONOSCOPY  03/10/2011   Procedure: COLONOSCOPY;  Surgeon: Rogene Houston, MD;  [facility];  Service: Endoscopy;  Laterality: N/A;  . COLONOSCOPY N/A 08/24/2016   Procedure: COLONOSCOPY;  Surgeon: Rogene Houston, MD;  [facility];  Service: Endoscopy;  Laterality: N/A;  930  . DILATION AND CURETTAGE OF UTERUS     years ago  . OVARIAN CYST REMOVAL     at least 10 years ago  . POLYPECTOMY  08/24/2016   Procedure: POLYPECTOMY;  Surgeon: Rogene Houston, MD;  [facility];  Service: Endoscopy;;  ascending colon; hepatic flexure  . TONSILLECTOMY     childhood       HPI  from the history and physical done on the day of admission:   Chief Complaint: AMS/fever/chills  HPI: Becky Baird is a 84 y.o. female with medical history significant for dyslipidemia, hypertension, prior CVA with mild cognitive impairment, anxiety, CKD stage III and paroxysmal atrial fibrillation on Xarelto, who was brought to  the ED with fever and shaking chills with temperature 104 Fahrenheit at home.  Her daughters at home are concerned about the fact that she was quite somnolent.  They had difficulty arousing her at home and called EMS.  Upon arrival, EMS noted the patient was quite arousable and was able to easily answer questions.  She complained of some intermittent lower abdominal pain, but did not have any dysuria or hematuria, nausea, vomiting, or diarrhea.  She denies any chest pain, or shortness of breath.  She continues to take her medications as prescribed, but has had some questionable oral intake with diminished appetite.     ED Course: Vital signs are stable and laboratory data indicates mild leukocytosis of 11,400, hemoglobin of 11.5, platelets of 146, sodium 132, potassium 3.3, BUN 27, and creatinine 1.78 with usual baseline of approximately 1.3.  She is noted to have  findings of UTI on urine analysis and CT of the abdomen and pelvis demonstrates some findings of pyelonephritis along with bilateral hydroureteronephrosis.  Two-view chest x-ray and CT of the head with no acute findings.    Hospital Course:   Brief Summary:-  84 year old admitted with lethargy/altered mentation, admitted on 04/07/2018 found to have right-sided pyelonephritis, blood and urine cultures with   E. Coli, started on meropenem on 04/08/2018, ESBL not confirmed this time patient had ESBL in June 2019, patient also has Klebsiella in the urine but not in the blood switching back to IV Rocephin as of 04/09/2018, afebrile, no leukocytosis discharge on Omnicef on 04/10/2018  Plan:  1)AKI----acute kidney injury on CKD stage - III,      creatinine on admission= 1.78  ,   baseline creatinine =  1.2 to 1.3  , creatinine is now= 1.28, improved with IV fluids, renally adjust medications, avoid nephrotoxic agents/dehydration/hypotension,  CT abd/Pelvis with Mild bilateral hydroureteronephrosis with perinephric edema, left greater than right, renal ultrasound without hydronephrosis or significant obstructive uropathy, patient is voiding well  2)E. coli  Bacteremia/pyelonephritis--on admission patient was on IV Rocephin from 04/07/18 to  04/08/2018,  started on meropenem on 04/08/2018, ESBL not confirmed this time patient had ESBL in June 2019, switching back to IV Rocephin as of 04/09/2018, urine culture also has Klebsiella afebrile, no leukocytosis discharge on Omnicef on 04/10/2018  3)Dehydration/mild Hyponatremia/mild Hypokalemia--improved with hydration,   4)Lethargy/acute metabolic encephalopathy--overall much improved, patient is talkative and interactive at this time, on admission she was lethargic and mostly nonresponsive verbally , due to E. coli bacteremia/pyelonephritis, as well as Klebsiella UTI compounded by dehydration and AKI  5)Acute Anemia--- baseline hemoglobin usually around 12, Hgb is  now above 13, suspect initial hemoglobin drop was secondary to hemodilution  6)PAFib--stable, continue Xarelto for anticoagulation, rate appears controlled without any AV nodal agents  7)Generalized Weakness/Debility--- PT evaluation, fall precautions, recommend skilled nursing facility rehab, patient declines SNF , patient wants to go home with outpatient physical therapy   Code Status : Full   Disposition Plan  :  Home with outpatient PT, family will be with patient for safety and supervision 24/7  Consults  :  PT  DVT Prophylaxis  :  Xarelto   Discharge Condition: stable  Follow UP-PCP for recheck BMP and CBC in 1 week  Diet and Activity recommendation:  As advised  Discharge Instructions    Discharge Instructions    Call MD for:  difficulty breathing, headache or visual disturbances   Complete by:  As directed    Call MD for:  persistant dizziness or light-headedness  Complete by:  As directed    Call MD for:  persistant nausea and vomiting   Complete by:  As directed    Call MD for:  severe uncontrolled pain   Complete by:  As directed    Call MD for:  temperature >100.4   Complete by:  As directed    Diet - low sodium heart healthy   Complete by:  As directed    Discharge instructions   Complete by:  As directed    1)Avoid ibuprofen/Advil/Aleve/Motrin/Goody Powders/Naproxen/BC powders/Meloxicam/Diclofenac/Indomethacin and other Nonsteroidal anti-inflammatory medications as these will make you more likely to bleed and can cause stomach ulcers, can also cause Kidney problems.   2)Generalized weakness and debility--- Outpatient physical therapy 2-3 times a week for 3 to 4 weeks  3) you have E. coli and Klebsiella infection in your urine and blood----take Omnicef/cefdinir antibiotic twice a day for the next week as prescribed  4) reduce hydrochlorothiazide/HCTZ to 12.5 mg daily--- do not restart it  on 10 04/15/2018  5)PCP within a week for repeat CBC and repeat  BMP test   Increase activity slowly   Complete by:  As directed    Generalized weakness and debility--- Outpatient physical therapy 2-3 times a week for 3 to 4 weeks        Discharge Medications     Allergies as of 04/10/2018      Reactions   Amlodipine    Pedal edema   Niaspan [niacin Er] Rash      Medication List    TAKE these medications   ALPRAZolam 0.5 MG tablet Commonly known as:  XANAX TAKE 1/2 TABLET BY MOUTH EVERY 6 HOURS AS NEEDED FOR ANXIETY AND TAKE 1/2 TO 1 TABLET AT BEDTIME AS NEEDED FOR SLEEP   carboxymethylcellulose 0.5 % Soln Commonly known as:  REFRESH PLUS Place 1 drop into both eyes daily as needed (dry eyes).   cefdinir 300 MG capsule Commonly known as:  OMNICEF Take 1 capsule (300 mg total) by mouth 2 (two) times daily for 7 days. Start 04/11/18   clobetasol ointment 0.05 % Commonly known as:  TEMOVATE Apply 1 application topically 2 (two) times daily as needed.   estradiol 0.1 MG/GM vaginal cream Commonly known as:  ESTRACE Place 1 Applicatorful vaginally once a week.   fish oil-omega-3 fatty acids 1000 MG capsule Take 2 g by mouth every morning.   FUSION PLUS Caps Take 1 capsule by mouth every morning.   gabapentin 300 MG capsule Commonly known as:  NEURONTIN Take 1 capsule (300 mg total) by mouth at bedtime.   hydrochlorothiazide 25 MG tablet Commonly known as:  HYDRODIURIL Take 0.5 tablets (12.5 mg total) by mouth daily. Start 04/15/18 What changed:    how much to take  additional instructions   loratadine 10 MG tablet Commonly known as:  CLARITIN Take 1 tablet (10 mg total) by mouth daily. What changed:  when to take this   OVER THE COUNTER MEDICATION Take 1 capsule by mouth every morning. SUPREMA DOPHILUS   pantoprazole 40 MG tablet Commonly known as:  PROTONIX TAKE ONE TABLET BY MOUTH DAILY.   potassium chloride 10 MEQ tablet Commonly known as:  K-DUR,KLOR-CON TAKE ONE TABLET BY MOUTH TWICE DAILY   Red Yeast Rice  600 MG Caps Take 1,200 mg by mouth every morning.   Rivaroxaban 15 MG Tabs tablet Commonly known as:  XARELTO Take 1 tablet (15 mg total) by mouth daily with supper.   sodium chloride 0.65 %  nasal spray Commonly known as:  OCEAN Place 1 spray into the nose as needed for congestion.       Major procedures and Radiology Reports - PLEASE review detailed and final reports for all details, in brief -   Ct Abdomen Pelvis Wo Contrast  Result Date: 04/07/2018 CLINICAL DATA:  Lower abdominal pain and fever. EXAM: CT ABDOMEN AND PELVIS WITHOUT CONTRAST TECHNIQUE: Multidetector CT imaging of the abdomen and pelvis was performed following the standard protocol without IV contrast. COMPARISON:  CT 10/17/2007 FINDINGS: Lower chest: Mild hypoventilatory atelectasis. No pleural fluid. Coronary artery calcifications. Hepatobiliary: No focal liver abnormality is seen. No gallstones, gallbladder wall thickening, or biliary dilatation. Pancreas: No ductal dilatation or inflammation. Spleen: Calcified granuloma.  Normal in size. Adrenals/Urinary Tract: Mild left adrenal thickening without dominant nodule. Normal right adrenal gland. Mild left hydroureteronephrosis with moderate perinephric and periureteric stranding. No urolithiasis or cause of obstruction. Mild right hydroureteronephrosis with perinephric and periureteric edema. Simple cyst in the upper right kidney measures 6.6 cm. Urinary bladder is distended with mild wall thickening about the dome. No emphysematous changes are air in the collecting system. No evidence of perirenal fluid collection on noncontrast exam. Stomach/Bowel: Stomach is nondistended. Lack of enteric contrast limits bowel evaluation. No evidence of bowel wall thickening, inflammatory change or obstruction. Colonic diverticulosis, prominent in the distal descending and sigmoid without diverticulitis. Normal appendix. Vascular/Lymphatic: Mild aorta bi-iliac atherosclerosis. Prominent  retroperitoneal nodes are likely reactive. No bulky adenopathy. Reproductive: Uterus and bilateral adnexa are unremarkable. Other: No free air, free fluid, or intra-abdominal fluid collection. Musculoskeletal: Scoliosis and degenerative change throughout spine. Bones are under mineralized. Suspect remote left sacral insufficiency fracture. IMPRESSION: 1. Mild bilateral hydroureteronephrosis with perinephric edema, left greater than right. No obstructing stones. Findings suspicious for pyelonephritis. No evidence of perirenal abscess on noncontrast exam. 2. Colonic diverticulosis without diverticulitis. Aortic Atherosclerosis (ICD10-I70.0). Electronically Signed   By: Keith Rake M.D.   On: 04/07/2018 03:57   Dg Chest 2 View  Result Date: 04/07/2018 CLINICAL DATA:  Altered mental status. EXAM: CHEST - 2 VIEW COMPARISON:  Radiograph 05/12/2016 FINDINGS: Lower lung volumes from prior exam.The cardiomediastinal contours are normal. Mild scarring in the right midlung. Pulmonary vasculature is normal. No consolidation, pleural effusion, or pneumothorax. No acute osseous abnormalities are seen. IMPRESSION: No acute chest finding. Electronically Signed   By: Keith Rake M.D.   On: 04/07/2018 03:58   Ct Head Wo Contrast  Result Date: 04/07/2018 CLINICAL DATA:  Altered level of consciousness. EXAM: CT HEAD WITHOUT CONTRAST TECHNIQUE: Contiguous axial images were obtained from the base of the skull through the vertex without intravenous contrast. COMPARISON:  Head CT and brain MRI 05/12/2016 FINDINGS: Brain: No acute hemorrhage. Unchanged atrophy and chronic small vessel ischemia. Unchanged remote infarct in right MCA distribution, and chronic lacunar infarcts in the right caudate. No evidence of acute ischemia. No hydrocephalus, the basilar cisterns are patent. No subdural collection. Vascular: Stent in the right MCA.  No hyperdense vessel. Skull: No fracture or focal lesion. Sinuses/Orbits: Paranasal  sinuses and mastoid air cells are clear. The visualized orbits are unremarkable. Other: None. IMPRESSION: 1.  No acute intracranial abnormality. 2. Unchanged atrophy, chronic small vessel ischemia, prior right MCA infarcts and stent. Electronically Signed   By: Keith Rake M.D.   On: 04/07/2018 03:47   US Renal  Result Date: 04/08/2018 CLINICAL DATA:  Lower abdominal pain, fever of unknown origin, UTI, renal cysts on CT EXAM: RENAL / URINARY TRACT ULTRASOUND COMPLETE  COMPARISON:  CT abdomen/pelvis dated 04/07/2018 FINDINGS: Right Kidney: Length: 10.2 cm. 5.3 x 7.1 x 5.7 cm upper pole simple cyst. 1.4 x 1.3 x 1.1 cm interpolar simple cyst. No hydronephrosis. Left Kidney: Length: 10.4 cm. 1.1 x 0.7 x 0.9 cm interpolar simple cyst. No hydronephrosis. Bladder: Bladder is within normal limits. IMPRESSION: Bilateral renal cysts, including a dominant 7.1 cm right upper pole simple cyst, benign. No hydronephrosis. Electronically Signed   By: Julian Hy M.D.   On: 04/08/2018 08:54    Micro Results   Recent Results (from the past 240 hour(s))  Blood culture (routine x 2)     Status: Abnormal   Collection Time: 04/07/18  2:20 AM  Result Value Ref Range Status   Specimen Description   Final    BLOOD RIGHT ARM Performed at [facility], [address], [place], [place] [address]    Special Requests   Final    BOTTLES DRAWN AEROBIC AND ANAEROBIC Blood Culture results may not be optimal due to an excessive volume of blood received in culture bottles Performed at [facility], [address], [place], [place] [address]    Culture  Setup Time   Final    GRAM NEGATIVE RODS IN BOTH AEROBIC AND ANAEROBIC BOTTLES Gram Stain Report Called to,Read Back By and Verified With: VIVENS @ 1425 ON [id number] BY HENDERSON L. CRITICAL RESULT CALLED TO, READ BACK BY AND VERIFIED WITH: L BIVENS,RN AT 1727 04/07/18 BY L BENFIELD Performed at [facility], [place] [address] [place], [place] [address]    Culture  ESCHERICHIA COLI (A)  Final   Report Status 04/09/2018 FINAL  Final   Organism ID, Bacteria ESCHERICHIA COLI  Final      Susceptibility   Escherichia coli - MIC*    AMPICILLIN >=32 RESISTANT Resistant     CEFAZOLIN <=4 SENSITIVE Sensitive     CEFEPIME <=1 SENSITIVE Sensitive     CEFTAZIDIME <=1 SENSITIVE Sensitive     CEFTRIAXONE <=1 SENSITIVE Sensitive     CIPROFLOXACIN <=0.25 SENSITIVE Sensitive     GENTAMICIN <=1 SENSITIVE Sensitive     IMIPENEM <=0.25 SENSITIVE Sensitive     TRIMETH/SULFA <=20 SENSITIVE Sensitive     AMPICILLIN/SULBACTAM 8 SENSITIVE Sensitive     PIP/TAZO <=4 SENSITIVE Sensitive     Extended ESBL NEGATIVE Sensitive     * ESCHERICHIA COLI  Blood Culture ID Panel (Reflexed)     Status: Abnormal   Collection Time: 04/07/18  2:20 AM  Result Value Ref Range Status   Enterococcus species NOT DETECTED NOT DETECTED Final   Listeria monocytogenes NOT DETECTED NOT DETECTED Final   Staphylococcus species NOT DETECTED NOT DETECTED Final   Staphylococcus aureus NOT DETECTED NOT DETECTED Final   Streptococcus species NOT DETECTED NOT DETECTED Final   Streptococcus agalactiae NOT DETECTED NOT DETECTED Final   Streptococcus pneumoniae NOT DETECTED NOT DETECTED Final   Streptococcus pyogenes NOT DETECTED NOT DETECTED Final   Acinetobacter baumannii NOT DETECTED NOT DETECTED Final   Enterobacteriaceae species DETECTED (A) NOT DETECTED Final    Comment: Enterobacteriaceae represent a large family of gram-negative bacteria, not a single organism. CRITICAL RESULT CALLED TO, READ BACK BY AND VERIFIED WITH: L BIVENS,RN AT 1727 04/07/18 BY L BENFIELD    Enterobacter cloacae complex NOT DETECTED NOT DETECTED Final   Escherichia coli DETECTED (A) NOT DETECTED Final    Comment: CRITICAL RESULT CALLED TO, READ BACK BY AND VERIFIED WITH: L BIVENS,RN AT 1727 04/07/18 BY L BENFIELD  Klebsiella oxytoca NOT DETECTED NOT DETECTED Final   Klebsiella pneumoniae NOT DETECTED NOT DETECTED  Final   Proteus species NOT DETECTED NOT DETECTED Final   Serratia marcescens NOT DETECTED NOT DETECTED Final   Carbapenem resistance NOT DETECTED NOT DETECTED Final   Haemophilus influenzae NOT DETECTED NOT DETECTED Final   Neisseria meningitidis NOT DETECTED NOT DETECTED Final   Pseudomonas aeruginosa NOT DETECTED NOT DETECTED Final   Candida albicans NOT DETECTED NOT DETECTED Final   Candida glabrata NOT DETECTED NOT DETECTED Final   Candida krusei NOT DETECTED NOT DETECTED Final   Candida parapsilosis NOT DETECTED NOT DETECTED Final   Candida tropicalis NOT DETECTED NOT DETECTED Final    Comment: Performed at [facility], [place] [address], [place], [place] [address]  Blood culture (routine x 2)     Status: Abnormal   Collection Time: 04/07/18  2:25 AM  Result Value Ref Range Status   Specimen Description   Final    BLOOD LEFT WRIST Performed at [facility], [address], [place], [place] [address]    Special Requests   Final    BOTTLES DRAWN AEROBIC AND ANAEROBIC Blood Culture results may not be optimal due to an excessive volume of blood received in culture bottles Performed at [facility], [address], [place], [place] [address]    Culture  Setup Time   Final    GRAM NEGATIVE RODS AEROBIC BOTTLE ONLY Gram Stain Report Called to,Read Back By and Verified With: VIVENS @ 6754 ON [id number] BY HENDERSON L Organism ID to follow    Culture (A)  Final    ESCHERICHIA COLI SUSCEPTIBILITIES PERFORMED ON PREVIOUS CULTURE WITHIN THE LAST 5 DAYS. Performed at [facility], [place] [address] [place], [place] [address]    Report Status 04/09/2018 FINAL  Final  Urine Culture     Status: Abnormal   Collection Time: 04/07/18  4:03 AM  Result Value Ref Range Status   Specimen Description   Final    URINE, CATHETERIZED Performed at [facility], [address], [place], [place] [address]    Special Requests   Final    NONE Performed at [facility], [address],  [place], [place] [address]    Culture (A)  Final    >=100,000 COLONIES/mL ESCHERICHIA COLI >=100,000 COLONIES/mL KLEBSIELLA PNEUMONIAE    Report Status 04/09/2018 FINAL  Final   Organism ID, Bacteria ESCHERICHIA COLI (A)  Final   Organism ID, Bacteria KLEBSIELLA PNEUMONIAE (A)  Final      Susceptibility   Escherichia coli - MIC*    AMPICILLIN >=32 RESISTANT Resistant     CEFAZOLIN <=4 SENSITIVE Sensitive     CEFTRIAXONE <=1 SENSITIVE Sensitive     CIPROFLOXACIN <=0.25 SENSITIVE Sensitive     GENTAMICIN <=1 SENSITIVE Sensitive     IMIPENEM <=0.25 SENSITIVE Sensitive     NITROFURANTOIN <=16 SENSITIVE Sensitive     TRIMETH/SULFA <=20 SENSITIVE Sensitive     AMPICILLIN/SULBACTAM >=32 RESISTANT Resistant     PIP/TAZO <=4 SENSITIVE Sensitive     Extended ESBL NEGATIVE Sensitive     * >=100,000 COLONIES/mL ESCHERICHIA COLI   Klebsiella pneumoniae - MIC*    AMPICILLIN >=32 RESISTANT Resistant     CEFAZOLIN <=4 SENSITIVE Sensitive     CEFTRIAXONE <=1 SENSITIVE Sensitive     CIPROFLOXACIN <=0.25 SENSITIVE Sensitive     GENTAMICIN <=1 SENSITIVE Sensitive     IMIPENEM <=0.25 SENSITIVE Sensitive     NITROFURANTOIN <=16 SENSITIVE Sensitive  TRIMETH/SULFA <=20 SENSITIVE Sensitive     AMPICILLIN/SULBACTAM 4 SENSITIVE Sensitive     PIP/TAZO <=4 SENSITIVE Sensitive     Extended ESBL NEGATIVE Sensitive     * >=100,000 COLONIES/mL KLEBSIELLA PNEUMONIAE       Today   Subjective    Malayshia All today has no New complaints, no fevers, no chills, eating and drinking well, no vomiting, no diarrhea, daughter at bedside, questions answered         No dysuria no flank pain   Patient has been seen and examined prior to discharge   Objective   Blood pressure (!) 142/61, pulse 65, temperature 98.5 F (36.9 C), temperature source Oral, resp. rate 18, height 5\' 7"  (1.702 m), weight 64.5 kg, SpO2 91 %.   Intake/Output Summary (Last 24 hours) at 04/10/2018 1051 Last data filed at 04/10/2018  0900 Gross per 24 hour  Intake 1698.08 ml  Output 600 ml  Net 1098.08 ml    Exam Patient is examined daily including today on 04/10/18 , exams remain the same as of yesterday except that has changed   Gen:- Awake Alert, more awake, more interactive HEENT:- [place].AT, No sclera icterus Neck-Supple Neck,No JVD,.  Lungs-diminished in bases, no wheezing CV- S1, S2 normal, irregularly irregular Abd-  +ve B.Sounds, Abd Soft, right-sided CVA tenderness has resolved   Extremity/Skin:- No  edema,   good pulses Psych-affect is appropriate, oriented x3 Neuro-generalized weakness without new focal deficits, no tremors   Data Review   CBC w Diff:  Lab Results  Component Value Date   WBC 5.4 04/10/2018   HGB 13.4 04/10/2018   HGB 13.3 11/23/2017   HCT 40.6 04/10/2018   HCT 37.4 11/23/2017   PLT 172 04/10/2018   PLT 298 11/23/2017   LYMPHOPCT 7 04/07/2018   MONOPCT 8 04/07/2018   EOSPCT 0 04/07/2018   BASOPCT 0 04/07/2018    CMP:  Lab Results  Component Value Date   NA 138 04/10/2018   NA 139 11/23/2017   K 4.3 04/10/2018   CL 105 04/10/2018   CO2 25 04/10/2018   BUN 16 04/10/2018   BUN 13 11/23/2017   CREATININE 1.28 (H) 04/10/2018   CREATININE 1.21 (H) 05/29/2014   PROT 6.5 04/07/2018   PROT 7.3 11/23/2017   ALBUMIN 3.3 (L) 04/07/2018   ALBUMIN 4.5 11/23/2017   BILITOT 1.1 04/07/2018   BILITOT 0.7 11/23/2017   ALKPHOS 54 04/07/2018   AST 32 04/07/2018   ALT 20 04/07/2018  .   Total Discharge time is about 33 minutes  Roxan Hockey M.D on 04/10/2018 at 10:51 AM  Pager---[phone]  Go to [email or web address] - password [email or web address] for contact info  [facility] - Office  [phone]

## 2018-04-10 NOTE — Care Management (Addendum)
Patient now elects Out patient PT with [facility]. CM well send referral.  ADDENDUM: Referral sent. CM contacted by Joellen Jersey of [facility], patient is already receiving OP PT with [facility].

## 2018-04-10 NOTE — Clinical Social Work Note (Addendum)
Patient is going home and has elected out patient PT.  LCSW signing off.   Raijon Lindfors, Clydene Pugh, LCSW

## 2018-04-12 ENCOUNTER — Telehealth: Payer: Self-pay | Admitting: Family Medicine

## 2018-04-12 NOTE — Telephone Encounter (Signed)
Pt was discharged from the hospital Tuesday. I have scheduled hospital follow up for 04/17/18. The hospital recommended repeat CBC and BMP.

## 2018-04-12 NOTE — Telephone Encounter (Signed)
We can hold off on any labs The patient should bring her medications with her when she comes

## 2018-04-12 NOTE — Telephone Encounter (Signed)
Dtr [name] is aware.

## 2018-04-12 NOTE — Telephone Encounter (Signed)
Left message to return call 

## 2018-04-17 ENCOUNTER — Encounter: Payer: Self-pay | Admitting: Family Medicine

## 2018-04-17 ENCOUNTER — Ambulatory Visit (INDEPENDENT_AMBULATORY_CARE_PROVIDER_SITE_OTHER): Payer: Medicare Other | Admitting: Family Medicine

## 2018-04-17 VITALS — BP 132/70 | Ht 67.0 in | Wt 124.0 lb

## 2018-04-17 DIAGNOSIS — R1012 Left upper quadrant pain: Secondary | ICD-10-CM

## 2018-04-17 DIAGNOSIS — R634 Abnormal weight loss: Secondary | ICD-10-CM | POA: Diagnosis not present

## 2018-04-17 DIAGNOSIS — R197 Diarrhea, unspecified: Secondary | ICD-10-CM | POA: Diagnosis not present

## 2018-04-17 DIAGNOSIS — R5381 Other malaise: Secondary | ICD-10-CM

## 2018-04-17 DIAGNOSIS — Z09 Encounter for follow-up examination after completed treatment for conditions other than malignant neoplasm: Secondary | ICD-10-CM

## 2018-04-17 MED ORDER — HYDROCHLOROTHIAZIDE 12.5 MG PO TABS: 12.5000 mg | ORAL_TABLET | Freq: Every day | ORAL | 3 refills | Status: DC

## 2018-04-17 NOTE — Progress Notes (Signed)
Subjective:    Patient ID: KALIEGH WILLADSEN, female    DOB: 06-Jul-1934, 84 y.o.   MRN: [id number]  HPI   Pt here today for hospital follow up. Pt was at [facility] for E.Coli from 9/14-9/17. Pt daughter wanted to know if BP med should be decreased to 12.5 like in the hospital and if so can they get smaller pill because the one now is tiny and hard to cut in half.   Feels very weak, gets tired and winded easily.  Reports stomach is very sensitive when she eats or takes pills, reports a burning sensation in stomach, but not described as heartburn.  Reports nausea and incontinence of stool - loose stools. Denies urinary incontinence. Denies fever at home. Reports weight loss could be accurate - states clothes not fitting well. Denies any dysuria since being home.   No falls. Daughter is staying with patient in the home 24/7 currently.  Needs set up for PT.  Reports decreased appetite, eating decreased volume of food - asking about boost or ensure Still having confusion since being home from hospital Concerned about tremors  Review of Systems  Constitutional: Positive for activity change, appetite change and unexpected weight change. Negative for fever.  Respiratory: Positive for shortness of breath.   Gastrointestinal: Positive for abdominal pain, diarrhea and nausea.  Genitourinary: Negative for dysuria.  Neurological: Positive for tremors and weakness.       Objective:   Physical Exam  Constitutional: No distress.  HENT:  Head: Normocephalic and atraumatic.  Eyes: Right eye exhibits no discharge. Left eye exhibits no discharge.  Neck: Neck supple.  Cardiovascular: Normal rate and regular rhythm.  Murmur (loudest at right sternal border) heard. Pulmonary/Chest: Effort normal and breath sounds normal. No respiratory distress. She has no wheezes.  Abdominal: Soft. There is tenderness (LUQ).  Lymphadenopathy:    She has no cervical adenopathy.  Neurological: She is alert.  Skin:  Skin is warm and dry.  Psychiatric: She has a normal mood and affect.  Nursing note and vitals reviewed.     Assessment & Plan:  1. Hospital discharge follow-up  Pt has been discharged home, her daughter is staying with her at all times currently. They have removed fall hazards in the home. Lengthy discussion took place with myself and Dr. Nicki Reaper regarding multiple concerns after hospital discharge. Discussed at length that it will likely take several weeks to months to return to baseline after an illness such as she has endured. She has a lot of weakness and lack of stamina most likely due to deconditioning, recommended home health referral and PT at home, may transition to PT in an office setting once her strength improves.  Very concerned about her weight loss of almost 20 lbs since July and decreased appetite as well as stomach burning with eating and LUQ pain.  Recommended they keep track of her weights at home and update [place] on her status, encouraged improved nutrition with a nutritional supplement such as glucerna, as the decreased sugar content may help prevent further diarrhea.  We will refer to GI for possible endoscopy to look at the lining of her stomach for further evaluation of her pain. Pt sent home with urine collection cup to collect a urine specimen later this week or early next week as she finished her abx today.  Also sent home with collection cup for C. Diff testing, as this may be possible d/t recent hospitalization and abx use.  Agreed with hospitalist at  discharge that it is appropriate to decrease hydrochlorothiazide dose to 12.5 mg daily, new rx was sent in for patient as requested.  We will have patient f/u with Dr. Nicki Reaper in 4 weeks to reassess how she is doing.  Left upper quadrant pain - Plan: Clostridium Difficile by PCR, Ambulatory referral to Gastroenterology, Ambulatory referral to [place], Ambulatory referral to Physical Therapy  Weight loss - Plan: Clostridium  Difficile by PCR, Ambulatory referral to Gastroenterology, Ambulatory referral to [place], Ambulatory referral to Physical Therapy  Physical deconditioning Face-to-face evaluation was completed Home health is indicated Home health physical therapy initially indicated Once able to do well care will promote physical therapy at the physical therapy department  As attending physician to this patient visit, this patient was seen in conjunction with the nurse practitioner.  The history,physical and treatment plan was reviewed with the nurse practitioner and pertinent findings were verified with the patient.  Also the treatment plan was reviewed with the patient while they were present.

## 2018-04-19 ENCOUNTER — Telehealth: Payer: Self-pay | Admitting: Family Medicine

## 2018-04-19 NOTE — Telephone Encounter (Signed)
FYI only,  Barista at [facility] called.  They got the referral for patient. She has spoke with the patient and patient's daughter [name].  Patient is refusing for nurse to come out but she will allow physical therapy in home but not until next Tuesday.    Jinny Blossom just wanted [place] to be aware of this.

## 2018-04-20 NOTE — Telephone Encounter (Signed)
So noted 

## 2018-04-23 ENCOUNTER — Telehealth: Payer: Self-pay

## 2018-04-23 ENCOUNTER — Telehealth: Payer: Self-pay | Admitting: Cardiology

## 2018-04-23 ENCOUNTER — Telehealth (INDEPENDENT_AMBULATORY_CARE_PROVIDER_SITE_OTHER): Payer: Medicare Other | Admitting: *Deleted

## 2018-04-23 ENCOUNTER — Encounter (INDEPENDENT_AMBULATORY_CARE_PROVIDER_SITE_OTHER): Payer: Self-pay | Admitting: *Deleted

## 2018-04-23 ENCOUNTER — Encounter (INDEPENDENT_AMBULATORY_CARE_PROVIDER_SITE_OTHER): Payer: Self-pay | Admitting: Internal Medicine

## 2018-04-23 ENCOUNTER — Other Ambulatory Visit: Payer: Self-pay | Admitting: *Deleted

## 2018-04-23 ENCOUNTER — Ambulatory Visit (INDEPENDENT_AMBULATORY_CARE_PROVIDER_SITE_OTHER): Payer: Medicare Other | Admitting: Internal Medicine

## 2018-04-23 VITALS — BP 180/80 | HR 64 | Temp 98.0°F | Ht 67.5 in | Wt 136.6 lb

## 2018-04-23 DIAGNOSIS — R1012 Left upper quadrant pain: Secondary | ICD-10-CM | POA: Diagnosis not present

## 2018-04-23 DIAGNOSIS — R110 Nausea: Secondary | ICD-10-CM | POA: Diagnosis not present

## 2018-04-23 DIAGNOSIS — N390 Urinary tract infection, site not specified: Secondary | ICD-10-CM

## 2018-04-23 DIAGNOSIS — R634 Abnormal weight loss: Secondary | ICD-10-CM

## 2018-04-23 LAB — POCT URINALYSIS DIPSTICK
SPEC GRAV UA: 1.01 (ref 1.010–1.025)
pH, UA: 6 (ref 5.0–8.0)

## 2018-04-23 NOTE — Telephone Encounter (Signed)
New Message         Dr Laural Golden office is calling today Becky Baird) to get permission for patient to stop taking "Sheila Oats" starting tonight, the office needs a  Call back today to confirm this. Patient needs to stop tonight.

## 2018-04-23 NOTE — Patient Instructions (Signed)
The risks of bleeding, perforation and infection were reviewed with patient.  

## 2018-04-23 NOTE — Progress Notes (Signed)
Subjective:    Patient ID: Becky Baird, female    DOB: July 31, 1933, 84 y.o.   MRN: [id number]  HPI Referred by Dr. Wolfgang Phoenix for epigastric pain/weight loss. [facility] discharge from [facility] for UTI with E coli and Klebsiella and in her blood  (Acute pyelonephritis)., She was discharge with Omnice BID x 7 days. Had been having chills and fever.  At [place] with Cheyenne Adas NP, she was very weak and tired. Stomach sensitive. Has a burning sensation in stomach, but not heartburn. Daughter is staying with patient.  Patient states she is trying to find a way to put weight on. She has a hard time eating. She says it burns in her stomach when she eats. Symptoms started in April.  She has some nausea. Nausea for at least 6 months. Family states she will not eat. She has a BM daily. No recent fever. Normal weight around 146   Underwent a CT abdomen/pelvis wo CM 04/07/2018 which revealed:  IMPRESSION: 1. Mild bilateral hydroureteronephrosis with perinephric edema, left greater than right. No obstructing stones. Findings suspicious for pyelonephritis. No evidence of perirenal abscess on noncontrast exam. 2. Colonic diverticulosis without diverticulitis. Aortic Atherosclerosis (ICD10-I70.0).    Hx  of paroxysmal atrial fib and maintained on Xarelto, CKD stage 3   Review of Systems Past Medical History:  Diagnosis Date  . Anxiety   . Chronic back pain   . Hyperlipidemia   . Hypertension   . Memory loss   . Mild cognitive impairment, so stated   . Occlusion and stenosis of carotid artery with cerebral infarction    Bilateral less than 50% carotid stenosis.  . Persistent atrial fibrillation ([place]) 06/23/2016  . Renal cyst   . Sciatica   . Stroke ([place])    2010, RMCA with stent  . Toe fracture 11/2015   R toe  . Ulcer 1970s    Past Surgical History:  Procedure Laterality Date  . COLONOSCOPY  03/10/2011   Procedure: COLONOSCOPY;  Surgeon: Rogene Houston, MD;  Location: [facility];  Service:  Endoscopy;  Laterality: N/A;  . COLONOSCOPY N/A 08/24/2016   Procedure: COLONOSCOPY;  Surgeon: Rogene Houston, MD;  Location: [facility];  Service: Endoscopy;  Laterality: N/A;  930  . DILATION AND CURETTAGE OF UTERUS     years ago  . OVARIAN CYST REMOVAL     at least 10 years ago  . POLYPECTOMY  08/24/2016   Procedure: POLYPECTOMY;  Surgeon: Rogene Houston, MD;  Location: [facility];  Service: Endoscopy;;  ascending colon; hepatic flexure  . TONSILLECTOMY     childhood    Allergies  Allergen Reactions  . Amlodipine     Pedal edema  . Niaspan [Niacin Er] Rash    Current Outpatient Medications on File Prior to Visit  Medication Sig Dispense Refill  . ALPRAZolam (XANAX) 0.5 MG tablet TAKE 1/2 TABLET BY MOUTH EVERY 6 HOURS AS NEEDED FOR ANXIETY AND TAKE 1/2 TO 1 TABLET AT BEDTIME AS NEEDED FOR SLEEP 45 tablet 2  . carboxymethylcellulose (REFRESH PLUS) 0.5 % SOLN Place 1 drop into both eyes daily as needed (dry eyes).    . clobetasol ointment (TEMOVATE) 5.57 % Apply 1 application topically 2 (two) times daily as needed.    Marland Kitchen estradiol (ESTRACE) 0.1 MG/GM vaginal cream Place 1 Applicatorful vaginally once a week.    . fish oil-omega-3 fatty acids 1000 MG capsule Take 2 g by mouth every morning.     Marland Kitchen  hydrochlorothiazide (HYDRODIURIL) 12.5 MG tablet Take 1 tablet (12.5 mg total) by mouth daily. Start 04/15/18 30 tablet 3  . Iron-FA-B Cmp-C-Biot-Probiotic (FUSION PLUS) CAPS Take 1 capsule by mouth every morning.   3  . loratadine (CLARITIN) 10 MG tablet Take 1 tablet (10 mg total) by mouth daily. (Patient taking differently: Take 10 mg by mouth at bedtime. ) 30 tablet 5  . OVER THE COUNTER MEDICATION Take 1 capsule by mouth every morning. [place]    . pantoprazole (PROTONIX) 40 MG tablet TAKE ONE TABLET BY MOUTH DAILY. 30 tablet 5  . potassium chloride (K-DUR,KLOR-CON) 10 MEQ tablet TAKE ONE TABLET BY MOUTH TWICE DAILY 60 tablet 5  . Red Yeast Rice 600 MG CAPS Take 1,200 mg by  mouth every morning.     . Rivaroxaban (XARELTO) 15 MG TABS tablet Take 1 tablet (15 mg total) by mouth daily with supper. 28 tablet 0  . sodium chloride (OCEAN) 0.65 % nasal spray Place 1 spray into the nose as needed for congestion.     . [DISCONTINUED] potassium chloride (K-DUR) 10 MEQ tablet Take 1 tablet (10 mEq total) by mouth 2 (two) times daily. 60 tablet 4   No current facility-administered medications on file prior to visit.         Objective:   Physical Exam Blood pressure (!) 180/80, pulse 64, temperature 98 F (36.7 C), height 5' 7.5" (1.715 m), weight 136 lb 9.6 oz (62 kg). Alert and oriented. Skin warm and dry. Oral mucosa is moist.   . Sclera anicteric, conjunctivae is pink. Thyroid not enlarged. No cervical lymphadenopathy. Lungs clear. Heart regular rate and rhythm.  Abdomen is soft. Bowel sounds are positive. No hepatomegaly. No abdominal masses felt. No tenderness.  No edema to lower extremities.       Assessment & Plan:  Nausea, GERD. EGD. Weight loss. The risks of bleeding, perforation and infection were reviewed with patient.

## 2018-04-23 NOTE — Telephone Encounter (Signed)
Pt dropped off urine sample today. Urine was spun and ready for doctor to look at. Urine also sent to lab for urine culture.   Results for orders placed or performed in visit on 04/23/18  POCT urinalysis dipstick  Result Value Ref Range   Color, UA     Clarity, UA     Glucose, UA     Bilirubin, UA     Ketones, UA     Spec Grav, UA 1.010 1.010 - 1.025   Blood, UA     pH, UA 6.0 5.0 - 8.0   Protein, UA     Urobilinogen, UA     Nitrite, UA     Leukocytes, UA     Appearance     Odor

## 2018-04-23 NOTE — Telephone Encounter (Signed)
Spoke with Becky Baird from Dr. Laural Golden office who states the pt is scheduled for Endoscopy on 04/25/18 and needing to hold he Xarelto. Informed of clearance protocol and provided fax number.

## 2018-04-23 NOTE — Telephone Encounter (Signed)
OK to hold Xarelto x 48 hours.

## 2018-04-23 NOTE — Telephone Encounter (Addendum)
Pt takes Xarelto for afib with CHADS2VASc score of 6 (age x 2, sex, HTN, TIA/stroke). CrCl is 77mL/min, pt appropriately on reduced dose Xarelto. Typically recommend holding DOAC for 24 hours prior to endoscopy with history of stroke, however pt does have reduced renal function so clearance will likely be slower. Will route to [name] to see if he is comfortable with pt holding Xarelto for 48 hours prior to procedure. Per last OV note, stroke was not felt to be embolic in nature.  Of note, [name] did clear pt to hold Xarelto for 3 days prior to [facility] in May of 2019, so she should be ok to hold for 2 days prior to this procedure.

## 2018-04-23 NOTE — Telephone Encounter (Signed)
Left message to call.

## 2018-04-23 NOTE — Telephone Encounter (Signed)
   Primary Cardiologist: Minus Breeding, MD  Chart reviewed as part of pre-operative protocol coverage. Given past medical history and time since last visit, based on ACC/AHA guidelines, Becky Baird would be at acceptable risk for the planned procedure without further cardiovascular testing.   Will ask pharmacy to review Xarelto. She need to hold starting today.    [place], [place] 04/23/2018, 2:42 PM

## 2018-04-23 NOTE — Telephone Encounter (Signed)
   [facility] Pre-operative Risk Assessment    Request for surgical clearance:  1. What type of surgery is being performed? Endoscopy   2. When is this surgery scheduled? 04/26/18   3. What type of clearance is required (medical clearance vs. Pharmacy clearance to hold med vs. Both)? Medical  4. Are there any medications that need to be held prior to surgery and how long?Xarelto x 2 days   5. Practice name and name of physician performing surgery? [facility]   6. What is your office phone number  [phone]   7.   What is your office fax number [place]  8.   Anesthesia type (None, local, MAC, general) ? Unknown   Meryl Crutch 04/23/2018, 2:25 PM  _________________________________________________________________   (provider comments below)

## 2018-04-24 DIAGNOSIS — B961 Klebsiella pneumoniae [K. pneumoniae] as the cause of diseases classified elsewhere: Secondary | ICD-10-CM | POA: Diagnosis not present

## 2018-04-24 DIAGNOSIS — R300 Dysuria: Secondary | ICD-10-CM | POA: Diagnosis not present

## 2018-04-24 DIAGNOSIS — N390 Urinary tract infection, site not specified: Secondary | ICD-10-CM | POA: Diagnosis not present

## 2018-04-24 NOTE — Telephone Encounter (Signed)
Awaiting urine culture results.

## 2018-04-25 ENCOUNTER — Encounter (HOSPITAL_COMMUNITY): Payer: Self-pay | Admitting: *Deleted

## 2018-04-25 ENCOUNTER — Telehealth (INDEPENDENT_AMBULATORY_CARE_PROVIDER_SITE_OTHER): Payer: Medicare Other | Admitting: Family Medicine

## 2018-04-25 ENCOUNTER — Encounter (HOSPITAL_COMMUNITY)
Admission: RE | Disposition: A | Discharge status: Home / Self Care | Payer: Self-pay | Source: Ambulatory Visit | Attending: Internal Medicine

## 2018-04-25 ENCOUNTER — Other Ambulatory Visit: Payer: Self-pay

## 2018-04-25 ENCOUNTER — Other Ambulatory Visit: Payer: Self-pay | Admitting: *Deleted

## 2018-04-25 ENCOUNTER — Ambulatory Visit (HOSPITAL_COMMUNITY)
Admission: RE | Admit: 2018-04-25 | Discharge: 2018-04-25 | Disposition: A | Discharge status: Home / Self Care | Payer: Medicare Other | Source: Ambulatory Visit | Attending: Internal Medicine | Admitting: Internal Medicine

## 2018-04-25 DIAGNOSIS — I10 Essential (primary) hypertension: Secondary | ICD-10-CM | POA: Diagnosis not present

## 2018-04-25 DIAGNOSIS — K317 Polyp of stomach and duodenum: Secondary | ICD-10-CM | POA: Diagnosis not present

## 2018-04-25 DIAGNOSIS — Z7901 Long term (current) use of anticoagulants: Secondary | ICD-10-CM | POA: Diagnosis not present

## 2018-04-25 DIAGNOSIS — G8929 Other chronic pain: Secondary | ICD-10-CM | POA: Diagnosis not present

## 2018-04-25 DIAGNOSIS — K228 Other specified diseases of esophagus: Secondary | ICD-10-CM | POA: Insufficient documentation

## 2018-04-25 DIAGNOSIS — M549 Dorsalgia, unspecified: Secondary | ICD-10-CM | POA: Insufficient documentation

## 2018-04-25 DIAGNOSIS — Z8673 Personal history of transient ischemic attack (TIA), and cerebral infarction without residual deficits: Secondary | ICD-10-CM | POA: Diagnosis not present

## 2018-04-25 DIAGNOSIS — R300 Dysuria: Secondary | ICD-10-CM

## 2018-04-25 DIAGNOSIS — K219 Gastro-esophageal reflux disease without esophagitis: Secondary | ICD-10-CM | POA: Diagnosis not present

## 2018-04-25 DIAGNOSIS — E785 Hyperlipidemia, unspecified: Secondary | ICD-10-CM | POA: Diagnosis not present

## 2018-04-25 DIAGNOSIS — Z8711 Personal history of peptic ulcer disease: Secondary | ICD-10-CM | POA: Insufficient documentation

## 2018-04-25 DIAGNOSIS — R634 Abnormal weight loss: Secondary | ICD-10-CM | POA: Insufficient documentation

## 2018-04-25 DIAGNOSIS — F419 Anxiety disorder, unspecified: Secondary | ICD-10-CM | POA: Diagnosis not present

## 2018-04-25 DIAGNOSIS — R110 Nausea: Secondary | ICD-10-CM | POA: Diagnosis not present

## 2018-04-25 HISTORY — PX: ESOPHAGOGASTRODUODENOSCOPY: SHX5428

## 2018-04-25 HISTORY — PX: BIOPSY: SHX5522

## 2018-04-25 LAB — POCT URINALYSIS DIPSTICK
Nitrite, UA: POSITIVE
PH UA: 6 (ref 5.0–8.0)
RBC UA: 250
Spec Grav, UA: 1.015 (ref 1.010–1.025)

## 2018-04-25 LAB — CLOSTRIDIUM DIFFICILE BY PCR: CDIFFPCR: NEGATIVE

## 2018-04-25 SURGERY — EGD (ESOPHAGOGASTRODUODENOSCOPY)
Anesthesia: Moderate Sedation

## 2018-04-25 MED ORDER — SODIUM CHLORIDE 0.9 % IV SOLN
INTRAVENOUS | Status: DC
  Administered 2018-04-25: 13:00:00 via INTRAVENOUS

## 2018-04-25 MED ORDER — MEPERIDINE HCL 50 MG/ML IJ SOLN
INTRAMUSCULAR | Status: DC | PRN
  Administered 2018-04-25 (×2): 25 mg via INTRAVENOUS

## 2018-04-25 MED ORDER — STERILE WATER FOR IRRIGATION IR SOLN
Status: DC | PRN
  Administered 2018-04-25: 15:00:00

## 2018-04-25 MED ORDER — MIDAZOLAM HCL 5 MG/5ML IJ SOLN
INTRAMUSCULAR | Status: DC | PRN
  Administered 2018-04-25: 1 mg via INTRAVENOUS
  Administered 2018-04-25: 2 mg via INTRAVENOUS

## 2018-04-25 MED ORDER — MEPERIDINE HCL 50 MG/ML IJ SOLN
INTRAMUSCULAR | Status: AC
  Filled 2018-04-25: qty 1

## 2018-04-25 MED ORDER — LIDOCAINE VISCOUS HCL 2 % MT SOLN
OROMUCOSAL | Status: AC
  Filled 2018-04-25: qty 15

## 2018-04-25 MED ORDER — MIDAZOLAM HCL 5 MG/5ML IJ SOLN
INTRAMUSCULAR | Status: AC
  Filled 2018-04-25: qty 10

## 2018-04-25 MED ORDER — LIDOCAINE VISCOUS HCL 2 % MT SOLN
OROMUCOSAL | Status: DC | PRN
  Administered 2018-04-25: 4 mL via OROMUCOSAL

## 2018-04-25 MED ORDER — CEFDINIR 300 MG PO CAPS: 300.0000 mg | ORAL_CAPSULE | Freq: Two times a day (BID) | ORAL | 0 refills | Status: DC

## 2018-04-25 MED ORDER — ONDANSETRON HCL 4 MG PO TABS: 4.0000 mg | ORAL_TABLET | Freq: Two times a day (BID) | ORAL | 1 refills | Status: DC | PRN

## 2018-04-25 NOTE — Telephone Encounter (Signed)
Pt's daughter did drop off urine in a sterile cup. Dip put in epic and urine culture sent to lab. Pt is having burning with urination and had night sweats last week.

## 2018-04-25 NOTE — H&P (Signed)
Becky Baird is an 85 y.o. female.   Chief Complaint: Patient is here for diagnostic EGD. HPI: Patient is 84 year old Caucasian female with multiple medical problems who has chronic GERD maintained on PPI as well as remote history of peptic ulcer disease presented with 3 months history of intermittent nausea anorexia and 20 pound weight loss.  States heartburn is well controlled with therapy but she has sporadic regurgitation.  She takes pantoprazole at night.  Her stools are black black because she is on iron.  She denies rectal bleeding.  She has epigastric pain at times when she swallows her medications.  She was treated for UTI few weeks ago and urine analysis from yesterday is abnormal and antibiotic has been called by her physician.  She denies dysphagia.  Past Medical History:  Diagnosis Date  . Anxiety   . Chronic back pain   . Hyperlipidemia   . Hypertension   . Memory loss   . Mild cognitive impairment, so stated   . Occlusion and stenosis of carotid artery with cerebral infarction    Bilateral less than 50% carotid stenosis.  . Persistent atrial fibrillation 06/23/2016  . Renal cyst   . Sciatica   . Stroke ([place])    2010, RMCA with stent  . Toe fracture 11/2015   R toe  . Ulcer 1970s    Past Surgical History:  Procedure Laterality Date  . COLONOSCOPY  03/10/2011   Procedure: COLONOSCOPY;  Surgeon: Rogene Houston, MD;  [facility];  Service: Endoscopy;  Laterality: N/A;  . COLONOSCOPY N/A 08/24/2016   Procedure: COLONOSCOPY;  Surgeon: Rogene Houston, MD;  Location: [facility];  Service: Endoscopy;  Laterality: N/A;  930  . DILATION AND CURETTAGE OF UTERUS     years ago  . OVARIAN CYST REMOVAL     at least 10 years ago  . POLYPECTOMY  08/24/2016   Procedure: POLYPECTOMY;  Surgeon: Rogene Houston, MD;  Location: [facility];  Service: Endoscopy;;  ascending colon; hepatic flexure  . TONSILLECTOMY     childhood    Family History  Problem Relation Age  of Onset  . Stroke Mother   . Heart failure Father   . Diabetes Brother    Social History:  reports that she has never smoked. She has never used smokeless tobacco. She reports that she does not drink alcohol or use drugs.  Allergies:  Allergies  Allergen Reactions  . Amlodipine     Pedal edema  . Niaspan [Niacin Er] Rash    Medications Prior to Admission  Medication Sig Dispense Refill  . ALPRAZolam (XANAX) 0.5 MG tablet TAKE 1/2 TABLET BY MOUTH EVERY 6 HOURS AS NEEDED FOR ANXIETY AND TAKE 1/2 TO 1 TABLET AT BEDTIME AS NEEDED FOR SLEEP 45 tablet 2  . carboxymethylcellulose (REFRESH PLUS) 0.5 % SOLN Place 1 drop into both eyes daily as needed (dry eyes).    . clobetasol ointment (TEMOVATE) 3.30 % Apply 1 application topically 2 (two) times daily as needed.    Marland Kitchen estradiol (ESTRACE) 0.1 MG/GM vaginal cream Place 1 Applicatorful vaginally once a week.    . fish oil-omega-3 fatty acids 1000 MG capsule Take 2 g by mouth every morning.     . hydrochlorothiazide (HYDRODIURIL) 12.5 MG tablet Take 1 tablet (12.5 mg total) by mouth daily. Start 04/15/18 30 tablet 3  . Iron-FA-B Cmp-C-Biot-Probiotic (FUSION PLUS) CAPS Take 1 capsule by mouth every morning.   3  . loratadine (CLARITIN) 10  MG tablet Take 1 tablet (10 mg total) by mouth daily. (Patient taking differently: Take 10 mg by mouth at bedtime. ) 30 tablet 5  . OVER THE COUNTER MEDICATION Take 1 capsule by mouth every morning. [place]    . pantoprazole (PROTONIX) 40 MG tablet TAKE ONE TABLET BY MOUTH DAILY. 30 tablet 5  . potassium chloride (K-DUR,KLOR-CON) 10 MEQ tablet TAKE ONE TABLET BY MOUTH TWICE DAILY 60 tablet 5  . Red Yeast Rice 600 MG CAPS Take 1,200 mg by mouth every morning.     . sodium chloride (OCEAN) 0.65 % nasal spray Place 1 spray into the nose as needed for congestion.     . Rivaroxaban (XARELTO) 15 MG TABS tablet Take 1 tablet (15 mg total) by mouth daily with supper. 28 tablet 0    Results for orders placed or  performed in visit on 04/25/18 (from the past 48 hour(s))  POCT urinalysis dipstick     Status: Abnormal   Collection Time: 04/25/18 12:02 PM  Result Value Ref Range   Color, UA     Clarity, UA     Glucose, UA     Bilirubin, UA     Ketones, UA     Spec Grav, UA 1.015 1.010 - 1.025   Blood, UA 250    pH, UA 6.0 5.0 - 8.0   Protein, UA     Urobilinogen, UA     Nitrite, UA positive    Leukocytes, UA Moderate (2+) (A) Negative   Appearance     Odor     No results found.  ROS  Blood pressure (!) 167/81, pulse 67, temperature 98.4 F (36.9 C), temperature source Oral, resp. rate 12, height 5' 7.5" (1.715 m), weight 62 kg, SpO2 100 %. Physical Exam  Constitutional: She appears well-developed and well-nourished.  HENT:  Mouth/Throat: Oropharynx is clear and moist.  Eyes: Conjunctivae are normal. No scleral icterus.  Neck: No thyromegaly present.  Cardiovascular: Normal rate, regular rhythm and normal heart sounds.  No murmur heard. Respiratory: Effort normal and breath sounds normal.  GI:  Abdomen is symmetrical and soft with mild tenderness at epigastrium and below the left costal margin.  No organomegaly or masses.  Musculoskeletal: She exhibits no edema.  Neurological: She is alert.  Skin: Skin is warm and dry.     Assessment/Plan Nausea anorexia and weight loss. Chronic GERD and remote history of peptic ulcer disease. Diagnostic EGD  Hildred Laser, MD 04/25/2018, 2:31 PM

## 2018-04-25 NOTE — Telephone Encounter (Signed)
Discussed with pt's daughter [name]. She states she will drop off before 12. Await sample

## 2018-04-25 NOTE — Telephone Encounter (Signed)
She may provide another urine-to be looked at It is best to do that before staff is gone at lunch

## 2018-04-25 NOTE — Telephone Encounter (Signed)
Please see result note thank you 

## 2018-04-25 NOTE — Op Note (Signed)
[facility] Patient Name: Becky Baird Procedure Date: 04/25/2018 2:12 PM MRN: [id number] Date of Birth: 1934/01/22 Attending MD: Hildred Laser , MD CSN: [id number] Age: 84 Admit Type: Outpatient Procedure:                Upper GI endoscopy Indications:              Nausea, Weight loss Providers:                Hildred Laser, MD, Otis Peak B. Sharon Seller, RN, Nelma Rothman, Technician Referring MD:             Elayne Snare. Wolfgang Phoenix, MD Medicines:                Lidocaine spray, Meperidine 50 mg IV, Midazolam 3                            mg IV Complications:            No immediate complications. Estimated Blood Loss:     Estimated blood loss was minimal. Procedure:                Pre-Anesthesia Assessment:                           - Prior to the procedure, a History and Physical                            was performed, and patient medications and                            allergies were reviewed. The patient's tolerance of                            previous anesthesia was also reviewed. The risks                            and benefits of the procedure and the sedation                            options and risks were discussed with the patient.                            All questions were answered, and informed consent                            was obtained. Prior Anticoagulants: The patient                            last took Xarelto (rivaroxaban) 3 days prior to the                            procedure. ASA Grade Assessment: III - A patient  with severe systemic disease. After reviewing the                            risks and benefits, the patient was deemed in                            satisfactory condition to undergo the procedure.                           After obtaining informed consent, the endoscope was                            passed under direct vision. Throughout the                            procedure, the  patient's blood pressure, pulse, and                            oxygen saturations were monitored continuously. The                            GIF-H190 ([id number]) scope was introduced through the                            mouth, and advanced to the second part of duodenum.                            The upper GI endoscopy was accomplished without                            difficulty. The patient tolerated the procedure                            well. Scope In: 2:43:02 PM Scope Out: 2:52:18 PM Total Procedure Duration: 0 hours 9 minutes 16 seconds  Findings:      The examined esophagus was normal.      The Z-line was irregular and was found 40 cm from the incisors.      A few small sessile polyps with no stigmata of recent bleeding were       found in the gastric fundus and in the gastric body. Biopsies were taken       with a cold forceps for histology. The pathology specimen was placed       into Bottle Number 1.      The exam of the stomach was otherwise normal.      The duodenal bulb and second portion of the duodenum were normal. Impression:               - Normal esophagus.                           - Z-line irregular, 40 cm from the incisors.                           - A few gastric polyps. Biopsied.                           -  Normal duodenal bulb and second portion of the                            duodenum. Moderate Sedation:      Moderate (conscious) sedation was administered by the endoscopy nurse       and supervised by the endoscopist. The following parameters were       monitored: oxygen saturation, heart rate, blood pressure, CO2       capnography and response to care. Total physician intraservice time was       15 minutes. Recommendation:           - Patient has a contact number available for                            emergencies. The signs and symptoms of potential                            delayed complications were discussed with the                             patient. Return to normal activities tomorrow.                            Written discharge instructions were provided to the                            patient.                           - Resume previous diet.                           - Continue present medications.                           - Resume Xarelto (rivaroxaban) at prior dose                            tomorrow.                           - Ondansetron 4 mg po bid prn.                           - Await pathology results. Procedure Code(s):        --- Professional ---                           [phone], Esophagogastroduodenoscopy, flexible,                            transoral; with biopsy, single or multiple                           G0500, Moderate sedation services provided by the  same physician or other qualified health care                            professional performing a gastrointestinal                            endoscopic service that sedation supports,                            requiring the presence of an independent trained                            observer to assist in the monitoring of the                            patient's level of consciousness and physiological                            status; initial 15 minutes of intra-service time;                            patient age 45 years or older (additional time may                            be reported with [phone], as appropriate) Diagnosis Code(s):        --- Professional ---                           K22.8, Other specified diseases of esophagus                           K31.7, Polyp of stomach and duodenum                           R11.0, Nausea                           R63.4, Abnormal weight loss CPT copyright 2017 American Medical Association. All rights reserved. The codes documented in this report are preliminary and upon coder review may  be revised to meet current compliance requirements. Hildred Laser, MD Hildred Laser, MD 04/25/2018 3:08:20 PM This report has been signed electronically. Number of Addenda: 0

## 2018-04-25 NOTE — Telephone Encounter (Signed)
Discussed with pt's daughter

## 2018-04-25 NOTE — Discharge Instructions (Signed)
Resume Xarelto on 04/26/2018. Pantoprazole 30 minutes before evening meal daily not at bedtime. Resume other medications as before. Ondansetron 4 mg by mouth twice daily as needed for nausea. Resume usual diet. No driving for 24 hours. Will call with biopsy results.     Esophagogastroduodenoscopy, Care After Refer to this sheet in the next few weeks. These instructions provide you with information about caring for yourself after your procedure. Your health care provider may also give you more specific instructions. Your treatment has been planned according to current medical practices, but problems sometimes occur. Call your health care provider if you have any problems or questions after your procedure. What can I expect after the procedure? After the procedure, it is common to have:  A sore throat.  Nausea.  Bloating.  Dizziness.  Fatigue.  Follow these instructions at home:  Do not eat or drink anything until the numbing medicine (local anesthetic) has worn off and your gag reflex has returned. You will know that the local anesthetic has worn off when you can swallow comfortably.  Do not drive for 24 hours if you received a medicine to help you relax (sedative).  If your health care provider took a tissue sample for testing during the procedure, make sure to get your test results. This is your responsibility. Ask your health care provider or the department performing the test when your results will be ready.  Keep all follow-up visits as told by your health care provider. This is important. Contact a health care provider if:  You cannot stop coughing.  You are not urinating.  You are urinating less than usual. Get help right away if:  You have trouble swallowing.  You cannot eat or drink.  You have throat or chest pain that gets worse.  You are dizzy or light-headed.  You faint.  You have nausea or vomiting.  You have chills.  You have a fever.  You have  severe abdominal pain.  You have black, tarry, or bloody stools. This information is not intended to replace advice given to you by your health care provider. Make sure you discuss any questions you have with your health care provider. Document Released: 06/27/2012 Document Revised: 12/17/2015 Document Reviewed: 06/04/2015 Elsevier Interactive Patient Education  [employer].

## 2018-04-25 NOTE — Telephone Encounter (Signed)
Pt's daughter [name] calling regarding the urine sample that was dropped off on Monday. Ms. Britt woke up with burning when urinating this morning and is really concerned of another UTI and it getting worse like it did a couple of weeks ago. She has to be at [facility] at 12:15 today to have a EDG with Dr. Laural Golden. They are wanting to know if another urine sample should be dropped off. Please call daughter [name].

## 2018-04-27 DIAGNOSIS — B961 Klebsiella pneumoniae [K. pneumoniae] as the cause of diseases classified elsewhere: Secondary | ICD-10-CM | POA: Diagnosis not present

## 2018-04-27 DIAGNOSIS — N390 Urinary tract infection, site not specified: Secondary | ICD-10-CM | POA: Diagnosis not present

## 2018-04-27 LAB — URINE CULTURE

## 2018-04-28 LAB — URINE CULTURE

## 2018-04-30 ENCOUNTER — Encounter (HOSPITAL_COMMUNITY): Payer: Self-pay | Admitting: Internal Medicine

## 2018-05-01 ENCOUNTER — Other Ambulatory Visit: Payer: Self-pay | Admitting: Cardiology

## 2018-05-01 DIAGNOSIS — N390 Urinary tract infection, site not specified: Secondary | ICD-10-CM | POA: Diagnosis not present

## 2018-05-01 DIAGNOSIS — B961 Klebsiella pneumoniae [K. pneumoniae] as the cause of diseases classified elsewhere: Secondary | ICD-10-CM | POA: Diagnosis not present

## 2018-05-01 MED ORDER — RIVAROXABAN 15 MG PO TABS: 15.0000 mg | ORAL_TABLET | Freq: Every day | ORAL | 0 refills | Status: DC

## 2018-05-01 NOTE — Telephone Encounter (Signed)
Patient calling the office for samples of medication:   1.  What medication and dosage are you requesting samples for? Xarelto  2.  Are you currently out of this medication?  No - 3 pills

## 2018-05-01 NOTE — Telephone Encounter (Signed)
Pt will pick up Xarelto 15 mg samples later today

## 2018-05-03 DIAGNOSIS — N390 Urinary tract infection, site not specified: Secondary | ICD-10-CM | POA: Diagnosis not present

## 2018-05-03 DIAGNOSIS — B961 Klebsiella pneumoniae [K. pneumoniae] as the cause of diseases classified elsewhere: Secondary | ICD-10-CM | POA: Diagnosis not present

## 2018-05-07 ENCOUNTER — Other Ambulatory Visit: Payer: Self-pay | Admitting: Family Medicine

## 2018-05-07 DIAGNOSIS — B9620 Unspecified Escherichia coli [E. coli] as the cause of diseases classified elsewhere: Secondary | ICD-10-CM | POA: Diagnosis not present

## 2018-05-07 DIAGNOSIS — Z1239 Encounter for other screening for malignant neoplasm of breast: Secondary | ICD-10-CM

## 2018-05-07 DIAGNOSIS — B961 Klebsiella pneumoniae [K. pneumoniae] as the cause of diseases classified elsewhere: Secondary | ICD-10-CM | POA: Diagnosis not present

## 2018-05-07 DIAGNOSIS — E860 Dehydration: Secondary | ICD-10-CM | POA: Diagnosis not present

## 2018-05-07 DIAGNOSIS — N390 Urinary tract infection, site not specified: Secondary | ICD-10-CM | POA: Diagnosis not present

## 2018-05-07 DIAGNOSIS — N12 Tubulo-interstitial nephritis, not specified as acute or chronic: Secondary | ICD-10-CM | POA: Diagnosis not present

## 2018-05-08 DIAGNOSIS — N390 Urinary tract infection, site not specified: Secondary | ICD-10-CM | POA: Diagnosis not present

## 2018-05-08 DIAGNOSIS — B961 Klebsiella pneumoniae [K. pneumoniae] as the cause of diseases classified elsewhere: Secondary | ICD-10-CM | POA: Diagnosis not present

## 2018-05-10 DIAGNOSIS — B961 Klebsiella pneumoniae [K. pneumoniae] as the cause of diseases classified elsewhere: Secondary | ICD-10-CM | POA: Diagnosis not present

## 2018-05-10 DIAGNOSIS — N390 Urinary tract infection, site not specified: Secondary | ICD-10-CM | POA: Diagnosis not present

## 2018-05-14 ENCOUNTER — Other Ambulatory Visit: Payer: Self-pay | Admitting: Cardiology

## 2018-05-15 ENCOUNTER — Ambulatory Visit (INDEPENDENT_AMBULATORY_CARE_PROVIDER_SITE_OTHER): Payer: Medicare Other | Admitting: Family Medicine

## 2018-05-15 ENCOUNTER — Encounter: Payer: Self-pay | Admitting: Family Medicine

## 2018-05-15 VITALS — BP 138/84 | Ht 67.7 in | Wt 140.8 lb

## 2018-05-15 DIAGNOSIS — R2681 Unsteadiness on feet: Secondary | ICD-10-CM | POA: Diagnosis not present

## 2018-05-15 DIAGNOSIS — F439 Reaction to severe stress, unspecified: Secondary | ICD-10-CM | POA: Diagnosis not present

## 2018-05-15 DIAGNOSIS — R300 Dysuria: Secondary | ICD-10-CM | POA: Diagnosis not present

## 2018-05-15 DIAGNOSIS — R634 Abnormal weight loss: Secondary | ICD-10-CM

## 2018-05-15 LAB — POCT URINALYSIS DIPSTICK
PH UA: 6 (ref 5.0–8.0)
SPEC GRAV UA: 1.02 (ref 1.010–1.025)

## 2018-05-15 NOTE — Progress Notes (Addendum)
   Subjective:    Patient ID: Becky Baird, female    DOB: 1934-02-27, 84 y.o.   MRN: [id number]  HPI  Patient arrives for a follow up on a recent hospitalization for nausea and weight loss. Patient feeling much better. Patient also would like urine rechecked from a recent UTI.   Patient recently in the hospital for EGD her appetite is now improving she is actually doing better job of eating she is gaining some weight.  Her strength is doing better they are doing physical therapy with her  She had a recent UTI which caused serious troubles she is here today to have her urine rechecked she denies any dysuria urinary frequency or hematuria  Patient has not had any falls she denies being depressed Her family feels she is anxious I asked her to fill out PHQ 9 as well as GAD 7 in this created some tension for the patient and she decided not to fill this out which was fine the patient does not feel that she is stressed out she feels that she is handling things as best she can she does not want to be on any type of antidepressant Review of Systems  Constitutional: Negative for activity change, appetite change and fatigue.  HENT: Negative for congestion and rhinorrhea.   Respiratory: Negative for cough and shortness of breath.   Cardiovascular: Negative for chest pain and leg swelling.  Gastrointestinal: Negative for abdominal pain and diarrhea.  Endocrine: Negative for polydipsia and polyphagia.  Skin: Negative for color change.  Neurological: Negative for dizziness and weakness.  Psychiatric/Behavioral: Negative for behavioral problems and confusion.       Objective:   Physical Exam  Constitutional: She appears well-nourished. No distress.  HENT:  Head: Normocephalic and atraumatic.  Eyes: Right eye exhibits no discharge. Left eye exhibits no discharge.  Neck: No tracheal deviation present.  Cardiovascular: Normal rate, regular rhythm and normal heart sounds.  No murmur  heard. Pulmonary/Chest: Effort normal and breath sounds normal. No respiratory distress.  Musculoskeletal: She exhibits no edema.  Lymphadenopathy:    She has no cervical adenopathy.  Neurological: She is alert. Coordination normal.  Skin: Skin is warm and dry.  Psychiatric: She has a normal mood and affect. Her behavior is normal.  Vitals reviewed.  25 minutes was spent with the patient.  This statement verifies that 25 minutes was indeed spent with the patient.  More than 50% of this visit-total duration of the visit-was spent in counseling and coordination of care. The issues that the patient came in for today as reflected in the diagnosis (s) please refer to documentation for further details.      Assessment & Plan:  Urine looks good no sign of any reoccurring infection I do not recommend a culture at this time  Significant improvement with her diet her weight has come back up she is doing much better  Her walking is better but still at risk of falling recommend she use her cane  She does have some anxiousness but she denies having it severe does not want to be on any medications Follow-up in 2 months

## 2018-05-16 NOTE — Addendum Note (Signed)
Addended by: [name] A on: 05/16/2018 12:25 PM   Modules accepted: Level of Service

## 2018-05-17 DIAGNOSIS — B961 Klebsiella pneumoniae [K. pneumoniae] as the cause of diseases classified elsewhere: Secondary | ICD-10-CM | POA: Diagnosis not present

## 2018-05-17 DIAGNOSIS — N390 Urinary tract infection, site not specified: Secondary | ICD-10-CM | POA: Diagnosis not present

## 2018-05-18 ENCOUNTER — Telehealth: Payer: Self-pay | Admitting: Family Medicine

## 2018-05-18 DIAGNOSIS — Z23 Encounter for immunization: Secondary | ICD-10-CM | POA: Diagnosis not present

## 2018-05-18 NOTE — Telephone Encounter (Signed)
I would recommend pharmacy and get the senior dose

## 2018-05-18 NOTE — Telephone Encounter (Signed)
Pt daughter informed and verbalized understanding.

## 2018-05-18 NOTE — Telephone Encounter (Signed)
Please advise. Thank you

## 2018-05-18 NOTE — Telephone Encounter (Signed)
Pt's daughter Ezequiel Essex would like to know if Dr. Nicki Reaper would like Ms. Mckendree to come here and get her flu shot or go to a pharmacy and get the senior dose. CB# [phone]

## 2018-05-22 DIAGNOSIS — N390 Urinary tract infection, site not specified: Secondary | ICD-10-CM | POA: Diagnosis not present

## 2018-05-22 DIAGNOSIS — B961 Klebsiella pneumoniae [K. pneumoniae] as the cause of diseases classified elsewhere: Secondary | ICD-10-CM | POA: Diagnosis not present

## 2018-06-01 ENCOUNTER — Ambulatory Visit: Payer: Medicare Other | Admitting: Family Medicine

## 2018-06-04 ENCOUNTER — Other Ambulatory Visit: Payer: Self-pay | Admitting: Family Medicine

## 2018-06-15 ENCOUNTER — Telehealth: Payer: Self-pay | Admitting: Family Medicine

## 2018-06-15 NOTE — Telephone Encounter (Signed)
Patient states she got senior flu shot at [facility] and would like it updated on her chart.

## 2018-06-15 NOTE — Telephone Encounter (Signed)
Pt chart up dated.

## 2018-06-18 ENCOUNTER — Encounter (HOSPITAL_COMMUNITY): Payer: Self-pay

## 2018-06-18 ENCOUNTER — Ambulatory Visit (HOSPITAL_COMMUNITY)
Admission: RE | Admit: 2018-06-18 | Discharge: 2018-06-18 | Disposition: A | Discharge status: Home / Self Care | Payer: Medicare Other | Source: Ambulatory Visit | Attending: Family Medicine | Admitting: Family Medicine

## 2018-06-18 DIAGNOSIS — Z1231 Encounter for screening mammogram for malignant neoplasm of breast: Secondary | ICD-10-CM | POA: Insufficient documentation

## 2018-06-18 DIAGNOSIS — Z1239 Encounter for other screening for malignant neoplasm of breast: Secondary | ICD-10-CM | POA: Diagnosis present

## 2018-06-19 ENCOUNTER — Telehealth: Payer: Self-pay | Admitting: Cardiology

## 2018-06-19 MED ORDER — RIVAROXABAN 15 MG PO TABS: 15.0000 mg | ORAL_TABLET | Freq: Every day | ORAL | 0 refills | Status: DC

## 2018-06-19 NOTE — Telephone Encounter (Signed)
Patient notified samples ready for pick up

## 2018-06-19 NOTE — Telephone Encounter (Signed)
Patient calling the office for samples of medication: ° ° °1.  What medication and dosage are you requesting samples for? Xarelto ° °2.  Are you currently out of this medication? No  ° ° ° °

## 2018-07-10 ENCOUNTER — Other Ambulatory Visit: Payer: Self-pay | Admitting: Family Medicine

## 2018-07-12 ENCOUNTER — Telehealth: Payer: Self-pay | Admitting: Cardiology

## 2018-07-12 MED ORDER — RIVAROXABAN 15 MG PO TABS: 15.0000 mg | ORAL_TABLET | Freq: Every day | ORAL | 0 refills | Status: DC

## 2018-07-12 NOTE — Telephone Encounter (Signed)
Patient notified samples ready for pick up

## 2018-07-12 NOTE — Telephone Encounter (Signed)
Patient calling the office for samples of medication: ° ° °1.  What medication and dosage are you requesting samples for? Xarelto ° °2.  Are you currently out of this medication? No  ° ° ° °

## 2018-07-26 ENCOUNTER — Telehealth: Payer: Self-pay | Admitting: Cardiology

## 2018-07-26 NOTE — Telephone Encounter (Signed)
Medication samples have been provided to the patient.  Drug name: Xarelto 15 mg  Qty: 21 LOT: [id number]  Exp.Date: 08/21  Samples left at front desk for patient pick-up. Patient notified.

## 2018-07-26 NOTE — Telephone Encounter (Signed)
New message     Patient calling the office for samples of medication:   1.  What medication and dosage are you requesting samples for?Rivaroxaban (XARELTO) 15 MG TABS tablet  2.  Are you currently out of this medication? 2 weeks left

## 2018-07-30 ENCOUNTER — Ambulatory Visit (INDEPENDENT_AMBULATORY_CARE_PROVIDER_SITE_OTHER): Payer: Medicare Other | Admitting: Family Medicine

## 2018-07-30 ENCOUNTER — Other Ambulatory Visit (HOSPITAL_COMMUNITY): Payer: Self-pay | Admitting: Family Medicine

## 2018-07-30 ENCOUNTER — Encounter: Payer: Self-pay | Admitting: Family Medicine

## 2018-07-30 VITALS — BP 124/76 | Ht 67.7 in | Wt 139.0 lb

## 2018-07-30 DIAGNOSIS — D509 Iron deficiency anemia, unspecified: Secondary | ICD-10-CM

## 2018-07-30 DIAGNOSIS — N289 Disorder of kidney and ureter, unspecified: Secondary | ICD-10-CM

## 2018-07-30 DIAGNOSIS — R5383 Other fatigue: Secondary | ICD-10-CM | POA: Diagnosis not present

## 2018-07-30 DIAGNOSIS — I10 Essential (primary) hypertension: Secondary | ICD-10-CM | POA: Diagnosis not present

## 2018-07-30 DIAGNOSIS — Z780 Asymptomatic menopausal state: Secondary | ICD-10-CM

## 2018-07-30 DIAGNOSIS — I480 Paroxysmal atrial fibrillation: Secondary | ICD-10-CM

## 2018-07-30 MED ORDER — MUPIROCIN 2 % EX OINT: TOPICAL_OINTMENT | CUTANEOUS | 0 refills | Status: DC

## 2018-07-30 NOTE — Progress Notes (Signed)
Subjective:    Patient ID: Becky Baird, female    DOB: 1934/06/10, 85 y.o.   MRN: [id number]  HPI Very nice patient and family here today for follow-up Patient is here today to follow up on anxiety.She states she still does not want to start any medications.  She at times gets stressed out but denies any panic attacks denies any severe anxiousness  She has not had any further Uti's.  She was in the hospital pyelonephritis but these UTIs under good control currently  She wanted to know if she should start B12 she says she gets tired a lot.  We talked at length about how B12 is not routinely tested nonetheless CBC shows significant problems we will look at her CBC a lot of her fatigue I believe is age-related I have encouraged her to stay physically active  She also has a place on her left back leg that she picks at.  This been going on for the past several weeks  Renal insufficiency she does try to drink plenty of water we will look at her kidney functions again  She did have iron deficiency in the past wonders if she needs to keep taking her iron tablets we will check lab work  Fatigue and tiredness most likely related to her age  Does take her blood thinner on a regular basis and does not had any problems with that recently no bleeding issues Review of Systems  Constitutional: Negative for activity change, appetite change and fatigue.  HENT: Negative for congestion and rhinorrhea.   Respiratory: Negative for cough and shortness of breath.   Cardiovascular: Negative for chest pain and leg swelling.  Gastrointestinal: Negative for abdominal pain and diarrhea.  Endocrine: Negative for polydipsia and polyphagia.  Skin: Negative for color change.  Neurological: Negative for dizziness and weakness.  Psychiatric/Behavioral: Negative for behavioral problems and confusion.       Objective:   Physical Exam Vitals signs reviewed.  Constitutional:      General: She is not in acute  distress. HENT:     Head: Normocephalic and atraumatic.  Eyes:     General:        Right eye: No discharge.        Left eye: No discharge.  Neck:     Trachea: No tracheal deviation.  Cardiovascular:     Rate and Rhythm: Normal rate and regular rhythm.     Heart sounds: Normal heart sounds. No murmur.  Pulmonary:     Effort: Pulmonary effort is normal. No respiratory distress.     Breath sounds: Normal breath sounds.  Lymphadenopathy:     Cervical: No cervical adenopathy.  Skin:    General: Skin is warm and dry.  Neurological:     Mental Status: She is alert.     Coordination: Coordination normal.  Psychiatric:        Behavior: Behavior normal.    Excoriated area noted on the back of the left thigh patient was encouraged to leave this alone it is hard to know at this point time if it is a problematic area or not once it heals up I will be happy to read look at it or they can see dermatology family will let us know I recommend Bactroban ointment on this area  I did watch the patient walk she is slow with her walking but steady she uses a cane but does not use it properly I tried to show her some tips to using  it better       Assessment & Plan:  HTN- Patient was seen today as part of a visit regarding hypertension. The importance of healthy diet and regular physical activity was discussed. The importance of compliance with medications discussed.  Ideal goal is to keep blood pressure low elevated levels certainly below 437/35 when possible.  The patient was counseled that keeping blood pressure under control lessen his risk of complications.  The importance of regular follow-ups was discussed with the patient.  Low-salt diet such as DASH recommended.  Regular physical activity was recommended as well.  Patient was advised to keep regular follow-ups.  Renal insufficiency continue current measures drink plenty of water check lab work await results  History of iron deficient  anemia check lab work check ferritin may be able to stop taking the iron tablets  Bone density test necessary  Atrial for wonder good control currently tolerating the blood thinner follow-up in 3 to 4 months

## 2018-07-31 LAB — BASIC METABOLIC PANEL
BUN / CREAT RATIO: 17 (ref 12–28)
BUN: 20 mg/dL (ref 8–27)
CO2: 26 mmol/L (ref 20–29)
CREATININE: 1.15 mg/dL — AB (ref 0.57–1.00)
Calcium: 10 mg/dL (ref 8.7–10.3)
Chloride: 96 mmol/L (ref 96–106)
GFR calc Af Amer: 51 mL/min/{1.73_m2} — ABNORMAL LOW (ref >59)
GFR calc non Af Amer: 44 mL/min/{1.73_m2} — ABNORMAL LOW (ref >59)
GLUCOSE: 90 mg/dL (ref 65–99)
POTASSIUM: 3.7 mmol/L (ref 3.5–5.2)
SODIUM: 134 mmol/L (ref 134–144)

## 2018-07-31 LAB — CBC WITH DIFFERENTIAL/PLATELET
Basophils Absolute: 0.1 10*3/uL (ref 0.0–0.2)
Basos: 1 %
EOS (ABSOLUTE): 0.1 10*3/uL (ref 0.0–0.4)
EOS: 2 %
HEMATOCRIT: 36.9 % (ref 34.0–46.6)
Hemoglobin: 12.7 g/dL (ref 11.1–15.9)
Immature Grans (Abs): 0 10*3/uL (ref 0.0–0.1)
Immature Granulocytes: 0 %
LYMPHS ABS: 1.6 10*3/uL (ref 0.7–3.1)
Lymphs: 30 %
MCH: 31.8 pg (ref 26.6–33.0)
MCHC: 34.4 g/dL (ref 31.5–35.7)
MCV: 93 fL (ref 79–97)
MONOCYTES: 10 %
Monocytes Absolute: 0.5 10*3/uL (ref 0.1–0.9)
NEUTROS ABS: 3.1 10*3/uL (ref 1.4–7.0)
Neutrophils: 57 %
PLATELETS: 246 10*3/uL (ref 150–450)
RBC: 3.99 x10E6/uL (ref 3.77–5.28)
RDW: 12.1 % (ref 11.7–15.4)
WBC: 5.3 10*3/uL (ref 3.4–10.8)

## 2018-07-31 LAB — TSH: TSH: 2.52 u[IU]/mL (ref 0.450–4.500)

## 2018-08-06 ENCOUNTER — Other Ambulatory Visit: Payer: Self-pay | Admitting: Family Medicine

## 2018-08-06 NOTE — Telephone Encounter (Signed)
May have this +2 refills 

## 2018-08-14 ENCOUNTER — Other Ambulatory Visit (HOSPITAL_COMMUNITY)
Admission: RE | Admit: 2018-08-14 | Discharge: 2018-08-14 | Disposition: A | Discharge status: Home / Self Care | Payer: Medicare Other | Source: Ambulatory Visit | Attending: Urology | Admitting: Urology

## 2018-08-14 ENCOUNTER — Ambulatory Visit (INDEPENDENT_AMBULATORY_CARE_PROVIDER_SITE_OTHER): Payer: Medicare Other | Admitting: Urology

## 2018-08-14 DIAGNOSIS — N952 Postmenopausal atrophic vaginitis: Secondary | ICD-10-CM | POA: Insufficient documentation

## 2018-08-14 DIAGNOSIS — R351 Nocturia: Secondary | ICD-10-CM | POA: Diagnosis not present

## 2018-08-17 LAB — URINE CULTURE: Culture: 10000 — AB

## 2018-08-22 DIAGNOSIS — L900 Lichen sclerosus et atrophicus: Secondary | ICD-10-CM | POA: Diagnosis not present

## 2018-08-22 DIAGNOSIS — N814 Uterovaginal prolapse, unspecified: Secondary | ICD-10-CM | POA: Diagnosis not present

## 2018-09-03 ENCOUNTER — Ambulatory Visit (HOSPITAL_COMMUNITY)
Admission: RE | Admit: 2018-09-03 | Discharge: 2018-09-03 | Disposition: A | Discharge status: Home / Self Care | Payer: Medicare Other | Source: Ambulatory Visit | Attending: Family Medicine | Admitting: Family Medicine

## 2018-09-03 DIAGNOSIS — Z780 Asymptomatic menopausal state: Secondary | ICD-10-CM | POA: Insufficient documentation

## 2018-09-03 DIAGNOSIS — M810 Age-related osteoporosis without current pathological fracture: Secondary | ICD-10-CM | POA: Diagnosis not present

## 2018-09-17 ENCOUNTER — Telehealth: Payer: Self-pay | Admitting: Cardiology

## 2018-09-17 MED ORDER — RIVAROXABAN 15 MG PO TABS: 15.0000 mg | ORAL_TABLET | Freq: Every day | ORAL | 0 refills | Status: DC

## 2018-09-17 NOTE — Telephone Encounter (Signed)
Called asking for Xarelto samples

## 2018-09-17 NOTE — Telephone Encounter (Signed)
Patient notified samples ready for pick up

## 2018-10-10 ENCOUNTER — Telehealth: Payer: Self-pay | Admitting: Cardiology

## 2018-10-10 MED ORDER — RIVAROXABAN 15 MG PO TABS: 15.0000 mg | ORAL_TABLET | Freq: Every day | ORAL | 0 refills | Status: DC

## 2018-10-10 NOTE — Telephone Encounter (Signed)
Pt will come by and pick up samples of Xarelto 15 mg - pt will also make appt for May with [name]

## 2018-10-10 NOTE — Telephone Encounter (Signed)
Patient calling the office for samples of medication:        1.  What medication and dosage are you requesting samples for? (XARELTO) 15 MG TABS  2.  Are you currently out of this medication?

## 2018-10-24 ENCOUNTER — Ambulatory Visit: Payer: Medicare Other | Admitting: Family Medicine

## 2018-10-29 ENCOUNTER — Other Ambulatory Visit: Payer: Self-pay

## 2018-10-29 MED ORDER — HYDROCHLOROTHIAZIDE 12.5 MG PO TABS: 12.5000 mg | ORAL_TABLET | Freq: Every day | ORAL | 3 refills | Status: DC

## 2018-11-21 ENCOUNTER — Telehealth: Payer: Self-pay | Admitting: Cardiology

## 2018-11-21 NOTE — Telephone Encounter (Signed)
The patient verbally consented for a telehealth phone visit with [facility] and understands that his/her insurance company will be billed for the encounter. °  °Asked to have weight & vitals ready prior to telephone call.   °

## 2018-12-03 ENCOUNTER — Telehealth: Payer: Medicare Other | Admitting: Cardiology

## 2018-12-03 ENCOUNTER — Telehealth: Payer: Self-pay | Admitting: Cardiology

## 2018-12-03 NOTE — Telephone Encounter (Signed)
Patient calling the office for samples of medication:   1.  What medication and dosage are you requesting samples for? XALRETO  2.  Are you currently out of this medication?

## 2018-12-03 NOTE — Progress Notes (Signed)
Virtual Visit via Telephone Note   This visit type was conducted due to national recommendations for restrictions regarding the COVID-19 Pandemic (e.g. social distancing) in an effort to limit this patient's exposure and mitigate transmission in our community.  Due to her co-morbid illnesses, this patient is at least at moderate risk for complications without adequate follow up.  This format is felt to be most appropriate for this patient at this time.  The patient did not have access to video technology/had technical difficulties with video requiring transitioning to audio format only (telephone).  All issues noted in this document were discussed and addressed.  No physical exam could be performed with this format.  Please refer to the patient's chart for her  consent to telehealth for [facility].   Date:  12/04/2018   ID:  Becky Baird, DOB 1934/02/15, MRN [id number]  Patient Location: Home Provider Location: Home  PCP:  Kathyrn Drown, MD  Cardiologist:  Minus Breeding, MD  Electrophysiologist:  None   Evaluation Performed:  Follow-Up Visit  Chief Complaint:  Atrial fib  History of Present Illness:    Becky Baird is a 85 y.o. female who presents for follow up of PAF and previous stroke.  Since I last saw her she has done well.  The patient denies any new symptoms such as chest discomfort, neck or arm discomfort. There has been no new shortness of breath, PND or orthopnea. There have been no reported palpitations, presyncope or syncope.     The patient does not have symptoms concerning for COVID-19 infection (fever, chills, cough, or new shortness of breath).    Past Medical History:  Diagnosis Date  . Anxiety   . Chronic back pain   . Hyperlipidemia   . Hypertension   . Memory loss   . Mild cognitive impairment, so stated   . Occlusion and stenosis of carotid artery with cerebral infarction    Bilateral less than 50% carotid stenosis.  . Persistent atrial  fibrillation 06/23/2016  . Renal cyst   . Sciatica   . Stroke ([place])    2010, RMCA with stent  . Toe fracture 11/2015   R toe  . Ulcer 1970s   Past Surgical History:  Procedure Laterality Date  . BIOPSY  04/25/2018   Procedure: BIOPSY;  Surgeon: Rogene Houston, MD;  [facility];  Service: Endoscopy;;  gastric polyps  . COLONOSCOPY  03/10/2011   Procedure: COLONOSCOPY;  Surgeon: Rogene Houston, MD;  [facility];  Service: Endoscopy;  Laterality: N/A;  . COLONOSCOPY N/A 08/24/2016   Procedure: COLONOSCOPY;  Surgeon: Rogene Houston, MD;  [facility];  Service: Endoscopy;  Laterality: N/A;  930  . DILATION AND CURETTAGE OF UTERUS     years ago  . ESOPHAGOGASTRODUODENOSCOPY N/A 04/25/2018   Procedure: ESOPHAGOGASTRODUODENOSCOPY (EGD);  Surgeon: Rogene Houston, MD;  [facility];  Service: Endoscopy;  Laterality: N/A;  1:15  . OVARIAN CYST REMOVAL     at least 10 years ago  . POLYPECTOMY  08/24/2016   Procedure: POLYPECTOMY;  Surgeon: Rogene Houston, MD;  [facility];  Service: Endoscopy;;  ascending colon; hepatic flexure  . TONSILLECTOMY     childhood     Current Meds  Medication Sig  . ALPRAZolam (XANAX) 0.5 MG tablet TAKE 1/2 TABLET BY MOUTH EVERY 6 HOURS AS NEEDED FOR ANXIETY AND TAKE 1/2 TO 1 TABLET AT BEDTIME AS NEEDED FOR SLEEP  .  clobetasol ointment (TEMOVATE) 9.35 % Apply 1 application topically 2 (two) times daily as needed.  Marland Kitchen estradiol (ESTRACE) 0.1 MG/GM vaginal cream Place 1 Applicatorful vaginally once a week.  . fish oil-omega-3 fatty acids 1000 MG capsule Take 2 g by mouth 2 (two) times a day.   . hydrochlorothiazide (HYDRODIURIL) 12.5 MG tablet Take 1 tablet (12.5 mg total) by mouth daily.  . Iron-FA-B Cmp-C-Biot-Probiotic (FUSION PLUS) CAPS Take 1 capsule by mouth every morning.   Marland Kitchen Ketotifen Fumarate (ALAWAY OP) Apply 1 drop to eye daily as needed.  . loratadine (CLARITIN) 10 MG tablet Take 1 tablet (10  mg total) by mouth daily. (Patient taking differently: Take 10 mg by mouth at bedtime. )  . ondansetron (ZOFRAN) 4 MG tablet Take 1 tablet (4 mg total) by mouth 2 (two) times daily as needed for nausea or vomiting.  Marland Kitchen OVER THE COUNTER MEDICATION Take 1 capsule by mouth every morning. [place]  . pantoprazole (PROTONIX) 40 MG tablet TAKE ONE TABLET BY MOUTH DAILY.  Marland Kitchen potassium chloride (K-DUR,KLOR-CON) 10 MEQ tablet TAKE ONE TABLET BY MOUTH TWICE DAILY  . Red Yeast Rice 600 MG CAPS Take 1,200 mg by mouth 2 (two) times a day.   . Rivaroxaban (XARELTO) 15 MG TABS tablet Take 1 tablet (15 mg total) by mouth daily with supper.  . sodium chloride (OCEAN) 0.65 % nasal spray Place 1 spray into the nose as needed for congestion.      Allergies:   Amlodipine and Niaspan [niacin er]   Social History   Tobacco Use  . Smoking status: Never Smoker  . Smokeless tobacco: Never Used  Substance Use Topics  . Alcohol use: No    Alcohol/week: 0.0 standard drinks  . Drug use: No     Family Hx: The patient's family history includes Diabetes in her brother; Heart failure in her father; Stroke in her mother.  ROS:   Please see the history of present illness.     All other systems reviewed and are negative.   Prior CV studies:   The following studies were reviewed today:  Labs  Labs/Other Tests and Data Reviewed:    EKG:  04/07/18   NSR, rate 60, no acute ST T wave changes.  Early repolarization  Recent Labs: 04/07/2018: ALT 20; Magnesium 1.8 07/30/2018: BUN 20; Creatinine, Ser 1.15; Hemoglobin 12.7; Platelets 246; Potassium 3.7; Sodium 134; TSH 2.520   Recent Lipid Panel Lab Results  Component Value Date/Time   CHOL 160 11/23/2017 09:30 AM   TRIG 66 11/23/2017 09:30 AM   HDL 63 11/23/2017 09:30 AM   CHOLHDL 2.5 11/23/2017 09:30 AM   CHOLHDL 3.7 05/13/2016 05:47 AM   LDLCALC 84 11/23/2017 09:30 AM    Wt Readings from Last 3 Encounters:  07/30/18 139 lb (63 kg)  05/15/18 140 lb  12.8 oz (63.9 kg)  04/25/18 136 lb 9.6 oz (62 kg)     Objective:    Vital Signs:  Ht 5\' 7"  (1.702 m)   BMI 21.77 kg/m    VITAL SIGNS:  reviewed GEN:  no acute distress EYES:  sclerae anicteric, EOMI - Extraocular Movements Intact NEURO:  alert and oriented x 3, no obvious focal deficit PSYCH:  normal affect  ASSESSMENT & PLAN:    PAF:    Becky Baird has a CHA2DS2 - VASc score of 6.     I have reviewed the dosing of her Xarelto.  Currently her creatinine clearance is fairly good but it  fluctuates significantly so I will leave her on the lower dose.  HTN:   The blood pressure is controlled.  Should continue the meds as listed.   AORTIC STENOSIS:     This was mild to moderate on echo last year.    BRADYCARDIA:      She has no symptoms related to this.  No change in therapy.  CAROTID STENOSIS: This is very mild and can be followed in the clinic but needs no current imaging.  Last testing was 2017.   COVID-19 Education: The signs and symptoms of COVID-19 were discussed with the patient and how to seek care for testing (follow up with PCP or arrange E-visit).  The importance of social distancing was discussed today.  Time:   Today, I have spent 16 minutes with the patient with telehealth technology discussing the above problems.     Medication Adjustments/Labs and Tests Ordered: Current medicines are reviewed at length with the patient today.  Concerns regarding medicines are outlined above.   Tests Ordered: No orders of the defined types were placed in this encounter.   Medication Changes: No orders of the defined types were placed in this encounter.   Disposition:  Follow up with me in one year  Signed, Minus Breeding, MD  12/04/2018 9:58 AM    [place]

## 2018-12-04 ENCOUNTER — Encounter: Payer: Self-pay | Admitting: Cardiology

## 2018-12-04 ENCOUNTER — Telehealth (INDEPENDENT_AMBULATORY_CARE_PROVIDER_SITE_OTHER): Payer: Medicare Other | Admitting: Cardiology

## 2018-12-04 VITALS — BP 123/79 | HR 62 | Ht 67.0 in

## 2018-12-04 DIAGNOSIS — Z7189 Other specified counseling: Secondary | ICD-10-CM | POA: Insufficient documentation

## 2018-12-04 DIAGNOSIS — I480 Paroxysmal atrial fibrillation: Secondary | ICD-10-CM

## 2018-12-04 DIAGNOSIS — R001 Bradycardia, unspecified: Secondary | ICD-10-CM | POA: Insufficient documentation

## 2018-12-04 DIAGNOSIS — I350 Nonrheumatic aortic (valve) stenosis: Secondary | ICD-10-CM

## 2018-12-04 DIAGNOSIS — I10 Essential (primary) hypertension: Secondary | ICD-10-CM | POA: Diagnosis not present

## 2018-12-04 MED ORDER — RIVAROXABAN 15 MG PO TABS: 15.0000 mg | ORAL_TABLET | Freq: Every day | ORAL | 6 refills | Status: DC

## 2018-12-04 NOTE — Telephone Encounter (Signed)
Samples provided 

## 2018-12-04 NOTE — Patient Instructions (Addendum)

## 2018-12-10 ENCOUNTER — Emergency Department (HOSPITAL_COMMUNITY)
Admission: EM | Admit: 2018-12-10 | Discharge: 2018-12-10 | Disposition: A | Discharge status: Home / Self Care | Payer: Medicare Other | Attending: Emergency Medicine | Admitting: Emergency Medicine

## 2018-12-10 ENCOUNTER — Encounter (HOSPITAL_COMMUNITY): Payer: Self-pay | Admitting: Emergency Medicine

## 2018-12-10 ENCOUNTER — Other Ambulatory Visit: Payer: Self-pay

## 2018-12-10 ENCOUNTER — Telehealth: Payer: Self-pay

## 2018-12-10 DIAGNOSIS — Z79899 Other long term (current) drug therapy: Secondary | ICD-10-CM | POA: Insufficient documentation

## 2018-12-10 DIAGNOSIS — I10 Essential (primary) hypertension: Secondary | ICD-10-CM | POA: Diagnosis not present

## 2018-12-10 DIAGNOSIS — Z7901 Long term (current) use of anticoagulants: Secondary | ICD-10-CM | POA: Diagnosis not present

## 2018-12-10 DIAGNOSIS — Z8673 Personal history of transient ischemic attack (TIA), and cerebral infarction without residual deficits: Secondary | ICD-10-CM | POA: Insufficient documentation

## 2018-12-10 DIAGNOSIS — R32 Unspecified urinary incontinence: Secondary | ICD-10-CM | POA: Diagnosis present

## 2018-12-10 DIAGNOSIS — N3001 Acute cystitis with hematuria: Secondary | ICD-10-CM

## 2018-12-10 LAB — URINALYSIS, ROUTINE W REFLEX MICROSCOPIC
Bilirubin Urine: NEGATIVE
Glucose, UA: NEGATIVE mg/dL
Ketones, ur: NEGATIVE mg/dL
Nitrite: NEGATIVE
Protein, ur: 30 mg/dL — AB
Specific Gravity, Urine: 1.008 (ref 1.005–1.030)
WBC, UA: >50 WBC/hpf — ABNORMAL HIGH (ref 0–5)
pH: 5 (ref 5.0–8.0)

## 2018-12-10 MED ORDER — CEPHALEXIN 500 MG PO CAPS
500.0000 mg | ORAL_CAPSULE | Freq: Once | ORAL | Status: AC
  Administered 2018-12-10: 500 mg via ORAL
  Filled 2018-12-10: qty 1

## 2018-12-10 MED ORDER — CEPHALEXIN 500 MG PO CAPS: 500.0000 mg | ORAL_CAPSULE | Freq: Four times a day (QID) | ORAL | 0 refills | Status: DC

## 2018-12-10 NOTE — ED Triage Notes (Signed)
Pt reports new onset of urinary incontinence with pelvic pain since Saturday. Denies fever.

## 2018-12-10 NOTE — Discharge Instructions (Addendum)
Take the antibiotic Keflex as directed.  Would expect improvement in 2 days.  Return for any new or worse symptoms like fever or pain in the low part of your back.  Make an appointment to follow-up with your regular doctor.  Urine has been sent for culture.

## 2018-12-10 NOTE — Telephone Encounter (Signed)
Patient daughter [name] called and states the pt has a history of urosepsis and is now running a fever of 102-102.7 since Saturday, increased confusion,dribbling urine and lower abd discomfort. They state the pt was told to go to the ed last night by the triage nurse,but patient refused. Per Dr.Scott inform daughter [name] to take to the ed as this could be urosepsis again and would need blood work and scans. Patient daughter states understanding.

## 2018-12-10 NOTE — ED Provider Notes (Signed)
[facility] EMERGENCY DEPARTMENT Provider Note   CSN: [id number] Arrival date & time: 12/10/18  1111    History   Chief Complaint Chief Complaint  Patient presents with   Urinary Incontinence    HPI Becky Baird is a 85 y.o. female.     Patient with complaint of urinary frequency and irritation since Sunday.  Patient denies fever or chills.  She has some abdominal discomfort suprapubic area.  That started on Saturday.  And is continued till today.  Patient's had a history of sepsis in the past and patient does not want that to happen again.  Patient was admitted in September 2019 with pyelonephritis.  Patient without any back pain.  No nausea or vomiting.  No diarrhea.     Past Medical History:  Diagnosis Date   Anxiety    Chronic back pain    Hyperlipidemia    Hypertension    Memory loss    Mild cognitive impairment, so stated    Occlusion and stenosis of carotid artery with cerebral infarction    Bilateral less than 50% carotid stenosis.   Persistent atrial fibrillation 06/23/2016   Renal cyst    Sciatica    Stroke ([place])    2010, RMCA with stent   Toe fracture 11/2015   R toe   Ulcer 1970s    Patient Active Problem List   Diagnosis Date Noted   Educated About Covid-19 Virus Infection 12/04/2018   Bradycardia 12/04/2018   Nonrheumatic aortic valve stenosis 12/04/2018   Loss of weight 04/23/2018   Nausea without vomiting 04/23/2018   Bacteremia/Infection due to ESBL-producing Escherichia coli 83/66/2947   Acute metabolic encephalopathy 65/46/5035   Acute pyelonephritis 04/07/2018   AKI (acute kidney injury) ([place]) 04/07/2018   Hypokalemia 04/07/2018   Hyponatremia 04/07/2018   Pyelonephritis 04/07/2018   Acute right-sided low back pain with right-sided sciatica 11/24/2017   Unsteady gait 11/24/2017   Primary osteoarthritis of both hands 11/13/2017   Paroxysmal atrial fibrillation ([place]) 11/23/2016   Murmur 11/23/2016    TIA (transient ischemic attack) 05/12/2016   Altered mental status    Urinary tract infection without hematuria    History of colonic polyps 05/10/2016   History of stroke 03/03/2015   GERD (gastroesophageal reflux disease) 02/06/2015   Aortic sclerosis 12/02/2014   Osteopenia 01/14/2014   Prediabetes 08/05/2013   Hypercalcemia 04/22/2013   Renal insufficiency 02/15/2013   Mild cognitive impairment 01/03/2013   Memory loss 01/03/2013   Ulcer 10/12/2012   Hypertension 10/12/2012   Hyperlipidemia 10/12/2012   Cerebral infarction ([place]) 10/12/2012   HIP PAIN, RIGHT 12/29/2009    Past Surgical History:  Procedure Laterality Date   BIOPSY  04/25/2018   Procedure: BIOPSY;  Surgeon: Rogene Houston, MD;  [facility];  Service: Endoscopy;;  gastric polyps   COLONOSCOPY  03/10/2011   Procedure: COLONOSCOPY;  Surgeon: Rogene Houston, MD;  [facility];  Service: Endoscopy;  Laterality: N/A;   COLONOSCOPY N/A 08/24/2016   Procedure: COLONOSCOPY;  Surgeon: Rogene Houston, MD;  [facility];  Service: Endoscopy;  Laterality: N/A;  [place]     years ago   ESOPHAGOGASTRODUODENOSCOPY N/A 04/25/2018   Procedure: ESOPHAGOGASTRODUODENOSCOPY (EGD);  Surgeon: Rogene Houston, MD;  [facility];  Service: Endoscopy;  Laterality: N/A;  1:15   OVARIAN CYST REMOVAL     at least 10 years ago   POLYPECTOMY  08/24/2016   Procedure: POLYPECTOMY;  Surgeon: Rogene Houston, MD;  [facility];  Service: Endoscopy;;  ascending colon; hepatic flexure   TONSILLECTOMY     childhood     OB History   No obstetric history on file.      Home Medications    Prior to Admission medications   Medication Sig Start Date End Date Taking? Authorizing Provider  ALPRAZolam (XANAX) 0.5 MG tablet TAKE 1/2 TABLET BY MOUTH EVERY 6 HOURS AS NEEDED FOR ANXIETY AND TAKE 1/2 TO 1 TABLET AT BEDTIME AS NEEDED FOR  SLEEP 08/06/18  Yes Luking, Elayne Snare, MD  clobetasol ointment (TEMOVATE) 9.32 % Apply 1 application topically 2 (two) times daily as needed.   Yes [provider]  estradiol (ESTRACE) 0.1 MG/GM vaginal cream Place 1 Applicatorful vaginally once a week.   Yes [provider]  fish oil-omega-3 fatty acids 1000 MG capsule Take 2 g by mouth 2 (two) times a day.    Yes [provider]  hydrochlorothiazide (HYDRODIURIL) 12.5 MG tablet Take 1 tablet (12.5 mg total) by mouth daily. 10/29/18  Yes Luking, Elayne Snare, MD  Iron-FA-B Cmp-C-Biot-Probiotic (FUSION PLUS) CAPS Take 1 capsule by mouth every morning.  05/12/14  Yes [provider]  Ketotifen Fumarate (ALAWAY OP) Apply 1 drop to eye daily as needed.   Yes [provider]  loratadine (CLARITIN) 10 MG tablet Take 1 tablet (10 mg total) by mouth daily. Patient taking differently: Take 10 mg by mouth at bedtime.  02/06/15  Yes Luking, Elayne Snare, MD  OVER THE COUNTER MEDICATION Take 1 capsule by mouth every morning. SUPREMA DOPHILUS   Yes [provider]  pantoprazole (PROTONIX) 40 MG tablet TAKE ONE TABLET BY MOUTH DAILY. 06/04/18  Yes Luking, Elayne Snare, MD  potassium chloride (K-DUR,KLOR-CON) 10 MEQ tablet TAKE ONE TABLET BY MOUTH TWICE DAILY Patient taking differently: Take 10 mEq by mouth daily.  06/04/18  Yes Luking, Elayne Snare, MD  Red Yeast Rice 600 MG CAPS Take 1,200 mg by mouth 2 (two) times a day.    Yes [provider]  Rivaroxaban (XARELTO) 15 MG TABS tablet Take 1 tablet (15 mg total) by mouth daily with supper. 12/04/18  Yes Minus Breeding, MD  sodium chloride (OCEAN) 0.65 % nasal spray Place 1 spray into the nose as needed for congestion.    Yes [provider]  cephALEXin (KEFLEX) 500 MG capsule Take 1 capsule (500 mg total) by mouth 4 (four) times daily. 12/10/18   Fredia Sorrow, MD    Family History Family History  Problem Relation Age of Onset   Stroke Mother    Heart  failure Father    Diabetes Brother     Social History Social History   Tobacco Use   Smoking status: Never Smoker   Smokeless tobacco: Never Used  Substance Use Topics   Alcohol use: No    Alcohol/week: 0.0 standard drinks   Drug use: No     Allergies   Amlodipine and Niaspan [niacin er]   Review of Systems Review of Systems  Constitutional: Negative for chills and fever.  HENT: Negative for congestion, rhinorrhea and sore throat.   Eyes: Negative for visual disturbance.  Respiratory: Negative for cough and shortness of breath.   Cardiovascular: Negative for chest pain and leg swelling.  Gastrointestinal: Negative for abdominal pain, diarrhea, nausea and vomiting.  Genitourinary: Positive for dysuria and frequency. Negative for hematuria.  Musculoskeletal: Negative for back pain and neck pain.  Skin: Negative for rash.  Neurological: Negative for dizziness, light-headedness and headaches.  Hematological: Does not bruise/bleed easily.  Psychiatric/Behavioral: Negative for confusion.     Physical Exam Updated Vital Signs BP (!) 108/49    Pulse (!) 56    Temp (!) 97.5 F (36.4 C) (Oral)    Resp (!) 22    Ht 1.702 m (5\' 7" )    Wt 60.8 kg    SpO2 97%    BMI 20.99 kg/m   Physical Exam Vitals signs and nursing note reviewed.  Constitutional:      General: She is not in acute distress.    Appearance: Normal appearance. She is well-developed. She is not toxic-appearing.  HENT:     Head: Normocephalic and atraumatic.  Eyes:     Extraocular Movements: Extraocular movements intact.     Conjunctiva/sclera: Conjunctivae normal.     Pupils: Pupils are equal, round, and reactive to light.  Neck:     Musculoskeletal: Normal range of motion and neck supple.  Cardiovascular:     Rate and Rhythm: Normal rate and regular rhythm.     Heart sounds: No murmur.  Pulmonary:     Effort: Pulmonary effort is normal. No respiratory distress.     Breath sounds: Normal breath  sounds.  Abdominal:     General: Bowel sounds are normal.     Palpations: Abdomen is soft.     Tenderness: There is no abdominal tenderness.  Musculoskeletal: Normal range of motion.  Skin:    General: Skin is warm and dry.  Neurological:     General: No focal deficit present.     Mental Status: She is alert and oriented to person, place, and time.      ED Treatments / Results  Labs (all labs ordered are listed, but only abnormal results are displayed) Labs Reviewed  URINALYSIS, ROUTINE W REFLEX MICROSCOPIC - Abnormal; Notable for the following components:      Result Value   Color, Urine AMBER (*)    APPearance TURBID (*)    Hgb urine dipstick MODERATE (*)    Protein, ur 30 (*)    Leukocytes,Ua LARGE (*)    WBC, UA >50 (*)    Bacteria, UA MANY (*)    All other components within normal limits  URINE CULTURE    EKG None  Radiology No results found.  Procedures Procedures (including critical care time)  Medications Ordered in ED Medications  cephALEXin (KEFLEX) capsule 500 mg (500 mg Oral Given 12/10/18 1447)     Initial Impression / Assessment and Plan / ED Course  I have reviewed the triage vital signs and the nursing notes.  Pertinent labs & imaging results that were available during my care of the patient were reviewed by me and considered in my medical decision making (see chart for details).       Patient nontoxic no acute distress.  Urinalysis consistent with urinary tract infection.  Urine culture sent.  Will treat with Keflex.  Patient will return for any new or worse symptoms or if not improving in 2 days.   Final Clinical Impressions(s) / ED Diagnoses   Final diagnoses:  Acute cystitis with hematuria    ED Discharge Orders         Ordered    cephALEXin (KEFLEX) 500 MG capsule  4 times daily     12/10/18 1449           Fredia Sorrow, MD 12/10/18 1530

## 2018-12-11 ENCOUNTER — Ambulatory Visit (INDEPENDENT_AMBULATORY_CARE_PROVIDER_SITE_OTHER): Payer: Medicare Other | Admitting: Family Medicine

## 2018-12-11 ENCOUNTER — Encounter: Payer: Self-pay | Admitting: Family Medicine

## 2018-12-11 VITALS — Wt 127.8 lb

## 2018-12-11 DIAGNOSIS — N3000 Acute cystitis without hematuria: Secondary | ICD-10-CM | POA: Diagnosis not present

## 2018-12-11 DIAGNOSIS — R509 Fever, unspecified: Secondary | ICD-10-CM

## 2018-12-11 DIAGNOSIS — R300 Dysuria: Secondary | ICD-10-CM

## 2018-12-11 LAB — POCT URINALYSIS DIPSTICK
Spec Grav, UA: <=1.005 — AB (ref 1.010–1.025)
pH, UA: 6 (ref 5.0–8.0)

## 2018-12-11 MED ORDER — CEFTRIAXONE SODIUM 500 MG IJ SOLR
500.0000 mg | Freq: Once | INTRAMUSCULAR | Status: AC
  Administered 2018-12-11: 500 mg via INTRAMUSCULAR

## 2018-12-11 NOTE — Progress Notes (Signed)
   Subjective:  In person visit  Patient ID: Becky Baird, female    DOB: Apr 18, 1934, 85 y.o.   MRN: [id number] Patient here today because of dysuria and urinary frequency was seen in the ER treated with Keflex feels she is no better family concerned about possible dehydration she did have fever over the weekend.  Not having fever currently.  Some increased forgetfulness but no severe disorientation. Urinary Tract Infection   This is a new problem. The current episode started in the past 7 days (started having stomach bug like symptoms on Friday). Associated symptoms include frequency. Treatments tried: Pt was prescried Keflex 500in er.  Pt went [facility] ED [facility] 12/10/2018.    Pt had diarrhea and sweat starting on Friday. Pt has changed from Mybetriq to Oxybuntin.   Results for orders placed or performed in visit on 12/11/18  POCT Urinalysis Dipstick  Result Value Ref Range   Color, UA     Clarity, UA     Glucose, UA     Bilirubin, UA +    Ketones, UA     Spec Grav, UA <=1.005 (A) 1.010 - 1.025   Blood, UA +    pH, UA 6.0 5.0 - 8.0   Protein, UA     Urobilinogen, UA     Nitrite, UA     Leukocytes, UA Moderate (2+) (A) Negative   Appearance     Odor     Review of Systems  Constitutional: Negative for activity change, appetite change and fatigue.  HENT: Negative for congestion and rhinorrhea.   Respiratory: Negative for cough and shortness of breath.   Cardiovascular: Negative for chest pain and leg swelling.  Gastrointestinal: Negative for abdominal pain and diarrhea.  Endocrine: Negative for polydipsia and polyphagia.  Genitourinary: Positive for frequency.  Skin: Negative for color change.  Neurological: Negative for dizziness and weakness.  Psychiatric/Behavioral: Negative for behavioral problems and confusion.       Objective:   Physical Exam Vitals signs reviewed.  Constitutional:      General: She is not in acute distress. HENT:     Head:  Normocephalic.  Cardiovascular:     Rate and Rhythm: Normal rate and regular rhythm.     Heart sounds: Normal heart sounds. No murmur.  Pulmonary:     Effort: Pulmonary effort is normal.     Breath sounds: Normal breath sounds.  Lymphadenopathy:     Cervical: No cervical adenopathy.  Neurological:     Mental Status: She is alert.  Psychiatric:        Behavior: Behavior normal.   Urine under microscope with WBCs urine culture from ER pending  Skin turgor good capillary refill good urinalysis shows specific gravity is good I doubt dehydration  Patient had side effects from oxybutynin I recommend stopping this at this point.  Patient could not afford mybetric     Assessment & Plan:  UTI with febrile illness recommend Rocephin 500 mg IM continue Keflex give [place] phone call in 48 hours give [place] update on how things are going follow-up if progressive troubles or worse I do not believe the patient needs any type of blood work scans or fluids at this time

## 2018-12-11 NOTE — Patient Instructions (Signed)
Continue the cephalexin 500 mg 1 tablet 4 times daily for the next 7 days Please give [place] feedback on Thursday morning how things are going Call back sooner if any problems

## 2018-12-12 ENCOUNTER — Ambulatory Visit: Payer: Medicare Other | Admitting: Cardiology

## 2018-12-12 LAB — URINE CULTURE: Culture: >=100000 — AB

## 2018-12-13 ENCOUNTER — Other Ambulatory Visit: Payer: Self-pay

## 2018-12-13 ENCOUNTER — Telehealth: Payer: Self-pay | Admitting: *Deleted

## 2018-12-13 ENCOUNTER — Encounter: Payer: Self-pay | Admitting: Family Medicine

## 2018-12-13 ENCOUNTER — Ambulatory Visit (INDEPENDENT_AMBULATORY_CARE_PROVIDER_SITE_OTHER): Payer: Medicare Other | Admitting: Family Medicine

## 2018-12-13 VITALS — Ht 67.0 in | Wt 127.0 lb

## 2018-12-13 DIAGNOSIS — I480 Paroxysmal atrial fibrillation: Secondary | ICD-10-CM

## 2018-12-13 DIAGNOSIS — R310 Gross hematuria: Secondary | ICD-10-CM

## 2018-12-13 LAB — POCT HEMOGLOBIN: Hemoglobin: 12.5 g/dL (ref 11–14.6)

## 2018-12-13 NOTE — Progress Notes (Signed)
   Subjective:  In person visit  Patient ID: Becky Baird, female    DOB: [birth year], 85 y.o.   MRN: [id number]  HPI  Patient arrives with rectal bleeding for 3 days. Patient had some soreness in lower belly but none today. The bleeding is also better than it was when it started.  So initially it was felt that the patient was having rectal bleeding but upon closer discussion it was determined that the blood was within the urine.  This patient was treated in the ER and in our office for UTI.  She is on a blood thinner.  She denies any fever in the past couple days.  Family states that there was blood tinged discharge with her pull-up as well as in the urinal when she urinated they denied any blood in the commode when she had a bowel movement.  There were no clots.  No abdominal pain or flank pain. Results for orders placed or performed in visit on 12/13/18  POCT hemoglobin  Result Value Ref Range   Hemoglobin 12.5 11 - 14.6 g/dL    Rectal exam negative for blood Review of Systems  Constitutional: Negative for activity change, appetite change and fatigue.  HENT: Negative for congestion.   Respiratory: Negative for cough.   Cardiovascular: Negative for chest pain.  Gastrointestinal: Negative for abdominal pain.  Genitourinary: Positive for hematuria. Negative for flank pain and frequency.  Skin: Negative for color change.  Neurological: Negative for headaches.  Psychiatric/Behavioral: Negative for behavioral problems.       Objective:   Physical Exam Constitutional:      Appearance: She is well-developed.  Cardiovascular:     Rate and Rhythm: Normal rate and regular rhythm.     Heart sounds: Normal heart sounds. No murmur.  Pulmonary:     Effort: Pulmonary effort is normal.     Breath sounds: Normal breath sounds.  Abdominal:     General: There is no distension.     Palpations: Abdomen is soft. There is no mass.     Tenderness: There is no abdominal tenderness. There is no  guarding.     Hernia: No hernia is present.  Skin:    General: Skin is warm and dry.  Neurological:     Mental Status: She is alert.     Rectal exam normal negative for blood      Assessment & Plan:  Hematuria Consistent with recent UTI Finish antibiotics Recheck urine about 1 week later If severe hematuria notify us If rectal bleeding notify us right away. Follow-up if progressive troubles or worse.

## 2018-12-13 NOTE — Telephone Encounter (Signed)
Post ED Visit - Positive Culture Follow-up  Culture report reviewed by antimicrobial stewardship pharmacist: [place] Team []  Elenor Quinones, Pharm.D. []  Heide Guile, Pharm.D., BCPS AQ-ID []  Parks Neptune, Pharm.D., BCPS []  Alycia Rossetti, Pharm.D., BCPS []  [place], Pharm.D., BCPS, AAHIVP []  Legrand Como, Pharm.D., BCPS, AAHIVP [x]  Salome Arnt, PharmD, BCPS []  Johnnette Gourd, PharmD, BCPS []  Hughes Better, PharmD, BCPS []  Leeroy Cha, PharmD []  Laqueta Linden, PharmD, BCPS []  Albertina Parr, PharmD  [place] Team []  Leodis Sias, PharmD []  Lindell Spar, PharmD []  Royetta Asal, PharmD []  Graylin Shiver, Rph []  Rema Fendt) Glennon Mac, PharmD []  Arlyn Dunning, PharmD []  Netta Cedars, PharmD []  Dia Sitter, PharmD []  Leone Haven, PharmD []  Gretta Arab, PharmD []  Theodis Shove, PharmD []  Peggyann Juba, PharmD []  Reuel Boom, PharmD   Positive urine culture Treated with Cephalexin, organism sensitive to the same and no further patient follow-up is required at this time.  Harlon Flor [facility] 12/13/2018, 10:02 AM

## 2019-01-02 ENCOUNTER — Telehealth: Payer: Self-pay | Admitting: Family Medicine

## 2019-01-02 MED ORDER — PANTOPRAZOLE SODIUM 40 MG PO TBEC: 40.0000 mg | DELAYED_RELEASE_TABLET | Freq: Every day | ORAL | 5 refills | Status: DC

## 2019-01-02 MED ORDER — ALPRAZOLAM 0.5 MG PO TABS: ORAL_TABLET | ORAL | 5 refills | Status: DC

## 2019-01-02 MED ORDER — POTASSIUM CHLORIDE CRYS ER 10 MEQ PO TBCR: 10.0000 meq | EXTENDED_RELEASE_TABLET | Freq: Two times a day (BID) | ORAL | 5 refills | Status: DC

## 2019-01-02 NOTE — Addendum Note (Signed)
Addended by: Vicente Males on: 01/02/2019 03:37 PM   Modules accepted: Orders

## 2019-01-02 NOTE — Telephone Encounter (Signed)
Ok six mo worth 

## 2019-01-02 NOTE — Telephone Encounter (Signed)
Fax from pharmacy requesting refills Potassium 10 MEQ tablets take on tablet po BID; Pantoprazole 40 mg take one tablet po once daily and Alprazolam 0.5 mg tab take 1/2 tablet po q 6 hours prn anxiety and take 1/2 to 1 tablet qhs prn sleep

## 2019-01-02 NOTE — Telephone Encounter (Signed)
Refills sent to pharmacy. 

## 2019-01-21 DIAGNOSIS — Z8673 Personal history of transient ischemic attack (TIA), and cerebral infarction without residual deficits: Secondary | ICD-10-CM | POA: Diagnosis not present

## 2019-01-21 DIAGNOSIS — I10 Essential (primary) hypertension: Secondary | ICD-10-CM | POA: Diagnosis not present

## 2019-01-21 DIAGNOSIS — M79645 Pain in left finger(s): Secondary | ICD-10-CM | POA: Diagnosis not present

## 2019-01-21 DIAGNOSIS — T63441A Toxic effect of venom of bees, accidental (unintentional), initial encounter: Secondary | ICD-10-CM | POA: Diagnosis not present

## 2019-01-21 DIAGNOSIS — R2232 Localized swelling, mass and lump, left upper limb: Secondary | ICD-10-CM | POA: Diagnosis not present

## 2019-01-21 DIAGNOSIS — T63481A Toxic effect of venom of other arthropod, accidental (unintentional), initial encounter: Secondary | ICD-10-CM | POA: Diagnosis not present

## 2019-01-21 DIAGNOSIS — Z7902 Long term (current) use of antithrombotics/antiplatelets: Secondary | ICD-10-CM | POA: Diagnosis not present

## 2019-01-21 DIAGNOSIS — Z79899 Other long term (current) drug therapy: Secondary | ICD-10-CM | POA: Diagnosis not present

## 2019-01-30 ENCOUNTER — Telehealth: Payer: Self-pay | Admitting: Cardiology

## 2019-01-30 MED ORDER — RIVAROXABAN 15 MG PO TABS: 15.0000 mg | ORAL_TABLET | Freq: Every day | ORAL | 0 refills | Status: DC

## 2019-01-30 NOTE — Telephone Encounter (Signed)
Patient calling the office for samples of medication:   1.  What medication and dosage are you requesting samples for?   (XARELTO) 15 MG TABS    2.  Are you currently out of this medication? No will be out at end of week.   [phone]

## 2019-01-30 NOTE — Telephone Encounter (Signed)
Patient informed that samples for xarelto are available for pick up.

## 2019-02-20 ENCOUNTER — Other Ambulatory Visit: Payer: Self-pay

## 2019-02-20 ENCOUNTER — Ambulatory Visit (INDEPENDENT_AMBULATORY_CARE_PROVIDER_SITE_OTHER): Payer: Medicare Other | Admitting: Family Medicine

## 2019-02-20 DIAGNOSIS — I10 Essential (primary) hypertension: Secondary | ICD-10-CM

## 2019-02-20 DIAGNOSIS — N289 Disorder of kidney and ureter, unspecified: Secondary | ICD-10-CM | POA: Diagnosis not present

## 2019-02-20 DIAGNOSIS — E785 Hyperlipidemia, unspecified: Secondary | ICD-10-CM | POA: Diagnosis not present

## 2019-02-20 NOTE — Progress Notes (Signed)
   Subjective:    Patient ID: Becky Baird, female    DOB: [birth year], 85 y.o.   MRN: [id number]  Hypertension This is a chronic problem. Pertinent negatives include no chest pain or shortness of breath. There are no compliance problems.    Pt states she does not take BP regularly, but she is having someone come by later on to check Bp and weight. Pt has questions about HCTZ, hospitalist back in September changed her from 25 mg- 12.5 mg and her legs began to swell couple weeks ago. Does elevate and flex feet when sitting.   She relates that she had a little bit of swelling in the legs for a couple days in her feet but this is gotten better.  She is on the lower dose of HCTZ because her renal function had gone up Virtual Visit via Video Note  I connected with Becky Baird on 02/20/19 at 10:00 AM EDT by a video enabled telemedicine application and verified that I am speaking with the correct person using two identifiers.  Location: Patient: home Provider: office   I discussed the limitations of evaluation and management by telemedicine and the availability of in person appointments. The patient expressed understanding and agreed to proceed.  History of Present Illness:    Observations/Objective:   Assessment and Plan:   Follow Up Instructions:    I discussed the assessment and treatment plan with the patient. The patient was provided an opportunity to ask questions and all were answered. The patient agreed with the plan and demonstrated an understanding of the instructions.   The patient was advised to call back or seek an in-person evaluation if the symptoms worsen or if the condition fails to improve as anticipated.  I provided 15 minutes of non-face-to-face time during this encounter.   Vicente Males, LPN  Review of Systems  Constitutional: Negative for activity change, appetite change and fatigue.  HENT: Negative for congestion and rhinorrhea.   Respiratory:  Negative for cough and shortness of breath.   Cardiovascular: Negative for chest pain and leg swelling.  Gastrointestinal: Negative for abdominal pain and diarrhea.  Endocrine: Negative for polydipsia and polyphagia.  Skin: Negative for color change.  Neurological: Negative for dizziness and weakness.  Psychiatric/Behavioral: Negative for behavioral problems and confusion.       Objective:   Physical Exam  Today's visit was via telephone Physical exam was not possible for this visit       Assessment & Plan:  Pedal edema resolved Renal insufficiency check lab work somewhere in the next couple months  Follow-up later in the fall sooner problems Continue current medications Healthy diet regular activity recommended HTN- Patient was seen today as part of a visit regarding hypertension. The importance of healthy diet and regular physical activity was discussed. The importance of compliance with medications discussed.  Ideal goal is to keep blood pressure low elevated levels certainly below 711/65 when possible.  The patient was counseled that keeping blood pressure under control lessen his risk of complications.  The importance of regular follow-ups was discussed with the patient.  Low-salt diet such as DASH recommended.  Regular physical activity was recommended as well.  Patient was advised to keep regular follow-ups.

## 2019-03-04 ENCOUNTER — Telehealth: Payer: Self-pay | Admitting: Family Medicine

## 2019-03-04 MED ORDER — HYDROCHLOROTHIAZIDE 12.5 MG PO TABS: 12.5000 mg | ORAL_TABLET | Freq: Every day | ORAL | 3 refills | Status: DC

## 2019-03-05 NOTE — Telephone Encounter (Signed)
error 

## 2019-03-27 ENCOUNTER — Other Ambulatory Visit: Payer: Self-pay | Admitting: Cardiology

## 2019-03-27 MED ORDER — RIVAROXABAN 15 MG PO TABS: 15.0000 mg | ORAL_TABLET | Freq: Every day | ORAL | 0 refills | Status: DC

## 2019-03-27 NOTE — Telephone Encounter (Signed)
Samples available for pickup  Patient aware 

## 2019-03-27 NOTE — Telephone Encounter (Signed)
Called asking for samples of Xarelto

## 2019-03-28 DIAGNOSIS — Z23 Encounter for immunization: Secondary | ICD-10-CM | POA: Diagnosis not present

## 2019-03-29 ENCOUNTER — Telehealth: Payer: Self-pay | Admitting: Family Medicine

## 2019-03-29 NOTE — Telephone Encounter (Signed)
[facility] please put this into historical on immunizations

## 2019-03-29 NOTE — Telephone Encounter (Signed)
Immunization added in [place].

## 2019-03-29 NOTE — Telephone Encounter (Signed)
FYI ONLY Patient called to tell us she got her flu shot yesterday.

## 2019-05-28 ENCOUNTER — Telehealth: Payer: Self-pay | Admitting: Cardiology

## 2019-05-28 MED ORDER — RIVAROXABAN 15 MG PO TABS: 15.0000 mg | ORAL_TABLET | Freq: Every day | ORAL | 0 refills | Status: DC

## 2019-05-28 NOTE — Telephone Encounter (Signed)
Patient calling the office for samples of medication:   1.  What medication and dosage are you requesting samples for? Xalreto  2.  Are you currently out of this medication? One week left

## 2019-05-28 NOTE — Telephone Encounter (Signed)
Pt will call when she gets to office to pick up samples

## 2019-06-03 ENCOUNTER — Other Ambulatory Visit: Payer: Self-pay | Admitting: Family Medicine

## 2019-06-03 ENCOUNTER — Telehealth: Payer: Self-pay | Admitting: Family Medicine

## 2019-06-03 NOTE — Telephone Encounter (Signed)
This +3 refills needs virtual visit before the end of the year

## 2019-06-03 NOTE — Telephone Encounter (Signed)
Pharmacy requesting refill on Xanax 0.5 mg tablet. Take 1/2 tablet po every 6 hrs prn anxiety and 1/2-1 tablet at bedtime for sleep. Please advise. Thank you

## 2019-06-04 ENCOUNTER — Other Ambulatory Visit: Payer: Self-pay | Admitting: Family Medicine

## 2019-06-04 MED ORDER — ALPRAZOLAM 0.5 MG PO TABS: ORAL_TABLET | ORAL | 5 refills | Status: DC

## 2019-06-04 NOTE — Telephone Encounter (Signed)
Prescription was sent as requested

## 2019-06-18 ENCOUNTER — Telehealth: Payer: Self-pay | Admitting: Family Medicine

## 2019-06-18 DIAGNOSIS — I10 Essential (primary) hypertension: Secondary | ICD-10-CM

## 2019-06-18 DIAGNOSIS — E7849 Other hyperlipidemia: Secondary | ICD-10-CM

## 2019-06-18 DIAGNOSIS — Z79899 Other long term (current) drug therapy: Secondary | ICD-10-CM

## 2019-06-18 NOTE — Telephone Encounter (Signed)
Lab orders placed; left message to return call; lab orders mailed to patient.

## 2019-06-18 NOTE — Telephone Encounter (Signed)
Last labs completed on 07/30/2018 BMET, TSH, CBC. Please advise. Thank you

## 2019-06-18 NOTE — Telephone Encounter (Signed)
Patient has six month follow up in January and she wanting to know if she needs labs done.

## 2019-06-18 NOTE — Telephone Encounter (Signed)
Lipid, liver, metabolic 7 

## 2019-06-19 NOTE — Telephone Encounter (Signed)
Pt notified by erica that labs were ordered

## 2019-06-26 ENCOUNTER — Telehealth: Payer: Self-pay | Admitting: Cardiology

## 2019-06-26 MED ORDER — RIVAROXABAN 15 MG PO TABS: 15.0000 mg | ORAL_TABLET | Freq: Every day | ORAL | 0 refills | Status: DC

## 2019-06-26 NOTE — Telephone Encounter (Signed)
Pt will call out office so that we can bring samples out to her

## 2019-06-26 NOTE — Telephone Encounter (Signed)
Patient calling the office for samples of medication:   1.  What medication and dosage are you requesting samples for? xalreto 2.  Are you currently out of this medication?

## 2019-07-01 ENCOUNTER — Other Ambulatory Visit: Payer: Self-pay | Admitting: Family Medicine

## 2019-07-16 ENCOUNTER — Encounter: Payer: Self-pay | Admitting: Urology

## 2019-07-31 ENCOUNTER — Telehealth: Payer: Self-pay | Admitting: Family Medicine

## 2019-07-31 NOTE — Telephone Encounter (Signed)
1.  Wait until at least spring before doing lab #2 I would highly recommend the Covid vaccine for her. More than likely late next week [place] will start setting up vaccine clinics for the public More information on their website should be available starting early next week The vaccines will be by registration/appointment only [email or web address]

## 2019-07-31 NOTE — Telephone Encounter (Signed)
Contacted daughter. Daughter is wanting to know if they should hold off on labs until mid February or so. Daughter and patient are concerned about the risk of getting sick. Pt has been staying really close to home. Pt is also wanting to know if she should get the COVID vaccine. Please advise. Thank you

## 2019-07-31 NOTE — Telephone Encounter (Signed)
Pt daughter contacted and verbalized understanding. Pt would like to cancel upcoming appt. Pt is doing ok and will call back in March to set up another appt

## 2019-07-31 NOTE — Telephone Encounter (Signed)
Tried to contact daughter; line is busy. Pt has active lab orders and the appt for this month is for a 6 month follow up

## 2019-07-31 NOTE — Telephone Encounter (Signed)
Daughter([name]) wanting to know if patient needs to get labs done before her phone visit on 1/25 or should she wait until spring. Lab orders are already in system. And will she need to keep appointment if not getting labs.

## 2019-08-01 ENCOUNTER — Telehealth: Payer: Self-pay

## 2019-08-01 NOTE — Telephone Encounter (Signed)
Patient calling the office for samples of medication:   1.  What medication and dosage are you requesting samples for?  Rivaroxaban (XARELTO) 15 MG TABS    2.  Are you currently out of this medication?

## 2019-08-01 NOTE — Telephone Encounter (Signed)
Pt aware that we are currently out of Xaretlo samples and also had new insurance and will contact pharmacist regarding how much Xarelto is going to cost her now. Pt will call us back in 2 weeks to see if we have received samples

## 2019-08-15 ENCOUNTER — Telehealth: Payer: Self-pay | Admitting: Cardiology

## 2019-08-15 MED ORDER — RIVAROXABAN 15 MG PO TABS: 15.0000 mg | ORAL_TABLET | Freq: Every day | ORAL | 0 refills | Status: DC

## 2019-08-15 NOTE — Telephone Encounter (Signed)
Patient calling the office for samples of medication:   1.  What medication and dosage are you requesting samples for? xarelto 5mg    2.  Are you currently out of this medication?

## 2019-08-15 NOTE — Telephone Encounter (Signed)
Patient did call number you gave her and they will be sending her paperwork to complete.  She said that she has tried before and did not qualify

## 2019-08-15 NOTE — Telephone Encounter (Signed)
Patient advised that xarelto 15 mg samples are available for pick up Discussed patient assistance for xarelto and number [phone]) given to call since she may be eligible for assistance. Advised to let our office know the outcome of the call.  Reports having new prescription coverage for 2021 [employer] and has not filled xarelto at her pharmacy yet. Advised that she should start with requesting a refill to see what her copay will be.  Verbalized understanding of plan.

## 2019-08-19 ENCOUNTER — Ambulatory Visit: Payer: Medicare Other | Admitting: Family Medicine

## 2019-09-24 ENCOUNTER — Other Ambulatory Visit (HOSPITAL_COMMUNITY): Payer: Self-pay | Admitting: Family Medicine

## 2019-09-24 DIAGNOSIS — Z1231 Encounter for screening mammogram for malignant neoplasm of breast: Secondary | ICD-10-CM

## 2019-09-30 ENCOUNTER — Other Ambulatory Visit: Payer: Self-pay | Admitting: Family Medicine

## 2019-09-30 NOTE — Telephone Encounter (Signed)
Last med check up sept 2020

## 2019-09-30 NOTE — Telephone Encounter (Signed)
May have this +3 refills needs follow-up office visit virtual this spring

## 2019-10-03 ENCOUNTER — Ambulatory Visit (HOSPITAL_COMMUNITY): Payer: Medicare Other

## 2019-10-17 ENCOUNTER — Other Ambulatory Visit: Payer: Self-pay | Admitting: *Deleted

## 2019-10-17 MED ORDER — RIVAROXABAN 15 MG PO TABS: 15.0000 mg | ORAL_TABLET | Freq: Every day | ORAL | 0 refills | Status: DC

## 2019-10-29 ENCOUNTER — Other Ambulatory Visit: Payer: Self-pay | Admitting: Family Medicine

## 2019-10-29 NOTE — Telephone Encounter (Signed)
Scheduled 5/24

## 2019-10-29 NOTE — Telephone Encounter (Signed)
Please schedule and then route back.  

## 2019-10-29 NOTE — Telephone Encounter (Signed)
May have 2 months on refill needs to do a follow-up visit either in person or virtual

## 2019-11-09 ENCOUNTER — Other Ambulatory Visit: Payer: Self-pay

## 2019-11-09 ENCOUNTER — Emergency Department (HOSPITAL_COMMUNITY): Payer: Medicare Other

## 2019-11-09 ENCOUNTER — Observation Stay (HOSPITAL_COMMUNITY)
Admission: EM | Admit: 2019-11-09 | Discharge: 2019-11-11 | Disposition: A | Discharge status: Home / Self Care | Payer: Medicare Other | Attending: Internal Medicine | Admitting: Internal Medicine

## 2019-11-09 ENCOUNTER — Encounter (HOSPITAL_COMMUNITY): Payer: Self-pay | Admitting: Emergency Medicine

## 2019-11-09 DIAGNOSIS — R531 Weakness: Secondary | ICD-10-CM | POA: Diagnosis not present

## 2019-11-09 DIAGNOSIS — J984 Other disorders of lung: Secondary | ICD-10-CM | POA: Insufficient documentation

## 2019-11-09 DIAGNOSIS — N390 Urinary tract infection, site not specified: Secondary | ICD-10-CM | POA: Diagnosis not present

## 2019-11-09 DIAGNOSIS — E871 Hypo-osmolality and hyponatremia: Secondary | ICD-10-CM | POA: Diagnosis present

## 2019-11-09 DIAGNOSIS — I129 Hypertensive chronic kidney disease with stage 1 through stage 4 chronic kidney disease, or unspecified chronic kidney disease: Secondary | ICD-10-CM | POA: Diagnosis not present

## 2019-11-09 DIAGNOSIS — R269 Unspecified abnormalities of gait and mobility: Secondary | ICD-10-CM | POA: Insufficient documentation

## 2019-11-09 DIAGNOSIS — I700 Atherosclerosis of aorta: Secondary | ICD-10-CM | POA: Insufficient documentation

## 2019-11-09 DIAGNOSIS — I4819 Other persistent atrial fibrillation: Secondary | ICD-10-CM | POA: Insufficient documentation

## 2019-11-09 DIAGNOSIS — B9620 Unspecified Escherichia coli [E. coli] as the cause of diseases classified elsewhere: Secondary | ICD-10-CM | POA: Insufficient documentation

## 2019-11-09 DIAGNOSIS — K219 Gastro-esophageal reflux disease without esophagitis: Secondary | ICD-10-CM | POA: Insufficient documentation

## 2019-11-09 DIAGNOSIS — R251 Tremor, unspecified: Secondary | ICD-10-CM

## 2019-11-09 DIAGNOSIS — F419 Anxiety disorder, unspecified: Secondary | ICD-10-CM | POA: Insufficient documentation

## 2019-11-09 DIAGNOSIS — E1122 Type 2 diabetes mellitus with diabetic chronic kidney disease: Secondary | ICD-10-CM | POA: Insufficient documentation

## 2019-11-09 DIAGNOSIS — R823 Hemoglobinuria: Secondary | ICD-10-CM | POA: Diagnosis not present

## 2019-11-09 DIAGNOSIS — I6782 Cerebral ischemia: Secondary | ICD-10-CM | POA: Insufficient documentation

## 2019-11-09 DIAGNOSIS — I252 Old myocardial infarction: Secondary | ICD-10-CM | POA: Insufficient documentation

## 2019-11-09 DIAGNOSIS — I10 Essential (primary) hypertension: Secondary | ICD-10-CM | POA: Diagnosis present

## 2019-11-09 DIAGNOSIS — R600 Localized edema: Secondary | ICD-10-CM | POA: Insufficient documentation

## 2019-11-09 DIAGNOSIS — Z7901 Long term (current) use of anticoagulants: Secondary | ICD-10-CM | POA: Insufficient documentation

## 2019-11-09 DIAGNOSIS — Z20822 Contact with and (suspected) exposure to covid-19: Secondary | ICD-10-CM | POA: Insufficient documentation

## 2019-11-09 DIAGNOSIS — R270 Ataxia, unspecified: Secondary | ICD-10-CM | POA: Diagnosis not present

## 2019-11-09 DIAGNOSIS — G9389 Other specified disorders of brain: Secondary | ICD-10-CM | POA: Insufficient documentation

## 2019-11-09 DIAGNOSIS — Z888 Allergy status to other drugs, medicaments and biological substances status: Secondary | ICD-10-CM | POA: Insufficient documentation

## 2019-11-09 DIAGNOSIS — E785 Hyperlipidemia, unspecified: Secondary | ICD-10-CM | POA: Diagnosis not present

## 2019-11-09 DIAGNOSIS — I082 Rheumatic disorders of both aortic and tricuspid valves: Secondary | ICD-10-CM | POA: Insufficient documentation

## 2019-11-09 DIAGNOSIS — I480 Paroxysmal atrial fibrillation: Secondary | ICD-10-CM | POA: Diagnosis present

## 2019-11-09 DIAGNOSIS — N182 Chronic kidney disease, stage 2 (mild): Secondary | ICD-10-CM | POA: Insufficient documentation

## 2019-11-09 DIAGNOSIS — Z79899 Other long term (current) drug therapy: Secondary | ICD-10-CM | POA: Insufficient documentation

## 2019-11-09 DIAGNOSIS — R7303 Prediabetes: Secondary | ICD-10-CM | POA: Diagnosis present

## 2019-11-09 DIAGNOSIS — G8929 Other chronic pain: Secondary | ICD-10-CM | POA: Insufficient documentation

## 2019-11-09 DIAGNOSIS — M5430 Sciatica, unspecified side: Secondary | ICD-10-CM | POA: Insufficient documentation

## 2019-11-09 DIAGNOSIS — Z823 Family history of stroke: Secondary | ICD-10-CM | POA: Insufficient documentation

## 2019-11-09 DIAGNOSIS — Z8673 Personal history of transient ischemic attack (TIA), and cerebral infarction without residual deficits: Secondary | ICD-10-CM | POA: Insufficient documentation

## 2019-11-09 DIAGNOSIS — Z8249 Family history of ischemic heart disease and other diseases of the circulatory system: Secondary | ICD-10-CM | POA: Insufficient documentation

## 2019-11-09 DIAGNOSIS — Z833 Family history of diabetes mellitus: Secondary | ICD-10-CM | POA: Insufficient documentation

## 2019-11-09 DIAGNOSIS — R42 Dizziness and giddiness: Secondary | ICD-10-CM | POA: Diagnosis not present

## 2019-11-09 DIAGNOSIS — Z86010 Personal history of colonic polyps: Secondary | ICD-10-CM | POA: Insufficient documentation

## 2019-11-09 DIAGNOSIS — M549 Dorsalgia, unspecified: Secondary | ICD-10-CM | POA: Insufficient documentation

## 2019-11-09 DIAGNOSIS — G3184 Mild cognitive impairment, so stated: Secondary | ICD-10-CM | POA: Insufficient documentation

## 2019-11-09 NOTE — ED Triage Notes (Signed)
Pt  Comes in POV from home c/o dizziness x3 days. Denies any numbness /tingling. Says she feels like she drunk when she walks.  10 years ago CVA + blood thinners .

## 2019-11-09 NOTE — ED Provider Notes (Signed)
[facility] EMERGENCY DEPARTMENT Provider Note   CSN: [id number] Arrival date & time: 11/09/19  2212     History Chief Complaint  Patient presents with  . Dizziness    Becky Baird is a 86 y.o. female.  Brought in by her daughter.  Patient has not been feeling quite right for about the past 3 days.  I thought maybe it was a urinary tract infection.  She has felt some dizziness feeling and gait disturbances which seem to be more prominent to her daughter here tonight.  Patient in 2010 and had a right MCA CVA stent was placed.  Had full recovery.  Patient also has history of atrial fibrillation and she is on Eliquis.  There has been no falls.  But patient is concerned she would fall.  Denies any upper respiratory symptoms chest pain shortness of breath or any fevers.  Otherwise past medical history significant hypertension hyperlipidemia mild cognitive impairment some memory loss.  Persistent atrial fibrillation.        Past Medical History:  Diagnosis Date  . Anxiety   . Chronic back pain   . Hyperlipidemia   . Hypertension   . Memory loss   . Mild cognitive impairment, so stated   . Occlusion and stenosis of carotid artery with cerebral infarction    Bilateral less than 50% carotid stenosis.  . Persistent atrial fibrillation ([place]) 06/23/2016  . Renal cyst   . Sciatica   . Stroke ([place])    2010, RMCA with stent  . Toe fracture 11/2015   R toe  . Ulcer 1970s    Patient Active Problem List   Diagnosis Date Noted  . Educated about COVID-19 virus infection 12/04/2018  . Bradycardia 12/04/2018  . Nonrheumatic aortic valve stenosis 12/04/2018  . Loss of weight 04/23/2018  . Nausea without vomiting 04/23/2018  . Bacteremia/Infection due to ESBL-producing Escherichia coli 04/08/2018  . Acute metabolic encephalopathy [id number]  . Acute pyelonephritis 04/07/2018  . AKI (acute kidney injury) ([place]) 04/07/2018  . Hypokalemia 04/07/2018  . Hyponatremia 04/07/2018  .  Pyelonephritis 04/07/2018  . Acute right-sided low back pain with right-sided sciatica 11/24/2017  . Unsteady gait 11/24/2017  . Primary osteoarthritis of both hands 11/13/2017  . Paroxysmal atrial fibrillation ([place]) 11/23/2016  . Murmur 11/23/2016  . TIA (transient ischemic attack) 05/12/2016  . Altered mental status   . Urinary tract infection without hematuria   . History of colonic polyps 05/10/2016  . History of stroke 03/03/2015  . GERD (gastroesophageal reflux disease) 02/06/2015  . Aortic sclerosis 12/02/2014  . Osteopenia 01/14/2014  . Prediabetes 08/05/2013  . Hypercalcemia 04/22/2013  . Renal insufficiency 02/15/2013  . Mild cognitive impairment 01/03/2013  . Memory loss 01/03/2013  . Ulcer 10/12/2012  . Hypertension 10/12/2012  . Hyperlipidemia 10/12/2012  . Cerebral infarction ([place]) 10/12/2012  . HIP PAIN, RIGHT 12/29/2009    Past Surgical History:  Procedure Laterality Date  . BIOPSY  04/25/2018   Procedure: BIOPSY;  Surgeon: Rogene Houston, MD;  Location: [facility];  Service: Endoscopy;;  gastric polyps  . COLONOSCOPY  03/10/2011   Procedure: COLONOSCOPY;  Surgeon: Rogene Houston, MD;  Location: [facility];  Service: Endoscopy;  Laterality: N/A;  . COLONOSCOPY N/A 08/24/2016   Procedure: COLONOSCOPY;  Surgeon: Rogene Houston, MD;  Location: [facility] END[facility];  Service: Endoscopy;  Laterality: N/A;  930  . DILATION AND CURETTAGE OF UTERUS     years ago  . ESOPHAGOGASTRODUODENOSCOPY N/A 04/25/2018  Procedure: ESOPHAGOGASTRODUODENOSCOPY (EGD);  Surgeon: Rogene Houston, MD;  Location: [facility];  Service: Endoscopy;  Laterality: N/A;  1:15  . OVARIAN CYST REMOVAL     at least 10 years ago  . POLYPECTOMY  08/24/2016   Procedure: POLYPECTOMY;  Surgeon: Rogene Houston, MD;  Location: [facility];  Service: Endoscopy;;  ascending colon; hepatic flexure  . TONSILLECTOMY     childhood     OB History   No obstetric history on file.     Family History   Problem Relation Age of Onset  . Stroke Mother   . Heart failure Father   . Diabetes Brother     Social History   Tobacco Use  . Smoking status: Never Smoker  . Smokeless tobacco: Never Used  Substance Use Topics  . Alcohol use: No    Alcohol/week: 0.0 standard drinks  . Drug use: No    Home Medications Prior to Admission medications   Medication Sig Start Date End Date Taking? Authorizing Provider  ALPRAZolam (XANAX) 0.5 MG tablet TAKE 1/2 TABLET BY MOUTH EVERY 6 HOURS AS NEEDED FOR ANXIETY AND TAKE 1/2 TO 1 TABLET AT BEDTIME AS NEEDED FOR SLEEP 06/04/19   Luking, Elayne Snare, MD  clobetasol ointment (TEMOVATE) [id number] % Apply 1 application topically 2 (two) times daily as needed.    [provider]  estradiol (ESTRACE) 0.1 MG/GM vaginal cream Place 1 Applicatorful vaginally once a week.    [provider]  fish oil-omega-3 fatty acids 1000 MG capsule Take 2 g by mouth 2 (two) times a day.     [provider]  hydrochlorothiazide (HYDRODIURIL) 12.5 MG tablet TAKE ONE TABLET BY MOUTH DAILY 10/29/19   Kathyrn Drown, MD  Iron-FA-B Cmp-C-Biot-Probiotic (FUSION PLUS) CAPS Take 1 capsule by mouth every morning.  05/12/14   [provider]  Ketotifen Fumarate (ALAWAY OP) Apply 1 drop to eye daily as needed.    [provider]  loratadine (CLARITIN) 10 MG tablet Take 1 tablet (10 mg total) by mouth daily. Patient taking differently: Take 10 mg by mouth at bedtime.  02/06/15   Kathyrn Drown, MD  MYRBETRIQ 50 MG TB24 tablet Take 50 mg by mouth daily. 12/12/18   [provider]  OVER THE COUNTER MEDICATION Take 1 capsule by mouth every morning. [place]    [provider]  pantoprazole (PROTONIX) 40 MG tablet TAKE ONE TABLET BY MOUTH DAILY 10/01/19   Kathyrn Drown, MD  potassium chloride (KLOR-CON) 10 MEQ tablet TAKE ONE TABLET BY MOUTH TWICE DAILY 10/01/19   Luking, Elayne Snare, MD  Red Yeast Rice 600 MG CAPS Take 1,200 mg by mouth 2  (two) times a day.     [provider]  Rivaroxaban (XARELTO) 15 MG TABS tablet Take 1 tablet (15 mg total) by mouth daily with supper. 10/17/19   Minus Breeding, MD  sodium chloride (OCEAN) 0.65 % nasal spray Place 1 spray into the nose as needed for congestion.     [provider]    Allergies    Amlodipine, Niaspan [niacin er], and Oxybutynin  Review of Systems   Review of Systems  Constitutional: Negative for chills and fever.  HENT: Negative for congestion, rhinorrhea and sore throat.   Eyes: Negative for visual disturbance.  Respiratory: Negative for cough and shortness of breath.   Cardiovascular: Negative for chest pain and leg swelling.  Gastrointestinal: Negative for abdominal pain, diarrhea, nausea and vomiting.  Genitourinary: Negative  for dysuria.  Musculoskeletal: Negative for back pain and neck pain.  Skin: Negative for rash.  Neurological: Positive for dizziness, tremors and light-headedness. Negative for syncope, facial asymmetry, speech difficulty, weakness, numbness and headaches.  Hematological: Does not bruise/bleed easily.  Psychiatric/Behavioral: Negative for confusion.    Physical Exam Updated Vital Signs BP (!) 174/66 (BP Location: Right Arm)   Pulse (!) 56   Temp 98.7 F (37.1 C) (Oral)   Resp 11   Ht 1.676 m (5\' 6" )   Wt 57.6 kg   SpO2 98%   BMI 20.50 kg/m   Physical Exam Vitals and nursing note reviewed.  Constitutional:      General: She is not in acute distress.    Appearance: Normal appearance. She is well-developed.  HENT:     Head: Normocephalic and atraumatic.  Eyes:     Extraocular Movements: Extraocular movements intact.     Conjunctiva/sclera: Conjunctivae normal.     Pupils: Pupils are equal, round, and reactive to light.  Cardiovascular:     Rate and Rhythm: Normal rate and regular rhythm.     Heart sounds: No murmur.  Pulmonary:     Effort: Pulmonary effort is normal. No respiratory distress.     Breath  sounds: Normal breath sounds.  Abdominal:     Palpations: Abdomen is soft.     Tenderness: There is no abdominal tenderness.  Musculoskeletal:        General: No swelling. Normal range of motion.     Cervical back: Neck supple.  Skin:    General: Skin is warm and dry.     Capillary Refill: Capillary refill takes less than 2 seconds.  Neurological:     General: No focal deficit present.     Mental Status: She is alert. Mental status is at baseline.     Cranial Nerves: No cranial nerve deficit.     Sensory: No sensory deficit.     Motor: No weakness.     Coordination: Coordination normal.     Gait: Gait abnormal.     Comments: Patient without any motor deficit.  No facial asymmetry.  No facial weakness.  There is tremors in both hands.  No sensory deficit.  But is not very steady on her feet.     ED Results / Procedures / Treatments   Labs (all labs ordered are listed, but only abnormal results are displayed) Labs Reviewed  ETHANOL  PROTIME-INR  APTT  CBC  DIFFERENTIAL  COMPREHENSIVE METABOLIC PANEL  RAPID URINE DRUG SCREEN, HOSP PERFORMED  URINALYSIS, ROUTINE W REFLEX MICROSCOPIC    EKG None  Radiology No results found.  Procedures Procedures (including critical care time)  Medications Ordered in ED Medications - No data to display  ED Course  I have reviewed the triage vital signs and the nursing notes.  Pertinent labs & imaging results that were available during my care of the patient were reviewed by me and considered in my medical decision making (see chart for details).    MDM Rules/Calculators/A&P                      Symptoms according to the daughter been present for 3 days.  Concern would be the ataxia related to a stroke.  We will also do a broad work-up to rule out urinary tract infection or any other significant abnormalities.  Stroke order set ordered.  The patient is not a code stroke candidate based on the time.  Head CT  ordered as  well.  Patient most likely is going to require admission may be a to be admitted to any pain overnight and get an MRI in the morning if head CT is negative.  We will go ahead and order Covid testing.   Final Clinical Impression(s) / ED Diagnoses Final diagnoses:  Ataxia  Tremors of nervous system    Rx / DC Orders ED Discharge Orders    None       Fredia Sorrow, MD 11/09/19 2256

## 2019-11-10 ENCOUNTER — Encounter (HOSPITAL_COMMUNITY): Payer: Self-pay | Admitting: Internal Medicine

## 2019-11-10 ENCOUNTER — Observation Stay (HOSPITAL_COMMUNITY): Payer: Medicare Other

## 2019-11-10 ENCOUNTER — Observation Stay (HOSPITAL_BASED_OUTPATIENT_CLINIC_OR_DEPARTMENT_OTHER): Payer: Medicare Other

## 2019-11-10 DIAGNOSIS — I129 Hypertensive chronic kidney disease with stage 1 through stage 4 chronic kidney disease, or unspecified chronic kidney disease: Secondary | ICD-10-CM | POA: Diagnosis not present

## 2019-11-10 DIAGNOSIS — F419 Anxiety disorder, unspecified: Secondary | ICD-10-CM | POA: Diagnosis not present

## 2019-11-10 DIAGNOSIS — K219 Gastro-esophageal reflux disease without esophagitis: Secondary | ICD-10-CM | POA: Diagnosis not present

## 2019-11-10 DIAGNOSIS — R270 Ataxia, unspecified: Secondary | ICD-10-CM | POA: Diagnosis not present

## 2019-11-10 DIAGNOSIS — E1122 Type 2 diabetes mellitus with diabetic chronic kidney disease: Secondary | ICD-10-CM | POA: Diagnosis not present

## 2019-11-10 DIAGNOSIS — I350 Nonrheumatic aortic (valve) stenosis: Secondary | ICD-10-CM | POA: Diagnosis not present

## 2019-11-10 DIAGNOSIS — E871 Hypo-osmolality and hyponatremia: Secondary | ICD-10-CM | POA: Diagnosis not present

## 2019-11-10 DIAGNOSIS — Z20822 Contact with and (suspected) exposure to covid-19: Secondary | ICD-10-CM | POA: Diagnosis not present

## 2019-11-10 DIAGNOSIS — R251 Tremor, unspecified: Secondary | ICD-10-CM | POA: Diagnosis present

## 2019-11-10 DIAGNOSIS — R600 Localized edema: Secondary | ICD-10-CM | POA: Diagnosis not present

## 2019-11-10 DIAGNOSIS — I351 Nonrheumatic aortic (valve) insufficiency: Secondary | ICD-10-CM | POA: Diagnosis not present

## 2019-11-10 DIAGNOSIS — N390 Urinary tract infection, site not specified: Secondary | ICD-10-CM

## 2019-11-10 DIAGNOSIS — R823 Hemoglobinuria: Secondary | ICD-10-CM | POA: Diagnosis not present

## 2019-11-10 DIAGNOSIS — Z79899 Other long term (current) drug therapy: Secondary | ICD-10-CM | POA: Diagnosis not present

## 2019-11-10 DIAGNOSIS — I480 Paroxysmal atrial fibrillation: Secondary | ICD-10-CM | POA: Diagnosis not present

## 2019-11-10 DIAGNOSIS — I6782 Cerebral ischemia: Secondary | ICD-10-CM | POA: Diagnosis not present

## 2019-11-10 DIAGNOSIS — E785 Hyperlipidemia, unspecified: Secondary | ICD-10-CM | POA: Diagnosis not present

## 2019-11-10 DIAGNOSIS — R269 Unspecified abnormalities of gait and mobility: Secondary | ICD-10-CM | POA: Diagnosis not present

## 2019-11-10 DIAGNOSIS — N182 Chronic kidney disease, stage 2 (mild): Secondary | ICD-10-CM | POA: Diagnosis not present

## 2019-11-10 DIAGNOSIS — J984 Other disorders of lung: Secondary | ICD-10-CM | POA: Diagnosis not present

## 2019-11-10 DIAGNOSIS — Z8673 Personal history of transient ischemic attack (TIA), and cerebral infarction without residual deficits: Secondary | ICD-10-CM | POA: Diagnosis not present

## 2019-11-10 DIAGNOSIS — Z7901 Long term (current) use of anticoagulants: Secondary | ICD-10-CM | POA: Diagnosis not present

## 2019-11-10 DIAGNOSIS — I252 Old myocardial infarction: Secondary | ICD-10-CM | POA: Diagnosis not present

## 2019-11-10 DIAGNOSIS — B9620 Unspecified Escherichia coli [E. coli] as the cause of diseases classified elsewhere: Secondary | ICD-10-CM | POA: Diagnosis not present

## 2019-11-10 DIAGNOSIS — I700 Atherosclerosis of aorta: Secondary | ICD-10-CM | POA: Diagnosis not present

## 2019-11-10 DIAGNOSIS — G9389 Other specified disorders of brain: Secondary | ICD-10-CM | POA: Diagnosis not present

## 2019-11-10 DIAGNOSIS — I082 Rheumatic disorders of both aortic and tricuspid valves: Secondary | ICD-10-CM | POA: Diagnosis not present

## 2019-11-10 DIAGNOSIS — R531 Weakness: Secondary | ICD-10-CM | POA: Diagnosis not present

## 2019-11-10 LAB — URINALYSIS, ROUTINE W REFLEX MICROSCOPIC
Bilirubin Urine: NEGATIVE
Glucose, UA: NEGATIVE mg/dL
Ketones, ur: NEGATIVE mg/dL
Nitrite: POSITIVE — AB
Protein, ur: NEGATIVE mg/dL
Specific Gravity, Urine: 1.003 — ABNORMAL LOW (ref 1.005–1.030)
pH: 6 (ref 5.0–8.0)

## 2019-11-10 LAB — RESPIRATORY PANEL BY RT PCR (FLU A&B, COVID)
Influenza A by PCR: NEGATIVE
Influenza B by PCR: NEGATIVE
SARS Coronavirus 2 by RT PCR: NEGATIVE

## 2019-11-10 LAB — PROTIME-INR
INR: 2 — ABNORMAL HIGH (ref 0.8–1.2)
Prothrombin Time: 22.9 seconds — ABNORMAL HIGH (ref 11.4–15.2)

## 2019-11-10 LAB — HEMOGLOBIN A1C
Hgb A1c MFr Bld: 5.7 % — ABNORMAL HIGH (ref 4.8–5.6)
Mean Plasma Glucose: 116.89 mg/dL

## 2019-11-10 LAB — RAPID URINE DRUG SCREEN, HOSP PERFORMED
Amphetamines: NOT DETECTED
Barbiturates: NOT DETECTED
Benzodiazepines: NOT DETECTED
Cocaine: NOT DETECTED
Opiates: NOT DETECTED
Tetrahydrocannabinol: NOT DETECTED

## 2019-11-10 LAB — DIFFERENTIAL
Abs Immature Granulocytes: 0.02 10*3/uL (ref 0.00–0.07)
Basophils Absolute: 0 10*3/uL (ref 0.0–0.1)
Basophils Relative: 1 %
Eosinophils Absolute: 0.2 10*3/uL (ref 0.0–0.5)
Eosinophils Relative: 3 %
Immature Granulocytes: 0 %
Lymphocytes Relative: 30 %
Lymphs Abs: 2 10*3/uL (ref 0.7–4.0)
Monocytes Absolute: 0.7 10*3/uL (ref 0.1–1.0)
Monocytes Relative: 11 %
Neutro Abs: 3.6 10*3/uL (ref 1.7–7.7)
Neutrophils Relative %: 55 %

## 2019-11-10 LAB — COMPREHENSIVE METABOLIC PANEL
ALT: 20 U/L (ref 0–44)
AST: 24 U/L (ref 15–41)
Albumin: 4.2 g/dL (ref 3.5–5.0)
Alkaline Phosphatase: 60 U/L (ref 38–126)
Anion gap: 10 (ref 5–15)
BUN: 27 mg/dL — ABNORMAL HIGH (ref 8–23)
CO2: 26 mmol/L (ref 22–32)
Calcium: 9.7 mg/dL (ref 8.9–10.3)
Chloride: 96 mmol/L — ABNORMAL LOW (ref 98–111)
Creatinine, Ser: 1.33 mg/dL — ABNORMAL HIGH (ref 0.44–1.00)
GFR calc Af Amer: 43 mL/min — ABNORMAL LOW (ref >60)
GFR calc non Af Amer: 37 mL/min — ABNORMAL LOW (ref >60)
Glucose, Bld: 110 mg/dL — ABNORMAL HIGH (ref 70–99)
Potassium: 3.6 mmol/L (ref 3.5–5.1)
Sodium: 132 mmol/L — ABNORMAL LOW (ref 135–145)
Total Bilirubin: 1 mg/dL (ref 0.3–1.2)
Total Protein: 6.5 g/dL (ref 6.5–8.1)

## 2019-11-10 LAB — LIPID PANEL
Cholesterol: 176 mg/dL (ref 0–200)
HDL: 64 mg/dL (ref >40)
LDL Cholesterol: 99 mg/dL (ref 0–99)
Total CHOL/HDL Ratio: 2.8 RATIO
Triglycerides: 65 mg/dL (ref <150)
VLDL: 13 mg/dL (ref 0–40)

## 2019-11-10 LAB — ECHOCARDIOGRAM COMPLETE
Height: 66 in
Weight: 2031.76 oz

## 2019-11-10 LAB — CBC
HCT: 35.4 % — ABNORMAL LOW (ref 36.0–46.0)
Hemoglobin: 11.7 g/dL — ABNORMAL LOW (ref 12.0–15.0)
MCH: 31.4 pg (ref 26.0–34.0)
MCHC: 33.1 g/dL (ref 30.0–36.0)
MCV: 94.9 fL (ref 80.0–100.0)
Platelets: 200 10*3/uL (ref 150–400)
RBC: 3.73 MIL/uL — ABNORMAL LOW (ref 3.87–5.11)
RDW: 12.6 % (ref 11.5–15.5)
WBC: 6.5 10*3/uL (ref 4.0–10.5)
nRBC: 0 % (ref 0.0–0.2)

## 2019-11-10 LAB — ETHANOL: Alcohol, Ethyl (B): <10 mg/dL (ref <10)

## 2019-11-10 LAB — APTT: aPTT: 44 seconds — ABNORMAL HIGH (ref 24–36)

## 2019-11-10 MED ORDER — SODIUM CHLORIDE 0.9 % IV BOLUS
500.0000 mL | Freq: Once | INTRAVENOUS | Status: AC
  Administered 2019-11-10: 01:00:00 500 mL via INTRAVENOUS

## 2019-11-10 MED ORDER — ACETAMINOPHEN 650 MG RE SUPP: 650.0000 mg | Freq: Four times a day (QID) | RECTAL | Status: DC | PRN

## 2019-11-10 MED ORDER — RIVAROXABAN 15 MG PO TABS: 15.0000 mg | ORAL_TABLET | Freq: Every day | ORAL | Status: DC

## 2019-11-10 MED ORDER — PANTOPRAZOLE SODIUM 40 MG PO TBEC
40.0000 mg | DELAYED_RELEASE_TABLET | Freq: Every day | ORAL | Status: DC
  Administered 2019-11-10 – 2019-11-11 (×2): 40 mg via ORAL
  Filled 2019-11-10 (×2): qty 1

## 2019-11-10 MED ORDER — POTASSIUM CHLORIDE CRYS ER 10 MEQ PO TBCR
10.0000 meq | EXTENDED_RELEASE_TABLET | Freq: Two times a day (BID) | ORAL | Status: DC
  Administered 2019-11-10 – 2019-11-11 (×3): 10 meq via ORAL
  Filled 2019-11-10 (×3): qty 1

## 2019-11-10 MED ORDER — ONDANSETRON HCL 4 MG PO TABS: 4.0000 mg | ORAL_TABLET | Freq: Four times a day (QID) | ORAL | Status: DC | PRN

## 2019-11-10 MED ORDER — LORATADINE 10 MG PO TABS
10.0000 mg | ORAL_TABLET | Freq: Every day | ORAL | Status: DC
  Administered 2019-11-10: 21:00:00 10 mg via ORAL
  Filled 2019-11-10: qty 1

## 2019-11-10 MED ORDER — ONDANSETRON HCL 4 MG/2ML IJ SOLN: 4.0000 mg | Freq: Four times a day (QID) | INTRAMUSCULAR | Status: DC | PRN

## 2019-11-10 MED ORDER — SODIUM CHLORIDE 0.9 % IV SOLN
1.0000 g | Freq: Once | INTRAVENOUS | Status: AC
  Administered 2019-11-10: 1 g via INTRAVENOUS
  Filled 2019-11-10: qty 10

## 2019-11-10 MED ORDER — SODIUM CHLORIDE 0.9 % IV SOLN
1.0000 g | INTRAVENOUS | Status: DC
  Administered 2019-11-10: 1 g via INTRAVENOUS
  Filled 2019-11-10: qty 10

## 2019-11-10 MED ORDER — SODIUM CHLORIDE 0.9 % IV SOLN: INTRAVENOUS | Status: AC

## 2019-11-10 MED ORDER — ACETAMINOPHEN 325 MG PO TABS: 650.0000 mg | ORAL_TABLET | Freq: Four times a day (QID) | ORAL | Status: DC | PRN

## 2019-11-10 MED ORDER — LORAZEPAM 0.5 MG PO TABS: 0.5000 mg | ORAL_TABLET | Freq: Once | ORAL | Status: DC | PRN

## 2019-11-10 MED ORDER — RIVAROXABAN 15 MG PO TABS
15.0000 mg | ORAL_TABLET | Freq: Every day | ORAL | Status: DC
  Administered 2019-11-10: 20:00:00 15 mg via ORAL
  Filled 2019-11-10 (×2): qty 1

## 2019-11-10 MED ORDER — ALPRAZOLAM 0.25 MG PO TABS: 0.2500 mg | ORAL_TABLET | Freq: Three times a day (TID) | ORAL | Status: DC | PRN

## 2019-11-10 MED ORDER — STROKE: EARLY STAGES OF RECOVERY BOOK
Freq: Once | Status: AC
  Filled 2019-11-10 (×2): qty 1

## 2019-11-10 MED ORDER — SODIUM CHLORIDE 0.9 % IV SOLN: 1.0000 g | INTRAVENOUS | Status: DC

## 2019-11-10 NOTE — ED Notes (Signed)
Pt off unit via [facility] at this time 

## 2019-11-10 NOTE — H&P (Signed)
History and Physical    Becky Baird [id number] DOB: 06-29-1934 DOA: 11/09/2019  PCP: Kathyrn Drown, MD  Patient coming from: Home.  I have personally briefly reviewed patient's old medical records in [place]  Chief Complaint: Dizziness.  HPI: Becky Baird is a 86 y.o. female with medical history significant of anxiety, chronic back pain, hyperlipidemia, hypertension, memory loss with mild cognitive impairment, renal cyst, sciatica, history of occlusion and a stenosis of the carotid artery with cerebral infarction, history of right MCA stent following stroke in 2010 who is coming to the emergency department with complaints of dizziness, unsteady gait and weakness for the past 3 days, but particularly worse since yesterday.  She denies headache, vision changes, nausea, emesis, tinnitus, focal weakness or numbness.  No fever, chills, but complains of weakness and fatigue.  She denies sore throat, dyspnea, wheezing or hemoptysis.  No complaints of chest pain, dyspnea, palpitations, diaphoresis, PND, orthopnea, but occasionally gets lower extremity edema.  No abdominal pain, nausea, emesis, diarrhea, constipation, melena or hematochezia.    Dysuria, frequency or hematuria.  ED Course: Initial vital signs temperature 98.7 F, pulse 56, respiration 11, blood pressure 174/66 mmHg and O2 sat 98% on room air.  She received Rocephin in the emergency department.  She received a gram of Rocephin in the ED.   Urinalysis show small hemoglobinuria positive nitrates, small leukocyte esterase, WBC 11-20 per HPI with rare bacteria on microscopic exam.  White count 6.5, hemoglobin 11.7 g/dL and platelets 200.  PT was 22.9, INR 2.0 and PTT 44.  SARS coronavirus 2 and influenza PCR was negative.  Alcohol level was normal.  Sodium 132, potassium 3.6, chloride 96 and CO2 26 mmol/L.  Her glucose 110, BUN 27 and creatinine 1.33 mg/dL.  Calcium and LFTs are within normal range.  Imaging: A one-view  portable chest radiograph did not show any acute cardiopulmonary abnormality.  CT head no acute intracranial process.  There are stable chronic ischemic changes with chronic encephalomalacia within the right temporal lobe.  Review of Systems: As per HPI otherwise all other systems reviewed and are negative.  Past Medical History:  Diagnosis Date  . Anxiety   . Chronic back pain   . Hyperlipidemia   . Hypertension   . Memory loss   . Mild cognitive impairment, so stated   . Occlusion and stenosis of carotid artery with cerebral infarction    Bilateral less than 50% carotid stenosis.  . Persistent atrial fibrillation ([place]) 06/23/2016  . Renal cyst   . Sciatica   . Stroke ([place])    2010, RMCA with stent  . Toe fracture 11/2015   R toe  . Ulcer 1970s    Past Surgical History:  Procedure Laterality Date  . BIOPSY  04/25/2018   Procedure: BIOPSY;  Surgeon: Rogene Houston, MD;  [facility];  Service: Endoscopy;;  gastric polyps  . COLONOSCOPY  03/10/2011   Procedure: COLONOSCOPY;  Surgeon: Rogene Houston, MD;  [facility];  Service: Endoscopy;  Laterality: N/A;  . COLONOSCOPY N/A 08/24/2016   Procedure: COLONOSCOPY;  Surgeon: Rogene Houston, MD;  [facility];  Service: Endoscopy;  Laterality: N/A;  930  . DILATION AND CURETTAGE OF UTERUS     years ago  . ESOPHAGOGASTRODUODENOSCOPY N/A 04/25/2018   Procedure: ESOPHAGOGASTRODUODENOSCOPY (EGD);  Surgeon: Rogene Houston, MD;  [facility];  Service: Endoscopy;  Laterality: N/A;  1:15  . OVARIAN CYST REMOVAL  at least 10 years ago  . POLYPECTOMY  08/24/2016   Procedure: POLYPECTOMY;  Surgeon: Rogene Houston, MD;  [facility];  Service: Endoscopy;;  ascending colon; hepatic flexure  . TONSILLECTOMY     childhood    Social History  reports that she has never smoked. She has never used smokeless tobacco. She reports that she does not drink alcohol or use drugs.  Allergies    Allergen Reactions  . Amlodipine     Pedal edema  . Niaspan [Niacin Er] Rash  . Oxybutynin     Nausea sweats    Family History  Problem Relation Age of Onset  . Stroke Mother   . Heart failure Father   . Diabetes Brother    Prior to Admission medications   Medication Sig Start Date End Date Taking? Authorizing Provider  ALPRAZolam (XANAX) 0.5 MG tablet TAKE 1/2 TABLET BY MOUTH EVERY 6 HOURS AS NEEDED FOR ANXIETY AND TAKE 1/2 TO 1 TABLET AT BEDTIME AS NEEDED FOR SLEEP 06/04/19   Luking, Elayne Snare, MD  clobetasol ointment (TEMOVATE) [id number] % Apply 1 application topically 2 (two) times daily as needed.    [provider]  estradiol (ESTRACE) 0.1 MG/GM vaginal cream Place 1 Applicatorful vaginally once a week.    [provider]  fish oil-omega-3 fatty acids 1000 MG capsule Take 2 g by mouth 2 (two) times a day.     [provider]  hydrochlorothiazide (HYDRODIURIL) 12.5 MG tablet TAKE ONE TABLET BY MOUTH DAILY 10/29/19   Kathyrn Drown, MD  Iron-FA-B Cmp-C-Biot-Probiotic (FUSION PLUS) CAPS Take 1 capsule by mouth every morning.  05/12/14   [provider]  Ketotifen Fumarate (ALAWAY OP) Apply 1 drop to eye daily as needed.    [provider]  loratadine (CLARITIN) 10 MG tablet Take 1 tablet (10 mg total) by mouth daily. Patient taking differently: Take 10 mg by mouth at bedtime.  02/06/15   Kathyrn Drown, MD  MYRBETRIQ 50 MG TB24 tablet Take 50 mg by mouth daily. 12/12/18   [provider]  OVER THE COUNTER MEDICATION Take 1 capsule by mouth every morning. [place]    [provider]  pantoprazole (PROTONIX) 40 MG tablet TAKE ONE TABLET BY MOUTH DAILY 10/01/19   Kathyrn Drown, MD  potassium chloride (KLOR-CON) 10 MEQ tablet TAKE ONE TABLET BY MOUTH TWICE DAILY 10/01/19   Luking, Elayne Snare, MD  Red Yeast Rice 600 MG CAPS Take 1,200 mg by mouth 2 (two) times a day.     [provider]  Rivaroxaban (XARELTO) 15 MG TABS  tablet Take 1 tablet (15 mg total) by mouth daily with supper. 10/17/19   Minus Breeding, MD  sodium chloride (OCEAN) 0.65 % nasal spray Place 1 spray into the nose as needed for congestion.     [provider]   Physical Exam: Vitals:   11/09/19 2230 11/09/19 2234 11/09/19 2300  BP:  (!) 174/66 137/61  Pulse:  (!) 56 (!) 54  Resp:  11 14  Temp:  98.7 F (37.1 C)   TempSrc:  Oral   SpO2:  98% 98%  Weight: 57.6 kg    Height: 5\' 6"  (1.676 m)     Constitutional: NAD, calm, comfortable Eyes: PERRL, lids and conjunctivae normal ENMT: Mucous membranes are moist. Posterior pharynx clear of any exudate or lesions. Neck: normal, supple, no masses, no thyromegaly Respiratory: clear to auscultation bilaterally, no wheezing, no crackles. Normal respiratory effort. No  accessory muscle use.  Cardiovascular: Regular rate and rhythm, no murmurs / rubs / gallops. No extremity edema. 2+ pedal pulses. No carotid bruits.  Abdomen: Nondistended.  BS positive.  Soft, suprapubic tenderness, no flank pain, masses palpated. No hepatosplenomegaly. Musculoskeletal: no clubbing / cyanosis.  Good ROM, no contractures. Normal muscle tone.  Skin: no rashes, lesions, ulcers on limited dermatological examination. Neurologic: CN 2-12 grossly intact. Sensation intact, DTR normal.  Generalized weakness.  Gait was unsteady when checked by Dr. Rogene Houston. Psychiatric: Normal judgment and insight. Alert and oriented x 3. Normal mood.   Labs on Admission: I have personally reviewed following labs and imaging studies  CBC: Recent Labs  Lab 11/10/19 0000  WBC 6.5  NEUTROABS 3.6  HGB 11.7*  HCT 35.4*  MCV 94.9  PLT [id number]    Basic Metabolic Panel: Recent Labs  Lab 11/10/19 0000  NA 132*  K 3.6  CL 96*  CO2 26  GLUCOSE 110*  BUN 27*  CREATININE 1.33*  CALCIUM 9.7    GFR: Estimated Creatinine Clearance: 29.1 mL/min (A) (by C-G formula based on SCr of 1.33 mg/dL (H)).  Liver Function Tests: Recent  Labs  Lab 11/10/19 0000  AST 24  ALT 20  ALKPHOS 60  BILITOT 1.0  PROT 6.5  ALBUMIN 4.2    Urine analysis:    Component Value Date/Time   COLORURINE AMBER (A) 11/09/2019 2315   APPEARANCEUR CLEAR 11/09/2019 2315   LABSPEC 1.003 (L) 11/09/2019 2315   PHURINE 6.0 11/09/2019 2315   GLUCOSEU NEGATIVE 11/09/2019 2315   HGBUR SMALL (A) 11/09/2019 2315   BILIRUBINUR NEGATIVE 11/09/2019 2315   BILIRUBINUR + 12/11/2018 1444   KETONESUR NEGATIVE 11/09/2019 2315   PROTEINUR NEGATIVE 11/09/2019 2315   UROBILINOGEN 0.2 07/15/2016 1439   UROBILINOGEN 0.2 03/05/2015 1513   NITRITE POSITIVE (A) 11/09/2019 2315   LEUKOCYTESUR SMALL (A) 11/09/2019 2315    Radiological Exams on Admission: CT HEAD WO CONTRAST  Result Date: 11/09/2019 CLINICAL DATA:  Ataxia, dizziness, history of stroke EXAM: CT HEAD WITHOUT CONTRAST TECHNIQUE: Contiguous axial images were obtained from the base of the skull through the vertex without intravenous contrast. COMPARISON:  04/07/2018 FINDINGS: Brain: No acute infarct or hemorrhage. Chronic encephalomalacia within the right temporal lobe. Chronic small vessel ischemic changes are seen within the right basal ganglia and periventricular white matter. Lateral ventricles and midline structures are unremarkable. No acute extra-axial fluid collections. No mass effect. Vascular: Stable stent right MCA distribution. No hyperdense vessel. Skull: Normal. Negative for fracture or focal lesion. Sinuses/Orbits: No acute finding. Other: None. IMPRESSION: 1. Stable chronic ischemic changes.  No acute intracranial process. Electronically Signed   By: Randa Ngo M.D.   On: 11/09/2019 23:43   DG Chest Port 1 View  Result Date: 11/09/2019 CLINICAL DATA:  Ataxia EXAM: PORTABLE CHEST 1 VIEW COMPARISON:  Radiograph 04/07/2018 FINDINGS: Stable scarring in the right mid lung. Additional biapical pleuroparenchymal scarring is unchanged. No consolidation, features of edema, pneumothorax, or  effusion. The aorta is calcified. The remaining cardiomediastinal contours are unremarkable. Pulmonary vascularity is normally distributed. Mild dextrocurvature of the spine is similar to priors. No acute osseous or soft tissue abnormality. Degenerative changes are present in the imaged spine and shoulders. Telemetry leads overlie the chest. IMPRESSION: 1. No acute cardiopulmonary abnormality. 2. Stable scarring in the right mid lung and biapical pleuroparenchymal scarring. 3.  Aortic Atherosclerosis (ICD10-I70.0). Electronically Signed   By: Lovena Le M.D.   On: 11/09/2019 23:15   02/22/2018 echocardiogram -------------------------------------------------------------------  LV EF: 65% -  70%   -------------------------------------------------------------------  Indications:   Nonrheumatic Aortic Stenosis.   -------------------------------------------------------------------  History:  PMH: Acquired from the patient and from the patient&'s  chart. Murmur. PMH: Paroxysmal atrial fibrillation History of  stroke, GERD. Risk factors: Prediabetes. Hypertension.  Dyslipidemia.   -------------------------------------------------------------------  Study Conclusions   - Left ventricle: The cavity size was normal. Wall thickness was  increased in a pattern of mild LVH. Systolic function was  vigorous. The estimated ejection fraction was in the range of 65%  to 70%. Wall motion was normal; there were no regional wall  motion abnormalities. Doppler parameters are consistent with  abnormal left ventricular relaxation (grade 1 diastolic  dysfunction). Doppler parameters are consistent with high  ventricular filling pressure.  - Aortic valve: Mildly calcified annulus. Trileaflet; moderately  calcified leaflets. There was mild to moderate stenosis. There  was mild regurgitation. Peak velocity (S): 259 cm/s. Mean  gradient (S): 16 mm Hg. Valve area (VTI): 1.12 cm^2.  Valve area  (Vmax): 1.09 cm^2. Valve area (Vmean): 1.05 cm^2.  - Mitral valve: Mildly calcified annulus. There was mild  regurgitation.  - Left atrium: The atrium was mildly dilated.  - Atrial septum: No defect or patent foramen ovale was identified.  - Tricuspid valve: There was mild regurgitation.  - Pulmonary arteries: PA peak pressure: 36 mm Hg (S).  EKG: Independently reviewed. Vent. rate 58 BPM PR interval * ms QRS duration 87 ms QT/QTc 420/413 ms P-R-T axes 21 6 63 Sinus rhythm Prolonged PR interval Anterior infarct, old  Assessment/Plan Principal Problem:   Ataxia Observation/telemetry. Frequent neuro checks. Check swallow screen. PT/OT/SLP. Check fasting lipids. Check hemoglobin A1c. Check carotid Doppler. Obtain echocardiogram. Check MRI of brain  Active Problems:   Acute lower UTI Most recent urine culture had pansensitive E. coli. Continue ceftriaxone 1 g IVPB every 24 hours. Follow-up urine culture and sensitivity.     Paroxysmal atrial fibrillation (HCC) CHA?DS?-VASc Score of at least 8. Continue Xarelto per pharmacy dosing. May need to change to a different anticoagulant due to GFR.    Hypertension Allow permissive hypertension. Hold hydrochlorothiazide. Monitor blood pressure.    Hyperlipidemia On omega-3 fatty acid. Continue lifestyle modifications.    Prediabetes Carbohydrate modified diet. Check hemoglobin A1c. CBG monitoring before meals and bedtime.    GERD (gastroesophageal reflux disease) Continue pantoprazole 40 mg p.o. daily.    Hyponatremia Secondary to diuretic use. Currently holding HCTZ. Monitor sodium level.   DVT prophylaxis: On Xarelto. Code Status:   Full code. Family Communication:  Daughter was with her in the ED room. Disposition Plan:   Patient is from:  Home.  Anticipated DC to:  Home.  Anticipated DC date:  11/11/2019  Anticipated DC barriers: Transfer and work-up at [facility]. Consults called: Admission  status:  Observation/telemetry.    Severity of Illness: Moderate severity.  Reubin Milan MD [facility]  How to contact the [facility] Attending or Consulting provider [place] or covering provider during after hours [place], for this patient?   1. Check the care team in [facility] and look for a) attending/consulting [facility] provider listed and b) the [facility] team listed 2. Log into [email or web address] and use [place]'s universal password to access. If you do not have the password, please contact the hospital operator. 3. Locate the [facility] provider you are looking for under [facility] and page to a number that you can be directly reached. 4. If you still have difficulty reaching the provider,  please page the [facility] (Director on Call) for the Hospitalists listed on amion for assistance.  11/10/2019, 1:42 AM   This document was prepared using Dragon voice recognition software and may contain some unintended transcription errors.

## 2019-11-10 NOTE — Progress Notes (Signed)
*  PRELIMINARY RESULTS* Echocardiogram 2D Echocardiogram has been performed.  Becky Baird 11/10/2019, 4:56 PM

## 2019-11-10 NOTE — ED Notes (Signed)
[facility] notified of bed ready at this time. Spoke with Marcello Moores.

## 2019-11-10 NOTE — Evaluation (Signed)
Physical Therapy Evaluation Patient Details Name: Becky Baird MRN: [id number] DOB: [birth year] Today's Date: 11/10/2019   History of Present Illness  86 y.o. female with medical history significant of anxiety, chronic back pain, hyperlipidemia, hypertension, memory loss with mild cognitive impairment, sciatica, history of right MCA stent following stroke in 2010 who is presented 11/09/19 with complaints of dizziness, unsteady gait and weakness x several days. +UTI; MRI brain no acute changes; chronic Rt MCA and cerebellar infarcts  Clinical Impression   Pt admitted with above diagnosis. Patient reports almost back to her baseline (required min assist progressing to min guard assist with repetition) and walked 160 ft with min guard assist and no overt loss of balance. Patient refuses to allow PT to shorten her cane (it is ~4" too tall for her) as she feels it helps her stand more upright. Pt currently with functional limitations due to the deficits listed below (see PT Problem List). Pt will benefit from skilled PT to increase their independence and safety with mobility to allow discharge to the venue listed below.    Of note, she has become more sedentary due to COVID ([facility] [employer] [facility] or participate in senior games) and can benefit from [place] to help assess home safety and establish a new home exercise program.      Follow Up Recommendations Home health PT;Supervision/Assistance - 24 hour(24 hour assist/supervision for 1-2 days ) Pt feels family could arrange 1-2 days of 24 hour care)    Equipment Recommendations  None recommended by PT    Recommendations for Other Services       Precautions / Restrictions Precautions Precautions: Fall Precaution Comments: reports has not fallen for more than a year      Mobility  Bed Mobility Overal bed mobility: Modified Independent             General bed mobility comments: from supine and return to  supine  Transfers Overall transfer level: Needs assistance Equipment used: Straight cane Transfers: Sit to/from Stand Sit to Stand: Min assist;Min guard         General transfer comment: pt initially stood with just yellow slipper socks (posterior bias and fearful of falling); put her shoes on over yellow socks and remained unsteady sit to stand "no, this isn't right either"; donned shoes only and improved to minguard assist, however pt remained hesistant due to "no socks feels strange"   Ambulation/Gait Ambulation/Gait assistance: Min guard Gait Distance (Feet): 160 Feet Assistive device: Straight cane(with quad rubber tip) Gait Pattern/deviations: Step-through pattern;Decreased stride length;Wide base of support(rt lateral flexion)   Gait velocity interpretation: <1.8 ft/sec, indicate of risk for recurrent falls General Gait Details: pt prefers SPC set too TALL for her and would not let PT adjust it "I like it this high because it helps me stand up staighter; she would not listen to rationale of lowering cane for better support  Stairs            Wheelchair Mobility    Modified Rankin (Stroke Patients Only)       Balance Overall balance assessment: Mild deficits observed, not formally tested                                           Pertinent Vitals/Pain Pain Assessment: No/denies pain    Home Living Family/patient expects to be discharged  to:: Private residence Living Arrangements: Alone Available Help at Discharge: Family;Available PRN/intermittently(daughter could come stay with her if needed) Type of Home: House Home Access: Stairs to enter Entrance Stairs-Rails: Right Entrance Stairs-Number of Steps: 1 Home Layout: One level Home Equipment: [place] - 4 wheels;Cane - single point;Bedside commode(rubber quad tip on [facility])      Prior Function Level of Independence: Independent with assistive device(s)         Comments: drives; except  for COVID does her own grocery shopping; cleans her home     Hand Dominance        Extremity/Trunk Assessment   Upper Extremity Assessment Upper Extremity Assessment: Defer to OT evaluation    Lower Extremity Assessment Lower Extremity Assessment: Generalized weakness    Cervical / Trunk Assessment Cervical / Trunk Assessment: Kyphotic;Other exceptions Cervical / Trunk Exceptions: rt shoulder lower than left in standing  Communication   Communication: No difficulties  Cognition Arousal/Alertness: Awake/alert Behavior During Therapy: WFL for tasks assessed/performed Overall Cognitive Status: No family/caregiver present to determine baseline cognitive functioning                                 General Comments: pt demonstrated decr memory "now what did you say you wanted me to do?" "Where is my call button?" after given to her and in her lap      General Comments General comments (skin integrity, edema, etc.): no family present; pt reports she is much improved compared to yesterday and feels she will be safe home alone by the time the MD releases her (does acknowledge her family could come stay with her for a couple of days if needed)    Exercises     Assessment/Plan    PT Assessment Patient needs continued PT services  PT Problem List Decreased strength;Decreased balance;Decreased mobility;Decreased knowledge of use of DME       PT Treatment Interventions DME instruction;Gait training;Functional mobility training;Therapeutic activities;Therapeutic exercise;Balance training;Patient/family education    PT Goals (Current goals can be found in the Care Plan section)  Acute Rehab PT Goals Patient Stated Goal: return to her home (hopefully alone) PT Goal Formulation: With patient Time For Goal Achievement: 11/24/19 Potential to Achieve Goals: Good    Frequency Min 3X/week   Barriers to discharge        Co-evaluation               AM-PAC PT "6  Clicks" Mobility  Outcome Measure Help needed turning from your back to your side while in a flat bed without using bedrails?: None Help needed moving from lying on your back to sitting on the side of a flat bed without using bedrails?: A Little Help needed moving to and from a bed to a chair (including a wheelchair)?: A Little Help needed standing up from a chair using your arms (e.g., wheelchair or bedside chair)?: A Little Help needed to walk in hospital room?: A Little Help needed climbing 3-5 steps with a railing? : A Little 6 Click Score: 19    End of Session Equipment Utilized During Treatment: Gait belt Activity Tolerance: Patient tolerated treatment well Patient left: in bed;with call bell/phone within reach(echo coming to see pt and needed her supine) Nurse Communication: Mobility status PT Visit Diagnosis: Unsteadiness on feet (R26.81);Muscle weakness (generalized) (M62.81)    Time: [id number] PT Time Calculation (min) (ACUTE ONLY): 41 min   Charges:   PT Evaluation $PT  Eval Low Complexity: 1 Low PT Treatments $Gait Training: 23-37 mins         Arby Barrette, PT Pager [phone]   Rexanne Mano 11/10/2019, 4:01 PM

## 2019-11-10 NOTE — Care Plan (Signed)
This 86 y.o. female with PMH significant of anxiety, chronic back pain, hyperlipidemia, hypertension, memory loss with mild cognitive impairment, renal cyst, sciatica, history of occlusion and stenosis of the carotid artery with cerebral infarction, history of right MCA stent following stroke in 2010. She presented to the emergency department [facility] with complaints of dizziness, unsteady gait and weakness. CT head no acute intracranial process.  There are stable chronic ischemic changes with chronic encephalomalacia within the right temporal lobe.  Patient seen and examined, she is hemodynamically stable.  She denies dizziness, chest pain, SOB, palpitations.  MRI: No acute intracranial abnormality. 2. Chronic ischemia including chronic right MCA and cerebellar infarcts. 3. Moderately advanced cerebral atrophy.  PT recommended Home health PT.  Continue IV hydration for AKI. Continue IV ceftriaxone for UTI awaiting urine cultures

## 2019-11-10 NOTE — Progress Notes (Signed)
SLP Cancellation Note  Patient Details Name: Becky Baird MRN: [id number] DOB: [birth year]   Cancelled treatment:       Reason Eval/Treat Not Completed: Patient at procedure or test/unavailable(Pt ambulating with physical therapy at this time. SLP will follow up)  Renada Cronin I. Hardin Negus, [place], [place] Office number [phone] Pager [phone]  Horton Marshall 11/10/2019, 3:38 PM

## 2019-11-10 NOTE — ED Provider Notes (Signed)
Care assumed from Dr. Rogene Houston.  Patient with difficulty with balance for the past 3 days and ataxic gait.  Last seen normal 3 days ago.  CT head is negative for any bleeding or large stroke. Urinalysis positive for UTI.  Will start antibiotics and obtain culture.  Plan admission for MRI as well as treatment of her UTI with investigation of her ataxia.  Discussed with Dr. Abelina Bachelor, Annie Main, MD 11/10/19 [phone]

## 2019-11-10 NOTE — ED Notes (Signed)
Report to [facility] and Therapist, sports on [address] [facility]

## 2019-11-10 NOTE — Plan of Care (Signed)
  Problem: Education: Goal: Knowledge of secondary prevention will improve Outcome: Completed/Met Goal: Knowledge of patient specific risk factors addressed and post discharge goals established will improve Outcome: Completed/Met   Problem: Coping: Goal: Will verbalize positive feelings about self Outcome: Completed/Met Goal: Will identify appropriate support needs Outcome: Completed/Met   Problem: Self-Care: Goal: Ability to participate in self-care as condition permits will improve Outcome: Completed/Met Goal: Verbalization of feelings and concerns over difficulty with self-care will improve Outcome: Completed/Met Goal: Ability to communicate needs accurately will improve Outcome: Completed/Met

## 2019-11-10 NOTE — Progress Notes (Signed)
ANTICOAGULATION CONSULT NOTE - Initial Consult  Pharmacy Consult for Xarelto Indication: atrial fibrillation  Allergies  Allergen Reactions  . Amlodipine     Pedal edema  . Niaspan [Niacin Er] Rash  . Oxybutynin     Nausea sweats    Patient Measurements: Height: 5\' 6"  (167.6 cm) Weight: 57.6 kg (126 lb 15.8 oz) IBW/kg (Calculated) : 59.3  Vital Signs: Temp: 98.7 F (37.1 C) (04/17 2234) Temp Source: Oral (04/17 2234) BP: 137/61 (04/17 2300) Pulse Rate: 50 (04/18 0500)  Labs: Recent Labs    11/10/19 0000  HGB 11.7*  HCT 35.4*  PLT 200  APTT 44*  LABPROT 22.9*  INR 2.0*  CREATININE 1.33*    Estimated Creatinine Clearance: 29.1 mL/min (A) (by C-G formula based on SCr of 1.33 mg/dL (H)).   Medical History: Past Medical History:  Diagnosis Date  . Anxiety   . Chronic back pain   . Hyperlipidemia   . Hypertension   . Memory loss   . Mild cognitive impairment, so stated   . Occlusion and stenosis of carotid artery with cerebral infarction    Bilateral less than 50% carotid stenosis.  . Persistent atrial fibrillation ([place]) 06/23/2016  . Renal cyst   . Sciatica   . Stroke ([place])    2010, RMCA with stent  . Toe fracture 11/2015   R toe  . Ulcer 1970s    Medications:  See electronic med rec  Assessment: 86 y.o. F presents with dizziness. CT head negative for acute process. Pt on Xarelto 15mg  daily PTA for afib.   Goal of Therapy:  Prevention of stroke Monitor platelets by anticoagulation protocol: Yes   Plan:  Xarelto 15mg  po daily with supper Will f/u CBC and renal function  Sherlon Handing, PharmD, BCPS Please see amion for complete clinical pharmacist phone list 11/10/2019,6:31 AM

## 2019-11-10 NOTE — Progress Notes (Signed)
Patient seen and examined. Currently hemodynamically stable and afebrile; still complaining of feeling dizzy and with poor balance; no CP, no SOB, no nausea or vomiting. Please refer to H&P written by Dr. Olevia Bowens for further info/details.  Plan: -transfer to [facility] for MRI and neurology evaluation. -continue gentle hydration and empiric treatment for UTI, while waiting culture results. -continue risk factors modifications.  Barton Dubois MD [phone]

## 2019-11-11 DIAGNOSIS — N390 Urinary tract infection, site not specified: Secondary | ICD-10-CM | POA: Diagnosis not present

## 2019-11-11 DIAGNOSIS — R270 Ataxia, unspecified: Secondary | ICD-10-CM | POA: Diagnosis not present

## 2019-11-11 MED ORDER — CEPHALEXIN 500 MG PO CAPS: 500.0000 mg | ORAL_CAPSULE | Freq: Four times a day (QID) | ORAL | 0 refills | Status: AC

## 2019-11-11 NOTE — Care Management Obs Status (Signed)
[place] NOTIFICATION   Patient Details  Name: Becky Baird MRN: [id number] Date of Birth: 01/09/1934   Medicare Observation Status Notification Given:  Yes    Pollie Friar, RN 11/11/2019, 10:16 AM

## 2019-11-11 NOTE — Progress Notes (Signed)
Discharged to home after IV access removed and education provided.

## 2019-11-11 NOTE — TOC Transition Note (Signed)
Transition of Care [facility]) - CM/SW Discharge Note   Patient Details  Name: Becky Baird MRN: [id number] Date of Birth: 07-Apr-1934  [facility] CM/SW Contact:  Pollie Friar, RN Phone Number: 11/11/2019, 11:18 AM   Clinical Narrative:    PCP: Dr Wolfgang Phoenix Pt has a cane, walker and 3 in 1 at home. Pt denies issues with transportation or home meds.  Pt discharging home with [facility] services through [place]. Tommi Rumps with [facility] accepted the referral.  Daughters to provide supervision at home and transportation to home.    Final next level of care: Home w Home Health Services Barriers to Discharge: No Barriers Identified   Patient Goals and CMS Choice   CMS Medicare.gov Compare Post Acute Care list provided to:: Patient Choice offered to / list presented to : Patient, Adult Children  Discharge Placement                       Discharge Plan and Services                          HH Arranged: PT, OT [facility] Agency: [place] Date [place]: 11/11/19   Representative spoke with at [place]: Kim ([place]) Interventions     Readmission Risk Interventions No flowsheet data found.

## 2019-11-11 NOTE — Progress Notes (Signed)
OT Cancellation Note  Patient Details Name: Becky Baird MRN: [id number] DOB: [birth year]   Cancelled Treatment:    Reason Eval/Treat Not Completed: Other (comment). Pt currently in middle of eating her breakfast.  [place], OTR/L Acute Rehab Services Pager [phone] Office [phone]    Almon Register 11/11/2019, 8:15 AM

## 2019-11-11 NOTE — Plan of Care (Signed)
Patient stable, discussed POC with patient and daughter, agreeable with plan. Discussed stroke education via stroke handbook via teach back, verbalizes understanding, denies question/concerns at this time.   

## 2019-11-11 NOTE — Discharge Summary (Signed)
Physician Discharge Summary  LUUL TORELLI [id number] DOB: [birth year] DOA: 11/09/2019  PCP: Kathyrn Drown, MD  Admit date: 11/09/2019 Discharge date: 11/11/2019  Admitted From: home Disposition:  Home  Recommendations for Outpatient Follow-up:  1. Follow up with PCP in 1-2 weeks 2. Please obtain BMP/CBC in one week 3. Please follow up on the following pending results:  Home Health: yes  Equipment/Devices: none  Discharge Condition: Stable Code Status: full Diet recommendation: Heart Healthy  Brief/Interim Summary: 86 y.o.femalewith PMH significant ofanxiety, chronic back pain, hyperlipidemia, hypertension, memory loss with mild cognitive impairment, renal cyst, sciatica, history of occlusion and stenosis of the carotid artery with cerebral infarction, history of right MCA stent following strokein 2010. She presented to the emergency department [facility] with complaints of dizziness, unsteady gait and weakness. CT head no acute intracranial process. There are stable chronic ischemic changes with chronic encephalomalacia within the right temporal lobe.  Patient was monitored in the hospital MRI showed no acute finding but chronic ischemia. Work-up showed UTI culture pending family/daughter wanting to go home today based on previous culture sensitivity she is being discharged on Keflex.  She is being set up with home health therapy.  Patient is alert awake oriented and at baseline mental status with 2 daughters at the bedside.  Discharge Diagnoses:  Acute lower UTI: Patient being discharged home on oral Keflex.  No leukocytosis her mental status has improved. Her balance feeling dizzy: Suspected to be from UTI.  She had further work-up with MRI of the brain that did not show any acute stroke but chronic finding.  Patient is nonfocal on exam no need for further neuro work-up.  She is already on Xarelto. Hyperlipidemia LDL at 99-patient takes omega-3 fatty acid and yeast  pills. PAF she is already anticoagulated on Xarelto. Hypertension blood pressure stable continue home meds Hyperlipidemia Prediabetes GERD Hyponatremia-STABLE AT 132 CKd stage [id number] at 1.3, previous creatinine around 1.1-1.5.  Overall stable.  Patient is to check her chemistry panel from PCP later this week this was discussed with patient's daughter at the bedside  Consults:  PT OT  Subjective: Resting on the bedside chair no nausea vomiting fever.  Alert awake oriented nonfocal on exam.  Discharge Exam: Vitals:   11/11/19 0407 11/11/19 0731  BP: (!) 151/53 (!) 156/56  Pulse: (!) 56 60  Resp: 17 18  Temp: 98.6 F (37 C) 98.6 F (37 C)  SpO2: 96% 99%   General: Pt is alert, awake, not in acute distress Cardiovascular: RRR, S1/S2 +, no rubs, no gallops Respiratory: CTA bilaterally, no wheezing, no rhonchi Abdominal: Soft, NT, ND, bowel sounds + Extremities: no edema, no cyanosis  Discharge Instructions  Discharge Instructions    Diet - low sodium heart healthy   Complete by: As directed    Discharge instructions   Complete by: As directed    Please call call MD or return to ER for similar or worsening recurring problem that brought you to hospital or if any fever,nausea/vomiting,abdominal pain, uncontrolled pain, chest pain,  shortness of breath or any other alarming symptoms.  Please follow-up your doctor as instructed in a week time and call the office for appointment.  Please avoid alcohol, smoking, or any other illicit substance and maintain healthy habits including taking your regular medications as prescribed.  You were cared for by a hospitalist during your hospital stay. If you have any questions about your discharge medications or the care you received while you were in the hospital  after you are discharged, you can call the unit and ask to speak with the hospitalist on call if the hospitalist that took care of you is not available.  Once you are  discharged, your primary care physician will handle any further medical issues. Please note that NO REFILLS for any discharge medications will be authorized once you are discharged, as it is imperative that you return to your primary care physician (or establish a relationship with a primary care physician if you do not have one) for your aftercare needs so that they can reassess your need for medications and monitor your lab values   Increase activity slowly   Complete by: As directed      Allergies as of 11/11/2019      Reactions   Amlodipine    Pedal edema   Niaspan [niacin Er] Rash   Oxybutynin    Nausea sweats      Medication List    TAKE these medications   ALAWAY OP Apply 1 drop to eye daily as needed.   ALPRAZolam 0.5 MG tablet Commonly known as: XANAX TAKE 1/2 TABLET BY MOUTH EVERY 6 HOURS AS NEEDED FOR ANXIETY AND TAKE 1/2 TO 1 TABLET AT BEDTIME AS NEEDED FOR SLEEP   cephALEXin 500 MG capsule Commonly known as: KEFLEX Take 1 capsule (500 mg total) by mouth 4 (four) times daily for 4 days.   clobetasol ointment 0.05 % Commonly known as: TEMOVATE Apply 1 application topically 2 (two) times daily as needed.   estradiol 0.1 MG/GM vaginal cream Commonly known as: ESTRACE Place 1 Applicatorful vaginally once a week.   fish oil-omega-3 fatty acids 1000 MG capsule Take 2 g by mouth 2 (two) times a day.   Fusion Plus Caps Take 1 capsule by mouth every morning.   hydrochlorothiazide 12.5 MG tablet Commonly known as: HYDRODIURIL TAKE ONE TABLET BY MOUTH DAILY   loratadine 10 MG tablet Commonly known as: CLARITIN Take 1 tablet (10 mg total) by mouth daily. What changed: when to take this   Myrbetriq 50 MG Tb24 tablet Generic drug: mirabegron ER Take 50 mg by mouth daily.   OVER THE COUNTER MEDICATION Take 1 capsule by mouth every morning. SUPREMA DOPHILUS   pantoprazole 40 MG tablet Commonly known as: PROTONIX TAKE ONE TABLET BY MOUTH DAILY   potassium  chloride 10 MEQ tablet Commonly known as: KLOR-CON TAKE ONE TABLET BY MOUTH TWICE DAILY   Red Yeast Rice 600 MG Caps Take 1,200 mg by mouth 2 (two) times a day.   Rivaroxaban 15 MG Tabs tablet Commonly known as: Xarelto Take 1 tablet (15 mg total) by mouth daily with supper.   sodium chloride 0.65 % nasal spray Commonly known as: OCEAN Place 1 spray into the nose as needed for congestion.      Follow-up Information    Kathyrn Drown, MD Follow up in 1 week(s).   Specialty: Family Medicine Contact information: [place] [address] [phone]        Minus Breeding, MD .   Specialty: Cardiology Contact information: [address] [place] [address] [place], [facility] Follow up.   Specialty: Home Health Services Why: The home health agency will contact you for the first home visit. Contact information: [place] [address] [phone]          Allergies  Allergen Reactions  . Amlodipine  Pedal edema  . Niaspan [Niacin Er] Rash  . Oxybutynin     Nausea sweats    The results of significant diagnostics from this hospitalization (including imaging, microbiology, ancillary and laboratory) are listed below for reference.    Microbiology: Recent Results (from the past 240 hour(s))  Respiratory Panel by RT PCR (Flu A&B, Covid) - Nasopharyngeal Swab     Status: None   Collection Time: 11/09/19 11:19 PM   Specimen: Nasopharyngeal Swab  Result Value Ref Range Status   SARS Coronavirus 2 by RT PCR NEGATIVE NEGATIVE Final    Comment: (NOTE) SARS-CoV-2 target nucleic acids are NOT DETECTED. The SARS-CoV-2 RNA is generally detectable in upper respiratoy specimens during the acute phase of infection. The lowest concentration of SARS-CoV-2 viral copies this assay can detect is 131 copies/mL. A negative result does not preclude SARS-Cov-2 infection and should not be  used as the sole basis for treatment or other patient management decisions. A negative result may occur with  improper specimen collection/handling, submission of specimen other than nasopharyngeal swab, presence of viral mutation(s) within the areas targeted by this assay, and inadequate number of viral copies (<131 copies/mL). A negative result must be combined with clinical observations, patient history, and epidemiological information. The expected result is Negative. Fact Sheet for Patients:  PinkCheek.be Fact Sheet for Healthcare Providers:  GravelBags.it This test is not yet ap proved or cleared by the [place] FDA and  has been authorized for detection and/or diagnosis of SARS-CoV-2 by FDA under an Emergency Use Authorization (EUA). This EUA will remain  in effect (meaning this test can be used) for the duration of the COVID-19 declaration under Section 564(b)(1) of the Act, 21 U.S.C. section 360bbb-3(b)(1), unless the authorization is terminated or revoked sooner.    Influenza A by PCR NEGATIVE NEGATIVE Final   Influenza B by PCR NEGATIVE NEGATIVE Final    Comment: (NOTE) The Xpert Xpress SARS-CoV-2/FLU/RSV assay is intended as an aid in  the diagnosis of influenza from Nasopharyngeal swab specimens and  should not be used as a sole basis for treatment. Nasal washings and  aspirates are unacceptable for Xpert Xpress SARS-CoV-2/FLU/RSV  testing. Fact Sheet for Patients: PinkCheek.be Fact Sheet for Healthcare Providers: GravelBags.it This test is not yet approved or cleared by the [place] FDA and  has been authorized for detection and/or diagnosis of SARS-CoV-2 by  FDA under an Emergency Use Authorization (EUA). This EUA will remain  in effect (meaning this test can be used) for the duration of the  Covid-19 declaration under Section 564(b)(1) of the  Act, 21  U.S.C. section 360bbb-3(b)(1), unless the authorization is  terminated or revoked. Performed at [facility], [address] [place], [place] [address]     Procedures/Studies: CT HEAD WO CONTRAST  Result Date: 11/09/2019 CLINICAL DATA:  Ataxia, dizziness, history of stroke EXAM: CT HEAD WITHOUT CONTRAST TECHNIQUE: Contiguous axial images were obtained from the base of the skull through the vertex without intravenous contrast. COMPARISON:  04/07/2018 FINDINGS: Brain: No acute infarct or hemorrhage. Chronic encephalomalacia within the right temporal lobe. Chronic small vessel ischemic changes are seen within the right basal ganglia and periventricular white matter. Lateral ventricles and midline structures are unremarkable. No acute extra-axial fluid collections. No mass effect. Vascular: Stable stent right MCA distribution. No hyperdense vessel. Skull: Normal. Negative for fracture or focal lesion. Sinuses/Orbits: No acute finding. Other: None. IMPRESSION: 1. Stable chronic ischemic changes.  No acute intracranial process. Electronically Signed   By: Legrand Como  Owens Shark M.D.   On: 11/09/2019 23:43   MR BRAIN WO CONTRAST  Result Date: 11/10/2019 CLINICAL DATA:  Ataxia. EXAM: MRI HEAD WITHOUT CONTRAST TECHNIQUE: Multiplanar, multiecho pulse sequences of the brain and surrounding structures were obtained without intravenous contrast. COMPARISON:  Head CT 11/09/2019 and MRI 05/12/2016 FINDINGS: Brain: No acute infarct, mass, midline shift, or extra-axial fluid collection is identified. A moderate-sized chronic infarct is again noted in the posterior right MCA territory with associated hemosiderin deposition. There is ex vacuo dilatation of the atrium and temporal horn of the right lateral ventricle. Scattered small foci of T2 hyperintensity in the cerebral white matter bilaterally have at most minimally progressed from the prior MRI and are nonspecific but compatible with mild chronic small vessel  ischemic disease. Small chronic bilateral cerebellar infarcts and a chronic lacunar infarct in the right caudate head are unchanged from the prior MRI. There is moderately advanced cerebral atrophy which is greatest in the parietal lobes. Vascular: Major intracranial vascular flow voids are preserved. Skull and upper cervical spine: Unremarkable bone marrow signal. Sinuses/Orbits: Bilateral cataract extraction. Paranasal sinuses and mastoid air cells are clear. Other: None. IMPRESSION: 1. No acute intracranial abnormality. 2. Chronic ischemia including chronic right MCA and cerebellar infarcts. 3. Moderately advanced cerebral atrophy. Electronically Signed   By: Logan Bores M.D.   On: 11/10/2019 12:08   DG Chest Port 1 View  Result Date: 11/09/2019 CLINICAL DATA:  Ataxia EXAM: PORTABLE CHEST 1 VIEW COMPARISON:  Radiograph 04/07/2018 FINDINGS: Stable scarring in the right mid lung. Additional biapical pleuroparenchymal scarring is unchanged. No consolidation, features of edema, pneumothorax, or effusion. The aorta is calcified. The remaining cardiomediastinal contours are unremarkable. Pulmonary vascularity is normally distributed. Mild dextrocurvature of the spine is similar to priors. No acute osseous or soft tissue abnormality. Degenerative changes are present in the imaged spine and shoulders. Telemetry leads overlie the chest. IMPRESSION: 1. No acute cardiopulmonary abnormality. 2. Stable scarring in the right mid lung and biapical pleuroparenchymal scarring. 3.  Aortic Atherosclerosis (ICD10-I70.0). Electronically Signed   By: Lovena Le M.D.   On: 11/09/2019 23:15   ECHOCARDIOGRAM COMPLETE  Result Date: 11/10/2019    ECHOCARDIOGRAM REPORT   Patient Name:   Becky Baird Date of Exam: 11/10/2019 Medical Rec #:  [id number]       Height:       66.0 in Accession #:    [id number]      Weight:       127.0 lb Date of Birth:  04/17/1934       BSA:          1.649 m Patient Age:    86 years        BP:            158/56 mmHg Patient Gender: F               HR:           59 bpm. Exam Location:  Inpatient Procedure: 2D Echo, Cardiac Doppler and Color Doppler Indications:    TIA 435.9 / G45.9  History:        Patient has prior history of Echocardiogram examinations, most                 recent 02/22/2018. TIA, Arrythmias:Atrial Fibrillation; Risk                 Factors:Hypertension, Dyslipidemia and Prediabetes. GERD                 (  gastroesophageal reflux disease),Nonrheumatic aortic valve                 stenosis/regurgitation,History of stroke.  Sonographer:    Alvino Chapel RCS Referring Phys: [id number] [place]  1. Left ventricular ejection fraction, by estimation, is 70 to 75%. The left ventricle has hyperdynamic function. The left ventricle has no regional wall motion abnormalities. There is mild left ventricular hypertrophy. Left ventricular diastolic parameters are consistent with Grade II diastolic dysfunction (pseudonormalization). Elevated left atrial pressure.  2. Right ventricular systolic function is normal. The right ventricular size is normal. There is mildly elevated pulmonary artery systolic pressure. The estimated right ventricular systolic pressure is 123XX123 mmHg.  3. Left atrial size was mildly dilated.  4. The mitral valve is normal in structure. Trivial mitral valve regurgitation.  5. The aortic valve is abnormal. Moderate calcifications. Aortic valve regurgitation is mild. Moderate aortic valve stenosis. Vmax 3.0 m/s, MG 20 mmHg, AVA 1.4 cm^2, DI 0.49  6. The inferior vena cava is normal in size with greater than 50% respiratory variability, suggesting right atrial pressure of 3 mmHg. FINDINGS  Left Ventricle: Left ventricular ejection fraction, by estimation, is 70 to 75%. The left ventricle has hyperdynamic function. The left ventricle has no regional wall motion abnormalities. The left ventricular internal cavity size was normal in size. There is mild left ventricular hypertrophy.  Left ventricular diastolic parameters are consistent with Grade II diastolic dysfunction (pseudonormalization). Elevated left atrial pressure. Right Ventricle: The right ventricular size is normal. No increase in right ventricular wall thickness. Right ventricular systolic function is normal. There is mildly elevated pulmonary artery systolic pressure. The tricuspid regurgitant velocity is 2.93  m/s, and with an assumed right atrial pressure of 3 mmHg, the estimated right ventricular systolic pressure is 123XX123 mmHg. Left Atrium: Left atrial size was mildly dilated. Right Atrium: Right atrial size was normal in size. Pericardium: There is no evidence of pericardial effusion. Mitral Valve: The mitral valve is normal in structure. Trivial mitral valve regurgitation. Tricuspid Valve: The tricuspid valve is normal in structure. Tricuspid valve regurgitation is mild. Aortic Valve: The aortic valve is abnormal. Aortic valve regurgitation is mild. Aortic regurgitation PHT measures 496 msec. Moderate aortic stenosis is present. There is moderate calcification of the aortic valve. Aortic valve mean gradient measures 17.0  mmHg. Aortic valve peak gradient measures 34.6 mmHg. Aortic valve area, by VTI measures 1.37 cm. Pulmonic Valve: The pulmonic valve was not well visualized. Pulmonic valve regurgitation is not visualized. Aorta: The aortic root is normal in size and structure. Venous: The inferior vena cava is normal in size with greater than 50% respiratory variability, suggesting right atrial pressure of 3 mmHg. IAS/Shunts: The interatrial septum was not well visualized.  LEFT VENTRICLE PLAX 2D LVIDd:         3.80 cm  Diastology LVIDs:         2.10 cm  LV e' lateral:   6.74 cm/s LV PW:         1.00 cm  LV E/e' lateral: 13.9 LV IVS:        1.20 cm  LV e' medial:    6.31 cm/s LVOT diam:     1.90 cm  LV E/e' medial:  14.8 LV SV:         87 LV SV Index:   53 LVOT Area:     2.84 cm  RIGHT VENTRICLE RV S prime:     15.20 cm/s  TAPSE (M-mode): 2.5  cm LEFT ATRIUM           Index       RIGHT ATRIUM           Index LA diam:      3.20 cm 1.94 cm/m  RA Area:     13.00 cm LA Vol (A4C): 55.9 ml 33.91 ml/m RA Volume:   30.40 ml  18.44 ml/m  AORTIC VALVE AV Area (Vmax):    1.24 cm AV Area (Vmean):   1.36 cm AV Area (VTI):     1.37 cm AV Vmax:           294.00 cm/s AV Vmean:          186.000 cm/s AV VTI:            0.634 m AV Peak Grad:      34.6 mmHg AV Mean Grad:      17.0 mmHg LVOT Vmax:         129.00 cm/s LVOT Vmean:        89.200 cm/s LVOT VTI:          0.307 m LVOT/AV VTI ratio: 0.48 AI PHT:            496 msec  AORTA Ao Root diam: 3.00 cm MITRAL VALVE                TRICUSPID VALVE MV Area (PHT): 1.97 cm     TR Peak grad:   34.3 mmHg MV Decel Time: 386 msec     TR Vmax:        293.00 cm/s MV E velocity: 93.50 cm/s MV A velocity: 137.00 cm/s  SHUNTS MV E/A ratio:  0.68         Systemic VTI:  0.31 m                             Systemic Diam: 1.90 cm Oswaldo Milian MD Electronically signed by Oswaldo Milian MD Signature Date/Time: 11/10/2019/9:42:58 PM    Final     Labs: BNP (last 3 results) No results for input(s): BNP in the last 8760 hours. Basic Metabolic Panel: Recent Labs  Lab 11/10/19 0000  NA 132*  K 3.6  CL 96*  CO2 26  GLUCOSE 110*  BUN 27*  CREATININE 1.33*  CALCIUM 9.7   Liver Function Tests: Recent Labs  Lab 11/10/19 0000  AST 24  ALT 20  ALKPHOS 60  BILITOT 1.0  PROT 6.5  ALBUMIN 4.2   No results for input(s): LIPASE, AMYLASE in the last 168 hours. No results for input(s): AMMONIA in the last 168 hours. CBC: Recent Labs  Lab 11/10/19 0000  WBC 6.5  NEUTROABS 3.6  HGB 11.7*  HCT 35.4*  MCV 94.9  PLT 200   Cardiac Enzymes: No results for input(s): CKTOTAL, CKMB, CKMBINDEX, TROPONINI in the last 168 hours. BNP: Invalid input(s): POCBNP CBG: No results for input(s): GLUCAP in the last 168 hours. D-Dimer No results for input(s): DDIMER in the last 72 hours. Hgb  A1c Recent Labs    11/10/19 0000  HGBA1C 5.7*   Lipid Profile Recent Labs    11/10/19 0000  CHOL 176  HDL 64  LDLCALC 99  TRIG 65  CHOLHDL 2.8   Thyroid function studies No results for input(s): TSH, T4TOTAL, T3FREE, THYROIDAB in the last 72 hours.  Invalid input(s): FREET3 Anemia work up No results for input(s): VITAMINB12, FOLATE, FERRITIN, TIBC, IRON, RETICCTPCT  in the last 72 hours. Urinalysis    Component Value Date/Time   COLORURINE AMBER (A) 11/09/2019 2315   APPEARANCEUR CLEAR 11/09/2019 2315   LABSPEC 1.003 (L) 11/09/2019 2315   PHURINE 6.0 11/09/2019 2315   GLUCOSEU NEGATIVE 11/09/2019 2315   HGBUR SMALL (A) 11/09/2019 2315   BILIRUBINUR NEGATIVE 11/09/2019 2315   BILIRUBINUR + 12/11/2018 1444   KETONESUR NEGATIVE 11/09/2019 2315   PROTEINUR NEGATIVE 11/09/2019 2315   UROBILINOGEN 0.2 07/15/2016 1439   UROBILINOGEN 0.2 03/05/2015 1513   NITRITE POSITIVE (A) 11/09/2019 2315   LEUKOCYTESUR SMALL (A) 11/09/2019 2315   Sepsis Labs Invalid input(s): PROCALCITONIN,  WBC,  LACTICIDVEN Microbiology Recent Results (from the past 240 hour(s))  Respiratory Panel by RT PCR (Flu A&B, Covid) - Nasopharyngeal Swab     Status: None   Collection Time: 11/09/19 11:19 PM   Specimen: Nasopharyngeal Swab  Result Value Ref Range Status   SARS Coronavirus 2 by RT PCR NEGATIVE NEGATIVE Final    Comment: (NOTE) SARS-CoV-2 target nucleic acids are NOT DETECTED. The SARS-CoV-2 RNA is generally detectable in upper respiratoy specimens during the acute phase of infection. The lowest concentration of SARS-CoV-2 viral copies this assay can detect is 131 copies/mL. A negative result does not preclude SARS-Cov-2 infection and should not be used as the sole basis for treatment or other patient management decisions. A negative result may occur with  improper specimen collection/handling, submission of specimen other than nasopharyngeal swab, presence of viral mutation(s) within  the areas targeted by this assay, and inadequate number of viral copies (<131 copies/mL). A negative result must be combined with clinical observations, patient history, and epidemiological information. The expected result is Negative. Fact Sheet for Patients:  PinkCheek.be Fact Sheet for Healthcare Providers:  GravelBags.it This test is not yet ap proved or cleared by the [place] FDA and  has been authorized for detection and/or diagnosis of SARS-CoV-2 by FDA under an Emergency Use Authorization (EUA). This EUA will remain  in effect (meaning this test can be used) for the duration of the COVID-19 declaration under Section 564(b)(1) of the Act, 21 U.S.C. section 360bbb-3(b)(1), unless the authorization is terminated or revoked sooner.    Influenza A by PCR NEGATIVE NEGATIVE Final   Influenza B by PCR NEGATIVE NEGATIVE Final    Comment: (NOTE) The Xpert Xpress SARS-CoV-2/FLU/RSV assay is intended as an aid in  the diagnosis of influenza from Nasopharyngeal swab specimens and  should not be used as a sole basis for treatment. Nasal washings and  aspirates are unacceptable for Xpert Xpress SARS-CoV-2/FLU/RSV  testing. Fact Sheet for Patients: PinkCheek.be Fact Sheet for Healthcare Providers: GravelBags.it This test is not yet approved or cleared by the [place] FDA and  has been authorized for detection and/or diagnosis of SARS-CoV-2 by  FDA under an Emergency Use Authorization (EUA). This EUA will remain  in effect (meaning this test can be used) for the duration of the  Covid-19 declaration under Section 564(b)(1) of the Act, 21  U.S.C. section 360bbb-3(b)(1), unless the authorization is  terminated or revoked. Performed at [facility], [address] [place], [place] [address]      Time coordinating discharge: 25  minutes  SIGNED: Antonieta Pert,  MD  [facility] 11/11/2019, 4:59 PM  If 7PM-7AM, please contact night-coverage [email or web address]

## 2019-11-11 NOTE — Progress Notes (Signed)
Occupational Therapy Treatment Patient Details Name: Becky Baird MRN: [id number] DOB: Apr 13, 1934 Today's Date: 11/11/2019    History of present illness 86 y.o. female with medical history significant of anxiety, chronic back pain, hyperlipidemia, hypertension, memory loss with mild cognitive impairment, sciatica, history of right MCA stent following stroke in 2010 who is presented 11/09/19 with complaints of dizziness, unsteady gait and weakness x several days. +UTI; MRI brain no acute changes; chronic Rt MCA and cerebellar infarcts   OT comments  This 86 yo female admitted with above presents to acute OT at a Mod I level from [facility] level for basic ADLs in her hospital room. No further OT needs we will sign off.  Follow Up Recommendations  No OT follow up    Equipment Recommendations  None recommended by OT       Precautions / Restrictions Precautions Precautions: Fall Precaution Comments: reports has not fallen for more than a year Restrictions Weight Bearing Restrictions: No       Mobility Bed Mobility Overal bed mobility: Modified Independent             General bed mobility comments: HOB up  Transfers Overall transfer level: Modified independent Equipment used: Straight cane Transfers: Sit to/from Stand Sit to Stand: Modified independent (Device/Increase time)         General transfer comment: Pt wearing shoes over her own socks, no unsteadiness today     Balance Overall balance assessment: Mild deficits observed, not formally tested                                         ADL either performed or assessed with clinical judgement   ADL Overall ADL's : Modified independent                                       General ADL Comments: from [facility] level for toileting, bathing/dressing at sink     Vision Patient Visual Report: No change from baseline            Cognition Arousal/Alertness: Awake/alert Behavior During  Therapy: WFL for tasks assessed/performed Overall Cognitive Status: Within Functional Limits for tasks assessed                                                     Pertinent Vitals/ Pain       Pain Assessment: No/denies pain  Home Living Family/patient expects to be discharged to:: Private residence Living Arrangements: Alone Available Help at Discharge: Family;Available PRN/intermittently Type of Home: House Home Access: Stairs to enter [employer] of Steps: 1 Entrance Stairs-Rails: Right Home Layout: One level     Bathroom Shower/Tub: Tub/shower unit;Curtain   Biochemist, clinical: Standard     Home Equipment: Environmental consultant - 4 wheels;Cane - single point;Bedside commode;Shower seat;Grab bars - toilet;Hand held shower head          Prior Functioning/Environment Level of Independence: Independent with assistive device(s)        Comments: Does her owen cooking/heating up, plans on getting help 2x/month to help with cleaning       OT Goals(current goals can now be found in the care plan  section)     Acute Rehab OT Goals Patient Stated Goal: to go home today         AM-PAC OT "6 Clicks" Daily Activity     Outcome Measure   Help from another person eating meals?: None Help from another person taking care of personal grooming?: None Help from another person toileting, which includes using toliet, bedpan, or urinal?: None Help from another person bathing (including washing, rinsing, drying)?: None Help from another person to put on and taking off regular upper body clothing?: None Help from another person to put on and taking off regular lower body clothing?: None 6 Click Score: 24    End of Session Equipment Utilized During Treatment: ([facility])  OT Visit Diagnosis: Unsteadiness on feet (R26.81)   Activity Tolerance Patient tolerated treatment well   Patient Left (with NT in room and pt finishing up her ADL)   Nurse Communication (telemetry  off for bathing)        Time: [id number] OT Time Calculation (min): 31 min  Charges: OT General Charges $OT Visit: 1 Visit OT Evaluation $OT Eval Moderate Complexity: 1 Mod OT Treatments $Self Care/Home Management : 8-22 mins Golden Circle, OTR/L Acute [employer] Pager [phone] Office [phone]      Almon Register 11/11/2019, 2:24 PM

## 2019-11-12 ENCOUNTER — Telehealth: Payer: Self-pay | Admitting: Family Medicine

## 2019-11-12 NOTE — Telephone Encounter (Signed)
Left detailed message on voicemail to return call for hospital follow up

## 2019-11-12 NOTE — Telephone Encounter (Signed)
Nurse's-patient recently discharged from the hospital. Please call patient, let them know that we are aware that they were discharged from the hospital. Please schedule them to follow-up with [place] within the next 7 days. Advised the patient to bring all of their medications with him to the visit. Please inquire if they are having any acute issues currently and documented accordingly.  Nurses-please connect with patient she needs to do a follow-up by May 3 for hospital follow-up. You will probably need to set this up through her family

## 2019-11-13 LAB — URINE CULTURE: Culture: >=100000 — AB

## 2019-11-14 DIAGNOSIS — E871 Hypo-osmolality and hyponatremia: Secondary | ICD-10-CM | POA: Diagnosis not present

## 2019-11-14 DIAGNOSIS — Z7901 Long term (current) use of anticoagulants: Secondary | ICD-10-CM | POA: Diagnosis not present

## 2019-11-14 DIAGNOSIS — I252 Old myocardial infarction: Secondary | ICD-10-CM | POA: Diagnosis not present

## 2019-11-14 DIAGNOSIS — N390 Urinary tract infection, site not specified: Secondary | ICD-10-CM | POA: Diagnosis not present

## 2019-11-14 DIAGNOSIS — Z9181 History of falling: Secondary | ICD-10-CM | POA: Diagnosis not present

## 2019-11-14 DIAGNOSIS — G3184 Mild cognitive impairment, so stated: Secondary | ICD-10-CM | POA: Diagnosis not present

## 2019-11-14 DIAGNOSIS — I083 Combined rheumatic disorders of mitral, aortic and tricuspid valves: Secondary | ICD-10-CM | POA: Diagnosis not present

## 2019-11-14 DIAGNOSIS — K219 Gastro-esophageal reflux disease without esophagitis: Secondary | ICD-10-CM | POA: Diagnosis not present

## 2019-11-14 DIAGNOSIS — F419 Anxiety disorder, unspecified: Secondary | ICD-10-CM | POA: Diagnosis not present

## 2019-11-14 DIAGNOSIS — I4819 Other persistent atrial fibrillation: Secondary | ICD-10-CM | POA: Diagnosis not present

## 2019-11-14 DIAGNOSIS — B9620 Unspecified Escherichia coli [E. coli] as the cause of diseases classified elsewhere: Secondary | ICD-10-CM | POA: Diagnosis not present

## 2019-11-14 DIAGNOSIS — R7303 Prediabetes: Secondary | ICD-10-CM | POA: Diagnosis not present

## 2019-11-14 DIAGNOSIS — I119 Hypertensive heart disease without heart failure: Secondary | ICD-10-CM | POA: Diagnosis not present

## 2019-11-14 DIAGNOSIS — Z86010 Personal history of colonic polyps: Secondary | ICD-10-CM | POA: Diagnosis not present

## 2019-11-14 DIAGNOSIS — Z8673 Personal history of transient ischemic attack (TIA), and cerebral infarction without residual deficits: Secondary | ICD-10-CM | POA: Diagnosis not present

## 2019-11-14 DIAGNOSIS — N281 Cyst of kidney, acquired: Secondary | ICD-10-CM | POA: Diagnosis not present

## 2019-11-14 DIAGNOSIS — G8929 Other chronic pain: Secondary | ICD-10-CM | POA: Diagnosis not present

## 2019-11-14 DIAGNOSIS — J984 Other disorders of lung: Secondary | ICD-10-CM | POA: Diagnosis not present

## 2019-11-14 DIAGNOSIS — M5430 Sciatica, unspecified side: Secondary | ICD-10-CM | POA: Diagnosis not present

## 2019-11-14 DIAGNOSIS — M479 Spondylosis, unspecified: Secondary | ICD-10-CM | POA: Diagnosis not present

## 2019-11-14 DIAGNOSIS — E785 Hyperlipidemia, unspecified: Secondary | ICD-10-CM | POA: Diagnosis not present

## 2019-11-14 DIAGNOSIS — I700 Atherosclerosis of aorta: Secondary | ICD-10-CM | POA: Diagnosis not present

## 2019-11-15 NOTE — Telephone Encounter (Signed)
Sent patient MyChart message.

## 2019-11-18 ENCOUNTER — Telehealth: Payer: Self-pay | Admitting: Family Medicine

## 2019-11-18 ENCOUNTER — Telehealth: Payer: Self-pay | Admitting: *Deleted

## 2019-11-18 NOTE — Telephone Encounter (Signed)
That is okay.

## 2019-11-18 NOTE — Telephone Encounter (Signed)
Nurses  I will be respectful of family's request to a degree.  There are gentle ways of phrasing this type of issue that still allows for speaking truth but in a gentle way. Then I can always spend an additional few minutes with the daughter separate from [place] to discuss in more detail

## 2019-11-18 NOTE — Telephone Encounter (Signed)
Sharyn Lull called to say that she called the patient and the patient does not want occupational therapy.  She wants only the physical therapy part.  So they need a verbal canceling the OT.  Sharyn Lull with [facility]mings [phone]

## 2019-11-18 NOTE — Telephone Encounter (Signed)
Pt's daughter Becky Baird called and was concerned about dr scott discussing MRI results with pt at her visit on Wednesday this week. Becky Baird states she has read the report and does not want Becky Baird to know because of her anxiety but she does want dr Nicki Reaper to discuss results with her and sister Becky Baird.   [name]'s number [phone]

## 2019-11-18 NOTE — Telephone Encounter (Signed)
Pt is aware of appt 4/28

## 2019-11-18 NOTE — Telephone Encounter (Signed)
Verbal order given  

## 2019-11-18 NOTE — Telephone Encounter (Signed)
Called and discussed with pt's daughter teresa.

## 2019-11-19 ENCOUNTER — Telehealth (INDEPENDENT_AMBULATORY_CARE_PROVIDER_SITE_OTHER): Payer: Medicare Other | Admitting: Family Medicine

## 2019-11-19 ENCOUNTER — Telehealth: Payer: Self-pay | Admitting: *Deleted

## 2019-11-19 ENCOUNTER — Other Ambulatory Visit: Payer: Self-pay

## 2019-11-19 DIAGNOSIS — N390 Urinary tract infection, site not specified: Secondary | ICD-10-CM | POA: Diagnosis not present

## 2019-11-19 DIAGNOSIS — I517 Cardiomegaly: Secondary | ICD-10-CM

## 2019-11-19 DIAGNOSIS — I5189 Other ill-defined heart diseases: Secondary | ICD-10-CM

## 2019-11-19 DIAGNOSIS — I350 Nonrheumatic aortic (valve) stenosis: Secondary | ICD-10-CM

## 2019-11-19 DIAGNOSIS — I4819 Other persistent atrial fibrillation: Secondary | ICD-10-CM | POA: Diagnosis not present

## 2019-11-19 DIAGNOSIS — I083 Combined rheumatic disorders of mitral, aortic and tricuspid valves: Secondary | ICD-10-CM | POA: Diagnosis not present

## 2019-11-19 DIAGNOSIS — I119 Hypertensive heart disease without heart failure: Secondary | ICD-10-CM | POA: Diagnosis not present

## 2019-11-19 DIAGNOSIS — I252 Old myocardial infarction: Secondary | ICD-10-CM | POA: Diagnosis not present

## 2019-11-19 DIAGNOSIS — B9620 Unspecified Escherichia coli [E. coli] as the cause of diseases classified elsewhere: Secondary | ICD-10-CM | POA: Diagnosis not present

## 2019-11-19 NOTE — Telephone Encounter (Signed)
Ms. chauntelle, woolstenhulme are scheduled for a virtual visit with your provider today.    Just as we do with appointments in the office, we must obtain your consent to participate.  Your consent will be active for this visit and any virtual visit you may have with one of our providers in the next 365 days.    If you have a MyChart account, I can also send a copy of this consent to you electronically.  All virtual visits are billed to your insurance company just like a traditional visit in the office.  As this is a virtual visit, video technology does not allow for your provider to perform a traditional examination.  This may limit your provider's ability to fully assess your condition.  If your provider identifies any concerns that need to be evaluated in person or the need to arrange testing such as labs, EKG, etc, we will make arrangements to do so.    Although advances in technology are sophisticated, we cannot ensure that it will always work on either your end or our end.  If the connection with a video visit is poor, we may have to switch to a telephone visit.  With either a video or telephone visit, we are not always able to ensure that we have a secure connection.   I need to obtain your verbal consent now.   Are you willing to proceed with your visit today?   DAZHANE LANCON has provided verbal consent on 11/19/2019 for a virtual visit (video or telephone).   Dayton Bailiff, LPN [phone]  [id number] AM

## 2019-11-19 NOTE — Progress Notes (Signed)
   Subjective:    Patient ID: Becky Baird, female    DOB: [birth year], 86 y.o.   MRN: [id number]  HPIpt's daughter Becky Baird ( who is on dpr) requested an appointment to speak with Dr. Nicki Reaper before bringing in her mother in tomorrow to go over MRI results.  This patient had a recent stroke had CAT scan and MRI this was talked with in detail including echo Virtual Visit via Telephone Note  I connected with [name] on 11/19/19 at  2:00 PM EDT by telephone and verified that I am speaking with the correct person using two identifiers.  Location: Patient: home Provider: office   I discussed the limitations, risks, security and privacy concerns of performing an evaluation and management service by telephone and the availability of in person appointments. I also discussed with the patient that there may be a patient responsible charge related to this service. The patient expressed understanding and agreed to proceed.   History of Present Illness:    Observations/Objective:   Assessment and Plan:   Follow Up Instructions:    I discussed the assessment and treatment plan with the patient. The patient was provided an opportunity to ask questions and all were answered. The patient agreed with the plan and demonstrated an understanding of the instructions.   The patient was advised to call back or seek an in-person evaluation if the symptoms worsen or if the condition fails to improve as anticipated.  I provided 30 minutes of non-face-to-face time during this encounter.       Review of Systems     Objective:   Physical Exam        Assessment & Plan:  Left ventricular hypertrophy Diastolic dysfunction Cerebral atrophy Left ventricle swelling Diastolic dysfunction  Will be moving forward with cardiology consult  Cerebral atrophy discussed with family patient having some forgetfulness we will do a [place] cognitive assessment when the patient comes

## 2019-11-20 ENCOUNTER — Other Ambulatory Visit: Payer: Self-pay

## 2019-11-20 ENCOUNTER — Telehealth: Payer: Self-pay | Admitting: Family Medicine

## 2019-11-20 ENCOUNTER — Ambulatory Visit (INDEPENDENT_AMBULATORY_CARE_PROVIDER_SITE_OTHER): Payer: Medicare Other | Admitting: Family Medicine

## 2019-11-20 VITALS — BP 114/82 | Temp 97.9°F

## 2019-11-20 DIAGNOSIS — M545 Low back pain, unspecified: Secondary | ICD-10-CM

## 2019-11-20 DIAGNOSIS — I350 Nonrheumatic aortic (valve) stenosis: Secondary | ICD-10-CM

## 2019-11-20 DIAGNOSIS — G8929 Other chronic pain: Secondary | ICD-10-CM

## 2019-11-20 DIAGNOSIS — R0600 Dyspnea, unspecified: Secondary | ICD-10-CM

## 2019-11-20 DIAGNOSIS — R0609 Other forms of dyspnea: Secondary | ICD-10-CM

## 2019-11-20 DIAGNOSIS — F09 Unspecified mental disorder due to known physiological condition: Secondary | ICD-10-CM

## 2019-11-20 DIAGNOSIS — Z8673 Personal history of transient ischemic attack (TIA), and cerebral infarction without residual deficits: Secondary | ICD-10-CM | POA: Diagnosis not present

## 2019-11-20 DIAGNOSIS — R300 Dysuria: Secondary | ICD-10-CM

## 2019-11-20 LAB — POCT URINALYSIS DIPSTICK (MANUAL)
Leukocytes, UA: NEGATIVE
Nitrite, UA: NEGATIVE
Poct Blood: NEGATIVE
Spec Grav, UA: <=1.005 — AB (ref 1.010–1.025)
pH, UA: 6 (ref 5.0–8.0)

## 2019-11-20 MED ORDER — ALPRAZOLAM 0.25 MG PO TABS: ORAL_TABLET | ORAL | 3 refills | Status: DC

## 2019-11-20 NOTE — Progress Notes (Signed)
Subjective:    Patient ID: Becky Baird, female    DOB: February 12, 1934, 86 y.o.   MRN: [id number]  HPI  Transitional visit Patient comes in today with her daughters to discuss MRI results/Hospital visit following recent stroke.  Patient presents for follow-up regarding hospitalization We went over her medications We went over the MRI of the CT scan in detail We also went over her recent echo Also did [place] cognitive assessment I also spoke with radiologist regarding her MRI it does not show any appreciable change from previous Dilated ventricle was not felt to be a sign of normal pressure hydrocephalus Family has noticed a little more forgetfulness and then previous and a little more difficulty thinking things through Patient does do local driving in [place] daytime only no accidents or wraps  Patient would like to discuss Boost/Glucerna shakes to see if she should be drinking them.  Results for orders placed or performed in visit on 11/20/19  POCT Urinalysis Dip Manual  Result Value Ref Range   Spec Grav, UA <=1.005 (A) 1.010 - 1.025   pH, UA 6.0 5.0 - 8.0   Leukocytes, UA Negative Negative   Nitrite, UA Negative Negative   Poct Protein     Poct Glucose     Poct Ketones     Poct Urobilinogen     Poct Bilirubin     Poct Blood Negative Negative, trace    Review of Systems  Constitutional: Positive for fatigue. Negative for activity change and appetite change.  HENT: Negative for congestion and rhinorrhea.   Respiratory: Negative for cough and shortness of breath.   Cardiovascular: Negative for chest pain and leg swelling.  Gastrointestinal: Negative for abdominal pain, nausea and vomiting.  Skin: Negative for color change.  Neurological: Negative for dizziness and weakness.  Psychiatric/Behavioral: Negative for agitation and confusion.       Objective:   Physical Exam Vitals reviewed.  Constitutional:      General: She is not in acute distress. HENT:     Head:  Normocephalic.  Cardiovascular:     Rate and Rhythm: Normal rate and regular rhythm.     Heart sounds: Normal heart sounds. No murmur.  Pulmonary:     Effort: Pulmonary effort is normal.     Breath sounds: Normal breath sounds.  Lymphadenopathy:     Cervical: No cervical adenopathy.  Neurological:     Mental Status: She is alert.  Psychiatric:        Behavior: Behavior normal.    Patient does walk with a cane with slightly hesitant gait with some bending in her lower back       Assessment & Plan:  1. Dysuria Urine under the microscope looks good - POCT Urinalysis Dip Manual  2. Aortic valve stenosis, etiology of cardiac valve disease unspecified Has dyspnea with aortic valve issues referral to cardiology because of diastolic dysfunction as well as LVH and also aortic stenosis plus also elevated pulmonary pressures - Ambulatory referral to Cardiology  3. Dyspnea on exertion Referral to cardiology - Ambulatory referral to Cardiology  4. Chronic bilateral low back pain, unspecified whether sciatica present Walks with a cane is at risk of falling discussion held regarding minimizing risk of falls  5. Cognitive dysfunction Significant cognitive dysfunction currently right now lives by herself but this may change in the future if she gets worse I doubt that this is Alzheimer's I think this is related to strokes I believe the patient is capable of driving locally  within [facility] for daytime driving only  6. History of stroke Please see above we will do our best to keep blood pressure sugars cholesterol in check patient does not tolerate statins  Patient uses Xanax because of extreme anxiety related issues she has been counseled to cut it down to 0.25 mg no more than 3 times a day  Follow-up in 2 to 3 months

## 2019-11-20 NOTE — Telephone Encounter (Signed)
Please set up patient to follow-up in 2 to 3 months please notify her daughter [name]

## 2019-11-21 NOTE — Progress Notes (Signed)
Left message to return call 

## 2019-11-22 ENCOUNTER — Telehealth: Payer: Self-pay | Admitting: Cardiology

## 2019-11-22 DIAGNOSIS — I119 Hypertensive heart disease without heart failure: Secondary | ICD-10-CM | POA: Diagnosis not present

## 2019-11-22 DIAGNOSIS — I083 Combined rheumatic disorders of mitral, aortic and tricuspid valves: Secondary | ICD-10-CM | POA: Diagnosis not present

## 2019-11-22 DIAGNOSIS — N390 Urinary tract infection, site not specified: Secondary | ICD-10-CM | POA: Diagnosis not present

## 2019-11-22 DIAGNOSIS — B9620 Unspecified Escherichia coli [E. coli] as the cause of diseases classified elsewhere: Secondary | ICD-10-CM | POA: Diagnosis not present

## 2019-11-22 DIAGNOSIS — I252 Old myocardial infarction: Secondary | ICD-10-CM | POA: Diagnosis not present

## 2019-11-22 DIAGNOSIS — I4819 Other persistent atrial fibrillation: Secondary | ICD-10-CM | POA: Diagnosis not present

## 2019-11-22 NOTE — Progress Notes (Signed)
Pt daughter [name] returned call and verbalized understanding.   [name] states that provider was going to take her and her husband Etta Quill) as patients. Please advise. Thank you.

## 2019-11-22 NOTE — Telephone Encounter (Signed)
New message  Patient calling the office for samples of medication:   1.  What medication and dosage are you requesting samples for? Xarelto 15 mg  2.  Are you currently out of this medication? Yes

## 2019-11-22 NOTE — Telephone Encounter (Signed)
Medication Samples have been provided to the patient.  Drug name: Xarelto       Strength: 15 mg        Qty: 2 bottles  LOT: [id number]  Exp.Date: 03/23   The patient has been instructed regarding the correct time, dose, and frequency of taking this medication, including desired effects and most common side effects.   Becky Baird 8:59 AM 11/22/2019

## 2019-11-22 NOTE — Progress Notes (Signed)
Left message to return call. Note passed along to front

## 2019-11-22 NOTE — Progress Notes (Signed)
I will accept them as patients please write them down and pass that along to the front-feel free to notify family that would I will take them on as patient's

## 2019-11-25 ENCOUNTER — Inpatient Hospital Stay: Payer: Medicare Other | Admitting: Family Medicine

## 2019-11-25 ENCOUNTER — Telehealth: Payer: Self-pay | Admitting: Cardiology

## 2019-11-25 DIAGNOSIS — I4819 Other persistent atrial fibrillation: Secondary | ICD-10-CM | POA: Diagnosis not present

## 2019-11-25 DIAGNOSIS — I083 Combined rheumatic disorders of mitral, aortic and tricuspid valves: Secondary | ICD-10-CM | POA: Diagnosis not present

## 2019-11-25 DIAGNOSIS — N390 Urinary tract infection, site not specified: Secondary | ICD-10-CM | POA: Diagnosis not present

## 2019-11-25 DIAGNOSIS — I252 Old myocardial infarction: Secondary | ICD-10-CM | POA: Diagnosis not present

## 2019-11-25 DIAGNOSIS — I119 Hypertensive heart disease without heart failure: Secondary | ICD-10-CM | POA: Diagnosis not present

## 2019-11-25 DIAGNOSIS — B9620 Unspecified Escherichia coli [E. coli] as the cause of diseases classified elsewhere: Secondary | ICD-10-CM | POA: Diagnosis not present

## 2019-11-25 MED ORDER — RIVAROXABAN 15 MG PO TABS: 15.0000 mg | ORAL_TABLET | Freq: Every day | ORAL | 0 refills | Status: DC

## 2019-11-25 NOTE — Telephone Encounter (Signed)
Patient calling the office for samples of medication:   1.  What medication and dosage are you requesting samples for?  XARELTO 15 MG   2.  Are you currently out of this medication? Patient states that she was told to go to  [place] office on Friday to pick up. Unable to drive.

## 2019-11-25 NOTE — Telephone Encounter (Signed)
Pt will come by office to pick up samples  

## 2019-11-28 DIAGNOSIS — I4819 Other persistent atrial fibrillation: Secondary | ICD-10-CM | POA: Diagnosis not present

## 2019-11-28 DIAGNOSIS — I083 Combined rheumatic disorders of mitral, aortic and tricuspid valves: Secondary | ICD-10-CM | POA: Diagnosis not present

## 2019-11-28 DIAGNOSIS — N390 Urinary tract infection, site not specified: Secondary | ICD-10-CM | POA: Diagnosis not present

## 2019-11-28 DIAGNOSIS — I119 Hypertensive heart disease without heart failure: Secondary | ICD-10-CM | POA: Diagnosis not present

## 2019-11-28 DIAGNOSIS — I252 Old myocardial infarction: Secondary | ICD-10-CM | POA: Diagnosis not present

## 2019-11-28 DIAGNOSIS — B9620 Unspecified Escherichia coli [E. coli] as the cause of diseases classified elsewhere: Secondary | ICD-10-CM | POA: Diagnosis not present

## 2019-12-02 ENCOUNTER — Telehealth: Payer: Self-pay | Admitting: Cardiology

## 2019-12-02 NOTE — Telephone Encounter (Signed)
Left message on VM for Becky Baird (on DPR) - per policy - at this time only (1) person is allowed if pt needs assistance with ambulation or has trouble with cognitive function.   That person will be screen and temperature taken.  Advised if more than 1 person needs to be included they should bring a cell phone and call that person on speaker phone during the office visit.  Requested she c/b if any further questions or concerns.

## 2019-12-02 NOTE — Telephone Encounter (Signed)
Patient's daughter, [name], is requesting for her and her sister to accompany their mother to her appt on 12/04/19 with Dr. Percival Spanish in [place]. She states that her mother has cognitive issues.

## 2019-12-03 ENCOUNTER — Telehealth: Payer: Self-pay | Admitting: Cardiology

## 2019-12-03 DIAGNOSIS — N390 Urinary tract infection, site not specified: Secondary | ICD-10-CM | POA: Diagnosis not present

## 2019-12-03 DIAGNOSIS — I252 Old myocardial infarction: Secondary | ICD-10-CM | POA: Diagnosis not present

## 2019-12-03 DIAGNOSIS — I6529 Occlusion and stenosis of unspecified carotid artery: Secondary | ICD-10-CM | POA: Insufficient documentation

## 2019-12-03 DIAGNOSIS — I4819 Other persistent atrial fibrillation: Secondary | ICD-10-CM | POA: Diagnosis not present

## 2019-12-03 DIAGNOSIS — I119 Hypertensive heart disease without heart failure: Secondary | ICD-10-CM | POA: Diagnosis not present

## 2019-12-03 DIAGNOSIS — B9620 Unspecified Escherichia coli [E. coli] as the cause of diseases classified elsewhere: Secondary | ICD-10-CM | POA: Diagnosis not present

## 2019-12-03 DIAGNOSIS — I083 Combined rheumatic disorders of mitral, aortic and tricuspid valves: Secondary | ICD-10-CM | POA: Diagnosis not present

## 2019-12-03 NOTE — Progress Notes (Signed)
Cardiology Office Note   Date:  12/04/2019   ID:  Becky Baird, DOB [birth year], MRN [id number]  PCP:  Kathyrn Drown, MD  Cardiologist:   Minus Breeding, MD   Chief Complaint  Patient presents with  . Aortic Stenosis      History of Present Illness: Becky Baird is a 86 y.o. female who presents for follow up of PAF and previous stroke.  Since I last saw her she was in the hospital with ataxia.  I reviewed these records for this visit.  This was thought ultimately to be related to urinary infection.  She did have an echocardiogram for follow-up of murmur as well as work-up of this complaint.  She has some mild aortic stenosis that might be moderate but really not much change from 2019.  The mean gradient had been 16 then and was 20 now.  She does have some mild shortness of breath with activity but has had some weakness and fatigue since she got out of the hospital.  She is working with physical therapy.  She is bothered by hip pain.  She has not had any overt resting shortness of breath, PND or orthopnea.  Is been no palpitations, presyncope or syncope.  She is not reporting chest discomfort.  She gets around with a cane.    Past Medical History:  Diagnosis Date  . Anxiety   . Chronic back pain   . Hyperlipidemia   . Hypertension   . Memory loss   . Mild cognitive impairment, so stated   . Occlusion and stenosis of carotid artery with cerebral infarction    Bilateral less than 50% carotid stenosis.  . Persistent atrial fibrillation ([place]) 06/23/2016  . Renal cyst   . Sciatica   . Stroke ([place])    2010, RMCA with stent  . Toe fracture 11/2015   R toe  . Ulcer 1970s    Past Surgical History:  Procedure Laterality Date  . BIOPSY  04/25/2018   Procedure: BIOPSY;  Surgeon: Rogene Houston, MD;  [facility];  Service: Endoscopy;;  gastric polyps  . COLONOSCOPY  03/10/2011   Procedure: COLONOSCOPY;  Surgeon: Rogene Houston, MD;  [facility];   Service: Endoscopy;  Laterality: N/A;  . COLONOSCOPY N/A 08/24/2016   Procedure: COLONOSCOPY;  Surgeon: Rogene Houston, MD;  [facility];  Service: Endoscopy;  Laterality: N/A;  930  . DILATION AND CURETTAGE OF UTERUS     years ago  . ESOPHAGOGASTRODUODENOSCOPY N/A 04/25/2018   Procedure: ESOPHAGOGASTRODUODENOSCOPY (EGD);  Surgeon: Rogene Houston, MD;  [facility];  Service: Endoscopy;  Laterality: N/A;  1:15  . OVARIAN CYST REMOVAL     at least 10 years ago  . POLYPECTOMY  08/24/2016   Procedure: POLYPECTOMY;  Surgeon: Rogene Houston, MD;  [facility];  Service: Endoscopy;;  ascending colon; hepatic flexure  . TONSILLECTOMY     childhood     Current Outpatient Medications  Medication Sig Dispense Refill  . ALPRAZolam (XANAX) 0.25 MG tablet Take 1 tablet at lunchtime PRN, 1 tablet afternoon PRN, 1 tablet at bedtime PRN 90 tablet 3  . clobetasol ointment (TEMOVATE) [id number] % Apply 1 application topically 2 (two) times daily as needed.    Marland Kitchen estradiol (ESTRACE) 0.1 MG/GM vaginal cream Place 1 Applicatorful vaginally once a week.    . feeding supplement (BOOST HIGH PROTEIN) LIQD Take 1 Container by mouth 3 (three) times  daily between meals.    . feeding supplement, GLUCERNA SHAKE, (GLUCERNA SHAKE) LIQD Take 237 mLs by mouth 3 (three) times daily between meals.    . fish oil-omega-3 fatty acids 1000 MG capsule Take 2 g by mouth 2 (two) times a day.     . hydrochlorothiazide (HYDRODIURIL) 12.5 MG tablet TAKE ONE TABLET BY MOUTH DAILY 30 tablet 1  . Iron-FA-B Cmp-C-Biot-Probiotic (FUSION PLUS) CAPS Take 1 capsule by mouth every morning.   3  . Ketotifen Fumarate (ALAWAY OP) Apply 1 drop to eye daily as needed.    . loratadine (CLARITIN) 10 MG tablet Take 1 tablet (10 mg total) by mouth daily. (Patient taking differently: Take 10 mg by mouth at bedtime. ) 30 tablet 5  . MYRBETRIQ 50 MG TB24 tablet Take 50 mg by mouth daily.    Marland Kitchen OVER THE COUNTER MEDICATION Take  1 capsule by mouth every morning. Noel    . OVER THE COUNTER MEDICATION Take by mouth daily. Vitamin d3     . pantoprazole (PROTONIX) 40 MG tablet TAKE ONE TABLET BY MOUTH DAILY 30 tablet 2  . potassium chloride (KLOR-CON) 10 MEQ tablet TAKE ONE TABLET BY MOUTH TWICE DAILY 60 tablet 2  . Red Yeast Rice 600 MG CAPS Take 1,200 mg by mouth 2 (two) times a day.     . Rivaroxaban (XARELTO) 15 MG TABS tablet Take 1 tablet (15 mg total) by mouth daily with supper. 35 tablet 0  . sodium chloride (OCEAN) 0.65 % nasal spray Place 1 spray into the nose as needed for congestion.      No current facility-administered medications for this visit.    Allergies:   Amlodipine, Niaspan [niacin er], and Oxybutynin    ROS:  Please see the history of present illness.   Otherwise, review of systems are positive for none.   All other systems are reviewed and negative.    PHYSICAL EXAM: VS:  BP 140/70   Pulse (!) 56   Ht 5\' 7"  (1.702 m)   Wt 135 lb (61.2 kg)   BMI 21.14 kg/m  , BMI Body mass index is 21.14 kg/m. GENERAL: Slightly frail appearing NECK:  No jugular venous distention, waveform within normal limits, carotid upstroke brisk and symmetric, no bruits, no thyromegaly LUNGS:  Clear to auscultation bilaterally CHEST:  Unremarkable HEART:  PMI not displaced or sustained,S1 and S2 within normal limits, no S3, no S4, no clicks, no rubs, 3 out of 6 mid peaking systolic murmur radiating up the aortic outflow tract, no diastolic murmurs ABD:  Flat, positive bowel sounds normal in frequency in pitch, no bruits, no rebound, no guarding, no midline pulsatile mass, no hepatomegaly, no splenomegaly EXT:  2 plus pulses throughout, no edema, no cyanosis no clubbing   EKG:  EKG is not ordered today. The ekg ordered for 11/09/2019 demonstrates sinus rhythm, rate 58, axis within normal limits, intervals within normal limits, poor anterior R wave progression, no acute ST-T wave changes.   Recent  Labs: 11/10/2019: ALT 20; BUN 27; Creatinine, Ser 1.33; Hemoglobin 11.7; Platelets 200; Potassium 3.6; Sodium 132    Lipid Panel    Component Value Date/Time   CHOL 176 11/10/2019 0000   CHOL 160 11/23/2017 0930   TRIG 65 11/10/2019 0000   HDL 64 11/10/2019 0000   HDL 63 11/23/2017 0930   CHOLHDL 2.8 11/10/2019 0000   VLDL 13 11/10/2019 0000   LDLCALC 99 11/10/2019 0000   LDLCALC 84 11/23/2017 0930  Wt Readings from Last 3 Encounters:  12/04/19 135 lb (61.2 kg)  11/09/19 126 lb 15.8 oz (57.6 kg)  12/13/18 127 lb (57.6 kg)      Other studies Reviewed: Additional studies/ records that were reviewed today include: Hospital records. Review of the above records demonstrates:  Please see elsewhere in the note.     ASSESSMENT AND PLAN:  PAF:Ms.Annakatherine Chernoff Wrighthas a CHA2DS2 - VASc score of 6.    I have reviewed the dosing of her Xarelto.    No change in therapy  HTN:The blood pressure is  controlled.  No change in therapy.    AORTIC STENOSIS: This is moderate and unchanged.  I will repeat an echo in 1 year.  No change in therapy.  CAROTID STENOSIS:  This is mild previously.  I reviewed the MRI that she had when she was in the hospital recently.  No further testing.   FATIGUE: This is likely related to the hospitalization and a slow recovery but I will check a TSH.  She was very minimally anemic.  She had a very mild hyponatremia.  She is having follow-up labs per Kathyrn Drown, MD   COVID EDUCATION: She has been vaccinated.  Current medicines are reviewed at length with the patient today.  The patient does not have concerns regarding medicines.  The following changes have been made:  no change  Labs/ tests ordered today include:   Orders Placed This Encounter  Procedures  . TSH  . ECHOCARDIOGRAM COMPLETE     Disposition:   FU with me in one year.     Signed, Minus Breeding, MD  12/04/2019 3:17 PM    [facility]

## 2019-12-03 NOTE — Telephone Encounter (Signed)
Spoke to patient's daughter [name].She stated she wanted to ask Dr.Hochrein if her and her sister can both be present at visit tomorrow at [facility] office.Stated her sister has a hearing impairment and it is hard for her hear over the phone.Advised of [facility] policy.She still wanted to ask Dr.Hochrein.Message sent to [place].

## 2019-12-03 NOTE — Telephone Encounter (Signed)
Yes. Thanks 

## 2019-12-03 NOTE — Telephone Encounter (Signed)
New message   Patient's daughter wants top discuss her and her sister coming in with the patient to the Appt with Dr. Percival Spanish. Please call.

## 2019-12-03 NOTE — Telephone Encounter (Signed)
Spoke to patient's daughter [name] Dr.Hochrein agreed [facility] policy 1 person is allowed to come back with patient.

## 2019-12-04 ENCOUNTER — Encounter: Payer: Self-pay | Admitting: Cardiology

## 2019-12-04 ENCOUNTER — Ambulatory Visit (INDEPENDENT_AMBULATORY_CARE_PROVIDER_SITE_OTHER): Payer: Medicare Other | Admitting: Cardiology

## 2019-12-04 ENCOUNTER — Other Ambulatory Visit: Payer: Self-pay

## 2019-12-04 VITALS — BP 140/70 | HR 56 | Ht 67.0 in | Wt 135.0 lb

## 2019-12-04 DIAGNOSIS — I10 Essential (primary) hypertension: Secondary | ICD-10-CM

## 2019-12-04 DIAGNOSIS — I350 Nonrheumatic aortic (valve) stenosis: Secondary | ICD-10-CM

## 2019-12-04 DIAGNOSIS — I6529 Occlusion and stenosis of unspecified carotid artery: Secondary | ICD-10-CM

## 2019-12-04 DIAGNOSIS — R001 Bradycardia, unspecified: Secondary | ICD-10-CM | POA: Diagnosis not present

## 2019-12-04 DIAGNOSIS — I480 Paroxysmal atrial fibrillation: Secondary | ICD-10-CM

## 2019-12-04 DIAGNOSIS — Z7189 Other specified counseling: Secondary | ICD-10-CM

## 2019-12-04 NOTE — Patient Instructions (Addendum)
Medication Instructions:  The current medical regimen is effective;  continue present plan and medications.  *If you need a refill on your cardiac medications before your next appointment, please call your pharmacy*  Lab Work: Please have blood work (TSH) If you have labs (blood work) drawn today and your tests are completely normal, you will receive your results only by: Marland Kitchen MyChart Message (if you have MyChart) OR . A paper copy in the mail If you have any lab test that is abnormal or we need to change your treatment, we will call you to review the results.  Testing/Procedures: Your physician has requested that you have an echocardiogram in 1 year. Echocardiography is a painless test that uses sound waves to create images of your heart. It provides your doctor with information about the size and shape of your heart and how well your heart's chambers and valves are working. This procedure takes approximately one hour. There are no restrictions for this procedure.  Follow-Up: At [facility], you and your health needs are our priority.  As part of our continuing mission to provide you with exceptional heart care, we have created designated Provider Care Teams.  These Care Teams include your primary Cardiologist (physician) and Advanced Practice Providers (APPs -  Physician Assistants and Nurse Practitioners) who all work together to provide you with the care you need, when you need it.  We recommend signing up for the patient portal called "MyChart".  Sign up information is provided on this After Visit Summary.  MyChart is used to connect with patients for Virtual Visits (Telemedicine).  Patients are able to view lab/test results, encounter notes, upcoming appointments, etc.  Non-urgent messages can be sent to your provider as well.   To learn more about what you can do with MyChart, go to [email or web address].    Your next appointment:   12 month(s)  The format for your next appointment:    In Person  Provider:   Minus Breeding, MD   Thank you for choosing [facility]!!

## 2019-12-10 ENCOUNTER — Telehealth: Payer: Self-pay | Admitting: Family Medicine

## 2019-12-10 DIAGNOSIS — I119 Hypertensive heart disease without heart failure: Secondary | ICD-10-CM | POA: Diagnosis not present

## 2019-12-10 DIAGNOSIS — B9620 Unspecified Escherichia coli [E. coli] as the cause of diseases classified elsewhere: Secondary | ICD-10-CM | POA: Diagnosis not present

## 2019-12-10 DIAGNOSIS — I252 Old myocardial infarction: Secondary | ICD-10-CM | POA: Diagnosis not present

## 2019-12-10 DIAGNOSIS — N390 Urinary tract infection, site not specified: Secondary | ICD-10-CM | POA: Diagnosis not present

## 2019-12-10 DIAGNOSIS — I083 Combined rheumatic disorders of mitral, aortic and tricuspid valves: Secondary | ICD-10-CM | POA: Diagnosis not present

## 2019-12-10 DIAGNOSIS — I4819 Other persistent atrial fibrillation: Secondary | ICD-10-CM | POA: Diagnosis not present

## 2019-12-10 NOTE — Telephone Encounter (Signed)
Pt would like to know if labs are ordered. She states last visit with Dr. Nicki Reaper he told her to get lab work done 2-3 weeks from that appt.

## 2019-12-10 NOTE — Telephone Encounter (Signed)
Please advise. Thank you

## 2019-12-11 ENCOUNTER — Other Ambulatory Visit: Payer: Self-pay | Admitting: *Deleted

## 2019-12-11 DIAGNOSIS — E871 Hypo-osmolality and hyponatremia: Secondary | ICD-10-CM

## 2019-12-11 NOTE — Telephone Encounter (Signed)
Nurses Please talk with patient Currently I would recommend metabolic 7 because of hyponatremia It also appears that a TSH was ordered by her cardiologist she should get that drawn as well Thank you

## 2019-12-11 NOTE — Telephone Encounter (Signed)
Lab ordered. LM for pr to return call. Please advise pt to notify lab there are two tests needing to be done.

## 2019-12-12 ENCOUNTER — Telehealth: Payer: Self-pay | Admitting: Family Medicine

## 2019-12-12 NOTE — Telephone Encounter (Signed)
Patient needs information update in file for Covid shot she had Moderna Vaccine [id number] shot on 08/20/19 [facility] and second shot 09/17/19 [id number]  [id number]

## 2019-12-12 NOTE — Telephone Encounter (Signed)
Immunizations updated in chart and pt is aware.

## 2019-12-12 NOTE — Telephone Encounter (Signed)
Patient notified

## 2019-12-14 DIAGNOSIS — I083 Combined rheumatic disorders of mitral, aortic and tricuspid valves: Secondary | ICD-10-CM | POA: Diagnosis not present

## 2019-12-14 DIAGNOSIS — I4819 Other persistent atrial fibrillation: Secondary | ICD-10-CM | POA: Diagnosis not present

## 2019-12-14 DIAGNOSIS — Z7901 Long term (current) use of anticoagulants: Secondary | ICD-10-CM | POA: Diagnosis not present

## 2019-12-14 DIAGNOSIS — N390 Urinary tract infection, site not specified: Secondary | ICD-10-CM | POA: Diagnosis not present

## 2019-12-14 DIAGNOSIS — G8929 Other chronic pain: Secondary | ICD-10-CM | POA: Diagnosis not present

## 2019-12-14 DIAGNOSIS — Z86010 Personal history of colonic polyps: Secondary | ICD-10-CM | POA: Diagnosis not present

## 2019-12-14 DIAGNOSIS — I700 Atherosclerosis of aorta: Secondary | ICD-10-CM | POA: Diagnosis not present

## 2019-12-14 DIAGNOSIS — M5430 Sciatica, unspecified side: Secondary | ICD-10-CM | POA: Diagnosis not present

## 2019-12-14 DIAGNOSIS — Z8673 Personal history of transient ischemic attack (TIA), and cerebral infarction without residual deficits: Secondary | ICD-10-CM | POA: Diagnosis not present

## 2019-12-14 DIAGNOSIS — J984 Other disorders of lung: Secondary | ICD-10-CM | POA: Diagnosis not present

## 2019-12-14 DIAGNOSIS — N281 Cyst of kidney, acquired: Secondary | ICD-10-CM | POA: Diagnosis not present

## 2019-12-14 DIAGNOSIS — Z9181 History of falling: Secondary | ICD-10-CM | POA: Diagnosis not present

## 2019-12-14 DIAGNOSIS — B9620 Unspecified Escherichia coli [E. coli] as the cause of diseases classified elsewhere: Secondary | ICD-10-CM | POA: Diagnosis not present

## 2019-12-14 DIAGNOSIS — E871 Hypo-osmolality and hyponatremia: Secondary | ICD-10-CM | POA: Diagnosis not present

## 2019-12-14 DIAGNOSIS — G3184 Mild cognitive impairment, so stated: Secondary | ICD-10-CM | POA: Diagnosis not present

## 2019-12-14 DIAGNOSIS — I252 Old myocardial infarction: Secondary | ICD-10-CM | POA: Diagnosis not present

## 2019-12-14 DIAGNOSIS — E785 Hyperlipidemia, unspecified: Secondary | ICD-10-CM | POA: Diagnosis not present

## 2019-12-14 DIAGNOSIS — R7303 Prediabetes: Secondary | ICD-10-CM | POA: Diagnosis not present

## 2019-12-14 DIAGNOSIS — M479 Spondylosis, unspecified: Secondary | ICD-10-CM | POA: Diagnosis not present

## 2019-12-14 DIAGNOSIS — K219 Gastro-esophageal reflux disease without esophagitis: Secondary | ICD-10-CM | POA: Diagnosis not present

## 2019-12-14 DIAGNOSIS — I119 Hypertensive heart disease without heart failure: Secondary | ICD-10-CM | POA: Diagnosis not present

## 2019-12-14 DIAGNOSIS — F419 Anxiety disorder, unspecified: Secondary | ICD-10-CM | POA: Diagnosis not present

## 2019-12-16 ENCOUNTER — Telehealth: Payer: Medicare Other | Admitting: Family Medicine

## 2019-12-16 ENCOUNTER — Telehealth: Payer: Self-pay | Admitting: Cardiology

## 2019-12-16 DIAGNOSIS — I480 Paroxysmal atrial fibrillation: Secondary | ICD-10-CM

## 2019-12-16 DIAGNOSIS — E871 Hypo-osmolality and hyponatremia: Secondary | ICD-10-CM | POA: Diagnosis not present

## 2019-12-16 NOTE — Telephone Encounter (Signed)
New Message    [facility] is calling, pt is there for labs and they do not have orders     Please advise

## 2019-12-16 NOTE — Telephone Encounter (Signed)
Office note from 12/04/19 indicates patient to have TSH checked.  Order for TSH released.I spoke with Felease at [employer] and she is able to see this order now

## 2019-12-17 DIAGNOSIS — I252 Old myocardial infarction: Secondary | ICD-10-CM | POA: Diagnosis not present

## 2019-12-17 DIAGNOSIS — B9620 Unspecified Escherichia coli [E. coli] as the cause of diseases classified elsewhere: Secondary | ICD-10-CM | POA: Diagnosis not present

## 2019-12-17 DIAGNOSIS — I4819 Other persistent atrial fibrillation: Secondary | ICD-10-CM | POA: Diagnosis not present

## 2019-12-17 DIAGNOSIS — I083 Combined rheumatic disorders of mitral, aortic and tricuspid valves: Secondary | ICD-10-CM | POA: Diagnosis not present

## 2019-12-17 DIAGNOSIS — N390 Urinary tract infection, site not specified: Secondary | ICD-10-CM | POA: Diagnosis not present

## 2019-12-17 DIAGNOSIS — I119 Hypertensive heart disease without heart failure: Secondary | ICD-10-CM | POA: Diagnosis not present

## 2019-12-17 LAB — BASIC METABOLIC PANEL
BUN/Creatinine Ratio: 17 (ref 12–28)
BUN: 24 mg/dL (ref 8–27)
CO2: 22 mmol/L (ref 20–29)
Calcium: 10.4 mg/dL — ABNORMAL HIGH (ref 8.7–10.3)
Chloride: 101 mmol/L (ref 96–106)
Creatinine, Ser: 1.39 mg/dL — ABNORMAL HIGH (ref 0.57–1.00)
GFR calc Af Amer: 40 mL/min/{1.73_m2} — ABNORMAL LOW (ref >59)
GFR calc non Af Amer: 35 mL/min/{1.73_m2} — ABNORMAL LOW (ref >59)
Glucose: 90 mg/dL (ref 65–99)
Potassium: 4.4 mmol/L (ref 3.5–5.2)
Sodium: 139 mmol/L (ref 134–144)

## 2019-12-17 LAB — TSH: TSH: 3.55 u[IU]/mL (ref 0.450–4.500)

## 2019-12-18 ENCOUNTER — Inpatient Hospital Stay: Payer: Medicare Other | Admitting: Family Medicine

## 2019-12-24 DIAGNOSIS — I252 Old myocardial infarction: Secondary | ICD-10-CM | POA: Diagnosis not present

## 2019-12-24 DIAGNOSIS — N390 Urinary tract infection, site not specified: Secondary | ICD-10-CM | POA: Diagnosis not present

## 2019-12-24 DIAGNOSIS — I4819 Other persistent atrial fibrillation: Secondary | ICD-10-CM | POA: Diagnosis not present

## 2019-12-24 DIAGNOSIS — B9620 Unspecified Escherichia coli [E. coli] as the cause of diseases classified elsewhere: Secondary | ICD-10-CM | POA: Diagnosis not present

## 2019-12-24 DIAGNOSIS — I083 Combined rheumatic disorders of mitral, aortic and tricuspid valves: Secondary | ICD-10-CM | POA: Diagnosis not present

## 2019-12-24 DIAGNOSIS — I119 Hypertensive heart disease without heart failure: Secondary | ICD-10-CM | POA: Diagnosis not present

## 2019-12-30 ENCOUNTER — Ambulatory Visit (INDEPENDENT_AMBULATORY_CARE_PROVIDER_SITE_OTHER): Payer: Medicare Other | Admitting: Family Medicine

## 2019-12-30 ENCOUNTER — Encounter: Payer: Self-pay | Admitting: Family Medicine

## 2019-12-30 ENCOUNTER — Other Ambulatory Visit: Payer: Self-pay

## 2019-12-30 VITALS — BP 130/64 | Temp 98.2°F | Wt 136.8 lb

## 2019-12-30 DIAGNOSIS — I10 Essential (primary) hypertension: Secondary | ICD-10-CM

## 2019-12-30 DIAGNOSIS — R7303 Prediabetes: Secondary | ICD-10-CM

## 2019-12-30 DIAGNOSIS — I6529 Occlusion and stenosis of unspecified carotid artery: Secondary | ICD-10-CM | POA: Diagnosis not present

## 2019-12-30 DIAGNOSIS — E7849 Other hyperlipidemia: Secondary | ICD-10-CM | POA: Diagnosis not present

## 2019-12-30 NOTE — Progress Notes (Signed)
   Subjective:    Patient ID: Becky Baird, female    DOB: [birth year], 86 y.o.   MRN: [id number]  HPI Patient comes in today for follow up appointment.  Patient doing well patient feels that she is doing well cognitively.  Doing the best she can getting around.  Using a cane.  Denies any falls. Patient having some problems with callus's on her toes and her pinky toes starting to curl under but is seeing Dr. Blanch Media about this.  We did discuss her previous lab work need to do some follow-up labs in October. No concerns.   Review of Systems  Constitutional: Negative for activity change, appetite change and fatigue.  HENT: Negative for congestion and rhinorrhea.   Respiratory: Negative for cough and shortness of breath.   Cardiovascular: Negative for chest pain and leg swelling.  Gastrointestinal: Negative for abdominal pain and diarrhea.  Endocrine: Negative for polydipsia and polyphagia.  Skin: Negative for color change.  Neurological: Negative for dizziness and weakness.  Psychiatric/Behavioral: Negative for behavioral problems and confusion.       Objective:   Physical Exam Vitals reviewed.  Constitutional:      General: She is not in acute distress. HENT:     Head: Normocephalic and atraumatic.  Eyes:     General:        Right eye: No discharge.        Left eye: No discharge.  Neck:     Trachea: No tracheal deviation.  Cardiovascular:     Rate and Rhythm: Normal rate.     Heart sounds: Normal heart sounds. No murmur.  Pulmonary:     Effort: Pulmonary effort is normal. No respiratory distress.     Breath sounds: Normal breath sounds.  Lymphadenopathy:     Cervical: No cervical adenopathy.  Skin:    General: Skin is warm and dry.  Neurological:     Mental Status: She is alert.     Coordination: Coordination normal.  Psychiatric:        Behavior: Behavior normal.     Patient to do follow-up in October with labs      Assessment & Plan:  1. Essential  hypertension Blood pressure good control continue current measures   - Basic metabolic panel  2. Hypercalcemia Slight elevation of calcium will recheck metabolic 7 await the results  3. Prediabetes Minimal issues with this watch starches in diet  4. Other hyperlipidemia Continue to minimize fats in the diet patient does not tolerate statins Does take red yeast rice - Lipid panel Cognitive stable daytime driving only

## 2019-12-31 ENCOUNTER — Other Ambulatory Visit: Payer: Self-pay | Admitting: Family Medicine

## 2019-12-31 NOTE — Telephone Encounter (Signed)
Seen yesterday for a check up

## 2019-12-31 NOTE — Telephone Encounter (Signed)
6 months on meds

## 2020-01-02 DIAGNOSIS — M79675 Pain in left toe(s): Secondary | ICD-10-CM | POA: Diagnosis not present

## 2020-01-02 DIAGNOSIS — M79671 Pain in right foot: Secondary | ICD-10-CM | POA: Diagnosis not present

## 2020-01-02 DIAGNOSIS — L110 Acquired keratosis follicularis: Secondary | ICD-10-CM | POA: Diagnosis not present

## 2020-01-02 DIAGNOSIS — M79674 Pain in right toe(s): Secondary | ICD-10-CM | POA: Diagnosis not present

## 2020-01-02 DIAGNOSIS — M79672 Pain in left foot: Secondary | ICD-10-CM | POA: Diagnosis not present

## 2020-01-02 DIAGNOSIS — I739 Peripheral vascular disease, unspecified: Secondary | ICD-10-CM | POA: Diagnosis not present

## 2020-01-13 ENCOUNTER — Telehealth: Payer: Self-pay | Admitting: Cardiology

## 2020-01-13 MED ORDER — RIVAROXABAN 15 MG PO TABS: 15.0000 mg | ORAL_TABLET | Freq: Every day | ORAL | 0 refills | Status: DC

## 2020-01-13 NOTE — Telephone Encounter (Signed)
Patient calling the office for samples of medication:   1.  What medication and dosage are you requesting samples for? XARELTO 15 MG   2.  Are you currently out of this medication? YES

## 2020-01-13 NOTE — Telephone Encounter (Signed)
Pt will come by [place] office for samples

## 2020-01-29 ENCOUNTER — Other Ambulatory Visit: Payer: Self-pay | Admitting: Cardiology

## 2020-01-29 ENCOUNTER — Telehealth: Payer: Self-pay | Admitting: Family Medicine

## 2020-01-29 DIAGNOSIS — E871 Hypo-osmolality and hyponatremia: Secondary | ICD-10-CM

## 2020-01-29 DIAGNOSIS — E7849 Other hyperlipidemia: Secondary | ICD-10-CM

## 2020-01-29 DIAGNOSIS — D509 Iron deficiency anemia, unspecified: Secondary | ICD-10-CM

## 2020-01-29 NOTE — Telephone Encounter (Signed)
Nurses please redo the lab work that was ordered earlier I recommend lipid, met 7, CBC, ferritin, TIBC-patient should do this lab work by fall time before her follow-up visit thank you Diagnosis iron deficient anemia, hyperlipidemia, hyponatremia  The reason for this change is because of her being on the multi supplement including iron tablet

## 2020-01-29 NOTE — Telephone Encounter (Signed)
Patient on supplement will need ferritin TIBC this summer/fall

## 2020-01-30 NOTE — Telephone Encounter (Signed)
Blood work ordered in [facility]. Patient notified and scheduled follow up office visit with Dr Nicki Reaper in the fall

## 2020-01-30 NOTE — Addendum Note (Signed)
Addended by: Dairl Ponder on: 01/30/2020 11:14 AM   Modules accepted: Orders

## 2020-02-17 ENCOUNTER — Telehealth: Payer: Self-pay | Admitting: Cardiology

## 2020-02-17 MED ORDER — RIVAROXABAN 15 MG PO TABS: ORAL_TABLET | ORAL | 0 refills | Status: DC

## 2020-02-17 NOTE — Telephone Encounter (Signed)
Aware that samples available for pick up.

## 2020-02-17 NOTE — Telephone Encounter (Signed)
Patient calling the office for samples of medication:   1. What medication and dosage are you requesting samples for? XARELTO 15 MG   2. Are you currentlyout of this medication? YES

## 2020-02-26 ENCOUNTER — Other Ambulatory Visit: Payer: Self-pay | Admitting: Family Medicine

## 2020-03-12 DIAGNOSIS — M79672 Pain in left foot: Secondary | ICD-10-CM | POA: Diagnosis not present

## 2020-03-12 DIAGNOSIS — I739 Peripheral vascular disease, unspecified: Secondary | ICD-10-CM | POA: Diagnosis not present

## 2020-03-12 DIAGNOSIS — M79671 Pain in right foot: Secondary | ICD-10-CM | POA: Diagnosis not present

## 2020-03-12 DIAGNOSIS — M79674 Pain in right toe(s): Secondary | ICD-10-CM | POA: Diagnosis not present

## 2020-03-12 DIAGNOSIS — M79675 Pain in left toe(s): Secondary | ICD-10-CM | POA: Diagnosis not present

## 2020-03-12 DIAGNOSIS — L110 Acquired keratosis follicularis: Secondary | ICD-10-CM | POA: Diagnosis not present

## 2020-03-23 ENCOUNTER — Telehealth: Payer: Self-pay | Admitting: Cardiology

## 2020-03-23 NOTE — Telephone Encounter (Signed)
Pt aware that we do not have samples of Xarelto 15 mg at this office but that Barry had 1 bottle - daughter will pick up from that office and nursing staff made aware

## 2020-03-23 NOTE — Telephone Encounter (Signed)
Pt would like to know if she can get some samples of Rivaroxaban (XARELTO) 15 MG TABS tablet [[id number]]    Please call [phone]

## 2020-04-22 ENCOUNTER — Telehealth: Payer: Self-pay | Admitting: Cardiology

## 2020-04-22 NOTE — Telephone Encounter (Signed)
Would like to know if she can have samples of Rivaroxaban (XARELTO) 15 MG TABS tablet [[id number]]   [phone]

## 2020-04-22 NOTE — Telephone Encounter (Signed)
Advised that xarelto 15 mg is unavailable.

## 2020-04-24 DIAGNOSIS — E7849 Other hyperlipidemia: Secondary | ICD-10-CM | POA: Diagnosis not present

## 2020-04-24 DIAGNOSIS — D509 Iron deficiency anemia, unspecified: Secondary | ICD-10-CM | POA: Diagnosis not present

## 2020-04-24 DIAGNOSIS — E871 Hypo-osmolality and hyponatremia: Secondary | ICD-10-CM | POA: Diagnosis not present

## 2020-04-25 LAB — CBC WITH DIFFERENTIAL/PLATELET
Basophils Absolute: 0 10*3/uL (ref 0.0–0.2)
Basos: 1 %
EOS (ABSOLUTE): 0.2 10*3/uL (ref 0.0–0.4)
Eos: 3 %
Hematocrit: 38.2 % (ref 34.0–46.6)
Hemoglobin: 12.8 g/dL (ref 11.1–15.9)
Immature Grans (Abs): 0 10*3/uL (ref 0.0–0.1)
Immature Granulocytes: 0 %
Lymphocytes Absolute: 1.6 10*3/uL (ref 0.7–3.1)
Lymphs: 27 %
MCH: 31.3 pg (ref 26.6–33.0)
MCHC: 33.5 g/dL (ref 31.5–35.7)
MCV: 93 fL (ref 79–97)
Monocytes Absolute: 0.6 10*3/uL (ref 0.1–0.9)
Monocytes: 11 %
Neutrophils Absolute: 3.6 10*3/uL (ref 1.4–7.0)
Neutrophils: 58 %
Platelets: 253 10*3/uL (ref 150–450)
RBC: 4.09 x10E6/uL (ref 3.77–5.28)
RDW: 12.4 % (ref 11.7–15.4)
WBC: 6 10*3/uL (ref 3.4–10.8)

## 2020-04-25 LAB — BASIC METABOLIC PANEL
BUN/Creatinine Ratio: 16 (ref 12–28)
BUN: 23 mg/dL (ref 8–27)
CO2: 24 mmol/L (ref 20–29)
Calcium: 9.9 mg/dL (ref 8.7–10.3)
Chloride: 101 mmol/L (ref 96–106)
Creatinine, Ser: 1.46 mg/dL — ABNORMAL HIGH (ref 0.57–1.00)
GFR calc Af Amer: 38 mL/min/{1.73_m2} — ABNORMAL LOW (ref >59)
GFR calc non Af Amer: 33 mL/min/{1.73_m2} — ABNORMAL LOW (ref >59)
Glucose: 86 mg/dL (ref 65–99)
Potassium: 4.3 mmol/L (ref 3.5–5.2)
Sodium: 143 mmol/L (ref 134–144)

## 2020-04-25 LAB — LIPID PANEL
Chol/HDL Ratio: 2.8 ratio (ref 0.0–4.4)
Cholesterol, Total: 202 mg/dL — ABNORMAL HIGH (ref 100–199)
HDL: 71 mg/dL (ref >39)
LDL Chol Calc (NIH): 121 mg/dL — ABNORMAL HIGH (ref 0–99)
Triglycerides: 56 mg/dL (ref 0–149)
VLDL Cholesterol Cal: 10 mg/dL (ref 5–40)

## 2020-04-25 LAB — FERRITIN: Ferritin: 490 ng/mL — ABNORMAL HIGH (ref 15–150)

## 2020-04-25 LAB — IRON AND TIBC
Iron Saturation: 36 % (ref 15–55)
Iron: 89 ug/dL (ref 27–139)
Total Iron Binding Capacity: 250 ug/dL (ref 250–450)
UIBC: 161 ug/dL (ref 118–369)

## 2020-04-27 ENCOUNTER — Other Ambulatory Visit: Payer: Self-pay | Admitting: Family Medicine

## 2020-04-27 DIAGNOSIS — N189 Chronic kidney disease, unspecified: Secondary | ICD-10-CM

## 2020-04-28 ENCOUNTER — Ambulatory Visit (INDEPENDENT_AMBULATORY_CARE_PROVIDER_SITE_OTHER): Payer: Medicare Other | Admitting: Family Medicine

## 2020-04-28 ENCOUNTER — Other Ambulatory Visit: Payer: Self-pay

## 2020-04-28 VITALS — BP 138/82 | Temp 97.2°F | Wt 138.0 lb

## 2020-04-28 DIAGNOSIS — R1030 Lower abdominal pain, unspecified: Secondary | ICD-10-CM

## 2020-04-28 DIAGNOSIS — E7849 Other hyperlipidemia: Secondary | ICD-10-CM

## 2020-04-28 DIAGNOSIS — I6529 Occlusion and stenosis of unspecified carotid artery: Secondary | ICD-10-CM

## 2020-04-28 DIAGNOSIS — R2681 Unsteadiness on feet: Secondary | ICD-10-CM

## 2020-04-28 DIAGNOSIS — I700 Atherosclerosis of aorta: Secondary | ICD-10-CM

## 2020-04-28 DIAGNOSIS — Z23 Encounter for immunization: Secondary | ICD-10-CM | POA: Diagnosis not present

## 2020-04-28 DIAGNOSIS — I10 Essential (primary) hypertension: Secondary | ICD-10-CM | POA: Diagnosis not present

## 2020-04-28 DIAGNOSIS — N289 Disorder of kidney and ureter, unspecified: Secondary | ICD-10-CM

## 2020-04-28 DIAGNOSIS — M545 Low back pain, unspecified: Secondary | ICD-10-CM | POA: Diagnosis not present

## 2020-04-28 DIAGNOSIS — R7989 Other specified abnormal findings of blood chemistry: Secondary | ICD-10-CM | POA: Diagnosis not present

## 2020-04-28 NOTE — Progress Notes (Signed)
Subjective:    Patient ID: Becky Baird, female    DOB: 12/02/1933, 86 y.o.   MRN: [id number]  Hypertension This is a chronic problem. The current episode started more than 1 year ago. Risk factors for coronary artery disease include post-menopausal state. Treatments tried: hctz. There are no compliance problems.    Wants to discuss cortisone shot for back pain -does dr think it would help Results for orders placed or performed in visit on 01/29/20  Lipid panel  Result Value Ref Range   Cholesterol, Total 202 (H) 100 - 199 mg/dL   Triglycerides 56 0 - 149 mg/dL   HDL 71 >39 mg/dL   VLDL Cholesterol Cal 10 5 - 40 mg/dL   LDL Chol Calc (NIH) 121 (H) 0 - 99 mg/dL   Chol/HDL Ratio 2.8 0.0 - 4.4 ratio  Basic metabolic panel  Result Value Ref Range   Glucose 86 65 - 99 mg/dL   BUN 23 8 - 27 mg/dL   Creatinine, Ser 1.46 (H) 0.57 - 1.00 mg/dL   GFR calc non Af Amer 33 (L) >59 mL/min/1.73   GFR calc Af Amer 38 (L) >59 mL/min/1.73   BUN/Creatinine Ratio 16 12 - 28   Sodium 143 134 - 144 mmol/L   Potassium 4.3 3.5 - 5.2 mmol/L   Chloride 101 96 - 106 mmol/L   CO2 24 20 - 29 mmol/L   Calcium 9.9 8.7 - 10.3 mg/dL  CBC with Differential/Platelet  Result Value Ref Range   WBC 6.0 3.4 - 10.8 x10E3/uL   RBC 4.09 3.77 - 5.28 x10E6/uL   Hemoglobin 12.8 11.1 - 15.9 g/dL   Hematocrit 38.2 34.0 - 46.6 %   MCV 93 79 - 97 fL   MCH 31.3 26.6 - 33.0 pg   MCHC 33.5 31 - 35 g/dL   RDW 12.4 11.7 - 15.4 %   Platelets 253 150 - 450 x10E3/uL   Neutrophils 58 Not Estab. %   Lymphs 27 Not Estab. %   Monocytes 11 Not Estab. %   Eos 3 Not Estab. %   Basos 1 Not Estab. %   Neutrophils Absolute 3.6 1 - 7 x10E3/uL   Lymphocytes Absolute 1.6 0 - 3 x10E3/uL   Monocytes Absolute 0.6 0 - 0 x10E3/uL   EOS (ABSOLUTE) 0.2 0.0 - 0.4 x10E3/uL   Basophils Absolute 0.0 0 - 0 x10E3/uL   Immature Granulocytes 0 Not Estab. %   Immature Grans (Abs) 0.0 0.0 - 0.1 x10E3/uL  Ferritin  Result Value Ref Range    Ferritin 490 (H) 15.0 - 150.0 ng/mL  Iron Binding Cap (TIBC)([facility])  Result Value Ref Range   Total Iron Binding Capacity 250 250 - 450 ug/dL   UIBC 161 118 - 369 ug/dL   Iron 89 27 - 139 ug/dL   Iron Saturation 36 15 - 55 %   Right lumbar pain Worse with standing  Balance fair Tries to sit ergonomically Hurts to stand Falls prevention discussed Does stretching for pain  Patient also had within the past 2 weeks episode that woke her up at night with right lower quadrant pain it was hard for her to determine if it was her abdomen or more in the anterior hip region when she laid down she pointing more toward the right lower pelvic region when she stood up it was more toward the pelvic rim hip region there is no tenderness on abdominal exam no vomiting or fevers associated with this no rectal bleeding. Review  of Systems    Objective:   Physical Exam Lungs clear respiratory rate normal heart regular low back subjective tenderness and discomfort with stiffness abdomen exam soft decreased range of motion of both hips       Assessment & Plan:  1. Need for vaccination Flu shot today - Flu Vaccine QUAD High Dose(Fluad)  2. Renal insufficiency Significant renal insufficiency stage IIIb/IV recommend urinalysis along with urine micro protein - Urinalysis, Routine w reflex microscopic - Microalbumin / creatinine urine ratio  3. Primary hypertension Blood pressure decent control watch diet eat plenty of vegetables and fruits  4. Other hyperlipidemia Does not tolerate statins taking red yeast rice extract  5. Unsteady gait Uses a cane at risk of falling fall prevention discussed  6. Aortic atherosclerosis (HCC) Aortic atherosclerosis does not tolerate statins healthy diet red yeast rice extract she does tolerate  7. Lumbar pain Significant lumbar pain we did talk about how she could see a back specialist for injections but currently right now she defers  8. Abdominal  pain, lower Patient did have right lower quadrant abdominal discomfort versus pelvic rim discomfort patient will keep track of this and follow-up in 1 month to see how she is doing if she has progressive troubles with this we will move forward with x-rays or possible scan  9. Elevated ferritin Elevated ferritin stop iron tablets

## 2020-04-29 LAB — URINALYSIS, ROUTINE W REFLEX MICROSCOPIC

## 2020-04-29 LAB — MICROALBUMIN / CREATININE URINE RATIO
Creatinine, Urine: 96.6 mg/dL
Microalb/Creat Ratio: 15 mg/g creat (ref 0–29)
Microalbumin, Urine: 14.5 ug/mL

## 2020-04-29 LAB — SPECIMEN STATUS REPORT

## 2020-04-30 ENCOUNTER — Encounter: Payer: Self-pay | Admitting: Family Medicine

## 2020-05-12 ENCOUNTER — Telehealth: Payer: Self-pay | Admitting: Cardiology

## 2020-05-12 MED ORDER — RIVAROXABAN 15 MG PO TABS: ORAL_TABLET | ORAL | 0 refills | Status: DC

## 2020-05-12 NOTE — Telephone Encounter (Signed)
Would like to know if she can have samples of Rivaroxaban (XARELTO) 15 MG TABS tablet    [phone]

## 2020-05-12 NOTE — Telephone Encounter (Signed)
Pt aware that samples are available for pickup and will come by [facility] office this morning

## 2020-05-12 NOTE — Addendum Note (Signed)
Addended by: Julian Hy T on: 05/12/2020 10:05 AM   Modules accepted: Orders

## 2020-05-21 DIAGNOSIS — M79674 Pain in right toe(s): Secondary | ICD-10-CM | POA: Diagnosis not present

## 2020-05-21 DIAGNOSIS — I739 Peripheral vascular disease, unspecified: Secondary | ICD-10-CM | POA: Diagnosis not present

## 2020-05-21 DIAGNOSIS — L110 Acquired keratosis follicularis: Secondary | ICD-10-CM | POA: Diagnosis not present

## 2020-05-21 DIAGNOSIS — M79672 Pain in left foot: Secondary | ICD-10-CM | POA: Diagnosis not present

## 2020-05-21 DIAGNOSIS — M79671 Pain in right foot: Secondary | ICD-10-CM | POA: Diagnosis not present

## 2020-05-21 DIAGNOSIS — M79675 Pain in left toe(s): Secondary | ICD-10-CM | POA: Diagnosis not present

## 2020-05-27 DIAGNOSIS — Z23 Encounter for immunization: Secondary | ICD-10-CM | POA: Diagnosis not present

## 2020-05-28 ENCOUNTER — Telehealth: Payer: Self-pay | Admitting: Cardiology

## 2020-05-28 MED ORDER — RIVAROXABAN 15 MG PO TABS: ORAL_TABLET | ORAL | 0 refills | Status: DC

## 2020-05-28 NOTE — Telephone Encounter (Signed)
Patient calling the office for samples of medication:   1.  What medication and dosage are you requesting samples for?  Would like to know if she can have samples ofRivaroxaban (XARELTO) 15 MG TABS tablet   2.  Are you currently out of this medication?

## 2020-05-28 NOTE — Telephone Encounter (Signed)
Pt will come by today to pick up samples

## 2020-06-01 ENCOUNTER — Ambulatory Visit (HOSPITAL_COMMUNITY): Payer: Medicare Other

## 2020-06-02 ENCOUNTER — Ambulatory Visit (HOSPITAL_COMMUNITY)
Admission: RE | Admit: 2020-06-02 | Discharge: 2020-06-02 | Disposition: A | Discharge status: Home / Self Care | Payer: Medicare Other | Source: Ambulatory Visit | Attending: Family Medicine | Admitting: Family Medicine

## 2020-06-02 ENCOUNTER — Ambulatory Visit (INDEPENDENT_AMBULATORY_CARE_PROVIDER_SITE_OTHER): Payer: Medicare Other | Admitting: Family Medicine

## 2020-06-02 ENCOUNTER — Ambulatory Visit: Payer: Medicare Other | Admitting: Family Medicine

## 2020-06-02 ENCOUNTER — Encounter: Payer: Self-pay | Admitting: Family Medicine

## 2020-06-02 ENCOUNTER — Other Ambulatory Visit: Payer: Self-pay

## 2020-06-02 VITALS — BP 128/72 | HR 77 | Temp 94.9°F | Wt 128.8 lb

## 2020-06-02 DIAGNOSIS — I6529 Occlusion and stenosis of unspecified carotid artery: Secondary | ICD-10-CM | POA: Diagnosis not present

## 2020-06-02 DIAGNOSIS — N289 Disorder of kidney and ureter, unspecified: Secondary | ICD-10-CM

## 2020-06-02 DIAGNOSIS — J439 Emphysema, unspecified: Secondary | ICD-10-CM | POA: Diagnosis not present

## 2020-06-02 DIAGNOSIS — I10 Essential (primary) hypertension: Secondary | ICD-10-CM | POA: Diagnosis not present

## 2020-06-02 DIAGNOSIS — R0600 Dyspnea, unspecified: Secondary | ICD-10-CM | POA: Insufficient documentation

## 2020-06-02 DIAGNOSIS — R0609 Other forms of dyspnea: Secondary | ICD-10-CM

## 2020-06-02 NOTE — Progress Notes (Signed)
   Subjective:    Patient ID: Becky Baird, female    DOB: 12-05-1933, 86 y.o.   MRN: [id number]  HPI Pt here for follow up for protein in urine. Pt was seen in October for renal insufficiency.  This patient has renal insufficiency.  Very nice lady Experiencing symptoms breath with activity.  Fatigued tired she does anything strenuous.  She has had some balance issues recently and unfortunately has not couple falls none with serious injury.  At times she tries to do things that she is just not physically capable of doing.  Patient is followed by cardiology and had echo earlier this year.  Pt still getting really winded and tired.    Review of Systems Does relate shortness of breath and fatigue with activity.  Denies any substernal chest tightness pressure pain    Objective:   Physical Exam  Extremities no edema skin warm dry neurologic grossly normal with normal no crackles in the lungs      Assessment & Plan:  1. DOE (dyspnea on exertion) Significant shortness of breath that occurs with activity probably related to underlying valve issues or possibly diastolic dysfunction.  EKG looks good compared to previous EKG does not have significant changes unlikely to be coronary artery disease unlikely to be CHF no orthopnea no PND no swelling in the legs.  We will get chest x-ray after that will connect with cardiology to see if any further input regarding this It is quite possible this is a developing chronic issue that we will just need to be tolerated and lived with - DG Chest 2 View - EKG 12-Lead  2. Renal insufficiency Creatinine has been stable avoid excessive protein in the diet check lab work again come springtime urine micro protein recently negative  3. Primary hypertension Blood pressure good control continue current measures

## 2020-06-03 ENCOUNTER — Ambulatory Visit: Payer: Medicare Other | Admitting: Family Medicine

## 2020-06-04 ENCOUNTER — Telehealth: Payer: Self-pay

## 2020-06-04 ENCOUNTER — Encounter: Payer: Self-pay | Admitting: Family Medicine

## 2020-06-04 NOTE — Telephone Encounter (Signed)
See My Chart Message 06/04/20

## 2020-06-04 NOTE — Telephone Encounter (Signed)
Nurses see message  Please call the daughter.  I would suggest that they approach this as part of aging that can go on.  This is not a sign of severe disease.  And utilizing Symbicort could help with some of the symptoms if they are interested in trying to do so.  I do not feel that this is a sign of any catastrophic issue at the moment but certainly am sensitive to their situation

## 2020-06-04 NOTE — Telephone Encounter (Signed)
Discussed with daughter [name](DPR)

## 2020-06-04 NOTE — Telephone Encounter (Signed)
Please call daughter([name]) with result of chest x-ray and any medication that the doctor wants to start her on.

## 2020-06-08 ENCOUNTER — Telehealth: Payer: Self-pay | Admitting: *Deleted

## 2020-06-08 ENCOUNTER — Other Ambulatory Visit: Payer: Self-pay | Admitting: *Deleted

## 2020-06-08 ENCOUNTER — Other Ambulatory Visit: Payer: Self-pay | Admitting: Family Medicine

## 2020-06-08 MED ORDER — BREO ELLIPTA 100-25 MCG/INH IN AEPB: INHALATION_SPRAY | RESPIRATORY_TRACT | 5 refills | Status: DC

## 2020-06-08 NOTE — Telephone Encounter (Signed)
Patient called back regarding chest x ray and copd changes. Patient states she is willing to start Symbicort as Dr. Nicki Reaper mentioned and would like this called into [facility].

## 2020-06-08 NOTE — Telephone Encounter (Signed)
I looked into this medication. It is not covered by her insurance-Symbicort But Memory Dance is covered very similar 1 inhalation once a day rinse mouth after use Patient has a follow-up within 3 months but may follow-up sooner if she would like to We could schedule follow-up in 4 to 6 weeks if she would like to do so her her decision

## 2020-06-08 NOTE — Telephone Encounter (Signed)
Patient notified and will contact office to schedule appointment.

## 2020-06-10 ENCOUNTER — Encounter: Payer: Self-pay | Admitting: Emergency Medicine

## 2020-06-10 ENCOUNTER — Other Ambulatory Visit: Payer: Self-pay

## 2020-06-10 ENCOUNTER — Ambulatory Visit
Admission: EM | Admit: 2020-06-10 | Discharge: 2020-06-10 | Disposition: A | Discharge status: Home / Self Care | Payer: Medicare Other | Attending: Emergency Medicine | Admitting: Emergency Medicine

## 2020-06-10 ENCOUNTER — Encounter: Payer: Self-pay | Admitting: Family Medicine

## 2020-06-10 DIAGNOSIS — R6889 Other general symptoms and signs: Secondary | ICD-10-CM

## 2020-06-10 DIAGNOSIS — R410 Disorientation, unspecified: Secondary | ICD-10-CM

## 2020-06-10 LAB — POCT URINALYSIS DIP (MANUAL ENTRY)
Bilirubin, UA: NEGATIVE
Glucose, UA: NEGATIVE mg/dL
Ketones, POC UA: NEGATIVE mg/dL
Nitrite, UA: NEGATIVE
Protein Ur, POC: 100 mg/dL — AB
Spec Grav, UA: 1.02 (ref 1.010–1.025)
Urobilinogen, UA: 0.2 E.U./dL
pH, UA: 6 (ref 5.0–8.0)

## 2020-06-10 MED ORDER — ACETAMINOPHEN 325 MG PO TABS
650.0000 mg | ORAL_TABLET | Freq: Once | ORAL | Status: AC
  Administered 2020-06-10: 650 mg via ORAL

## 2020-06-10 MED ORDER — CEFTRIAXONE SODIUM 1 G IJ SOLR
1.0000 g | Freq: Once | INTRAMUSCULAR | Status: AC
  Administered 2020-06-10: 1 g via INTRAMUSCULAR

## 2020-06-10 MED ORDER — CEPHALEXIN 500 MG PO CAPS: 500.0000 mg | ORAL_CAPSULE | Freq: Two times a day (BID) | ORAL | 0 refills | Status: DC

## 2020-06-10 NOTE — ED Provider Notes (Signed)
[facility]   CC: "UTI"  SUBJECTIVE:  Becky Baird is a 86 y.o. female who complains of low back pain, headache, body aches, nausea, vomiting and some mild confusion x 2 days.  Reports similar symptoms in the past with UTIs.  Patient has hx of frequent UTIs.  Denies COVID exposure or concern for COVID infection.  Admits to decreased water intake.  Denies abdominal or flank pain.  Has NOT tried OTC medications.  Denies aggravating factors. Admits to similar symptoms in the past with UTI.  Denies abdominal pain, flank pain, abnormal vaginal discharge or bleeding, hematuria.    LMP: No LMP recorded. Patient is postmenopausal.  ROS: As in HPI.  All other pertinent ROS negative.     Past Medical History:  Diagnosis Date  . Anxiety   . Chronic back pain   . Hyperlipidemia   . Hypertension   . Memory loss   . Mild cognitive impairment, so stated   . Occlusion and stenosis of carotid artery with cerebral infarction    Bilateral less than 50% carotid stenosis.  . Persistent atrial fibrillation ([place]) 06/23/2016  . Renal cyst   . Sciatica   . Stroke ([place])    2010, RMCA with stent  . Toe fracture 11/2015   R toe  . Ulcer 1970s   Past Surgical History:  Procedure Laterality Date  . BIOPSY  04/25/2018   Procedure: BIOPSY;  Surgeon: Rogene Houston, MD;  [facility];  Service: Endoscopy;;  gastric polyps  . COLONOSCOPY  03/10/2011   Procedure: COLONOSCOPY;  Surgeon: Rogene Houston, MD;  [facility];  Service: Endoscopy;  Laterality: N/A;  . COLONOSCOPY N/A 08/24/2016   Procedure: COLONOSCOPY;  Surgeon: Rogene Houston, MD;  [facility];  Service: Endoscopy;  Laterality: N/A;  930  . DILATION AND CURETTAGE OF UTERUS     years ago  . ESOPHAGOGASTRODUODENOSCOPY N/A 04/25/2018   Procedure: ESOPHAGOGASTRODUODENOSCOPY (EGD);  Surgeon: Rogene Houston, MD;  [facility];  Service: Endoscopy;  Laterality: N/A;  1:15  . OVARIAN CYST  REMOVAL     at least 10 years ago  . POLYPECTOMY  08/24/2016   Procedure: POLYPECTOMY;  Surgeon: Rogene Houston, MD;  [facility];  Service: Endoscopy;;  ascending colon; hepatic flexure  . TONSILLECTOMY     childhood   Allergies  Allergen Reactions  . Amlodipine     Pedal edema  . Niaspan [Niacin Er] Rash  . Oxybutynin     Nausea sweats   No current facility-administered medications on file prior to encounter.   Current Outpatient Medications on File Prior to Encounter  Medication Sig Dispense Refill  . ALPRAZolam (XANAX) 0.25 MG tablet TAKE ONE TABLET BY MOUTH AT LUNCHTIME AS NEEDED, ONE TABLET IN THE AFTERNOON AS NEEDED, AND ONE TABLET AT BEDTIME AS NEEDED *Stop alprazolam 0.5 mg* 90 tablet 3  . clobetasol ointment (TEMOVATE) 7.84 % Apply 1 application topically 2 (two) times daily as needed.    Marland Kitchen estradiol (ESTRACE) 0.1 MG/GM vaginal cream Place 1 Applicatorful vaginally once a week.    . feeding supplement (BOOST HIGH PROTEIN) LIQD Take 1 Container by mouth 3 (three) times daily between meals.    . feeding supplement, GLUCERNA SHAKE, (GLUCERNA SHAKE) LIQD Take 237 mLs by mouth 3 (three) times daily between meals.     . fish oil-omega-3 fatty acids 1000 MG capsule Take 2 g by mouth 2 (two) times a day.     Marland Kitchen  fluticasone furoate-vilanterol (BREO ELLIPTA) 100-25 MCG/INH AEPB 1 inhalation daily 28 each 5  . hydrochlorothiazide (HYDRODIURIL) 12.5 MG tablet TAKE ONE TABLET BY MOUTH DAILY 30 tablet 5  . Iron-FA-B Cmp-C-Biot-Probiotic (FUSION PLUS) CAPS Take 1 capsule by mouth every morning.   3  . Ketotifen Fumarate (ALAWAY OP) Apply 1 drop to eye daily as needed.    . loratadine (CLARITIN) 10 MG tablet Take 1 tablet (10 mg total) by mouth daily. (Patient taking differently: Take 10 mg by mouth at bedtime. ) 30 tablet 5  . MYRBETRIQ 50 MG TB24 tablet Take 50 mg by mouth daily.    Marland Kitchen OVER THE COUNTER MEDICATION Take 1 capsule by mouth every morning. [place]    . OVER  THE COUNTER MEDICATION Take by mouth daily. Vitamin d3     . pantoprazole (PROTONIX) 40 MG tablet TAKE ONE TABLET BY MOUTH DAILY 30 tablet 5  . potassium chloride (KLOR-CON) 10 MEQ tablet TAKE ONE TABLET BY MOUTH TWICE DAILY 60 tablet 5  . Red Yeast Rice 600 MG CAPS Take 1,200 mg by mouth 2 (two) times a day.     . Rivaroxaban (XARELTO) 15 MG TABS tablet TAKE ONE TABLET BY MOUTH DAILY WITH SUPPER. 21 tablet 0  . sodium chloride (OCEAN) 0.65 % nasal spray Place 1 spray into the nose as needed for congestion.      Social History   Socioeconomic History  . Marital status: Widowed    Spouse name: Not on file  . Number of children: 2  . Years of education: College  . Highest education level: Not on file  Occupational History    Employer: RETIRED  Tobacco Use  . Smoking status: Never Smoker  . Smokeless tobacco: Never Used  Vaping Use  . Vaping Use: Never used  Substance and Sexual Activity  . Alcohol use: No    Alcohol/week: 0.0 standard drinks  . Drug use: No  . Sexual activity: Not on file  Other Topics Concern  . Not on file  Social History Narrative   Patient lives at home alone.   Caffeine Use: very little, rarely   Social Determinants of Health   Financial Resource Strain:   . Difficulty of Paying Living Expenses: Not on file  Food Insecurity:   . Worried About Charity fundraiser in the Last Year: Not on file  . Ran Out of Food in the Last Year: Not on file  Transportation Needs:   . Lack of Transportation (Medical): Not on file  . Lack of Transportation (Non-Medical): Not on file  Physical Activity:   . Days of Exercise per Week: Not on file  . Minutes of Exercise per Session: Not on file  Stress:   . Feeling of Stress : Not on file  Social Connections:   . Frequency of Communication with Friends and Family: Not on file  . Frequency of Social Gatherings with Friends and Family: Not on file  . Attends Religious Services: Not on file  . Active Member of Clubs or  Organizations: Not on file  . Attends Archivist Meetings: Not on file  . Marital Status: Not on file  Intimate Partner Violence:   . Fear of Current or Ex-Partner: Not on file  . Emotionally Abused: Not on file  . Physically Abused: Not on file  . Sexually Abused: Not on file   Family History  Problem Relation Age of Onset  . Stroke Mother   . Heart failure Father   .  Diabetes Brother     OBJECTIVE:  Vitals:   06/10/20 1632 06/10/20 1634  BP:  (!) 147/61  Pulse:  87  Resp:  19  Temp:  (!) 100.4 F (38 C)  TempSrc:  Oral  SpO2:  95%  Weight: 127 lb 13.9 oz (58 kg)   Height: 5\' 7"  (1.702 m)    General appearance: Alert; fatigued appearing HEENT: NCAT.  Oropharynx clear.  Lungs: clear to auscultation bilaterally without adventitious breath sounds Heart: regular rate and rhythm.   Abdomen: soft; non-distended; no tenderness; bowel sounds present; no guarding  Back: no CVA tenderness Extremities: no edema; symmetrical with no gross deformities Skin: warm and dry Neurologic: Ambulates with a cane Psychological: alert and cooperative; normal mood and affect  Labs Reviewed  POCT URINALYSIS DIP (MANUAL ENTRY) - Abnormal; Notable for the following components:      Result Value   Clarity, UA cloudy (*)    Blood, UA moderate (*)    Protein Ur, POC =100 (*)    Leukocytes, UA Large (3+) (*)    All other components within normal limits  URINE CULTURE    ASSESSMENT & PLAN:  1. Flu-like symptoms   2. Confusion     Meds ordered this encounter  Medications  . acetaminophen (TYLENOL) tablet 650 mg  . cephALEXin (KEFLEX) 500 MG capsule    Sig: Take 1 capsule (500 mg total) by mouth 2 (two) times daily for 10 days.    Dispense:  20 capsule    Refill:  0    Order Specific Question:   Supervising Provider    Answer:   Raylene Everts [[id number]]  . cefTRIAXone (ROCEPHIN) injection 1 g   Urine concerning for UTI Urine culture sent.  We will call you with the  results.   Push fluids and get plenty of rest.   Take antibiotic as directed and to completion Follow up with PCP for recheck this week or next to ensure symptoms are improving Return here or go to ER if you have any new or worsening symptoms such as fever, worsening abdominal pain, nausea/vomiting, flank pain, etc...  1 gram of rocephin given in office Advised family to take patient to ED if symptoms did not improve over the next 24 hours with IM injection and antibiotic.    Outlined signs and symptoms indicating need for more acute intervention. Patient verbalized understanding. After Visit Summary given.     Lestine Box, PA-C 06/10/20 1700

## 2020-06-10 NOTE — ED Triage Notes (Signed)
Pt has hx of frequent uti's.  Pt is having low back pain, headache, body aches and some mild confusion.  These are the symptoms she has had in the past with uti's

## 2020-06-10 NOTE — Discharge Instructions (Signed)
Urine concerning for UTI Urine culture sent.  We will call you with the results.   Push fluids and get plenty of rest.   Take antibiotic as directed and to completion Follow up with PCP for recheck this week or next to ensure symptoms are improving Return here or go to ER if you have any new or worsening symptoms such as fever, worsening abdominal pain, nausea/vomiting, flank pain, etc..Marland Kitchen

## 2020-06-11 ENCOUNTER — Telehealth: Payer: Self-pay

## 2020-06-11 NOTE — Telephone Encounter (Signed)
Patient daughter notified

## 2020-06-11 NOTE — Telephone Encounter (Signed)
I am out this afternoon  I would recommend a follow-up visit tomorrow at 3 PM (Obviously if patient has severe fevers today vomiting feeling worse she may need to go to the emergency department)  Continue current antibiotics

## 2020-06-11 NOTE — Telephone Encounter (Signed)
Patient daughter states she was given abx injection and keflex to go home on. Patient feels very fatigued and weak today but states she is feeling better. Still having a hard time controlling her urine and feels her fever is going up despite tylenol. Urgent care did a dip but did not have enough urine for culture. Daughter offering to bring a urine sample to the office but she did start antibiotics last night. Please advise next steps.

## 2020-06-11 NOTE — Telephone Encounter (Signed)
Patient went to urgent care yesterday and had UTI. She was given a shot and was told to follow up with primary .She had a fever of 101.2 last night but no fever this morning. She has been taking extra strength tylenol every 6 hours. Please advise on where to place on schedule for follow up

## 2020-06-12 ENCOUNTER — Ambulatory Visit (INDEPENDENT_AMBULATORY_CARE_PROVIDER_SITE_OTHER): Payer: Medicare Other | Admitting: Family Medicine

## 2020-06-12 ENCOUNTER — Encounter: Payer: Self-pay | Admitting: Family Medicine

## 2020-06-12 ENCOUNTER — Other Ambulatory Visit: Payer: Self-pay

## 2020-06-12 VITALS — BP 118/70 | HR 66 | Temp 97.5°F | Ht 67.0 in | Wt 129.8 lb

## 2020-06-12 DIAGNOSIS — I6529 Occlusion and stenosis of unspecified carotid artery: Secondary | ICD-10-CM

## 2020-06-12 DIAGNOSIS — N3000 Acute cystitis without hematuria: Secondary | ICD-10-CM | POA: Diagnosis not present

## 2020-06-12 MED ORDER — CEFPROZIL 500 MG PO TABS: 500.0000 mg | ORAL_TABLET | Freq: Two times a day (BID) | ORAL | 0 refills | Status: DC

## 2020-06-12 NOTE — Progress Notes (Signed)
   Subjective:    Patient ID: Becky Baird, female    DOB: [birth year], 86 y.o.   MRN: [id number]  HPIpt is with daughter Dolan Amen. Patient was seen recently with UTI treated with Rocephin and Keflex twice daily.  Starting to feel somewhat better.  Was having some confusion but this is getting better.  Energy level still fairly weak.  No high fever chills no flank pains currently.  PMH benign Pt went to urgent care 2 days ago and given keflex for uti. Having frequent urination.       Review of Systems  Constitutional: Negative for activity change, appetite change and fatigue.  HENT: Negative for congestion.   Respiratory: Negative for cough.   Cardiovascular: Negative for chest pain.  Gastrointestinal: Negative for abdominal pain.  Genitourinary: Positive for frequency. Negative for difficulty urinating and dysuria.  Skin: Negative for color change.  Neurological: Negative for headaches.  Psychiatric/Behavioral: Negative for behavioral problems.       Objective:   Physical Exam  Lungs clear heart regular flanks nontender extremities no edema      Assessment & Plan:  UTI-switch over to Cefzil twice daily.  Warning signs discussed.  Keep all regular follow-up visits.  No need to do any type of blood work x-rays or scans currently.

## 2020-06-22 ENCOUNTER — Telehealth: Payer: Self-pay | Admitting: Cardiology

## 2020-06-22 MED ORDER — RIVAROXABAN 15 MG PO TABS
ORAL_TABLET | ORAL | 0 refills | Status: DC
Start: 2020-06-22 — End: 2020-12-30

## 2020-06-22 NOTE — Telephone Encounter (Signed)
Patient called again wants to know if Linna Hoff has any samples

## 2020-06-22 NOTE — Telephone Encounter (Signed)
Patient calling the office for samples of medication:   1.  What medication and dosage are you requesting samples for? XARELTO 15 MG    2.  Are you currently out of this medication? YES  PATIENT IS REQUESTING THAT PRESCRIPTION NOT TO BE FILLED AT THIS TIME DUE TO $

## 2020-06-22 NOTE — Telephone Encounter (Signed)
NEW MESSAGE    Patient calling the office for samples of medication:   1.  What medication and dosage are you requesting samples for? XARELTO 15MG   2.  Are you currently out of this medication?  YES

## 2020-06-22 NOTE — Telephone Encounter (Signed)
Samples available for pickup

## 2020-06-24 ENCOUNTER — Ambulatory Visit
Admission: EM | Admit: 2020-06-24 | Discharge: 2020-06-24 | Disposition: A | Discharge status: Home / Self Care | Payer: Medicare Other | Attending: Emergency Medicine | Admitting: Emergency Medicine

## 2020-06-24 ENCOUNTER — Other Ambulatory Visit: Payer: Self-pay

## 2020-06-24 ENCOUNTER — Encounter: Payer: Self-pay | Admitting: Emergency Medicine

## 2020-06-24 ENCOUNTER — Telehealth: Payer: Self-pay

## 2020-06-24 DIAGNOSIS — R300 Dysuria: Secondary | ICD-10-CM | POA: Insufficient documentation

## 2020-06-24 DIAGNOSIS — R350 Frequency of micturition: Secondary | ICD-10-CM | POA: Diagnosis not present

## 2020-06-24 LAB — POCT URINALYSIS DIP (MANUAL ENTRY)
Bilirubin, UA: NEGATIVE
Glucose, UA: NEGATIVE mg/dL
Ketones, POC UA: NEGATIVE mg/dL
Nitrite, UA: NEGATIVE
Protein Ur, POC: NEGATIVE mg/dL
Spec Grav, UA: 1.015 (ref 1.010–1.025)
Urobilinogen, UA: 0.2 E.U./dL
pH, UA: 6 (ref 5.0–8.0)

## 2020-06-24 MED ORDER — CEPHALEXIN 500 MG PO CAPS: 500.0000 mg | ORAL_CAPSULE | Freq: Two times a day (BID) | ORAL | 0 refills | Status: DC

## 2020-06-24 MED ORDER — CEFTRIAXONE SODIUM 1 G IJ SOLR
1.0000 g | Freq: Once | INTRAMUSCULAR | Status: AC
  Administered 2020-06-24: 1 g via INTRAMUSCULAR

## 2020-06-24 NOTE — Telephone Encounter (Signed)
Pt was just seen 11/19 for UTI was put on antibiotic and now she has another UTI can something be called in her daughter is unable to get her here to be tested.  Pt call back [place]

## 2020-06-24 NOTE — Telephone Encounter (Signed)
Patient with history of frequent UTIs and was seen 06/12/20 and given Cefzil for 7 days for UTI. The symptoms have returned and they are requesting an antibiotic to be seen in - they can not get her to the office for appointment.

## 2020-06-24 NOTE — ED Provider Notes (Signed)
[facility]   CC: Burning with urination  SUBJECTIVE:  Becky Baird is a 86 y.o. female who complains of urinary frequency, and dysuria for the past 1 day.  Patient denies a precipitating event, decreased water intake, excessive caffeine intake.  Denies abdominal or flank pain.  Has NOT tried OTC medications.  Symptoms are made worse with urination.  Admits to similar symptoms in the past with UTI.  Denies fever, chills, nausea, vomiting, abdominal pain, flank pain, abnormal vaginal discharge or bleeding, hematuria.    LMP: No LMP recorded. Patient is postmenopausal.  ROS: As in HPI.  All other pertinent ROS negative.     Past Medical History:  Diagnosis Date  . Anxiety   . Chronic back pain   . Hyperlipidemia   . Hypertension   . Memory loss   . Mild cognitive impairment, so stated   . Occlusion and stenosis of carotid artery with cerebral infarction    Bilateral less than 50% carotid stenosis.  . Persistent atrial fibrillation ([place]) 06/23/2016  . Renal cyst   . Sciatica   . Stroke ([place])    2010, RMCA with stent  . Toe fracture 11/2015   R toe  . Ulcer 1970s   Past Surgical History:  Procedure Laterality Date  . BIOPSY  04/25/2018   Procedure: BIOPSY;  Surgeon: Rogene Houston, MD;  [facility];  Service: Endoscopy;;  gastric polyps  . COLONOSCOPY  03/10/2011   Procedure: COLONOSCOPY;  Surgeon: Rogene Houston, MD;  [facility];  Service: Endoscopy;  Laterality: N/A;  . COLONOSCOPY N/A 08/24/2016   Procedure: COLONOSCOPY;  Surgeon: Rogene Houston, MD;  [facility];  Service: Endoscopy;  Laterality: N/A;  930  . DILATION AND CURETTAGE OF UTERUS     years ago  . ESOPHAGOGASTRODUODENOSCOPY N/A 04/25/2018   Procedure: ESOPHAGOGASTRODUODENOSCOPY (EGD);  Surgeon: Rogene Houston, MD;  [facility];  Service: Endoscopy;  Laterality: N/A;  1:15  . OVARIAN CYST REMOVAL     at least 10 years ago  . POLYPECTOMY   08/24/2016   Procedure: POLYPECTOMY;  Surgeon: Rogene Houston, MD;  [facility];  Service: Endoscopy;;  ascending colon; hepatic flexure  . TONSILLECTOMY     childhood   Allergies  Allergen Reactions  . Amlodipine     Pedal edema  . Niaspan [Niacin Er] Rash  . Oxybutynin     Nausea sweats   No current facility-administered medications on file prior to encounter.   Current Outpatient Medications on File Prior to Encounter  Medication Sig Dispense Refill  . ALPRAZolam (XANAX) 0.25 MG tablet TAKE ONE TABLET BY MOUTH AT LUNCHTIME AS NEEDED, ONE TABLET IN THE AFTERNOON AS NEEDED, AND ONE TABLET AT BEDTIME AS NEEDED *Stop alprazolam 0.5 mg* 90 tablet 3  . cefPROZIL (CEFZIL) 500 MG tablet Take 1 tablet (500 mg total) by mouth 2 (two) times daily. 14 tablet 0  . clobetasol ointment (TEMOVATE) 0.34 % Apply 1 application topically 2 (two) times daily as needed.    Marland Kitchen estradiol (ESTRACE) 0.1 MG/GM vaginal cream Place 1 Applicatorful vaginally once a week.    . feeding supplement (BOOST HIGH PROTEIN) LIQD Take 1 Container by mouth 3 (three) times daily between meals.    . feeding supplement, GLUCERNA SHAKE, (GLUCERNA SHAKE) LIQD Take 237 mLs by mouth 3 (three) times daily between meals.     . fish oil-omega-3 fatty acids 1000 MG capsule Take 2 g  by mouth 2 (two) times a day.     . hydrochlorothiazide (HYDRODIURIL) 12.5 MG tablet TAKE ONE TABLET BY MOUTH DAILY 30 tablet 5  . Ketotifen Fumarate (ALAWAY OP) Apply 1 drop to eye daily as needed.    . loratadine (CLARITIN) 10 MG tablet Take 1 tablet (10 mg total) by mouth daily. (Patient taking differently: Take 10 mg by mouth at bedtime. ) 30 tablet 5  . MYRBETRIQ 50 MG TB24 tablet Take 50 mg by mouth daily.    Marland Kitchen OVER THE COUNTER MEDICATION Take 1 capsule by mouth every morning. [place]    . OVER THE COUNTER MEDICATION Take by mouth daily. Vitamin d3     . pantoprazole (PROTONIX) 40 MG tablet TAKE ONE TABLET BY MOUTH DAILY 30 tablet  5  . potassium chloride (KLOR-CON) 10 MEQ tablet TAKE ONE TABLET BY MOUTH TWICE DAILY 60 tablet 5  . Red Yeast Rice 600 MG CAPS Take 1,200 mg by mouth 2 (two) times a day.     . Rivaroxaban (XARELTO) 15 MG TABS tablet TAKE ONE TABLET BY MOUTH DAILY WITH SUPPER. 21 tablet 0  . sodium chloride (OCEAN) 0.65 % nasal spray Place 1 spray into the nose as needed for congestion.      Social History   Socioeconomic History  . Marital status: Widowed    Spouse name: Not on file  . Number of children: 2  . Years of education: College  . Highest education level: Not on file  Occupational History    Employer: RETIRED  Tobacco Use  . Smoking status: Never Smoker  . Smokeless tobacco: Never Used  Vaping Use  . Vaping Use: Never used  Substance and Sexual Activity  . Alcohol use: No    Alcohol/week: 0.0 standard drinks  . Drug use: No  . Sexual activity: Not on file  Other Topics Concern  . Not on file  Social History Narrative   Patient lives at home alone.   Caffeine Use: very little, rarely   Social Determinants of Health   Financial Resource Strain:   . Difficulty of Paying Living Expenses: Not on file  Food Insecurity:   . Worried About Charity fundraiser in the Last Year: Not on file  . Ran Out of Food in the Last Year: Not on file  Transportation Needs:   . Lack of Transportation (Medical): Not on file  . Lack of Transportation (Non-Medical): Not on file  Physical Activity:   . Days of Exercise per Week: Not on file  . Minutes of Exercise per Session: Not on file  Stress:   . Feeling of Stress : Not on file  Social Connections:   . Frequency of Communication with Friends and Family: Not on file  . Frequency of Social Gatherings with Friends and Family: Not on file  . Attends Religious Services: Not on file  . Active Member of Clubs or Organizations: Not on file  . Attends Archivist Meetings: Not on file  . Marital Status: Not on file  Intimate Partner  Violence:   . Fear of Current or Ex-Partner: Not on file  . Emotionally Abused: Not on file  . Physically Abused: Not on file  . Sexually Abused: Not on file   Family History  Problem Relation Age of Onset  . Stroke Mother   . Heart failure Father   . Diabetes Brother     OBJECTIVE:  Vitals:   06/24/20 1730 06/24/20 1731  BP: Marland Kitchen)  190/69   Pulse: 63   Resp: 18   Temp: 97.8 F (36.6 C)   TempSrc: Oral   SpO2: 98%   Weight:  127 lb 13.9 oz (58 kg)  Height:  5\' 7"  (1.702 m)   General appearance: Alert; in no acute distress HEENT: NCAT.  Oropharynx clear.  Lungs: clear to auscultation bilaterally without adventitious breath sounds Heart: regular rate and rhythm.   Abdomen: soft; non-distended; no tenderness; bowel sounds present; no guarding Back: no CVA tenderness Extremities: no edema; symmetrical with no gross deformities Skin: warm and dry Neurologic: Ambulates from chair to exam table without difficulty Psychological: alert and cooperative; normal mood and affect  Labs Reviewed  POCT URINALYSIS DIP (MANUAL ENTRY) - Abnormal; Notable for the following components:      Result Value   Clarity, UA cloudy (*)    Blood, UA moderate (*)    Leukocytes, UA Large (3+) (*)    All other components within normal limits  URINE CULTURE    ASSESSMENT & PLAN:  1. Urinary frequency   2. Dysuria     Meds ordered this encounter  Medications  . cephALEXin (KEFLEX) 500 MG capsule    Sig: Take 1 capsule (500 mg total) by mouth 2 (two) times daily.    Dispense:  20 capsule    Refill:  0    Order Specific Question:   Supervising Provider    Answer:   Raylene Everts [[id number]]   Urine concerning for UTI Urine culture sent.  We will call you with the results.   Push fluids and get plenty of rest.   Take antibiotic as directed and to completion Follow up with PCP next week for recheck Return here or go to ER if you have any new or worsening symptoms such as fever, worsening  abdominal pain, nausea/vomiting, flank pain, etc...  Patient requests antibiotic shot  Outlined signs and symptoms indicating need for more acute intervention. Patient verbalized understanding. After Visit Summary given.     Lestine Box, PA-C 06/24/20 1832

## 2020-06-24 NOTE — ED Triage Notes (Signed)
Urinary frequency and burning that started today.  Pt has hx of frequent uti's.

## 2020-06-24 NOTE — Telephone Encounter (Signed)
Family calling to check on this

## 2020-06-24 NOTE — Telephone Encounter (Signed)
Discussed with daughter [name](DPR) who stated they are at urgent care trying to get a urine but she has not been successful yet. Advised daughter to schedule urgent care follow up with Dr Nicki Reaper.

## 2020-06-24 NOTE — Telephone Encounter (Signed)
Needs appt.  Having recurrent issue.  Needing urine with culture to see which antibiotic is sensitive to the bacteria.  Dr. Lovena Le

## 2020-06-24 NOTE — Discharge Instructions (Signed)
Urine concerning for UTI Urine culture sent.  We will call you with the results.   Push fluids and get plenty of rest.   Take antibiotic as directed and to completion Follow up with PCP next week for recheck Return here or go to ER if you have any new or worsening symptoms such as fever, worsening abdominal pain, nausea/vomiting, flank pain, etc..Marland Kitchen

## 2020-06-26 LAB — URINE CULTURE: Culture: <10000 — AB

## 2020-06-29 ENCOUNTER — Other Ambulatory Visit: Payer: Self-pay | Admitting: Family Medicine

## 2020-06-29 DIAGNOSIS — N8110 Cystocele, unspecified: Secondary | ICD-10-CM | POA: Diagnosis not present

## 2020-06-29 DIAGNOSIS — Z4689 Encounter for fitting and adjustment of other specified devices: Secondary | ICD-10-CM | POA: Diagnosis not present

## 2020-07-10 DIAGNOSIS — Z87440 Personal history of urinary (tract) infections: Secondary | ICD-10-CM | POA: Diagnosis not present

## 2020-07-10 DIAGNOSIS — N814 Uterovaginal prolapse, unspecified: Secondary | ICD-10-CM | POA: Diagnosis not present

## 2020-07-10 DIAGNOSIS — Z4689 Encounter for fitting and adjustment of other specified devices: Secondary | ICD-10-CM | POA: Diagnosis not present

## 2020-07-16 DIAGNOSIS — Z87440 Personal history of urinary (tract) infections: Secondary | ICD-10-CM | POA: Diagnosis not present

## 2020-07-16 DIAGNOSIS — L900 Lichen sclerosus et atrophicus: Secondary | ICD-10-CM | POA: Diagnosis not present

## 2020-07-16 DIAGNOSIS — Z4689 Encounter for fitting and adjustment of other specified devices: Secondary | ICD-10-CM | POA: Diagnosis not present

## 2020-07-16 DIAGNOSIS — N814 Uterovaginal prolapse, unspecified: Secondary | ICD-10-CM | POA: Diagnosis not present

## 2020-08-06 ENCOUNTER — Telehealth: Payer: Self-pay | Admitting: Cardiology

## 2020-08-06 NOTE — Telephone Encounter (Signed)
Advised that samples are not available

## 2020-08-06 NOTE — Telephone Encounter (Signed)
Patient calling the office for samples of medication: °  °  °1.  What medication and dosage are you requesting samples for?  Rivaroxaban (XARELTO) 15 MG TABS tablet  °  °  °  °2.  Are you currently out of this medication?  °  °

## 2020-08-13 DIAGNOSIS — L110 Acquired keratosis follicularis: Secondary | ICD-10-CM | POA: Diagnosis not present

## 2020-08-13 DIAGNOSIS — M79671 Pain in right foot: Secondary | ICD-10-CM | POA: Diagnosis not present

## 2020-08-13 DIAGNOSIS — I739 Peripheral vascular disease, unspecified: Secondary | ICD-10-CM | POA: Diagnosis not present

## 2020-08-13 DIAGNOSIS — M79674 Pain in right toe(s): Secondary | ICD-10-CM | POA: Diagnosis not present

## 2020-08-13 DIAGNOSIS — M79672 Pain in left foot: Secondary | ICD-10-CM | POA: Diagnosis not present

## 2020-08-13 DIAGNOSIS — M79675 Pain in left toe(s): Secondary | ICD-10-CM | POA: Diagnosis not present

## 2020-09-21 ENCOUNTER — Telehealth: Payer: Self-pay | Admitting: Cardiology

## 2020-09-21 NOTE — Telephone Encounter (Signed)
Xarelto (#14)  Lot #: [id number] Exp:02/24  Will send a message to Pharm D. Patient has not qualified for patient assistance 3 times due to making $200.00 too much a month.

## 2020-09-21 NOTE — Telephone Encounter (Signed)
Needing samples of Rivaroxaban (XARELTO) 15 MG TABS tablet [[id number]]    [phone]

## 2020-09-22 DIAGNOSIS — R300 Dysuria: Secondary | ICD-10-CM | POA: Diagnosis not present

## 2020-09-22 DIAGNOSIS — N814 Uterovaginal prolapse, unspecified: Secondary | ICD-10-CM | POA: Diagnosis not present

## 2020-09-22 DIAGNOSIS — R8290 Unspecified abnormal findings in urine: Secondary | ICD-10-CM | POA: Diagnosis not present

## 2020-09-22 DIAGNOSIS — N3000 Acute cystitis without hematuria: Secondary | ICD-10-CM | POA: Diagnosis not present

## 2020-09-23 DIAGNOSIS — M79671 Pain in right foot: Secondary | ICD-10-CM | POA: Diagnosis not present

## 2020-09-23 DIAGNOSIS — I739 Peripheral vascular disease, unspecified: Secondary | ICD-10-CM | POA: Diagnosis not present

## 2020-09-23 DIAGNOSIS — L110 Acquired keratosis follicularis: Secondary | ICD-10-CM | POA: Diagnosis not present

## 2020-09-23 DIAGNOSIS — M79674 Pain in right toe(s): Secondary | ICD-10-CM | POA: Diagnosis not present

## 2020-09-23 DIAGNOSIS — M79672 Pain in left foot: Secondary | ICD-10-CM | POA: Diagnosis not present

## 2020-09-23 DIAGNOSIS — M79675 Pain in left toe(s): Secondary | ICD-10-CM | POA: Diagnosis not present

## 2020-09-25 ENCOUNTER — Telehealth: Payer: Self-pay | Admitting: Cardiology

## 2020-09-25 ENCOUNTER — Telehealth: Payer: Medicare Other | Admitting: Family Medicine

## 2020-09-25 NOTE — Telephone Encounter (Signed)
Patient's daughter would like to know if the appointment 12/30/2020 can be virtual since she is not having any problems.

## 2020-09-25 NOTE — Telephone Encounter (Signed)
Yes as long as it can be video

## 2020-09-28 NOTE — Telephone Encounter (Signed)
Spoke with daughter. Only way appointment can be made virtual is if patient has video capability. Daughter will call back to change appointment to virtual if she can do so.

## 2020-09-30 ENCOUNTER — Encounter: Payer: Self-pay | Admitting: Family Medicine

## 2020-09-30 ENCOUNTER — Telehealth: Payer: Self-pay | Admitting: Cardiology

## 2020-09-30 ENCOUNTER — Telehealth: Payer: Self-pay

## 2020-09-30 NOTE — Telephone Encounter (Signed)
Pt daughter called needs a letter from Dr Nicki Reaper that she can take to the city for someone to come get pt trash can  She is physically unable to take trash can to [place] daughter [phone]

## 2020-09-30 NOTE — Telephone Encounter (Signed)
Noted and updated-thanks

## 2020-09-30 NOTE — Telephone Encounter (Signed)
New message:     Patient daughter calling stating that they will just come in for the patient apt instead of VV.

## 2020-09-30 NOTE — Telephone Encounter (Signed)
A letter was completed.

## 2020-10-08 ENCOUNTER — Ambulatory Visit (INDEPENDENT_AMBULATORY_CARE_PROVIDER_SITE_OTHER): Payer: Medicare Other | Admitting: Family Medicine

## 2020-10-08 ENCOUNTER — Encounter: Payer: Self-pay | Admitting: Family Medicine

## 2020-10-08 ENCOUNTER — Telehealth: Payer: Medicare Other | Admitting: Family Medicine

## 2020-10-08 ENCOUNTER — Other Ambulatory Visit: Payer: Self-pay

## 2020-10-08 VITALS — BP 134/64 | HR 60 | Wt 146.6 lb

## 2020-10-08 DIAGNOSIS — M791 Myalgia, unspecified site: Secondary | ICD-10-CM | POA: Insufficient documentation

## 2020-10-08 DIAGNOSIS — T466X5A Adverse effect of antihyperlipidemic and antiarteriosclerotic drugs, initial encounter: Secondary | ICD-10-CM | POA: Insufficient documentation

## 2020-10-08 DIAGNOSIS — M1711 Unilateral primary osteoarthritis, right knee: Secondary | ICD-10-CM

## 2020-10-08 DIAGNOSIS — N1832 Chronic kidney disease, stage 3b: Secondary | ICD-10-CM | POA: Diagnosis not present

## 2020-10-08 DIAGNOSIS — I700 Atherosclerosis of aorta: Secondary | ICD-10-CM

## 2020-10-08 DIAGNOSIS — N390 Urinary tract infection, site not specified: Secondary | ICD-10-CM | POA: Diagnosis not present

## 2020-10-08 DIAGNOSIS — R251 Tremor, unspecified: Secondary | ICD-10-CM

## 2020-10-08 DIAGNOSIS — N289 Disorder of kidney and ureter, unspecified: Secondary | ICD-10-CM

## 2020-10-08 DIAGNOSIS — I480 Paroxysmal atrial fibrillation: Secondary | ICD-10-CM | POA: Diagnosis not present

## 2020-10-08 DIAGNOSIS — F411 Generalized anxiety disorder: Secondary | ICD-10-CM | POA: Diagnosis not present

## 2020-10-08 DIAGNOSIS — R300 Dysuria: Secondary | ICD-10-CM

## 2020-10-08 LAB — POCT URINALYSIS DIPSTICK
Spec Grav, UA: <=1.005 — AB (ref 1.010–1.025)
pH, UA: 6 (ref 5.0–8.0)

## 2020-10-08 NOTE — Progress Notes (Signed)
   Subjective:    Patient ID: Becky Baird, female    DOB: 1933-11-02, 87 y.o.   MRN: [id number]  HPI Pt here for follow up and follow up on UTI. Pt was placed on Bactrim by GYN for UTI.   Pt also have knee pain; right is worse than left. Pt states "may be arthritis".   Pt had an "ulcer" of left foot (next to little toe). Pt file callus with emory board and ended up in podiatry office.  Long discussion held today.  Family is very nice patient is very nice having some problems with some swelling in the lower legs at times.  Also having problems with dysuria urinary frequency using a pessary has questions regarding taking suppressive antibiotics.  We did discuss the occurrence of UTIs in older individuals and what to watch for in regards to warning signs patient not having any chest tightness pressure pain or shortness of breath   Review of Systems Patient relates right knee pain and discomfort relates some back stiffness relates dysuria urinary frequency denies any severe pain    Objective:   Physical Exam  Lungs clear heart rate is normal decent regular rate currently extremities trace edema skin warm dry Not orthostatic.  Patient walks with a cane she is half bent over and tilted to the side because of orthopedic issues. 45 minutes spent with patient care and with documentation    Assessment & Plan:  1. Dysuria We will go ahead and do a culture.  I tried to discern to them that it is not unusual to have bacteriuria on a regular basis.  Patient does use a pessary.  Patient is concerned about frequent UTIs.  I advised against daily antibiotics.  Await culture - POCT Urinalysis Dipstick - Urine Culture - Urine Microalbumin w/creat. ratio  2. Renal insufficiency Patient has chronic kidney disease stage III/early stage IV.  Avoid all anti-inflammatories.  Healthy eating drink plenty of water.  Monitor closely. - POCT Urinalysis Dipstick - Urine Culture - Urine Microalbumin w/creat.  ratio - Basic Metabolic Panel (BMET)  3. Aortic atherosclerosis ([place]) On previous test this was detected patient does not tolerate statins is on red rice yeast extract.  4. Paroxysmal atrial fibrillation ([facility]) Not having any severe flareup today heart rate normal  5. Frequent UTI Frequent UTIs not unusual for her age avoid daily antibiotics  6. Primary osteoarthritis of right knee Patient currently prefers just to watch things and use a cane if it gets worse she will follow-up  7. Tremor Mild tremor in the hands not as severe no sign of Parkinson's currently.  8. Myalgia due to statin Patient does not tolerate statins she is taken before and had severe myalgia  9. GAD (generalized anxiety disorder) Mild anxiety related issues uses Xanax I encouraged her to only use it on a as needed basis she states she only takes it a couple times per day but she gets 30 tablets every month but she states that she keeps the extras just in case she needs them.  This is not a healthy habit.  I have encouraged her to follow-up within a few months and bring all of her pill bottles for review.

## 2020-10-09 ENCOUNTER — Telehealth: Payer: Self-pay | Admitting: Family Medicine

## 2020-10-09 LAB — BASIC METABOLIC PANEL
BUN/Creatinine Ratio: 17 (ref 12–28)
BUN: 23 mg/dL (ref 8–27)
CO2: 22 mmol/L (ref 20–29)
Calcium: 9.8 mg/dL (ref 8.7–10.3)
Chloride: 98 mmol/L (ref 96–106)
Creatinine, Ser: 1.38 mg/dL — ABNORMAL HIGH (ref 0.57–1.00)
Glucose: 87 mg/dL (ref 65–99)
Potassium: 4.1 mmol/L (ref 3.5–5.2)
Sodium: 138 mmol/L (ref 134–144)
eGFR: 38 mL/min/{1.73_m2} — ABNORMAL LOW (ref >59)

## 2020-10-09 LAB — MICROALBUMIN / CREATININE URINE RATIO
Creatinine, Urine: 24.5 mg/dL
Microalb/Creat Ratio: 18 mg/g creat (ref 0–29)
Microalbumin, Urine: 4.5 ug/mL

## 2020-10-09 NOTE — Telephone Encounter (Signed)
Daughter is wanting to know what type of brace can she get for patient right knee pain that over the counter . Please advise

## 2020-10-11 LAB — URINE CULTURE

## 2020-10-12 NOTE — Telephone Encounter (Signed)
Left message to return call 

## 2020-10-12 NOTE — Telephone Encounter (Signed)
I would start off with a neoprene sleeve or strap on but only when active.  Otherwise it will trigger some swelling in the lower leg from the construction.  I would not recommend a knee brace with hinges or metal rods. If her knee progressively gets worse we can help set up with orthopedics if she is interested.  More than likely they would try a shot for temporary help

## 2020-10-13 NOTE — Telephone Encounter (Signed)
Discussed with pt's daughter [name] and she verbalized understanding.

## 2020-10-26 ENCOUNTER — Other Ambulatory Visit: Payer: Self-pay | Admitting: Family Medicine

## 2020-10-27 ENCOUNTER — Ambulatory Visit (INDEPENDENT_AMBULATORY_CARE_PROVIDER_SITE_OTHER): Payer: Medicare Other | Admitting: Family Medicine

## 2020-10-27 ENCOUNTER — Other Ambulatory Visit: Payer: Self-pay | Admitting: *Deleted

## 2020-10-27 ENCOUNTER — Other Ambulatory Visit: Payer: Self-pay

## 2020-10-27 ENCOUNTER — Other Ambulatory Visit (HOSPITAL_COMMUNITY)
Admission: RE | Admit: 2020-10-27 | Discharge: 2020-10-27 | Disposition: A | Discharge status: Home / Self Care | Payer: Medicare Other | Source: Ambulatory Visit | Attending: Family Medicine | Admitting: Family Medicine

## 2020-10-27 ENCOUNTER — Ambulatory Visit (HOSPITAL_COMMUNITY)
Admission: RE | Admit: 2020-10-27 | Discharge: 2020-10-27 | Disposition: A | Discharge status: Home / Self Care | Payer: Medicare Other | Source: Ambulatory Visit | Attending: Family Medicine | Admitting: Family Medicine

## 2020-10-27 ENCOUNTER — Encounter: Payer: Self-pay | Admitting: Family Medicine

## 2020-10-27 VITALS — BP 151/58 | HR 52 | Temp 97.7°F | Ht 67.0 in | Wt 142.0 lb

## 2020-10-27 DIAGNOSIS — M79604 Pain in right leg: Secondary | ICD-10-CM | POA: Diagnosis not present

## 2020-10-27 DIAGNOSIS — M79661 Pain in right lower leg: Secondary | ICD-10-CM | POA: Insufficient documentation

## 2020-10-27 DIAGNOSIS — R600 Localized edema: Secondary | ICD-10-CM | POA: Diagnosis not present

## 2020-10-27 DIAGNOSIS — M7989 Other specified soft tissue disorders: Secondary | ICD-10-CM | POA: Insufficient documentation

## 2020-10-27 DIAGNOSIS — R7989 Other specified abnormal findings of blood chemistry: Secondary | ICD-10-CM

## 2020-10-27 DIAGNOSIS — M79671 Pain in right foot: Secondary | ICD-10-CM | POA: Diagnosis not present

## 2020-10-27 DIAGNOSIS — M25571 Pain in right ankle and joints of right foot: Secondary | ICD-10-CM | POA: Diagnosis not present

## 2020-10-27 LAB — D-DIMER, QUANTITATIVE: D-Dimer, Quant: 0.62 ug/mL-FEU — ABNORMAL HIGH (ref 0.00–0.50)

## 2020-10-27 NOTE — Progress Notes (Signed)
Patient ID: Becky Baird, female    DOB: 01/24/1934, 87 y.o.   MRN: [id number]   Chief Complaint  Patient presents with  . Foot Swelling   Subjective:  CC; right ankle/foot/calf pain and swelling  This is a new problem.  Presents today with right lower extremity/ankle/foot swelling, erythema, and pain.  Symptoms have been present for 3 to 4 days.  Denies any injury.  Reports calf pain and pain with weightbearing.  Denies recent fever or chills.    pt arrives with daughter Becky Baird.   pain and swelling in right foot and ankle. Started 3 days ago.    Medical History Becky Baird has a past medical history of Anxiety, Chronic back pain, Hyperlipidemia, Hypertension, Memory loss, Mild cognitive impairment, so stated, Occlusion and stenosis of carotid artery with cerebral infarction, Persistent atrial fibrillation ([place]) (06/23/2016), Renal cyst, Sciatica, Stroke ([place]), Toe fracture (11/2015), and Ulcer (1970s).   Outpatient Encounter Medications as of 10/27/2020  Medication Sig  . ALPRAZolam (XANAX) 0.25 MG tablet TAKE ONE TABLET BY MOUTH AT LUNCHTIME AS NEEDED, ONE TABLET IN THE AFTERNOON AS NEEDED, AND ONE TABLET AT BEDTIME AS NEEDED  . clobetasol ointment (TEMOVATE) 5.88 % Apply 1 application topically 2 (two) times daily as needed.  Marland Kitchen estradiol (ESTRACE) 0.1 MG/GM vaginal cream Place 1 Applicatorful vaginally once a week.  . feeding supplement (BOOST HIGH PROTEIN) LIQD Take 1 Container by mouth 3 (three) times daily between meals.  . feeding supplement, GLUCERNA SHAKE, (GLUCERNA SHAKE) LIQD Take 237 mLs by mouth 3 (three) times daily between meals.   . fish oil-omega-3 fatty acids 1000 MG capsule Take 2 g by mouth 2 (two) times a day.   . hydrochlorothiazide (HYDRODIURIL) 12.5 MG tablet TAKE ONE TABLET BY MOUTH DAILY  . Ketotifen Fumarate (ALAWAY OP) Apply 1 drop to eye daily as needed.  . loratadine (CLARITIN) 10 MG tablet Take 1 tablet (10 mg total) by mouth daily. (Patient taking  differently: Take 10 mg by mouth at bedtime.)  . OVER THE COUNTER MEDICATION Take 1 capsule by mouth every morning. Platinum  . OVER THE COUNTER MEDICATION Take by mouth daily. Vitamin d3  . pantoprazole (PROTONIX) 40 MG tablet TAKE ONE TABLET BY MOUTH DAILY  . potassium chloride (KLOR-CON) 10 MEQ tablet TAKE ONE TABLET BY MOUTH TWICE DAILY  . Red Yeast Rice 600 MG CAPS Take 1,200 mg by mouth 2 (two) times a day.   . Rivaroxaban (XARELTO) 15 MG TABS tablet TAKE ONE TABLET BY MOUTH DAILY WITH SUPPER.  . sodium chloride (OCEAN) 0.65 % nasal spray Place 1 spray into the nose as needed for congestion.   . [DISCONTINUED] MYRBETRIQ 50 MG TB24 tablet Take 50 mg by mouth daily.   No facility-administered encounter medications on file as of 10/27/2020.     Review of Systems  Constitutional: Negative for chills and fever.  Respiratory: Negative for shortness of breath.   Cardiovascular: Positive for leg swelling. Negative for chest pain.  Skin: Positive for color change.       Right lower extremity with erythema and swelling.      Vitals BP (!) 151/58   Pulse (!) 52   Temp 97.7 F (36.5 C)   Ht 5\' 7"  (1.702 m)   Wt 142 lb (64.4 kg)   SpO2 98%   BMI 22.24 kg/m   Objective:   Physical Exam Vitals reviewed.  Constitutional:      Appearance: Normal appearance.  Cardiovascular:     Rate  and Rhythm: Normal rate and regular rhythm.     Heart sounds: Murmur heard.    Pulmonary:     Effort: Pulmonary effort is normal.     Breath sounds: Normal breath sounds.  Abdominal:     General: Bowel sounds are normal.  Musculoskeletal:     Comments: Right lower extremity with erythema and significant swelling in right ankle and top of right foot. Right calf pain.  Difficult to bear weight. Unknown injury.   Skin:    General: Skin is warm and dry.  Neurological:     General: No focal deficit present.     Mental Status: She is alert.  Psychiatric:        Behavior: Behavior normal.       Assessment and Plan   1. Right calf pain  2. Right leg swelling   Concerned for DVT and/or right ankle/foot fracture. There is significant swelling in right ankle and top of foot with associated calf pain. Will send to [facility] for d-dimer, ultrasound, and x-rays.  Update: Foot and ankle x-rays negative for fracture.  Ankle with significant soft tissue swelling.  Right lower extremity, negative for deep vein thrombosis.  D-dimer slightly elevated.  Nurse notified patient, due to slight elevation in D-dimer, will repeat ultrasound to ensure no DVT in 5 days.  Recommend RICE for soft tissue swelling of the right ankle.    Agrees with plan of care discussed today. Understands warning signs to seek further care: chest pain, shortness of breath, any significant change in health.  Understands to follow-up in 5 days for repeat right lower extremity ultrasound, sooner if anything changes.  Recommend RICE for supportive therapy of right ankle tissue swelling.  Results of x-rays and ultrasound given.    Pecolia Ades, NP 10/27/2020

## 2020-10-27 NOTE — Patient Instructions (Signed)
RICE Therapy for Routine Care of Injuries Many injuries can be cared for with rest, ice, compression, and elevation (RICE therapy). This includes:  Resting the injured body part.  Putting ice on the injury.  Putting pressure (compression) on the injury.  Raising the injured part (elevation). Using RICE therapy can help to lessen pain and swelling. Supplies needed:  Ice.  Plastic bag.  Towel.  Elastic bandage.  Pillow or pillows to raise your injured body part. How to care for your injury with RICE therapy Rest Try to rest the injured part of your body. You can go back to your normal activities when your doctor says it is okay to do them and when you can do them without pain. If you rest the injury too much, it may not heal as well. Some injuries heal better with early movement instead of resting for too long. Ask your doctor if you should do exercises to help your injury get better. Ice  If told, put ice on the injured area. To do this: ? Put ice in a plastic bag. ? Place a towel between your skin and the bag. ? Leave the ice on for 20 minutes, 2-3 times a day. ? Take off the ice if your skin turns bright red. This is very important. If you cannot feel pain, heat, or cold, you have a greater risk of damage to the area.  Do not put ice on your bare skin. Use ice for as many days as your doctor tells you to use it.   Compression Put pressure on the injured area. This can be done with an elastic bandage. If this type of bandage has been put on your injury:  Follow instructions on the package the bandage came in about how to use it.  Do not wrap the bandage too tightly. ? Wrap the bandage more loosely if part of your body beyond the bandage is blue, swollen, cold, painful, or loses feeling.  Take off the bandage and put it on again every 3-4 hours or as told by your doctor.  See your doctor if the bandage seems to make your problems worse.   Elevation Raise the injured area  above the level of your heart while you are sitting or lying down. Follow these instructions at home:  If your symptoms get worse or last a long time, make a follow-up appointment with your doctor. You may need to have imaging tests, such as X-rays or an MRI.  If you have imaging tests, ask how to get your results when they are ready.  Return to your normal activities when your doctor says that it is safe.  Keep all follow-up visits. Contact a doctor if:  You keep having pain and swelling.  Your symptoms get worse. Get help right away if:  You have sudden, very bad pain at your injury or lower than your injury.  You have redness or more swelling around your injury.  You have tingling or numbness at your injury or lower than your injury, and it does not go away when you take off the bandage. Summary  Many injuries can be cared for using rest, ice, compression, and elevation (RICE therapy).  You can go back to your normal activities when your doctor says it is okay and when you can do them without pain.  Put ice on the injured area as told by your doctor.  Get help if your symptoms get worse or if you keep having pain and swelling.  This information is not intended to replace advice given to you by your health care provider. Make sure you discuss any questions you have with your health care provider. Document Revised: 04/30/2020 Document Reviewed: 04/30/2020 Elsevier Patient Education  2021 [employer].

## 2020-10-30 ENCOUNTER — Telehealth: Payer: Self-pay

## 2020-10-30 NOTE — Telephone Encounter (Signed)
Daughter [name](DPR) was advised per Santiago Glad NP: Continue with RICE therapy until Monday. Daughter verbalized understanding.

## 2020-10-30 NOTE — Telephone Encounter (Signed)
Patient still having swelling in her leg but ankle has gone down. Daughter -[name] wants to know if they need to continue icing and keep leg up for swelling. She has ultra sound on Monday. Please advise on what to down over the weekend.

## 2020-10-30 NOTE — Telephone Encounter (Signed)
Continue with RICE therapy until Monday. KD

## 2020-10-30 NOTE — Telephone Encounter (Signed)
Left message to return call 

## 2020-11-02 ENCOUNTER — Telehealth: Payer: Self-pay

## 2020-11-02 ENCOUNTER — Encounter: Payer: Self-pay | Admitting: Family Medicine

## 2020-11-02 ENCOUNTER — Ambulatory Visit (HOSPITAL_COMMUNITY)
Admission: RE | Admit: 2020-11-02 | Discharge: 2020-11-02 | Disposition: A | Discharge status: Home / Self Care | Payer: Medicare Other | Source: Ambulatory Visit | Attending: Family Medicine | Admitting: Family Medicine

## 2020-11-02 DIAGNOSIS — M7989 Other specified soft tissue disorders: Secondary | ICD-10-CM | POA: Diagnosis not present

## 2020-11-02 DIAGNOSIS — M79604 Pain in right leg: Secondary | ICD-10-CM | POA: Diagnosis present

## 2020-11-02 DIAGNOSIS — R7989 Other specified abnormal findings of blood chemistry: Secondary | ICD-10-CM | POA: Insufficient documentation

## 2020-11-02 NOTE — Telephone Encounter (Signed)
Repeat venous ultrasound was completed today at 1:30 pm at [facility]

## 2020-11-02 NOTE — Telephone Encounter (Signed)
Message sent to referral coordinator. U/s order put in last week with a note that she needs to do it today due to elevated d dimer.

## 2020-11-02 NOTE — Telephone Encounter (Signed)
Referral for a Ultra Sound daughter wants to know if this is today or or tomorrow?   Threasa Alpha -[phone]

## 2020-11-03 ENCOUNTER — Other Ambulatory Visit: Payer: Self-pay | Admitting: *Deleted

## 2020-11-03 ENCOUNTER — Telehealth: Payer: Self-pay | Admitting: Cardiology

## 2020-11-03 DIAGNOSIS — M79661 Pain in right lower leg: Secondary | ICD-10-CM

## 2020-11-03 DIAGNOSIS — M7989 Other specified soft tissue disorders: Secondary | ICD-10-CM

## 2020-11-03 DIAGNOSIS — L03115 Cellulitis of right lower limb: Secondary | ICD-10-CM | POA: Diagnosis not present

## 2020-11-03 DIAGNOSIS — M79604 Pain in right leg: Secondary | ICD-10-CM

## 2020-11-03 NOTE — Telephone Encounter (Signed)
Patient's daughter called in to say that her mother has swelling in both of her legs called to get her seen asap but we only had appt for May 6. Please advise

## 2020-11-03 NOTE — Telephone Encounter (Signed)
Spoke with pt's daughter, Dolan Amen (ok per DPR) regarding pt having swelling in her lower extremities. [name] says that over the last week or so they have been trying to figure out what's going on with her mom's right leg. 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 Pt has had 2 venous dopplers to check for DVT and both have been negative. Pt also had d-dimer drawn which came back mildly elevated. Pt is having trouble putting weight on the right leg. Pt seen at her family practice by Pecolia Ades, NP who ordered x-rays of the ankle as well as 2 venous dopplers. X-ray does not show signs of fracture. Pt has now been sent to see orthopedic. Daughter is concerned now because the left leg is showing signs of swelling as well, not as bad as the right leg. Daughter would like to know if there could be a cardiac component to the swelling that the pt is experiencing. Per echo in 10/2019, EF was normal with no major structural issues noted.

## 2020-11-03 NOTE — Telephone Encounter (Signed)
Called patient's daughter [name] left message on personal voice mail to call back.

## 2020-11-04 ENCOUNTER — Telehealth: Payer: Self-pay | Admitting: Orthopedic Surgery

## 2020-11-04 NOTE — Telephone Encounter (Signed)
Pts daughter [name] called wanting to get some more info on the sleeve Dr. Sharol Given will be applying at the appt on 11/05/20. She states she would like to know if there's anything special she needs to do prior to the appt? [name] would like a CB  [phone]

## 2020-11-04 NOTE — Telephone Encounter (Signed)
I called and pt's daughter wanted to know if Dr. Sharol Given wants to use a compression sock for treatment of patient will she be able to remove in order to shower. I advised that the sock is to be work 24/7 but yes can remove to shower and then reapply. Pt has an appt tomorrow and will call with any additional questions before then.

## 2020-11-05 ENCOUNTER — Ambulatory Visit (INDEPENDENT_AMBULATORY_CARE_PROVIDER_SITE_OTHER): Payer: Medicare Other | Admitting: Orthopedic Surgery

## 2020-11-05 ENCOUNTER — Other Ambulatory Visit: Payer: Self-pay

## 2020-11-05 ENCOUNTER — Encounter: Payer: Self-pay | Admitting: Orthopedic Surgery

## 2020-11-05 DIAGNOSIS — M25571 Pain in right ankle and joints of right foot: Secondary | ICD-10-CM | POA: Diagnosis not present

## 2020-11-06 LAB — URIC ACID: Uric Acid, Serum: 6.9 mg/dL (ref 2.5–7.0)

## 2020-11-08 NOTE — Telephone Encounter (Signed)
She has an appt in May.  I don't think I could evaluate this without seeing her.  She can try to move up the appt with an APP.

## 2020-11-09 ENCOUNTER — Encounter: Payer: Self-pay | Admitting: Orthopedic Surgery

## 2020-11-09 NOTE — Progress Notes (Signed)
Office Visit Note   Patient: Becky Baird           Date of Birth: [birth year]           MRN: [id number] Visit Date: 11/05/2020              Requested by: Kathyrn Drown, MD [address],  [place] [id number] PCP: Kathyrn Drown, MD  Chief Complaint  Patient presents with  . Right Ankle - Pain, Edema      HPI: Patient is an 87 year old woman who is seen for initial evaluation for right foot and ankle pain.  Patient has had a right foot and ankle x-ray April 5.  Review of these radiographs shows some calcification of the arteries to the hindfoot no fractures and diffuse osteopenia.  Patient is currently ambulating with a cane denies any specific injuries.  Patient states she started having swelling and redness and started antibiotics yesterday she states she has burning in both legs.  Patient had an ultrasound that was negative for DVT D-dimer was 0.6 normal is up to 0.5.  Patient states she started having swelling in her leg when it was dependent for about 4 hours.  She is currently on Keflex.  She is also on Xarelto for atrial fibrillation and a history of stroke affecting the left side.  Patient states she also has a stent in her brain after the stroke.  Assessment & Plan: Visit Diagnoses:  1. Pain in right ankle and joints of right foot     Plan: Patient was placed in a knee-high compression stocking 20 to 30 mm of compression size medium.  We will obtain a uric acid level today.  Follow-Up Instructions: Return in about 3 weeks (around 11/26/2020).   Ortho Exam  Patient is alert, oriented, no adenopathy, well-dressed, normal affect, normal respiratory effort. Examination patient has palpable dorsalis pedis pulses bilaterally she has swelling with venous stasis changes in both lower extremities with swelling worse in the right.  There is no cellulitis no signs of infection.  Patient's calf measures 34 cm in circumference she denies a history of gout.  The calf  is soft nontender to palpation.  She does have pain to palpation across the midfoot and ankle.  Radiographs do not show any periarticular cystic changes.  Imaging: No results found.    Labs: Lab Results  Component Value Date   HGBA1C 5.7 (H) 11/10/2019   HGBA1C 5.6 11/23/2017   HGBA1C 5.8 (H) 10/31/2016   ESRSEDRATE 9 04/19/2017   ESRSEDRATE 4 10/31/2016   CRP 0.8 04/19/2017   LABURIC 6.9 11/05/2020   REPTSTATUS 06/26/2020 FINAL 06/24/2020   CULT (A) 06/24/2020    <10,000 COLONIES/mL INSIGNIFICANT GROWTH Performed at [facility], [place] [address] [place], [place] [address]    LABORGA ESCHERICHIA COLI (A) 11/10/2019     Lab Results  Component Value Date   ALBUMIN 4.2 11/10/2019   ALBUMIN 3.3 (L) 04/07/2018   ALBUMIN 3.5 11/28/2017    Lab Results  Component Value Date   MG 1.8 04/07/2018   MG 2.2 06/03/2009   Lab Results  Component Value Date   VD25OH 50 05/29/2014    No results found for: PREALBUMIN CBC EXTENDED Latest Ref Rng & Units 04/24/2020 11/10/2019 12/13/2018  WBC 3.4 - 10.8 x10E3/uL 6.0 6.5 -  RBC 3.77 - 5.28 x10E6/uL 4.09 3.73(L) -  HGB 11.1 - 15.9 g/dL 12.8 11.7(L) 12.5  HCT 34.0 - 46.6 % 38.2  35.4(L) -  PLT 150 - 450 x10E3/uL 253 200 -  NEUTROABS 1.4 - 7.0 x10E3/uL 3.6 3.6 -  LYMPHSABS 0.7 - 3.1 x10E3/uL 1.6 2.0 -     There is no height or weight on file to calculate BMI.  Orders:  Orders Placed This Encounter  Procedures  . Uric acid   No orders of the defined types were placed in this encounter.    Procedures: No procedures performed  Clinical Data: No additional findings.  ROS:  All other systems negative, except as noted in the HPI. Review of Systems  Objective: Vital Signs: There were no vitals taken for this visit.  Specialty Comments:  No specialty comments available.  PMFS History: Patient Active Problem List   Diagnosis Date Noted  . Right calf pain 10/27/2020  . Right leg swelling 10/27/2020  . Myalgia due  to statin 10/08/2020  . Aortic atherosclerosis ([place]) 04/28/2020  . Stenosis of carotid artery 12/03/2019  . Bradycardia 12/04/2018  . Nonrheumatic aortic valve stenosis 12/04/2018  . Acute metabolic encephalopathy 56/43/3295  . Pyelonephritis 04/07/2018  . Acute right-sided low back pain with right-sided sciatica 11/24/2017  . Unsteady gait 11/24/2017  . Primary osteoarthritis of both hands 11/13/2017  . Paroxysmal atrial fibrillation ([place]) 11/23/2016  . Murmur 11/23/2016  . TIA (transient ischemic attack) 05/12/2016  . Urinary tract infection without hematuria   . History of colonic polyps 05/10/2016  . History of stroke 03/03/2015  . GERD (gastroesophageal reflux disease) 02/06/2015  . Aortic sclerosis 12/02/2014  . Osteopenia 01/14/2014  . Prediabetes 08/05/2013  . Hypercalcemia 04/22/2013  . Renal insufficiency 02/15/2013  . Mild cognitive impairment 01/03/2013  . Memory loss 01/03/2013  . Ulcer 10/12/2012  . Hypertension 10/12/2012  . Hyperlipidemia 10/12/2012  . Cerebral infarction ([place]) 10/12/2012  . HIP PAIN, RIGHT 12/29/2009   Past Medical History:  Diagnosis Date  . Anxiety   . Chronic back pain   . Hyperlipidemia   . Hypertension   . Memory loss   . Mild cognitive impairment, so stated   . Occlusion and stenosis of carotid artery with cerebral infarction    Bilateral less than 50% carotid stenosis.  . Persistent atrial fibrillation ([place]) 06/23/2016  . Renal cyst   . Sciatica   . Stroke ([place])    2010, RMCA with stent  . Toe fracture 11/2015   R toe  . Ulcer 1970s    Family History  Problem Relation Age of Onset  . Stroke Mother   . Heart failure Father   . Diabetes Brother     Past Surgical History:  Procedure Laterality Date  . BIOPSY  04/25/2018   Procedure: BIOPSY;  Surgeon: Rogene Houston, MD;  [facility]: [facility];  Service: Endoscopy;;  gastric polyps  . COLONOSCOPY  03/10/2011   Procedure: COLONOSCOPY;  Surgeon: Rogene Houston, MD;   Location: [facility];  Service: Endoscopy;  Laterality: N/A;  . COLONOSCOPY N/A 08/24/2016   Procedure: COLONOSCOPY;  Surgeon: Rogene Houston, MD;  Location: [facility];  Service: Endoscopy;  Laterality: N/A;  930  . DILATION AND CURETTAGE OF UTERUS     years ago  . ESOPHAGOGASTRODUODENOSCOPY N/A 04/25/2018   Procedure: ESOPHAGOGASTRODUODENOSCOPY (EGD);  Surgeon: Rogene Houston, MD;  Location: [facility];  Service: Endoscopy;  Laterality: N/A;  1:15  . OVARIAN CYST REMOVAL     at least 10 years ago  . POLYPECTOMY  08/24/2016   Procedure: POLYPECTOMY;  Surgeon: Rogene Houston, MD;  Location: [facility];  Service: Endoscopy;;  ascending colon; hepatic flexure  . TONSILLECTOMY     childhood   Social History   Occupational History    Employer: RETIRED  Tobacco Use  . Smoking status: Never Smoker  . Smokeless tobacco: Never Used  Vaping Use  . Vaping Use: Never used  Substance and Sexual Activity  . Alcohol use: No    Alcohol/week: 0.0 standard drinks  . Drug use: No  . Sexual activity: Not on file

## 2020-11-10 ENCOUNTER — Ambulatory Visit (INDEPENDENT_AMBULATORY_CARE_PROVIDER_SITE_OTHER): Payer: Medicare Other | Admitting: Orthopedic Surgery

## 2020-11-10 DIAGNOSIS — Z23 Encounter for immunization: Secondary | ICD-10-CM | POA: Diagnosis not present

## 2020-11-10 DIAGNOSIS — M25571 Pain in right ankle and joints of right foot: Secondary | ICD-10-CM | POA: Diagnosis not present

## 2020-11-10 MED ORDER — COLCHICINE 0.6 MG PO CAPS: 0.6000 mg | ORAL_CAPSULE | Freq: Every day | ORAL | 1 refills | Status: DC | PRN

## 2020-11-10 MED ORDER — ALLOPURINOL 100 MG PO TABS
100.0000 mg | ORAL_TABLET | Freq: Two times a day (BID) | ORAL | 3 refills | Status: DC
Start: 2020-11-10 — End: 2021-06-21

## 2020-11-10 NOTE — Progress Notes (Signed)
Office Visit Note   Patient: Becky Baird           Date of Birth: 08-May-1934           MRN: [id number] Visit Date: 11/10/2020              Requested by: Kathyrn Drown, MD [place],  [place] [address] PCP: Kathyrn Drown, MD  Chief Complaint  Patient presents with  . Right Ankle - Follow-up      HPI: Patient is a 87 year old woman who has been wearing knee-high compression stockings she states she still has swelling around the ankle she states that her swelling has decreased with the compression stockings.  Her uric acid was 6.9.  Assessment & Plan: Visit Diagnoses:  1. Pain in right ankle and joints of right foot     Plan: Patient will start with colchicine 0.6 mg 1 a day and then will advance her to allopurinol.  She will continue with the compression stockings.  Follow-Up Instructions: Return in about 2 weeks (around 11/24/2020).   Ortho Exam  Patient is alert, oriented, no adenopathy, well-dressed, normal affect, normal respiratory effort. Examination patient has a strong dorsalis pedis pulse she has pain to palpation across the ankle there is decreased swelling there is venous stasis insufficiency skin color changes.  Imaging: No results found. No images are attached to the encounter.  Labs: Lab Results  Component Value Date   HGBA1C 5.7 (H) 11/10/2019   HGBA1C 5.6 11/23/2017   HGBA1C 5.8 (H) 10/31/2016   ESRSEDRATE 9 04/19/2017   ESRSEDRATE 4 10/31/2016   CRP 0.8 04/19/2017   LABURIC 6.9 11/05/2020   REPTSTATUS 06/26/2020 FINAL 06/24/2020   CULT (A) 06/24/2020    <10,000 COLONIES/mL INSIGNIFICANT GROWTH Performed at [facility], [place] [address] [place], [place] [address]    LABORGA ESCHERICHIA COLI (A) 11/10/2019     Lab Results  Component Value Date   ALBUMIN 4.2 11/10/2019   ALBUMIN 3.3 (L) 04/07/2018   ALBUMIN 3.5 11/28/2017    Lab Results  Component Value Date   MG 1.8 04/07/2018   MG 2.2 06/03/2009   Lab  Results  Component Value Date   VD25OH 50 05/29/2014    No results found for: PREALBUMIN CBC EXTENDED Latest Ref Rng & Units 04/24/2020 11/10/2019 12/13/2018  WBC 3.4 - 10.8 x10E3/uL 6.0 6.5 -  RBC 3.77 - 5.28 x10E6/uL 4.09 3.73(L) -  HGB 11.1 - 15.9 g/dL 12.8 11.7(L) 12.5  HCT 34.0 - 46.6 % 38.2 35.4(L) -  PLT 150 - 450 x10E3/uL 253 200 -  NEUTROABS 1.4 - 7.0 x10E3/uL 3.6 3.6 -  LYMPHSABS 0.7 - 3.1 x10E3/uL 1.6 2.0 -     There is no height or weight on file to calculate BMI.  Orders:  No orders of the defined types were placed in this encounter.  Meds ordered this encounter  Medications  . Colchicine 0.6 MG CAPS    Sig: Take 0.6 mg by mouth daily as needed.    Dispense:  60 capsule    Refill:  1  . allopurinol (ZYLOPRIM) 100 MG tablet    Sig: Take 1 tablet (100 mg total) by mouth 2 (two) times daily.    Dispense:  60 tablet    Refill:  3     Procedures: No procedures performed  Clinical Data: No additional findings.  ROS:  All other systems negative, except as noted in the HPI. Review of Systems  Objective: Vital Signs: There were no vitals taken for this visit.  Specialty Comments:  No specialty comments available.  PMFS History: Patient Active Problem List   Diagnosis Date Noted  . Right calf pain 10/27/2020  . Right leg swelling 10/27/2020  . Myalgia due to statin 10/08/2020  . Aortic atherosclerosis ([place]) 04/28/2020  . Stenosis of carotid artery 12/03/2019  . Bradycardia 12/04/2018  . Nonrheumatic aortic valve stenosis 12/04/2018  . Acute metabolic encephalopathy 57/84/6962  . Pyelonephritis 04/07/2018  . Acute right-sided low back pain with right-sided sciatica 11/24/2017  . Unsteady gait 11/24/2017  . Primary osteoarthritis of both hands 11/13/2017  . Paroxysmal atrial fibrillation ([place]) 11/23/2016  . Murmur 11/23/2016  . TIA (transient ischemic attack) 05/12/2016  . Urinary tract infection without hematuria   . History of colonic polyps  05/10/2016  . History of stroke 03/03/2015  . GERD (gastroesophageal reflux disease) 02/06/2015  . Aortic sclerosis 12/02/2014  . Osteopenia 01/14/2014  . Prediabetes 08/05/2013  . Hypercalcemia 04/22/2013  . Renal insufficiency 02/15/2013  . Mild cognitive impairment 01/03/2013  . Memory loss 01/03/2013  . Ulcer 10/12/2012  . Hypertension 10/12/2012  . Hyperlipidemia 10/12/2012  . Cerebral infarction ([place]) 10/12/2012  . HIP PAIN, RIGHT 12/29/2009   Past Medical History:  Diagnosis Date  . Anxiety   . Chronic back pain   . Hyperlipidemia   . Hypertension   . Memory loss   . Mild cognitive impairment, so stated   . Occlusion and stenosis of carotid artery with cerebral infarction    Bilateral less than 50% carotid stenosis.  . Persistent atrial fibrillation ([place]) 06/23/2016  . Renal cyst   . Sciatica   . Stroke ([place])    2010, RMCA with stent  . Toe fracture 11/2015   R toe  . Ulcer 1970s    Family History  Problem Relation Age of Onset  . Stroke Mother   . Heart failure Father   . Diabetes Brother     Past Surgical History:  Procedure Laterality Date  . BIOPSY  04/25/2018   Procedure: BIOPSY;  Surgeon: Rogene Houston, MD;  [facility];  Service: Endoscopy;;  gastric polyps  . COLONOSCOPY  03/10/2011   Procedure: COLONOSCOPY;  Surgeon: Rogene Houston, MD;  [facility];  Service: Endoscopy;  Laterality: N/A;  . COLONOSCOPY N/A 08/24/2016   Procedure: COLONOSCOPY;  Surgeon: Rogene Houston, MD;  [facility];  Service: Endoscopy;  Laterality: N/A;  930  . DILATION AND CURETTAGE OF UTERUS     years ago  . ESOPHAGOGASTRODUODENOSCOPY N/A 04/25/2018   Procedure: ESOPHAGOGASTRODUODENOSCOPY (EGD);  Surgeon: Rogene Houston, MD;  [facility];  Service: Endoscopy;  Laterality: N/A;  1:15  . OVARIAN CYST REMOVAL     at least 10 years ago  . POLYPECTOMY  08/24/2016   Procedure: POLYPECTOMY;  Surgeon: Rogene Houston, MD;   [facility];  Service: Endoscopy;;  ascending colon; hepatic flexure  . TONSILLECTOMY     childhood   Social History   Occupational History    Employer: RETIRED  Tobacco Use  . Smoking status: Never Smoker  . Smokeless tobacco: Never Used  Vaping Use  . Vaping Use: Never used  Substance and Sexual Activity  . Alcohol use: No    Alcohol/week: 0.0 standard drinks  . Drug use: No  . Sexual activity: Not on file

## 2020-11-12 ENCOUNTER — Encounter: Payer: Self-pay | Admitting: Orthopedic Surgery

## 2020-11-12 NOTE — Telephone Encounter (Signed)
Call and spoke with pt about [name] recommendation, pt stated she had an appt with an orthopedic Dr Sharol Given, she stated she had seen him twice(note in [place]) and he had rule out any DVT and gout, I advised pt she have an appt with [name] on 05/06 and to keep appointment with him, pt voice understanding and thanks for the call back

## 2020-11-17 ENCOUNTER — Telehealth: Payer: Self-pay

## 2020-11-17 NOTE — Telephone Encounter (Signed)
Becky Baird   4/19 got her 4th Covid shot, Modema  Patient wanted it documented in her chart

## 2020-11-17 NOTE — Telephone Encounter (Signed)
Information abstracted into Epic as requested.

## 2020-11-24 ENCOUNTER — Ambulatory Visit (INDEPENDENT_AMBULATORY_CARE_PROVIDER_SITE_OTHER): Payer: Medicare Other

## 2020-11-24 ENCOUNTER — Ambulatory Visit (INDEPENDENT_AMBULATORY_CARE_PROVIDER_SITE_OTHER): Payer: Medicare Other | Admitting: Orthopedic Surgery

## 2020-11-24 DIAGNOSIS — M533 Sacrococcygeal disorders, not elsewhere classified: Secondary | ICD-10-CM

## 2020-11-24 DIAGNOSIS — M25571 Pain in right ankle and joints of right foot: Secondary | ICD-10-CM

## 2020-11-24 DIAGNOSIS — I878 Other specified disorders of veins: Secondary | ICD-10-CM | POA: Diagnosis not present

## 2020-11-25 ENCOUNTER — Encounter: Payer: Self-pay | Admitting: Orthopedic Surgery

## 2020-11-25 NOTE — Progress Notes (Signed)
Office Visit Note   Patient: Becky Baird           Date of Birth: 1934-02-15           MRN: [id number] Visit Date: 11/24/2020              Requested by: Kathyrn Drown, MD [place],  [place] [address] PCP: Kathyrn Drown, MD  Chief Complaint  Patient presents with  . Right Ankle - Follow-up  . Right Foot - Pain  . Lower Back - Pain    Coccyx pain s/p fall       HPI: Patient is seen in follow-up with complaints of multiple joint pain.  Her uric acid on her last exam was 6.9 and she was started on gout medication.  Patient states that she has not noticed any improvement from the medication.  Patient complains of lateral right ankle pain she states that she fell a few weeks ago with swelling and pain in the lateral aspect of the right ankle and pain over the coccyx.  Patient also complains of pain from ingrown toenails.  Patient also complains of leg spasms.She states she is on a fluid pill.  Assessment & Plan: Visit Diagnoses:  1. Pain in right ankle and joints of right foot   2. Pain, coccyx   3. Venous stasis of both lower extremities     Plan: Recommended compression stockings for her leg swelling.  The ingrown nails were trimmed there was no signs of infection.  Recommended electrolyte replacement such as coconut water for the muscle spasms possibly due to her fluid pill.  Treat the coccyx pain symptomatically with a donut  Follow-Up Instructions: Return in about 4 weeks (around 12/22/2020).   Ortho Exam  Patient is alert, oriented, no adenopathy, well-dressed, normal affect, normal respiratory effort. Examination patient is tender to palpation along the coccyx.  Radiograph shows no fractures.  She has no pain with range of motion of her hips.  Her ankle has swelling laterally with pain to palpation over the anterior talofibular ligament anterior drawer is stable.  Examination now she does have multiple ingrown toenails there is no paronychial  infection.  The ingrown nails were trimmed and debrided.  Imaging: No results found. No images are attached to the encounter.  Labs: Lab Results  Component Value Date   HGBA1C 5.7 (H) 11/10/2019   HGBA1C 5.6 11/23/2017   HGBA1C 5.8 (H) 10/31/2016   ESRSEDRATE 9 04/19/2017   ESRSEDRATE 4 10/31/2016   CRP 0.8 04/19/2017   LABURIC 6.9 11/05/2020   REPTSTATUS 06/26/2020 FINAL 06/24/2020   CULT (A) 06/24/2020    <10,000 COLONIES/mL INSIGNIFICANT GROWTH Performed at [facility], [place] [address] [place], [place] [address]    LABORGA ESCHERICHIA COLI (A) 11/10/2019     Lab Results  Component Value Date   ALBUMIN 4.2 11/10/2019   ALBUMIN 3.3 (L) 04/07/2018   ALBUMIN 3.5 11/28/2017    Lab Results  Component Value Date   MG 1.8 04/07/2018   MG 2.2 06/03/2009   Lab Results  Component Value Date   VD25OH 50 05/29/2014    No results found for: PREALBUMIN CBC EXTENDED Latest Ref Rng & Units 04/24/2020 11/10/2019 12/13/2018  WBC 3.4 - 10.8 x10E3/uL 6.0 6.5 -  RBC 3.77 - 5.28 x10E6/uL 4.09 3.73(L) -  HGB 11.1 - 15.9 g/dL 12.8 11.7(L) 12.5  HCT 34.0 - 46.6 % 38.2 35.4(L) -  PLT 150 - 450 x10E3/uL  253 200 -  NEUTROABS 1.4 - 7.0 x10E3/uL 3.6 3.6 -  LYMPHSABS 0.7 - 3.1 x10E3/uL 1.6 2.0 -     There is no height or weight on file to calculate BMI.  Orders:  Orders Placed This Encounter  Procedures  . XR Foot 2 Views Right  . XR Ankle 2 Views Right  . XR Sacrum/Coccyx   No orders of the defined types were placed in this encounter.    Procedures: No procedures performed  Clinical Data: No additional findings.  ROS:  All other systems negative, except as noted in the HPI. Review of Systems  Objective: Vital Signs: There were no vitals taken for this visit.  Specialty Comments:  No specialty comments available.  PMFS History: Patient Active Problem List   Diagnosis Date Noted  . Right calf pain 10/27/2020  . Right leg swelling 10/27/2020  . Myalgia due  to statin 10/08/2020  . Aortic atherosclerosis ([place]) 04/28/2020  . Stenosis of carotid artery 12/03/2019  . Bradycardia 12/04/2018  . Nonrheumatic aortic valve stenosis 12/04/2018  . Acute metabolic encephalopathy 37/16/9678  . Pyelonephritis 04/07/2018  . Acute right-sided low back pain with right-sided sciatica 11/24/2017  . Unsteady gait 11/24/2017  . Primary osteoarthritis of both hands 11/13/2017  . Paroxysmal atrial fibrillation ([place]) 11/23/2016  . Murmur 11/23/2016  . TIA (transient ischemic attack) 05/12/2016  . Urinary tract infection without hematuria   . History of colonic polyps 05/10/2016  . History of stroke 03/03/2015  . GERD (gastroesophageal reflux disease) 02/06/2015  . Aortic sclerosis 12/02/2014  . Osteopenia 01/14/2014  . Prediabetes 08/05/2013  . Hypercalcemia 04/22/2013  . Renal insufficiency 02/15/2013  . Mild cognitive impairment 01/03/2013  . Memory loss 01/03/2013  . Ulcer 10/12/2012  . Hypertension 10/12/2012  . Hyperlipidemia 10/12/2012  . Cerebral infarction ([place]) 10/12/2012  . HIP PAIN, RIGHT 12/29/2009   Past Medical History:  Diagnosis Date  . Anxiety   . Chronic back pain   . Hyperlipidemia   . Hypertension   . Memory loss   . Mild cognitive impairment, so stated   . Occlusion and stenosis of carotid artery with cerebral infarction    Bilateral less than 50% carotid stenosis.  . Persistent atrial fibrillation ([place]) 06/23/2016  . Renal cyst   . Sciatica   . Stroke ([place])    2010, RMCA with stent  . Toe fracture 11/2015   R toe  . Ulcer 1970s    Family History  Problem Relation Age of Onset  . Stroke Mother   . Heart failure Father   . Diabetes Brother     Past Surgical History:  Procedure Laterality Date  . BIOPSY  04/25/2018   Procedure: BIOPSY;  Surgeon: Rogene Houston, MD;  [facility];  Service: Endoscopy;;  gastric polyps  . COLONOSCOPY  03/10/2011   Procedure: COLONOSCOPY;  Surgeon: Rogene Houston, MD;   [facility];  Service: Endoscopy;  Laterality: N/A;  . COLONOSCOPY N/A 08/24/2016   Procedure: COLONOSCOPY;  Surgeon: Rogene Houston, MD;  [facility];  Service: Endoscopy;  Laterality: N/A;  930  . DILATION AND CURETTAGE OF UTERUS     years ago  . ESOPHAGOGASTRODUODENOSCOPY N/A 04/25/2018   Procedure: ESOPHAGOGASTRODUODENOSCOPY (EGD);  Surgeon: Rogene Houston, MD;  [facility];  Service: Endoscopy;  Laterality: N/A;  1:15  . OVARIAN CYST REMOVAL     at least 10 years ago  . POLYPECTOMY  08/24/2016   Procedure: POLYPECTOMY;  Surgeon: Rogene Houston, MD;  [facility];  Service: Endoscopy;;  ascending colon; hepatic flexure  . TONSILLECTOMY     childhood   Social History   Occupational History    Employer: RETIRED  Tobacco Use  . Smoking status: Never Smoker  . Smokeless tobacco: Never Used  Vaping Use  . Vaping Use: Never used  Substance and Sexual Activity  . Alcohol use: No    Alcohol/week: 0.0 standard drinks  . Drug use: No  . Sexual activity: Not on file

## 2020-11-26 NOTE — Progress Notes (Signed)
Cardiology Office Note   Date:  11/27/2020   ID:  Becky Baird, DOB 1933/10/17, MRN [id number]  PCP:  Kathyrn Drown, MD  Cardiologist:   Minus Breeding, MD   Chief Complaint  Patient presents with  . Edema      History of Present Illness: Becky Baird is a 87 y.o. female who presents for follow up of PAF and previous stroke.  Since I last saw her she has had increased lower extremity swelling.   She has had no evidence of lower extremity DVT.  She was referred to ortho.   She was treated for gout without improvement.  I reviewed the lower extremity ultrasounds and on both studies there was no suggestion of DVT as far up as they could see in him vessels they could visualize proximally.  She has consistently been on her Xarelto.  Swelling is finally starting to get better.  She related some trauma but her daughters tell me that the swelling started before the trauma.  This was in the right greater than left leg.  She is not having any new shortness of breath, PND or orthopnea.  She gets fatigued doing such things as taking a shower but this is baseline.  She is not having any palpitations, presyncope or syncope.  She had no chest pressure, neck or arm discomfort.   Past Medical History:  Diagnosis Date  . Anxiety   . Chronic back pain   . Hyperlipidemia   . Hypertension   . Memory loss   . Mild cognitive impairment, so stated   . Occlusion and stenosis of carotid artery with cerebral infarction    Bilateral less than 50% carotid stenosis.  . Persistent atrial fibrillation ([place]) 06/23/2016  . Renal cyst   . Sciatica   . Stroke ([place])    2010, RMCA with stent  . Toe fracture 11/2015   R toe  . Ulcer 1970s    Past Surgical History:  Procedure Laterality Date  . BIOPSY  04/25/2018   Procedure: BIOPSY;  Surgeon: Rogene Houston, MD;  [facility];  Service: Endoscopy;;  gastric polyps  . COLONOSCOPY  03/10/2011   Procedure: COLONOSCOPY;  Surgeon: Rogene Houston, MD;  [facility];  Service: Endoscopy;  Laterality: N/A;  . COLONOSCOPY N/A 08/24/2016   Procedure: COLONOSCOPY;  Surgeon: Rogene Houston, MD;  [facility];  Service: Endoscopy;  Laterality: N/A;  930  . DILATION AND CURETTAGE OF UTERUS     years ago  . ESOPHAGOGASTRODUODENOSCOPY N/A 04/25/2018   Procedure: ESOPHAGOGASTRODUODENOSCOPY (EGD);  Surgeon: Rogene Houston, MD;  [facility];  Service: Endoscopy;  Laterality: N/A;  1:15  . OVARIAN CYST REMOVAL     at least 10 years ago  . POLYPECTOMY  08/24/2016   Procedure: POLYPECTOMY;  Surgeon: Rogene Houston, MD;  [facility];  Service: Endoscopy;;  ascending colon; hepatic flexure  . TONSILLECTOMY     childhood     Current Outpatient Medications  Medication Sig Dispense Refill  . allopurinol (ZYLOPRIM) 100 MG tablet Take 1 tablet (100 mg total) by mouth 2 (two) times daily. 60 tablet 3  . ALPRAZolam (XANAX) 0.25 MG tablet TAKE ONE TABLET BY MOUTH AT LUNCHTIME AS NEEDED, ONE TABLET IN THE AFTERNOON AS NEEDED, AND ONE TABLET AT BEDTIME AS NEEDED 90 tablet 3  . clobetasol ointment (TEMOVATE) 6.31 % Apply 1 application topically 2 (two) times daily as needed.    Marland Kitchen  Colchicine 0.6 MG CAPS Take 0.6 mg by mouth daily as needed. 60 capsule 1  . estradiol (ESTRACE) 0.1 MG/GM vaginal cream Place 1 Applicatorful vaginally once a week.    . feeding supplement (BOOST HIGH PROTEIN) LIQD Take 1 Container by mouth 3 (three) times daily between meals.    . feeding supplement, GLUCERNA SHAKE, (GLUCERNA SHAKE) LIQD Take 237 mLs by mouth 3 (three) times daily between meals.     . fish oil-omega-3 fatty acids 1000 MG capsule Take 2 g by mouth 2 (two) times a day.     . hydrochlorothiazide (HYDRODIURIL) 12.5 MG tablet TAKE ONE TABLET BY MOUTH DAILY 30 tablet 5  . Ketotifen Fumarate (ALAWAY OP) Apply 1 drop to eye daily as needed.    . loratadine (CLARITIN) 10 MG tablet Take 1 tablet (10 mg total) by mouth  daily. (Patient taking differently: Take 10 mg by mouth at bedtime.) 30 tablet 5  . OVER THE COUNTER MEDICATION Take 1 capsule by mouth every morning. [place]    . OVER THE COUNTER MEDICATION Take by mouth daily. Vitamin d3    . pantoprazole (PROTONIX) 40 MG tablet TAKE ONE TABLET BY MOUTH DAILY 30 tablet 5  . potassium chloride (KLOR-CON) 10 MEQ tablet TAKE ONE TABLET BY MOUTH TWICE DAILY 60 tablet 5  . Red Yeast Rice 600 MG CAPS Take 1,200 mg by mouth 2 (two) times a day.     . Rivaroxaban (XARELTO) 15 MG TABS tablet TAKE ONE TABLET BY MOUTH DAILY WITH SUPPER. 21 tablet 0  . sodium chloride (OCEAN) 0.65 % nasal spray Place 1 spray into the nose as needed for congestion.      No current facility-administered medications for this visit.    Allergies:   Amlodipine, Niaspan [niacin er], and Oxybutynin    ROS:  Please see the history of present illness.   Otherwise, review of systems are positive for none.   All other systems are reviewed and negative.    PHYSICAL EXAM: VS:  BP (!) 154/68   Pulse 63   Ht 5\' 7"  (1.702 m)   Wt 142 lb 9.6 oz (64.7 kg)   BMI 22.33 kg/m  , BMI Body mass index is 22.33 kg/m. GEN:  No distress NECK:  No jugular venous distention at 90 degrees, waveform within normal limits, carotid upstroke brisk and symmetric, no bruits, no thyromegaly LYMPHATICS:  No cervical adenopathy LUNGS:  Clear to auscultation bilaterally BACK:  No CVA tenderness CHEST:  Unremarkable HEART:  S1 and S2 within normal limits, no S3, no S4, no clicks, no rubs, 3 out of 6 apical systolic murmur early peaking, no diastolic murmurs ABD:  Positive bowel sounds normal in frequency in pitch, no bruits, no rebound, no guarding, unable to assess midline mass or bruit with the patient seated. EXT:  2 plus pulses throughout, moderate edema, no cyanosis no clubbing SKIN:  No rashes no nodules NEURO:  Cranial nerves II through XII grossly intact, motor grossly intact throughout PSYCH:   Cognitively intact, oriented to person place and time   EKG:  EKG is   ordered today. The ekg ordered for 11/27/2020 demonstrates sinus rhythm, rate 63, axis within normal limits, intervals within normal limits, poor anterior R wave progression, no acute ST-T wave changes.   Recent Labs: 12/16/2019: TSH 3.550 04/24/2020: Hemoglobin 12.8; Platelets 253 10/08/2020: BUN 23; Creatinine, Ser 1.38; Potassium 4.1; Sodium 138    Lipid Panel    Component Value Date/Time   CHOL 202 (  H) 04/24/2020 0934   TRIG 56 04/24/2020 0934   HDL 71 04/24/2020 0934   CHOLHDL 2.8 04/24/2020 0934   CHOLHDL 2.8 11/10/2019 0000   VLDL 13 11/10/2019 0000   LDLCALC 121 (H) 04/24/2020 0934      Wt Readings from Last 3 Encounters:  11/27/20 142 lb 9.6 oz (64.7 kg)  10/27/20 142 lb (64.4 kg)  10/08/20 146 lb 9.6 oz (66.5 kg)      Other studies Reviewed: Additional studies/ records that were reviewed today include: Doppler.    Review of the above records demonstrates:  Please see elsewhere in the note.     ASSESSMENT AND PLAN:   LEG SWELLING:    I reviewed the Dopplers extensively.  The patient is improving.  She has been on anticoagulation.  I do not think there is any indication for CT angiography or venography.  They will let me know if this worsens and we might need further imaging.  PAF:Ms.Rajah Lamba Wrighthas a CHA2DS2 - VASc score of 6. She had no paroxysms of this.  No change in therapy.  HTN:The blood pressure is  elevated but she thinks is unusual and she is going to keep a blood pressure diary.    AORTIC STENOSIS:This was mild previously and the gradient did not change significantly from 2019-20 20.  I will repeat an echocardiogram likely next year.   CAROTID STENOSIS:  This was mild previously no change in therapy.  No further testing.   FATIGUE:   This has been present since previous hospitalization.   She had normal thyroid studies last year to evaluate this.  No further  testing is indicated.   Current medicines are reviewed at length with the patient today.  The patient does not have concerns regarding medicines.  The following changes have been made: None  Labs/ tests ordered today include: None  Orders Placed This Encounter  Procedures  . EKG 12-Lead     Disposition:   FU with me in 1 year   Signed, Minus Breeding, MD  11/27/2020 2:14 PM    [place]

## 2020-11-27 ENCOUNTER — Ambulatory Visit (INDEPENDENT_AMBULATORY_CARE_PROVIDER_SITE_OTHER): Payer: Medicare Other | Admitting: Cardiology

## 2020-11-27 ENCOUNTER — Encounter: Payer: Self-pay | Admitting: Cardiology

## 2020-11-27 ENCOUNTER — Other Ambulatory Visit: Payer: Self-pay

## 2020-11-27 VITALS — BP 154/68 | HR 63 | Ht 67.0 in | Wt 142.6 lb

## 2020-11-27 DIAGNOSIS — I480 Paroxysmal atrial fibrillation: Secondary | ICD-10-CM | POA: Diagnosis not present

## 2020-11-27 DIAGNOSIS — I10 Essential (primary) hypertension: Secondary | ICD-10-CM

## 2020-11-27 NOTE — Patient Instructions (Signed)
Medication Instructions:  Continue same medications *If you need a refill on your cardiac medications before your next appointment, please call your pharmacy*   Lab Work: None ordered   Testing/Procedures: None ordered   Follow-Up: At [facility], you and your health needs are our priority.  As part of our continuing mission to provide you with exceptional heart care, we have created designated Provider Care Teams.  These Care Teams include your primary Cardiologist (physician) and Advanced Practice Providers (APPs -  Physician Assistants and Nurse Practitioners) who all work together to provide you with the care you need, when you need it.  We recommend signing up for the patient portal called "MyChart".  Sign up information is provided on this After Visit Summary.  MyChart is used to connect with patients for Virtual Visits (Telemedicine).  Patients are able to view lab/test results, encounter notes, upcoming appointments, etc.  Non-urgent messages can be sent to your provider as well.   To learn more about what you can do with MyChart, go to [email or web address].      Your next appointment:  1 year    The format for your next appointment: Office   Provider:  Dr.Hochrein   

## 2020-12-02 DIAGNOSIS — M79675 Pain in left toe(s): Secondary | ICD-10-CM | POA: Diagnosis not present

## 2020-12-02 DIAGNOSIS — M79672 Pain in left foot: Secondary | ICD-10-CM | POA: Diagnosis not present

## 2020-12-02 DIAGNOSIS — L110 Acquired keratosis follicularis: Secondary | ICD-10-CM | POA: Diagnosis not present

## 2020-12-02 DIAGNOSIS — M79671 Pain in right foot: Secondary | ICD-10-CM | POA: Diagnosis not present

## 2020-12-02 DIAGNOSIS — M79674 Pain in right toe(s): Secondary | ICD-10-CM | POA: Diagnosis not present

## 2020-12-02 DIAGNOSIS — I739 Peripheral vascular disease, unspecified: Secondary | ICD-10-CM | POA: Diagnosis not present

## 2020-12-03 ENCOUNTER — Telehealth: Payer: Self-pay | Admitting: Family Medicine

## 2020-12-03 NOTE — Telephone Encounter (Signed)
Please see dr scott's message below and schedule pt an appt. thanks

## 2020-12-03 NOTE — Telephone Encounter (Signed)
Scheduled appointment on 5/18 at 3:30

## 2020-12-03 NOTE — Telephone Encounter (Signed)
1.  I did speak with Dr. Blanch Media #2 you can inform family of this #3 I did tell him that I would be happy to see Ms. Sensabaugh but I would not be able to see her this week because of the schedule being so busy #4 there was a previous phone message I sent regarding her please see it Also recommend office visit next week or the week after can be at the end of the morning or end of the afternoon Needless to say schedule will be quite steady which makes it difficult to get people in as quickly as they may want to

## 2020-12-03 NOTE — Telephone Encounter (Signed)
Daughter -[name] is calling to schedule follow up visit for legs swelling. She was seen at foot doctor and he suggested she had follow up with primary care for swelling in legs. Please advise where to schedule patient.

## 2020-12-03 NOTE — Telephone Encounter (Signed)
Patient with burning in the legs keeps her up at night podiatrist called concerned that its idiopathic neuropathy recommended Lyrica or Neurontin Asked that we see the patient  Nurses-please set the patient up for a follow-up either in next week or the week after with me either at the opening late morning or late afternoon thank you

## 2020-12-09 ENCOUNTER — Ambulatory Visit (INDEPENDENT_AMBULATORY_CARE_PROVIDER_SITE_OTHER): Payer: Medicare Other | Admitting: Family Medicine

## 2020-12-09 ENCOUNTER — Other Ambulatory Visit: Payer: Self-pay

## 2020-12-09 ENCOUNTER — Ambulatory Visit: Payer: Medicare Other | Admitting: Family Medicine

## 2020-12-09 VITALS — BP 126/62 | HR 77 | Temp 97.8°F | Ht 67.0 in | Wt 143.0 lb

## 2020-12-09 DIAGNOSIS — N289 Disorder of kidney and ureter, unspecified: Secondary | ICD-10-CM | POA: Diagnosis not present

## 2020-12-09 DIAGNOSIS — G609 Hereditary and idiopathic neuropathy, unspecified: Secondary | ICD-10-CM | POA: Diagnosis not present

## 2020-12-09 MED ORDER — GABAPENTIN 100 MG PO CAPS: ORAL_CAPSULE | ORAL | 1 refills | Status: DC

## 2020-12-09 NOTE — Progress Notes (Signed)
   Subjective:    Patient ID: Becky Baird, female    DOB: [birth year], 87 y.o.   MRN: [id number]  HPI Discuss medication for pain toes hypertension She relates a lot of burning pain and discomfort in her toes Relates a little bit of swelling in the lower legs Denies any other types of setbacks  Review of Systems     Objective:   Physical Exam  Lungs clear heart regular extremities minimal edema      Assessment & Plan:  Patient with fatigue tiredness Neuropathy in the feet She would like to try Neurontin We will go ahead and send this in If she has ongoing troubles she will let us know She will give [place] feedback within a few weeks time how that is going If tolerating medicine well but needs higher dose we can go to 2 tablets Patient to do follow-up within 4 to 6 weeks To follow-up renal function

## 2020-12-10 LAB — BASIC METABOLIC PANEL (7)
BUN/Creatinine Ratio: 24 (ref 12–28)
BUN: 31 mg/dL — ABNORMAL HIGH (ref 8–27)
CO2: 22 mmol/L (ref 20–29)
Chloride: 102 mmol/L (ref 96–106)
Creatinine, Ser: 1.3 mg/dL — ABNORMAL HIGH (ref 0.57–1.00)
Glucose: 115 mg/dL — ABNORMAL HIGH (ref 65–99)
Potassium: 4.6 mmol/L (ref 3.5–5.2)
Sodium: 142 mmol/L (ref 134–144)
eGFR: 41 mL/min/{1.73_m2} — ABNORMAL LOW (ref >59)

## 2020-12-10 LAB — VITAMIN B12: Vitamin B-12: 446 pg/mL (ref 232–1245)

## 2020-12-30 ENCOUNTER — Other Ambulatory Visit: Payer: Self-pay | Admitting: Cardiology

## 2020-12-30 ENCOUNTER — Ambulatory Visit: Payer: Medicare Other | Admitting: Cardiology

## 2020-12-30 ENCOUNTER — Other Ambulatory Visit: Payer: Self-pay | Admitting: Family Medicine

## 2020-12-30 NOTE — Telephone Encounter (Signed)
89f, 64.9kg, scr 1.3 12/09/20, lovw/hochrein 11/27/20, ccr 32.4

## 2021-01-05 ENCOUNTER — Ambulatory Visit: Payer: Medicare Other | Admitting: Orthopedic Surgery

## 2021-01-14 ENCOUNTER — Other Ambulatory Visit: Payer: Self-pay

## 2021-01-14 ENCOUNTER — Ambulatory Visit (INDEPENDENT_AMBULATORY_CARE_PROVIDER_SITE_OTHER): Payer: Medicare Other | Admitting: Family Medicine

## 2021-01-14 VITALS — BP 148/62 | HR 67 | Ht 67.0 in | Wt 146.0 lb

## 2021-01-14 DIAGNOSIS — M791 Myalgia, unspecified site: Secondary | ICD-10-CM

## 2021-01-14 DIAGNOSIS — N289 Disorder of kidney and ureter, unspecified: Secondary | ICD-10-CM

## 2021-01-14 DIAGNOSIS — N1832 Chronic kidney disease, stage 3b: Secondary | ICD-10-CM

## 2021-01-14 DIAGNOSIS — E7849 Other hyperlipidemia: Secondary | ICD-10-CM

## 2021-01-14 DIAGNOSIS — T466X5A Adverse effect of antihyperlipidemic and antiarteriosclerotic drugs, initial encounter: Secondary | ICD-10-CM

## 2021-01-14 DIAGNOSIS — I10 Essential (primary) hypertension: Secondary | ICD-10-CM

## 2021-01-14 DIAGNOSIS — R2681 Unsteadiness on feet: Secondary | ICD-10-CM | POA: Diagnosis not present

## 2021-01-14 DIAGNOSIS — I129 Hypertensive chronic kidney disease with stage 1 through stage 4 chronic kidney disease, or unspecified chronic kidney disease: Secondary | ICD-10-CM

## 2021-01-14 DIAGNOSIS — I480 Paroxysmal atrial fibrillation: Secondary | ICD-10-CM

## 2021-01-14 NOTE — Progress Notes (Addendum)
   Subjective:    Patient ID: Becky Baird, female    DOB: 25-Jan-1934, 87 y.o.   MRN: [id number]  HPI  Patient arrives for a follow up on neuropathy and renal function. Long discussion held today regarding how she has been doing She feels that gabapentin was causing her to have mental fog She does not want to take it any longer She does have neuropathy in her feet She also has renal function issues Also has chronic swelling issues She also has difficulty with walking associated with her orthopedic issues and getting older Discuss Gabapentin  Review of Systems     Objective:   Physical Exam  Heart irregular rate controlled lungs are clear no crackles extremities some trace edema patient was observed walking she can walk but she goes very slow but it is mainly because of her age and orthopedic issues ways to help prevent falls were discussed      Assessment & Plan:   1. Primary hypertension Blood pressure systolic is somewhat elevated but her diastolic is actually on the lower end and placing her on blood pressure medicines would actually increase her risk of falls.  Healthy diet regular activity recommended  2. Renal insufficiency Stable.  Will monitor every 6 months healthy diet recommended  3. Other hyperlipidemia Patient does not tolerate statins continue regimen yeast rice extract.  Follow-up if any ongoing troubles  4. Unsteady gait Proper use of cane encouraged and shown  5. Paroxysmal atrial fibrillation (HCC) On Xarelto no bleeding issues  6. Stage 3b chronic kidney disease ([place]) Kidney function stable monitor every 6 months lab work reviewed with patient  Neuropathy in the feet does not tolerate Neurontin has side effects we will stop Neurontin follow-up in 6 weeks consideration for other medicine will not use nortriptyline due to age

## 2021-01-26 ENCOUNTER — Telehealth: Payer: Self-pay | Admitting: Cardiology

## 2021-01-26 NOTE — Telephone Encounter (Signed)
Patient calling the office for samples of medication:   1.  What medication and dosage are you requesting samples for? Xarelto 15 mg   2.  Are you currently out of this medication?  Almost and she is in the donut hole

## 2021-01-27 NOTE — Telephone Encounter (Signed)
Pt is in the donut hole with medications and cannot afford her Xarelto. Pt does not qualify for PAFd/t being $200.00 over each month. Will send to Pharm D to see if there is anything we can do to help pt.   Xarelto Sample:  Lot#:  [id number] Exp: 04/24

## 2021-02-10 DIAGNOSIS — M79674 Pain in right toe(s): Secondary | ICD-10-CM | POA: Diagnosis not present

## 2021-02-10 DIAGNOSIS — M79675 Pain in left toe(s): Secondary | ICD-10-CM | POA: Diagnosis not present

## 2021-02-10 DIAGNOSIS — I739 Peripheral vascular disease, unspecified: Secondary | ICD-10-CM | POA: Diagnosis not present

## 2021-02-10 DIAGNOSIS — M79672 Pain in left foot: Secondary | ICD-10-CM | POA: Diagnosis not present

## 2021-02-10 DIAGNOSIS — M79671 Pain in right foot: Secondary | ICD-10-CM | POA: Diagnosis not present

## 2021-02-10 DIAGNOSIS — L110 Acquired keratosis follicularis: Secondary | ICD-10-CM | POA: Diagnosis not present

## 2021-02-23 ENCOUNTER — Telehealth: Payer: Self-pay | Admitting: Cardiology

## 2021-02-23 NOTE — Telephone Encounter (Signed)
Xarelto 15 mg daily #3 Lot [id number] exp 4/24  Confirmed dose with patient

## 2021-02-23 NOTE — Telephone Encounter (Signed)
Advised that samples are not available and will forward message to patient assistance department Verbalized understanding of plan

## 2021-02-23 NOTE — Telephone Encounter (Signed)
Patient calling the office for samples of medication:   1.  What medication and dosage are you requesting samples for? XARELTO 15 MG  2.  Are you currently out of this medication?   Yes - in the donut hole.

## 2021-03-04 ENCOUNTER — Other Ambulatory Visit: Payer: Self-pay

## 2021-03-04 ENCOUNTER — Ambulatory Visit (INDEPENDENT_AMBULATORY_CARE_PROVIDER_SITE_OTHER): Payer: Medicare Other | Admitting: Family Medicine

## 2021-03-04 VITALS — BP 138/66 | Ht 67.0 in | Wt 141.8 lb

## 2021-03-04 DIAGNOSIS — E7849 Other hyperlipidemia: Secondary | ICD-10-CM

## 2021-03-04 DIAGNOSIS — R7303 Prediabetes: Secondary | ICD-10-CM | POA: Diagnosis not present

## 2021-03-04 DIAGNOSIS — G609 Hereditary and idiopathic neuropathy, unspecified: Secondary | ICD-10-CM

## 2021-03-04 DIAGNOSIS — I10 Essential (primary) hypertension: Secondary | ICD-10-CM

## 2021-03-04 DIAGNOSIS — N289 Disorder of kidney and ureter, unspecified: Secondary | ICD-10-CM | POA: Diagnosis not present

## 2021-03-04 NOTE — Progress Notes (Signed)
   Subjective:    Patient ID: Becky Baird, female    DOB: March 08, 1934, 87 y.o.   MRN: [id number]  HPI Patient arrives for a follow up on neuropathy. Patient states she has continued problems with her foot. Primary hypertension - Plan: CBC with Differential/Platelet, Hepatic function panel  Renal insufficiency - Plan: Basic metabolic panel, CBC with Differential/Platelet, Hepatic function panel  Other hyperlipidemia - Plan: Lipid panel, Hepatic function panel  Idiopathic peripheral neuropathy  Prediabetes - Plan: CBC with Differential/Platelet, Hepatic function panel, Hemoglobin A1c  Patient's main problems is her neuropathy in her feet given her a lot of pain discomfort also makes it difficult for her to grip when she tries to get out of bed she did have 1 fall denies any loss of consciousness.  We did discuss risk of falling that can occur with getting older we also discussed balance exercises patient currently doing well with blood pressure.  Being followed by cardiology.  Tried gabapentin but had side effects with it.  Review of Systems     Objective:   Physical Exam General-in no acute distress Eyes-no discharge Lungs-respiratory rate normal, CTA CV-systolic murmurs,RRR Extremities skin warm dry no edema Neuro grossly normal Behavior normal, alert Right knee osteoarthritis noted no evidence of any type of fracture Blood pressure sitting and standing no appreciable changes 138/64  Foot exam no ulcers.  No infection.  Monofilament testing she consents 50% of the places I touched    Assessment & Plan:  1. Primary hypertension Blood pressure good control continue current measures no appreciable orthostatic hypotension - CBC with Differential/Platelet - Hepatic function panel  2. Renal insufficiency History renal insufficiency recheck metabolic 7 continue current medications do lab work before next visit in 3 months - Basic metabolic panel - CBC with  Differential/Platelet - Hepatic function panel  3. Other hyperlipidemia Due for cholesterol does not tolerate statins continue red rice yeast extract - Lipid panel - Hepatic function panel  4. Idiopathic peripheral neuropathy Tried the gabapentin but it caused neurologic/cognitive fog.  Tylenol seems to help some she will try this on a scheduled basis 3 times daily.  She may take maximum of 6/day.  Nerve conduction study would have minimal help for this  5. Prediabetes Minimize starches check A1c await results - CBC with Differential/Platelet - Hepatic function panel - Hemoglobin A1c  Patient does have intermittent falls she is doing some exercises to try to improve her balance family is thinking about getting a daytime attendant to help her with meals and housework.  Currently patient does not want to do alert necklace she will try to keep her cell phone with her when she is moving about the house

## 2021-03-04 NOTE — Patient Instructions (Signed)
Please do your lab work before your follow-up visit in 3 months

## 2021-04-06 DIAGNOSIS — Z23 Encounter for immunization: Secondary | ICD-10-CM | POA: Diagnosis not present

## 2021-04-06 DIAGNOSIS — Z20828 Contact with and (suspected) exposure to other viral communicable diseases: Secondary | ICD-10-CM | POA: Diagnosis not present

## 2021-04-12 ENCOUNTER — Telehealth: Payer: Self-pay | Admitting: Family Medicine

## 2021-04-12 NOTE — Telephone Encounter (Signed)
  No answer unable to leave a message for patient to call back and schedule Medicare Annual Wellness Visit (AWV) in office.   If unable to come into the office for AWV,  please offer to do virtually or by telephone.  No hx of AWV eligible for AWVI as of 07/25/2009  Please schedule at anytime with [facility] Advisor.      40 Minutes appointment   Any questions, please call me at [phone]

## 2021-04-19 DIAGNOSIS — R350 Frequency of micturition: Secondary | ICD-10-CM | POA: Diagnosis not present

## 2021-04-19 DIAGNOSIS — F4489 Other dissociative and conversion disorders: Secondary | ICD-10-CM | POA: Diagnosis not present

## 2021-04-19 DIAGNOSIS — N390 Urinary tract infection, site not specified: Secondary | ICD-10-CM | POA: Diagnosis not present

## 2021-04-19 DIAGNOSIS — I160 Hypertensive urgency: Secondary | ICD-10-CM | POA: Diagnosis not present

## 2021-04-20 ENCOUNTER — Other Ambulatory Visit: Payer: Self-pay | Admitting: Family Medicine

## 2021-04-21 ENCOUNTER — Telehealth: Payer: Self-pay | Admitting: Cardiology

## 2021-04-21 NOTE — Telephone Encounter (Signed)
Pt's daughter dropped off asst. Paper work in the [facility] [employer] office- sending interoffice to [employer]

## 2021-04-25 IMAGING — CT CT HEAD W/O CM
3 series · 16 of 47 positions shown, 19 images · non-contrast
Comparison: 04/07/2018

CLINICAL DATA: Ataxia, dizziness, history of stroke

EXAM:
CT HEAD WITHOUT CONTRAST
TECHNIQUE: Contiguous axial images were obtained from the base of the skull
through the vertex without intravenous contrast.

[Series 2: head w o · axial · 0.44mm/px · z∈[+187,+317]mm · 10 of 32 slices shown, 13 images]
[im 3/32  brain]
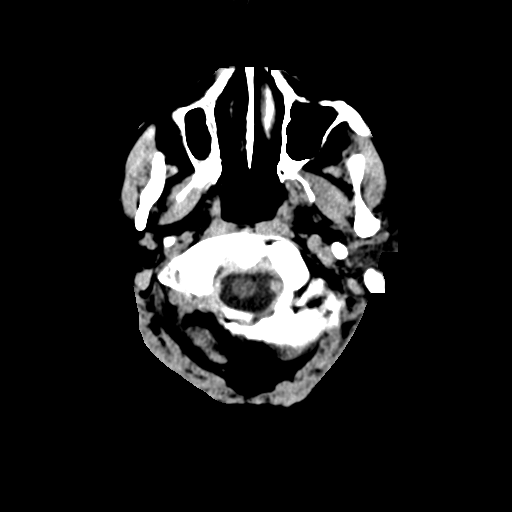
[im 3/32  bone]
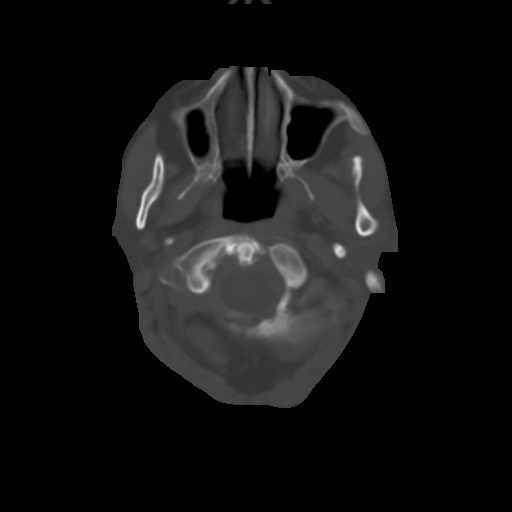
[im 6/32  brain]
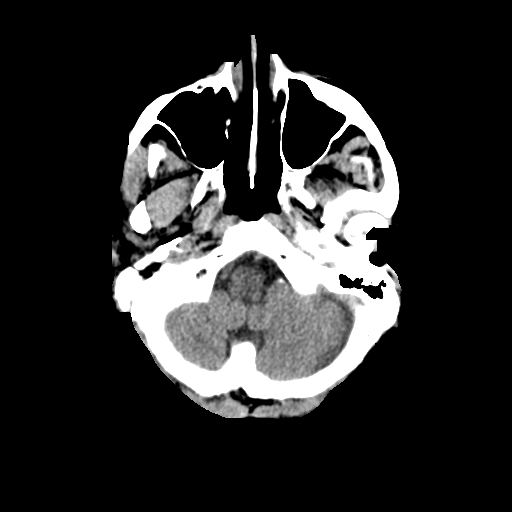
[im 9/32  brain]
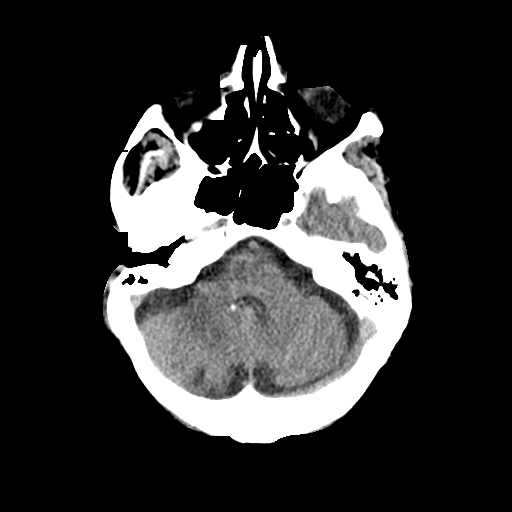
[im 11/32  brain]
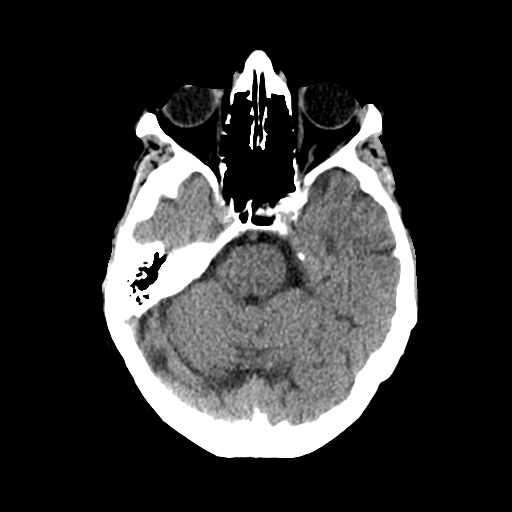
[im 14/32  brain]
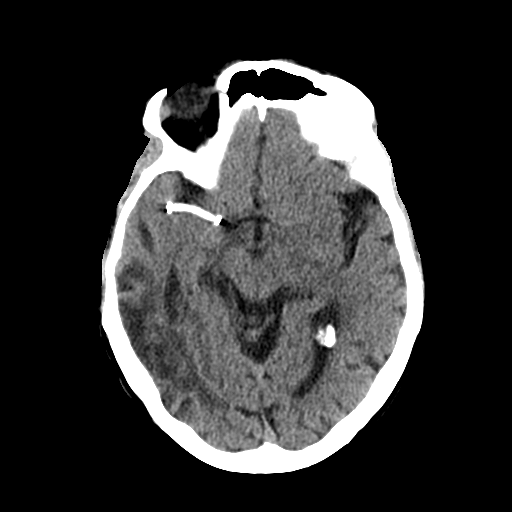
[im 14/32  bone]
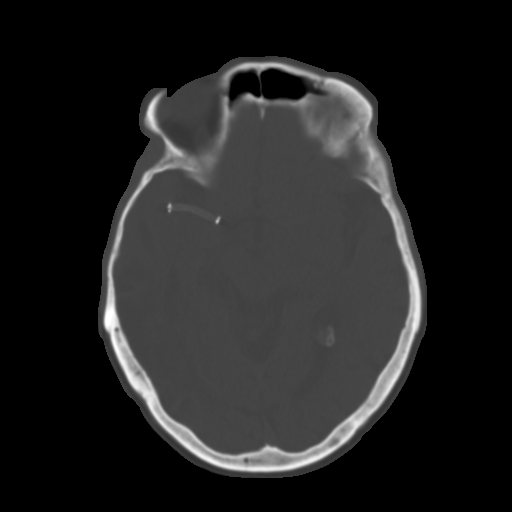
[im 18/32  brain]
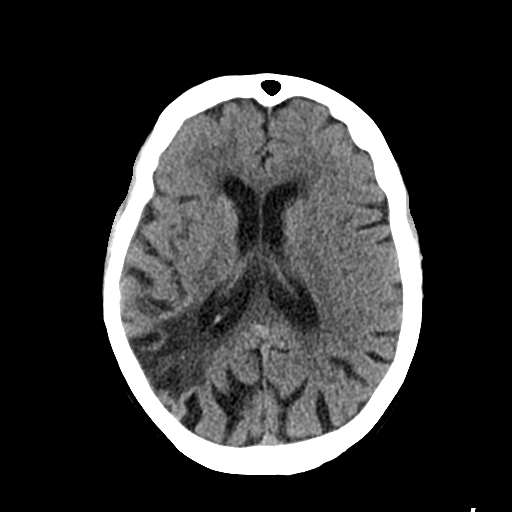
[im 21/32  brain]
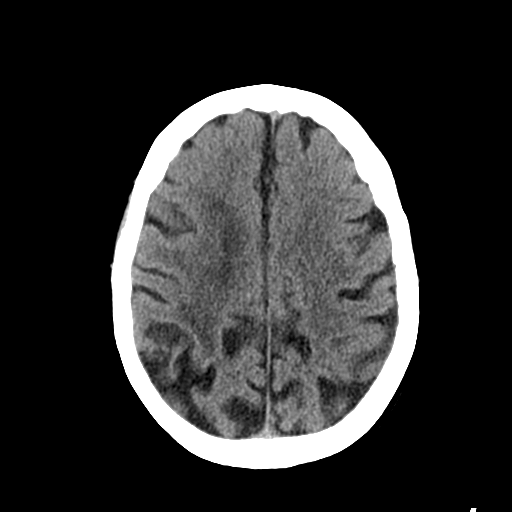
[im 24/32  brain]
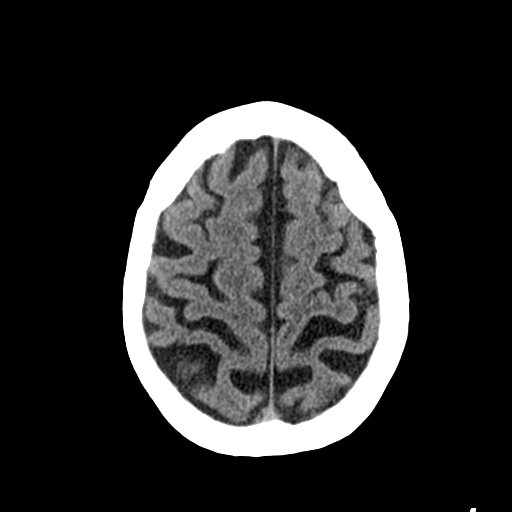
[im 26/32  brain]
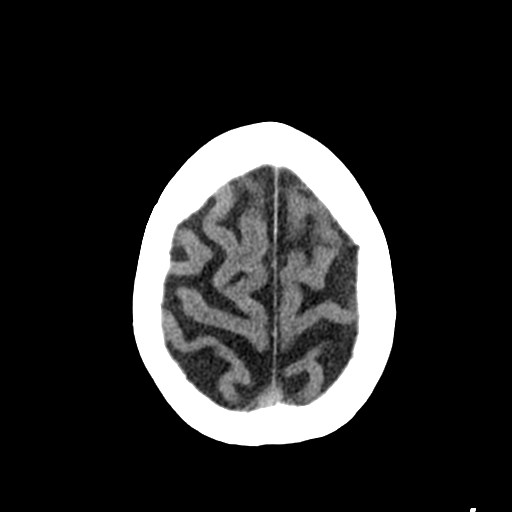
[im 26/32  bone]
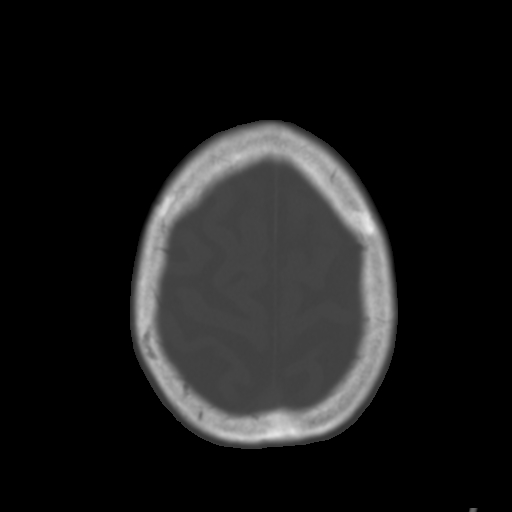
[im 29/32  brain]
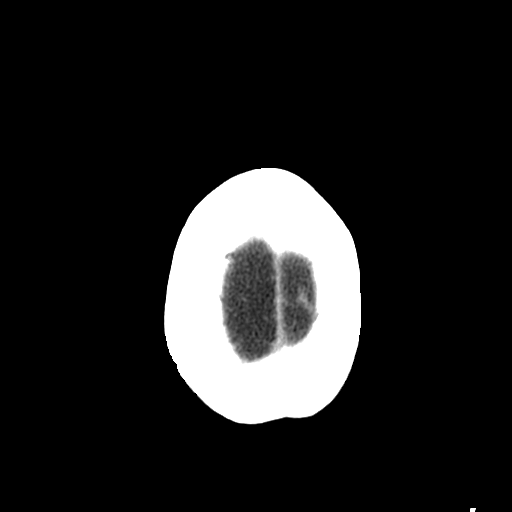

[Series 4: coronal soft · coronal · 0.34mm/px · 3 of 72 slices shown]
[im 24/72  brain]
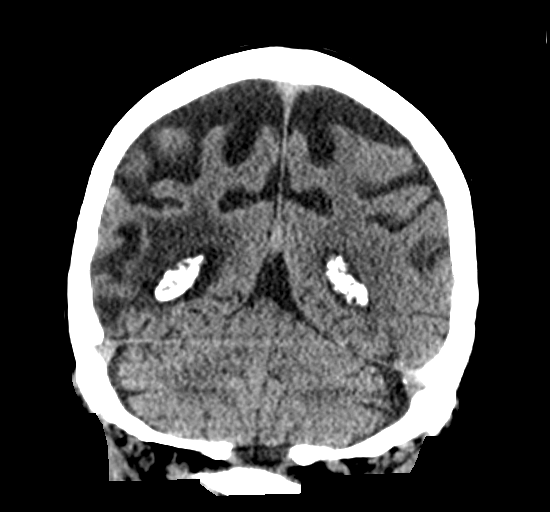
[im 32/72  brain]
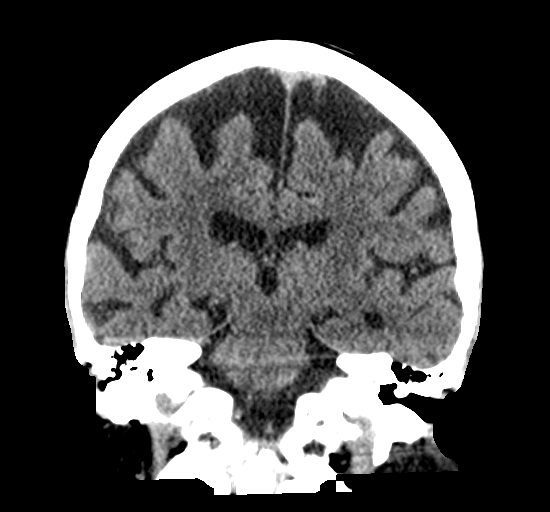
[im 40/72  brain]
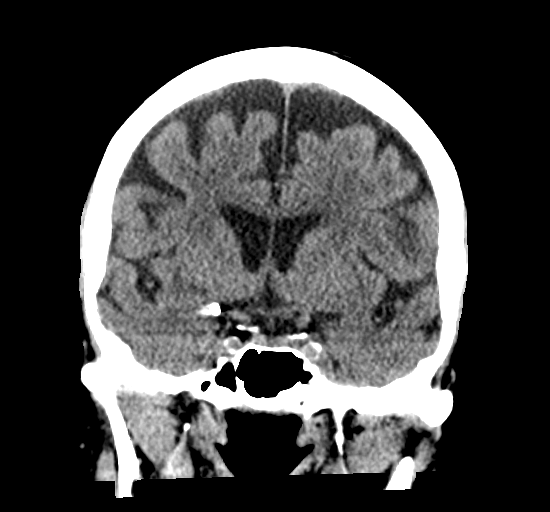

[Series 5: sagittal soft · sagittal · 0.34mm/px · 3 of 55 slices shown]
[im 19/55  brain]
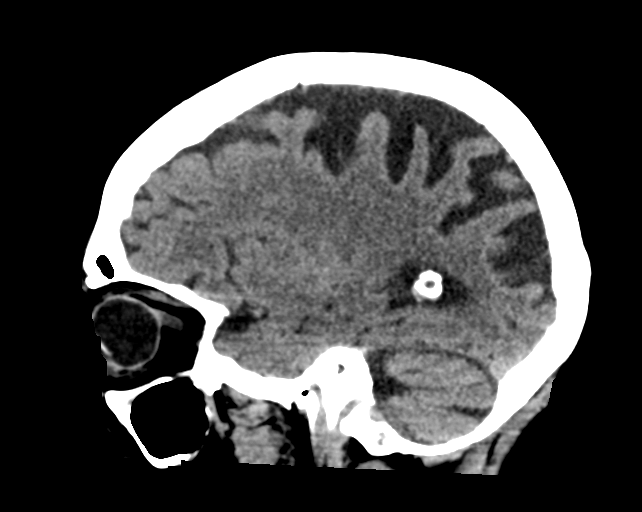
[im 28/55  brain]
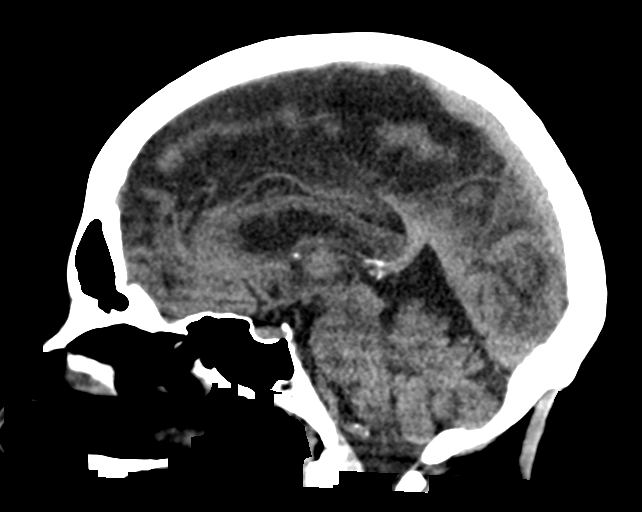
[im 37/55  brain]
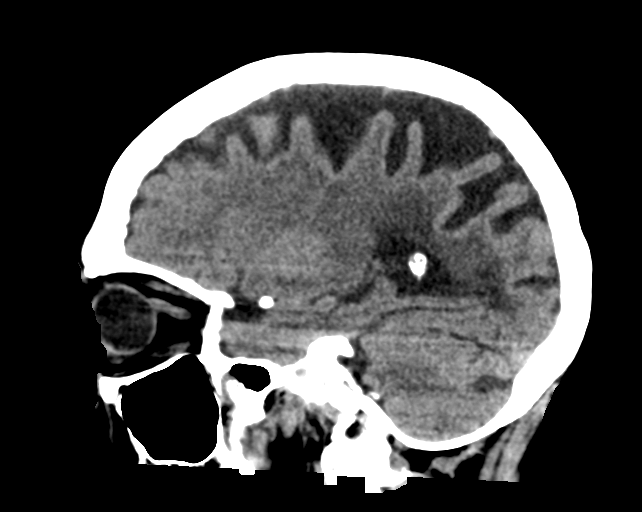

[16 of 47 positions shown; findings below may reference images not displayed]

FINDINGS: Brain: No acute infarct or hemorrhage. Chronic encephalomalacia
within the right temporal lobe. Chronic small vessel ischemic
changes are seen within the right basal ganglia and periventricular
white matter. Lateral ventricles and midline structures are
unremarkable. No acute extra-axial fluid collections. No mass
effect.

Vascular: Stable stent right MCA distribution. No hyperdense vessel.

Skull: Normal. Negative for fracture or focal lesion.

Sinuses/Orbits: No acute finding.

Other: None.
IMPRESSION: 1. Stable chronic ischemic changes.  No acute intracranial process.

## 2021-04-27 DIAGNOSIS — Z4689 Encounter for fitting and adjustment of other specified devices: Secondary | ICD-10-CM | POA: Diagnosis not present

## 2021-04-27 DIAGNOSIS — N8110 Cystocele, unspecified: Secondary | ICD-10-CM | POA: Diagnosis not present

## 2021-04-27 DIAGNOSIS — R300 Dysuria: Secondary | ICD-10-CM | POA: Diagnosis not present

## 2021-04-27 DIAGNOSIS — Z87440 Personal history of urinary (tract) infections: Secondary | ICD-10-CM | POA: Diagnosis not present

## 2021-04-27 DIAGNOSIS — L900 Lichen sclerosus et atrophicus: Secondary | ICD-10-CM | POA: Diagnosis not present

## 2021-04-28 DIAGNOSIS — M79675 Pain in left toe(s): Secondary | ICD-10-CM | POA: Diagnosis not present

## 2021-04-28 DIAGNOSIS — M79672 Pain in left foot: Secondary | ICD-10-CM | POA: Diagnosis not present

## 2021-04-28 DIAGNOSIS — M79671 Pain in right foot: Secondary | ICD-10-CM | POA: Diagnosis not present

## 2021-04-28 DIAGNOSIS — I739 Peripheral vascular disease, unspecified: Secondary | ICD-10-CM | POA: Diagnosis not present

## 2021-04-28 DIAGNOSIS — L110 Acquired keratosis follicularis: Secondary | ICD-10-CM | POA: Diagnosis not present

## 2021-04-28 DIAGNOSIS — M79674 Pain in right toe(s): Secondary | ICD-10-CM | POA: Diagnosis not present

## 2021-05-03 ENCOUNTER — Telehealth: Payer: Self-pay | Admitting: *Deleted

## 2021-05-03 ENCOUNTER — Telehealth: Payer: Self-pay | Admitting: Cardiology

## 2021-05-03 NOTE — Telephone Encounter (Signed)
Limited choices Could see her Wednesday afternoon (if they schedule for Wednesday afternoon I recommend blocking 2 slots because she has a lot going on)

## 2021-05-03 NOTE — Telephone Encounter (Signed)
CrCl 32, pt on correct dose of Xarelto 15mg  daily for afib indication.  Looks like pt also called in July and August asking for samples. July note states msg would be forwarded to pharmD to assist however message was never actually routed to PharmD. August note was routed to NL triage to follow up with pt assistance but looks like only samples were given and pt assistance was not discussed.  July note states pt doesn't qualify for Xarelto's normal pt assistance program, however anyone in the donut hole should qualify for the [employer] [facility] select program which brings copay down to $85/month while in the donut hole - pt can apply here: [email or web address] or call [phone]  Please advise pt to apply for this program if her current copay is > $85 and if $85/month copay would be affordable for her until her plan resets in January. Ok to provide 1-2 weeks of samples in the mean time.

## 2021-05-03 NOTE — Telephone Encounter (Signed)
Patient scheduled office visit 05/05/21 at 3 pm with Dr Nicki Reaper

## 2021-05-03 NOTE — Telephone Encounter (Signed)
Daughter [name] called and stated patient seen in Urgent care the end of September for UTI and her blood pressure was elevated- she was given antibiotic- finished antibiotic and went to GYN to have her pessary cleaned and they gave her a second antibiotic for UTI.  Neuropathy in her legs and feet is driving her crazy- she could not take the gabapentin and Tylenol is not helping- the pain is terrible and he legs and feet are swelling terrible and she can barely walk Patient also has H/As and SOB and has had a decline in her cognitive functions since her last visit Blood pressure yesterday was 144/90 which is pretty normal for her Patient has appt 05/10/21 for her regular check up but she doesn't think she can wait that long  Please advise  [name] [phone]

## 2021-05-03 NOTE — Telephone Encounter (Signed)
Spoke with pt, explained to pt the [employer]. Will send information through mychart per pt.

## 2021-05-03 NOTE — Telephone Encounter (Signed)
Patient calling the office for samples of medication:   1.  What medication and dosage are you requesting samples for? XARELTO 15 MG  .  Are you currently out of this medication?

## 2021-05-05 ENCOUNTER — Encounter: Payer: Self-pay | Admitting: Family Medicine

## 2021-05-05 ENCOUNTER — Other Ambulatory Visit: Payer: Self-pay

## 2021-05-05 ENCOUNTER — Ambulatory Visit (INDEPENDENT_AMBULATORY_CARE_PROVIDER_SITE_OTHER): Payer: Medicare Other | Admitting: Family Medicine

## 2021-05-05 VITALS — BP 158/53 | HR 62 | Temp 97.7°F | Ht 67.0 in | Wt 145.0 lb

## 2021-05-05 DIAGNOSIS — G609 Hereditary and idiopathic neuropathy, unspecified: Secondary | ICD-10-CM

## 2021-05-05 DIAGNOSIS — R600 Localized edema: Secondary | ICD-10-CM | POA: Diagnosis not present

## 2021-05-05 DIAGNOSIS — R0609 Other forms of dyspnea: Secondary | ICD-10-CM | POA: Diagnosis not present

## 2021-05-05 DIAGNOSIS — I872 Venous insufficiency (chronic) (peripheral): Secondary | ICD-10-CM

## 2021-05-05 DIAGNOSIS — M7741 Metatarsalgia, right foot: Secondary | ICD-10-CM

## 2021-05-05 DIAGNOSIS — M7742 Metatarsalgia, left foot: Secondary | ICD-10-CM

## 2021-05-05 NOTE — Progress Notes (Signed)
   Subjective:    Patient ID: Becky Baird, female    DOB: [birth year], 87 y.o.   MRN: [id number]  HPI Neuropathy bilateral feet B 12 levels taking otc 1000 mcg daily Completed ceph/ cipro ABX for bladder infection- saw gyn for pessari and UC- bladder infection 04/19/21 Very nice patient Has challenging issues with neuropathy in both feet A lot of pain and discomfort when she stands Also numbness Has severe orthopedic problems.  Walks with a cane.  At risk for falling. Patient also finds her self having low energy fatigue tired She also gets short of breath quickly with minimal activity She denies any major setbacks    Review of Systems     Objective:   Physical Exam  Heart murmur is noted seems to be about the same as it has been Has pedal edema more than likely due to venous insufficiency does not appear to be overt heart failure Extremities muscles are somewhat small Her feet have lost fat pad.  Also has neuropathy sensations.      Assessment & Plan:  1. Pedal edema Pedal edema along with DOE we will check BNP she will also do the other labs ordered back in August - B Nat Peptide  2. Venous insufficiency She does not tolerate the knee-high surgical support hose keep feet propped up when possible  3. Idiopathic peripheral neuropathy Will discuss with [facility] topical treatments for this  4. DOE (dyspnea on exertion) Her lungs are clear hearts with murmur but stable check BNP if significantly abnormal will need further intervention and potential follow-up with cardiology - B Nat Peptide  5. Metatarsalgia of both feet The metatarsalgia hopefully will respond to some of the topicals  Follow-up in 4 weeks

## 2021-05-06 ENCOUNTER — Telehealth: Payer: Self-pay | Admitting: Family Medicine

## 2021-05-06 LAB — BRAIN NATRIURETIC PEPTIDE: BNP: 68 pg/mL (ref 0.0–100.0)

## 2021-05-06 NOTE — Telephone Encounter (Signed)
Pt daughter [name] contacted and verbalized understanding. Daughter would like the topical med sent to [facility]. Daughter is aware that provider is not in office and that we will take of this tomorrow morning. [name] verbalized understanding.

## 2021-05-06 NOTE — Telephone Encounter (Signed)
  Nurses When I saw the patient yesterday we did discuss possibly utilizing a cream to help with her neuropathy on her feet and foot pain I had informed the family that we would discuss it with [place] [facility] Please connect with the daughter [name] So this medication is a combination of topical medicines to try to help with the neuropathy The direction is 1 to 2 x3-4 times per day applied to the foot I would recommend starting off with 1 pump 3-4 times per day to the bottom of each foot concentrating on the areas that give her the most trouble  Unfortunately these type of medications are not covered by Medicare plans  Apothecary stated that a 60 g pump would have an approximate cost of $50  The medication is a combination of diclofenac, baclofen, lidocaine  If they would rather try diclofenac gel which is available over-the-counter as an initial medicine for 3 to 4 weeks that may be less expensive  Please have family/patient discuss this then let us know what they would like to do  If they would like to utilize the pump medicine we can send that to apothecary-send me a note and I can write out the prescription thank you

## 2021-05-07 ENCOUNTER — Telehealth: Payer: Self-pay | Admitting: Family Medicine

## 2021-05-07 NOTE — Telephone Encounter (Signed)
Patient was told that a compound prescription would be called into [place] . Becky Baird went by to get it but wasn't there. Please advise

## 2021-05-07 NOTE — Telephone Encounter (Signed)
Prescription faxed to [facility] , confirmation received.

## 2021-05-07 NOTE — Telephone Encounter (Signed)
Faxed prescription for compound topical ointment to [facility] # [phone] , confirmation received .

## 2021-05-07 NOTE — Telephone Encounter (Signed)
I wrote out the prescription please send to [facility].

## 2021-05-18 ENCOUNTER — Ambulatory Visit: Payer: Medicare Other

## 2021-05-25 DIAGNOSIS — L900 Lichen sclerosus et atrophicus: Secondary | ICD-10-CM | POA: Diagnosis not present

## 2021-05-25 DIAGNOSIS — Z87440 Personal history of urinary (tract) infections: Secondary | ICD-10-CM | POA: Diagnosis not present

## 2021-05-25 DIAGNOSIS — Z4689 Encounter for fitting and adjustment of other specified devices: Secondary | ICD-10-CM | POA: Diagnosis not present

## 2021-05-25 DIAGNOSIS — N8110 Cystocele, unspecified: Secondary | ICD-10-CM | POA: Diagnosis not present

## 2021-06-02 ENCOUNTER — Other Ambulatory Visit: Payer: Self-pay

## 2021-06-02 ENCOUNTER — Encounter: Payer: Self-pay | Admitting: Family Medicine

## 2021-06-02 ENCOUNTER — Ambulatory Visit (INDEPENDENT_AMBULATORY_CARE_PROVIDER_SITE_OTHER): Payer: Medicare Other | Admitting: Family Medicine

## 2021-06-02 VITALS — BP 142/72 | Temp 97.2°F | Wt 144.0 lb

## 2021-06-02 DIAGNOSIS — I350 Nonrheumatic aortic (valve) stenosis: Secondary | ICD-10-CM

## 2021-06-02 DIAGNOSIS — Z23 Encounter for immunization: Secondary | ICD-10-CM | POA: Diagnosis not present

## 2021-06-02 NOTE — Progress Notes (Signed)
   Subjective:    Patient ID: Becky Baird, female    DOB: 1933/12/21, 87 y.o.   MRN: [id number]  HPI Pt here for follow up. Pt still having issues with feet. Is sleeping with feet up. Pt states she "gives out easy" if trying to do light house keeping.  Patient relates a lot of fatigue She states she gets significantly out of breath with mild activity She has noticed a significant difference in her motor stamina over the past 6 months and her quality of life is gone down She does have some intermittent swelling in the lower legs at the end of the day She denies PND denies chest pain Recent BNP negative  She also had neuropathy and we treated her with topical medications that was compounded at local pharmacy. Review of Systems     Objective:   Physical Exam  Lungs clear heart regular Harsh murmur noted of her aortic stenosis Extremities no edema      Assessment & Plan:  History of aortic stenosis Will connect with cardiology Patient may well benefit from having repeat echo It was to be done next year but may need to be done sooner because of her deteriorating ability to tolerate exertion  If her echo findings are stable then it is quite possible this is just deconditioning related to age  Neuropathy doing better with the topical continue this as needed basis  Follow-up in 3 months time sooner problems

## 2021-06-03 ENCOUNTER — Telehealth: Payer: Self-pay | Admitting: Family Medicine

## 2021-06-03 NOTE — Telephone Encounter (Signed)
Please let the daughter know that I did communicate with her cardiologist.  They will be ordering a echo.  When the result comes then cardiology will review it and decide if this is the source of her significant dyspnea on exertion or if it is more her age and deconditioning.  Cardiology should connect with family to set this up

## 2021-06-04 NOTE — Telephone Encounter (Signed)
Daughter [name](DPR) advised per Dr Nicki Reaper: communicate with her cardiologist.  They will be ordering a echo.  When the result comes then cardiology will review it and decide if this is the source of her significant dyspnea on exertion or if it is more her age and deconditioning.   Cardiology should connect with family to set this up  Daughter verbalized understanding.

## 2021-06-08 ENCOUNTER — Ambulatory Visit (INDEPENDENT_AMBULATORY_CARE_PROVIDER_SITE_OTHER): Payer: Medicare Other

## 2021-06-08 ENCOUNTER — Telehealth: Payer: Self-pay | Admitting: *Deleted

## 2021-06-08 ENCOUNTER — Other Ambulatory Visit: Payer: Self-pay

## 2021-06-08 VITALS — Ht 64.0 in | Wt 144.0 lb

## 2021-06-08 DIAGNOSIS — Z5986 Financial insecurity: Secondary | ICD-10-CM

## 2021-06-08 DIAGNOSIS — I350 Nonrheumatic aortic (valve) stenosis: Secondary | ICD-10-CM

## 2021-06-08 DIAGNOSIS — Z599 Problem related to housing and economic circumstances, unspecified: Secondary | ICD-10-CM

## 2021-06-08 DIAGNOSIS — Z0000 Encounter for general adult medical examination without abnormal findings: Secondary | ICD-10-CM | POA: Diagnosis not present

## 2021-06-08 NOTE — Patient Instructions (Signed)
Becky Baird , Thank you for taking time to come for your Medicare Wellness Visit. I appreciate your ongoing commitment to your health goals. Please review the following plan we discussed and let me know if I can assist you in the future.   Screening recommendations/referrals: Colonoscopy: No longer required. Mammogram: No longer required.  Bone Density: Done 09/03/2018 Repeat every 2 years  Recommended yearly ophthalmology/optometry visit for glaucoma screening and checkup Recommended yearly dental visit for hygiene and checkup  Vaccinations: Influenza vaccine: Done 06/02/21 Repeat annually  Pneumococcal vaccine: Done 11/18/2013 and 08/27/2015 Tdap vaccine: Done 07/17/2016 Repeat in 10 years  Shingles vaccine: Shingrix discussed. Please contact your pharmacy for coverage information.     Covid-19:Done 08/20/19, 09/17/19, 05/27/20, 11/10/20 and 04/06/21  Advanced directives: Please bring a copy of your health care power of attorney and living will to the office to be added to your chart at your convenience.   Conditions/risks identified: Aim for 30 minutes of exercise each day, drink 6-8 glasses of water and eat lots of fruits and vegetables.   Next appointment: Follow up in one year for your annual wellness visit 2023.   Preventive Care 70 Years and Older, Female Preventive care refers to lifestyle choices and visits with your health care provider that can promote health and wellness. What does preventive care include? A yearly physical exam. This is also called an annual well check. Dental exams once or twice a year. Routine eye exams. Ask your health care provider how often you should have your eyes checked. Personal lifestyle choices, including: Daily care of your teeth and gums. Regular physical activity. Eating a healthy diet. Avoiding tobacco and drug use. Limiting alcohol use. Practicing safe sex. Taking low-dose aspirin every day. Taking vitamin and mineral supplements as  recommended by your health care provider. What happens during an annual well check? The services and screenings done by your health care provider during your annual well check will depend on your age, overall health, lifestyle risk factors, and family history of disease. Counseling  Your health care provider may ask you questions about your: Alcohol use. Tobacco use. Drug use. Emotional well-being. Home and relationship well-being. Sexual activity. Eating habits. History of falls. Memory and ability to understand (cognition). Work and work Statistician. Reproductive health. Screening  You may have the following tests or measurements: Height, weight, and BMI. Blood pressure. Lipid and cholesterol levels. These may be checked every 5 years, or more frequently if you are over 64 years old. Skin check. Lung cancer screening. You may have this screening every year starting at age 53 if you have a 30-pack-year history of smoking and currently smoke or have quit within the past 15 years. Fecal occult blood test (FOBT) of the stool. You may have this test every year starting at age 47. Flexible sigmoidoscopy or colonoscopy. You may have a sigmoidoscopy every 5 years or a colonoscopy every 10 years starting at age 68. Hepatitis C blood test. Hepatitis B blood test. Sexually transmitted disease (STD) testing. Diabetes screening. This is done by checking your blood sugar (glucose) after you have not eaten for a while (fasting). You may have this done every 1-3 years. Bone density scan. This is done to screen for osteoporosis. You may have this done starting at age 58. Mammogram. This may be done every 1-2 years. Talk to your health care provider about how often you should have regular mammograms. Talk with your health care provider about your test results, treatment options, and if  necessary, the need for more tests. Vaccines  Your health care provider may recommend certain vaccines, such  as: Influenza vaccine. This is recommended every year. Tetanus, diphtheria, and acellular pertussis (Tdap, Td) vaccine. You may need a Td booster every 10 years. Zoster vaccine. You may need this after age 21. Pneumococcal 13-valent conjugate (PCV13) vaccine. One dose is recommended after age 65. Pneumococcal polysaccharide (PPSV23) vaccine. One dose is recommended after age 33. Talk to your health care provider about which screenings and vaccines you need and how often you need them. This information is not intended to replace advice given to you by your health care provider. Make sure you discuss any questions you have with your health care provider. Document Released: 08/07/2015 Document Revised: 03/30/2016 Document Reviewed: 05/12/2015 Elsevier Interactive Patient Education  2017 [place] Prevention in the Home Falls can cause injuries. They can happen to people of all ages. There are many things you can do to make your home safe and to help prevent falls. What can I do on the outside of my home? Regularly fix the edges of walkways and driveways and fix any cracks. Remove anything that might make you trip as you walk through a door, such as a raised step or threshold. Trim any bushes or trees on the path to your home. Use bright outdoor lighting. Clear any walking paths of anything that might make someone trip, such as rocks or tools. Regularly check to see if handrails are loose or broken. Make sure that both sides of any steps have handrails. Any raised decks and porches should have guardrails on the edges. Have any leaves, snow, or ice cleared regularly. Use sand or salt on walking paths during winter. Clean up any spills in your garage right away. This includes oil or grease spills. What can I do in the bathroom? Use night lights. Install grab bars by the toilet and in the tub and shower. Do not use towel bars as grab bars. Use non-skid mats or decals in the tub or  shower. If you need to sit down in the shower, use a plastic, non-slip stool. Keep the floor dry. Clean up any water that spills on the floor as soon as it happens. Remove soap buildup in the tub or shower regularly. Attach bath mats securely with double-sided non-slip rug tape. Do not have throw rugs and other things on the floor that can make you trip. What can I do in the bedroom? Use night lights. Make sure that you have a light by your bed that is easy to reach. Do not use any sheets or blankets that are too big for your bed. They should not hang down onto the floor. Have a firm chair that has side arms. You can use this for support while you get dressed. Do not have throw rugs and other things on the floor that can make you trip. What can I do in the kitchen? Clean up any spills right away. Avoid walking on wet floors. Keep items that you use a lot in easy-to-reach places. If you need to reach something above you, use a strong step stool that has a grab bar. Keep electrical cords out of the way. Do not use floor polish or wax that makes floors slippery. If you must use wax, use non-skid floor wax. Do not have throw rugs and other things on the floor that can make you trip. What can I do with my stairs? Do not leave any items  on the stairs. Make sure that there are handrails on both sides of the stairs and use them. Fix handrails that are broken or loose. Make sure that handrails are as long as the stairways. Check any carpeting to make sure that it is firmly attached to the stairs. Fix any carpet that is loose or worn. Avoid having throw rugs at the top or bottom of the stairs. If you do have throw rugs, attach them to the floor with carpet tape. Make sure that you have a light switch at the top of the stairs and the bottom of the stairs. If you do not have them, ask someone to add them for you. What else can I do to help prevent falls? Wear shoes that: Do not have high heels. Have  rubber bottoms. Are comfortable and fit you well. Are closed at the toe. Do not wear sandals. If you use a stepladder: Make sure that it is fully opened. Do not climb a closed stepladder. Make sure that both sides of the stepladder are locked into place. Ask someone to hold it for you, if possible. Clearly mark and make sure that you can see: Any grab bars or handrails. First and last steps. Where the edge of each step is. Use tools that help you move around (mobility aids) if they are needed. These include: Canes. Walkers. Scooters. Crutches. Turn on the lights when you go into a dark area. Replace any light bulbs as soon as they burn out. Set up your furniture so you have a clear path. Avoid moving your furniture around. If any of your floors are uneven, fix them. If there are any pets around you, be aware of where they are. Review your medicines with your doctor. Some medicines can make you feel dizzy. This can increase your chance of falling. Ask your doctor what other things that you can do to help prevent falls. This information is not intended to replace advice given to you by your health care provider. Make sure you discuss any questions you have with your health care provider. Document Released: 05/07/2009 Document Revised: 12/17/2015 Document Reviewed: 08/15/2014 Elsevier Interactive Patient Education  2017 [employer].

## 2021-06-08 NOTE — Progress Notes (Signed)
Subjective:   Becky Baird is a 87 y.o. female who presents for an Initial Medicare Annual Wellness Visit. Virtual Visit via Telephone Note  I connected with  Becky Baird on 06/08/21 at  1:40 PM EST by telephone and verified that I am speaking with the correct person using two identifiers.  Location: Patient: Home Provider: [facility] Persons participating in the virtual visit: patient/Nurse Health Advisor   I discussed the limitations, risks, security and privacy concerns of performing an evaluation and management service by telephone and the availability of in person appointments. The patient expressed understanding and agreed to proceed.  Interactive audio and video telecommunications were attempted between this nurse and patient, however failed, due to patient having technical difficulties OR patient did not have access to video capability.  We continued and completed visit with audio only.  Some vital signs may be absent or patient reported.   Becky Driver, LPN  Review of Systems     Cardiac Risk Factors include: advanced age (>43men, >72 women);hypertension;dyslipidemia;sedentary lifestyle     Objective:    Today's Vitals   06/08/21 1338  Weight: 144 lb (65.3 kg)  Height: 5\' 4"  (1.626 m)   Body mass index is 24.72 kg/m.  Advanced Directives 06/08/2021 11/10/2019 11/09/2019 12/10/2018 04/25/2018 04/07/2018 04/07/2018  Does Patient Have a Medical Advance Directive? Yes Yes Yes Yes Yes No No  Type of Paramedic of [place];Living will Living will Living will [place];Living will [place];Living will - -  Does patient want to make changes to medical advance directive? - No - Patient declined - No - Patient declined - - -  Copy of [place] in Chart? No - copy requested No - copy requested - No - copy requested No - copy requested - -  Would patient like information on creating a medical  advance directive? - - - No - Patient declined - No - Patient declined -    Current Medications (verified) Outpatient Encounter Medications as of 06/08/2021  Medication Sig   ALPRAZolam (XANAX) 0.25 MG tablet TAKE ONE TABLET BY MOUTH AT LUNCH TIME AS NEEDED THEN TAKE ONE TABLET BY MOUTH IN THE AFTERNOON AS NEEDED AND THEN ALSO TAKE ONE TABLET BY MOUTH AT BEDTIME AS NEEDED   clobetasol ointment (TEMOVATE) 1.02 % Apply 1 application topically 2 (two) times daily as needed.   estradiol (ESTRACE) 0.1 MG/GM vaginal cream Place 1 Applicatorful vaginally once a week.   feeding supplement (BOOST HIGH PROTEIN) LIQD Take 1 Container by mouth 3 (three) times daily between meals.   fish oil-omega-3 fatty acids 1000 MG capsule Take 2 g by mouth 2 (two) times a day.    hydrochlorothiazide (HYDRODIURIL) 12.5 MG tablet TAKE ONE TABLET BY MOUTH DAILY   Ketotifen Fumarate (ALAWAY OP) Apply 1 drop to eye daily as needed.   loratadine (CLARITIN) 10 MG tablet Take 1 tablet (10 mg total) by mouth daily. (Patient taking differently: Take 10 mg by mouth at bedtime.)   OVER THE COUNTER MEDICATION Take 1 capsule by mouth every morning. SUPREMA DOPHILUS   OVER THE COUNTER MEDICATION Take by mouth daily. Vitamin d3   pantoprazole (PROTONIX) 40 MG tablet TAKE ONE TABLET BY MOUTH DAILY   potassium chloride (KLOR-CON) 10 MEQ tablet TAKE ONE TABLET BY MOUTH TWICE DAILY   Red Yeast Rice 600 MG CAPS Take 1,200 mg by mouth 2 (two) times a day.    sodium chloride (OCEAN)  0.65 % nasal spray Place 1 spray into the nose as needed for congestion.    vitamin B-12 (CYANOCOBALAMIN) 1000 MCG tablet Take 1,000 mcg by mouth daily.   XARELTO 15 MG TABS tablet TAKE ONE TABLET BY MOUTH DAILY WITH SUPPER   [DISCONTINUED] estradiol (ESTRACE) 0.1 MG/GM vaginal cream Place vaginally.   allopurinol (ZYLOPRIM) 100 MG tablet Take 1 tablet (100 mg total) by mouth 2 (two) times daily. (Patient not taking: Reported on 06/08/2021)   Colchicine 0.6 MG  CAPS Take 0.6 mg by mouth daily as needed. (Patient not taking: Reported on 06/08/2021)   feeding supplement, GLUCERNA SHAKE, (GLUCERNA SHAKE) LIQD Take 237 mLs by mouth 3 (three) times daily between meals.  (Patient not taking: Reported on 06/08/2021)   No facility-administered encounter medications on file as of 06/08/2021.    Allergies (verified) Amlodipine, Neurontin [gabapentin], Niaspan [niacin er], and Oxybutynin   History: Past Medical History:  Diagnosis Date   Anxiety    Chronic back pain    Hyperlipidemia    Hypertension    Memory loss    Mild cognitive impairment, so stated    Occlusion and stenosis of carotid artery with cerebral infarction    Bilateral less than 50% carotid stenosis.   Persistent atrial fibrillation ([place]) 06/23/2016   Renal cyst    Sciatica    Stroke ([place])    2010, RMCA with stent   Toe fracture 11/2015   R toe   Ulcer 1970s   Past Surgical History:  Procedure Laterality Date   BIOPSY  04/25/2018   Procedure: BIOPSY;  Surgeon: Becky Houston, MD;  [facility];  Service: Endoscopy;;  gastric polyps   COLONOSCOPY  03/10/2011   Procedure: COLONOSCOPY;  Surgeon: Becky Houston, MD;  [facility];  Service: Endoscopy;  Laterality: N/A;   COLONOSCOPY N/A 08/24/2016   Procedure: COLONOSCOPY;  Surgeon: Becky Houston, MD;  [facility];  Service: Endoscopy;  Laterality: N/A;  [place]     years ago   ESOPHAGOGASTRODUODENOSCOPY N/A 04/25/2018   Procedure: ESOPHAGOGASTRODUODENOSCOPY (EGD);  Surgeon: Becky Houston, MD;  [facility];  Service: Endoscopy;  Laterality: N/A;  1:15   OVARIAN CYST REMOVAL     at least 10 years ago   POLYPECTOMY  08/24/2016   Procedure: POLYPECTOMY;  Surgeon: Becky Houston, MD;  [facility];  Service: Endoscopy;;  ascending colon; hepatic flexure   TONSILLECTOMY     childhood   Family History  Problem Relation Age of Onset    Stroke Mother    Heart failure Father    Diabetes Brother    Social History   Socioeconomic History   Marital status: Widowed    Spouse name: Not on file   Number of children: 2   Years of education: College   Highest education level: 12th grade  Occupational History    Employer: RETIRED  Tobacco Use   Smoking status: Never   Smokeless tobacco: Never  Vaping Use   Vaping Use: Never used  Substance and Sexual Activity   Alcohol use: No    Alcohol/week: 0.0 standard drinks   Drug use: No   Sexual activity: Not on file  Other Topics Concern   Not on file  Social History Narrative   Patient lives at home alone.    2 "wonderful" daughters per pt.    3 grandchildren.   Caffeine Use: very little, rarely  Social Determinants of Health   Financial Resource Strain: Low Risk    Difficulty of Paying Living Expenses: Not hard at all  Food Insecurity: No Food Insecurity   Worried About Charity fundraiser in the Last Year: Never true   [place] in the Last Year: Never true  Transportation Needs: No Transportation Needs   Lack of Transportation (Medical): No   Lack of Transportation (Non-Medical): No  Physical Activity: Insufficiently Active   Days of Exercise per Week: 5 days   Minutes of Exercise per Session: 20 min  Stress: No Stress Concern Present   Feeling of Stress : Not at all  Social Connections: Socially Isolated   Frequency of Communication with Friends and Family: More than three times a week   Frequency of Social Gatherings with Friends and Family: More than three times a week   Attends Religious Services: Never   Marine scientist or Organizations: No   Attends Archivist Meetings: Never   Marital Status: Widowed    Tobacco Counseling Counseling given: Not Answered   Clinical Intake:  Pre-visit preparation completed: Yes  Pain : No/denies pain     BMI - recorded: 24.72 Nutritional Status: BMI of 19-24  Normal Nutritional  Risks: None Diabetes: No  How often do you need to have someone help you when you read instructions, pamphlets, or other written materials from your doctor or pharmacy?: 1 - Never  Diabetic?no  Interpreter Needed?: No  Information entered by :: [place] of Daily Living In your present state of health, do you have any difficulty performing the following activities: 06/08/2021  Hearing? N  Vision? N  Difficulty concentrating or making decisions? Y  Walking or climbing stairs? N  Dressing or bathing? N  Doing errands, shopping? N  Preparing Food and eating ? N  Using the Toilet? N  In the past six months, have you accidently leaked urine? Y  Do you have problems with loss of bowel control? N  Managing your Medications? N  Managing your Finances? N  Housekeeping or managing your Housekeeping? N  Some recent data might be hidden    Patient Care Team: Kathyrn Drown, MD as PCP - General (Family Medicine) Minus Breeding, MD as PCP - Cardiology (Cardiology) Elmarie Shiley, MD as Consulting Physician (Nephrology)  Indicate any recent Medical Services you may have received from other than [facility] providers in the past year (date may be approximate).     Assessment:   This is a routine wellness examination for Desha.  Hearing/Vision screen Hearing Screening - Comments:: No hearing issues.   Vision Screening - Comments:: Glasses. Dr. Katy Fitch. 2021.  Dietary issues and exercise activities discussed: Current Exercise Habits: Home exercise routine, Type of exercise: Other - see comments (balance exercises), Time (Minutes): 20, Frequency (Times/Week): 3, Weekly Exercise (Minutes/Week): 60, Intensity: Mild, Exercise limited by: orthopedic condition(s);cardiac condition(s)   Goals Addressed             This Visit's Progress    Have 3 meals a day        Depression Screen PHQ 2/9 Scores 06/08/2021 01/14/2021 12/09/2020 07/30/2018 04/23/2017 01/05/2017 08/27/2015  PHQ - 2  Score 1 6 0 0 4 0 0  PHQ- 9 Score - 13 - 0 13 - -    Fall Risk Fall Risk  06/08/2021 03/04/2021 01/14/2021 12/09/2020 06/02/2020  Falls in the past year? 1 1 0 0 1  Comment - - - - -  Number falls in past yr: 0 1 - 0 0  Injury with Fall? 1 0 - 0 0  Risk for fall due to : History of fall(s);Impaired balance/gait;Impaired mobility Impaired balance/gait;Impaired mobility Impaired balance/gait;Impaired mobility - Impaired balance/gait  Follow up Falls prevention discussed Falls evaluation completed Falls evaluation completed Falls evaluation completed Education provided;Falls evaluation completed    FALL RISK PREVENTION PERTAINING TO THE HOME:  Any stairs in or around the home? No  If so, are there any without handrails? No  Home free of loose throw rugs in walkways, pet beds, electrical cords, etc? Yes  Adequate lighting in your home to reduce risk of falls? Yes   ASSISTIVE DEVICES UTILIZED TO PREVENT FALLS:  Life alert? No  Use of a cane, walker or w/c? Yes  Grab bars in the bathroom? Yes  Shower chair or bench in shower? No  Elevated toilet seat or a handicapped toilet? Yes   TIMED UP AND GO:  Was the test performed? No .  Phone visit.   Cognitive Function:     6CIT Screen 06/08/2021  What Year? 0 points  What month? 0 points  What time? 0 points  Count back from 20 0 points  Months in reverse 2 points  Repeat phrase 0 points  Total Score 2    Immunizations Immunization History  Administered Date(s) Administered   Fluad Quad(high Dose 65+) 04/28/2020, 06/02/2021   Influenza Inj Mdck Quad With Preservative 05/18/2018   Influenza,inj,Quad PF,6+ Mos 04/18/2017   Influenza-Unspecified 04/01/2014, 04/16/2015, 04/02/2016, 04/02/2016, 05/18/2018   Moderna Sars-Covid-2 Vaccination 08/20/2019, 09/17/2019, 05/27/2020, 11/10/2020   Pfizer Covid-19 Vaccine Bivalent Booster 5y-11y 04/06/2021   Pneumococcal Conjugate-13 11/18/2013   Pneumococcal Polysaccharide-23 08/27/2015   Td  11/18/2013   Tdap 07/17/2016   Zoster Recombinat (Shingrix) 02/15/2017, 05/18/2017   Zoster, Live 05/25/2010    TDAP status: Up to date  Flu Vaccine status: Up to date  Pneumococcal vaccine status: Up to date  Covid-19 vaccine status: Completed vaccines  Qualifies for Shingles Vaccine? Yes   Zostavax completed Yes   Shingrix Completed?: Yes  Screening Tests Health Maintenance  Topic Date Due   COLONOSCOPY (Pts 45-58yrs Insurance coverage will need to be confirmed)  08/24/2021   TETANUS/TDAP  07/17/2026   Pneumonia Vaccine 60+ Years old  Completed   INFLUENZA VACCINE  Completed   DEXA SCAN  Completed   COVID-19 Vaccine  Completed   Zoster Vaccines- Shingrix  Completed   HPV VACCINES  Aged Out    Health Maintenance  There are no preventive care reminders to display for this patient.  Colorectal cancer screening: No longer required.   Mammogram status: No longer required due to age.  Bone Density status: Completed 09/03/2018. Results reflect: Bone density results: OSTEOPOROSIS. Repeat every 2 years.  Lung Cancer Screening: (Low Dose CT Chest recommended if Age 50-80 years, 30 pack-year currently smoking OR have quit w/in 15years.) does not qualify.  Non smoker.  Additional Screening:  Hepatitis C Screening: does not qualify.  Vision Screening: Recommended annual ophthalmology exams for early detection of glaucoma and other disorders of the eye. Is the patient up to date with their annual eye exam?  Yes  Who is the provider or what is the name of the office in which the patient attends annual eye exams? Dr. Katy Fitch If pt is not established with a provider, would they like to be referred to a provider to establish care? No .   Dental Screening: Recommended annual dental exams for proper  oral hygiene  Community Resource Referral / Chronic Care Management: CRR required this visit?  No   CCM required this visit?  Yes      Plan:     I have personally reviewed and  noted the following in the patient's chart:   Medical and social history Use of alcohol, tobacco or illicit drugs  Current medications and supplements including opioid prescriptions. Patient is not currently taking opioid prescriptions. Functional ability and status Nutritional status Physical activity Advanced directives List of other physicians Hospitalizations, surgeries, and ER visits in previous 12 months Vitals Screenings to include cognitive, depression, and falls Referrals and appointments  In addition, I have reviewed and discussed with patient certain preventive protocols, quality metrics, and best practice recommendations. A written personalized care plan for preventive services as well as general preventive health recommendations were provided to patient.     Becky Driver, LPN   [id number]   Nurse Notes: Pt having issues affording Xarelto. Offered referral to [facility]. Pt agreeable and referral placed. Up to date on all age appropriate health maintenance. Up to date on all vaccines.

## 2021-06-08 NOTE — Telephone Encounter (Signed)
-----   Message from Minus Breeding, MD sent at 06/02/2021  8:56 PM EST ----- Becky Baird,  Yes thanks.  I will order it at [facility] so it comes to my inbox and I will forward the result to you and see her if necessary.  Maylon Cos ----- Message ----- From: Kathyrn Drown, MD Sent: 06/02/2021   8:43 PM EST To: Minus Breeding, MD  Mutual patient-over the past few months she has noticed a decreased exercise capacity with increased shortness of breath with activity.  As you are aware she has history of aortic stenosis.  No evidence of heart failure on exam recent BNP negative.  My question-would she benefit from a repeat echo?(If the DOE is not related to that it could be related to deconditioning) thank you-Scott Luking primary care

## 2021-06-09 ENCOUNTER — Telehealth: Payer: Self-pay | Admitting: *Deleted

## 2021-06-09 NOTE — Telephone Encounter (Signed)
-----   Message from Minus Breeding, MD sent at 06/02/2021  8:56 PM EST ----- Becky Baird,  Yes thanks.  I will order it at [facility] so it comes to my inbox and I will forward the result to you and see her if necessary.  Maylon Cos ----- Message ----- From: Kathyrn Drown, MD Sent: 06/02/2021   8:43 PM EST To: Minus Breeding, MD  Mutual patient-over the past few months she has noticed a decreased exercise capacity with increased shortness of breath with activity.  As you are aware she has history of aortic stenosis.  No evidence of heart failure on exam recent BNP negative.  My question-would she benefit from a repeat echo?(If the DOE is not related to that it could be related to deconditioning) thank you-Scott Luking primary care

## 2021-06-09 NOTE — Chronic Care Management (AMB) (Signed)
  Chronic Care Management   Note  06/09/2021 Name: Becky Baird MRN: [id number] DOB: 1934/02/06  Becky Baird is a 87 y.o. year old female who is a primary care patient of Luking, Elayne Snare, MD. I reached out to [name] by phone today in response to a referral sent by Becky Baird's PCP.  Becky Baird was given information about Chronic Care Management services today including:  CCM service includes personalized support from designated clinical staff supervised by her physician, including individualized plan of care and coordination with other care providers 24/7 contact phone numbers for assistance for urgent and routine care needs. Service will only be billed when office clinical staff spend 20 minutes or more in a month to coordinate care. Only one practitioner may furnish and bill the service in a calendar month. The patient may stop CCM services at any time (effective at the end of the month) by phone call to the office staff. The patient is responsible for co-pay (up to 20% after annual deductible is met) if co-pay is required by the individual health plan.   Patient agreed to services and verbal consent obtained.   Follow up plan: Telephone appointment with care management team member scheduled for:06/21/21  [facility]  Direct Dial: [phone]

## 2021-06-09 NOTE — Telephone Encounter (Signed)
Spoke with pt daughter,  aware order for echo placed for anne penn and scheduled. Daughter given the number to call to reschedule at her convenience.

## 2021-06-10 ENCOUNTER — Ambulatory Visit: Payer: Medicare Other | Admitting: Family Medicine

## 2021-06-14 ENCOUNTER — Other Ambulatory Visit: Payer: Self-pay | Admitting: Family Medicine

## 2021-06-21 ENCOUNTER — Ambulatory Visit (INDEPENDENT_AMBULATORY_CARE_PROVIDER_SITE_OTHER): Payer: Medicare Other | Admitting: Pharmacist

## 2021-06-21 DIAGNOSIS — I10 Essential (primary) hypertension: Secondary | ICD-10-CM

## 2021-06-21 DIAGNOSIS — I480 Paroxysmal atrial fibrillation: Secondary | ICD-10-CM

## 2021-06-21 DIAGNOSIS — E7849 Other hyperlipidemia: Secondary | ICD-10-CM

## 2021-06-21 NOTE — Chronic Care Management (AMB) (Addendum)
Further discussion was held with clinical pharmacist.  I agree with trying to do the best possible and keeping her blood pressure under control.  Given the her age and frailty I recommend following AFP guidelines to keep blood pressure less than 140/90.  We will certainly consider adjustments to medication on follow-up visit including indapamide and hydralazine but given her frailty will less likely utilize chlorthalidone.  This addendum is cosigning the note with the clinical pharmacist.  Dr. [name] family medicine   Chronic Care Management Pharmacy Note  07/05/2021 Name:  Becky Baird MRN:  [id number] DOB:  [birth year]  Summary:  Hypertension Blood pressure under poor control. Blood pressure is above goal of <130/80 mmHg per 2017 AHA/ACC guidelines. Current medications: hydrochlorothiazide 12.5 mg by mouth once daily Recommend home blood pressure monitoring to discuss at next visit After next visit, if home BP elevated, consider switching HCTZ to indapamide for more potent blood pressure lowering (especially at lower renal function). Could also hydralazine given current renal function.  Hyperlipidemia/History of stroke/TIA Uncontrolled. LDL above goal of <70 due to very high risk given established clinical ASCVD per 2020 AACE/ACE guidelines.  Current medications: red yeast rice and fish oil Intolerances: rosuvastatin took for 8-12 weeks (myalgias) Re-check lipid panel (last 04/2020) Consider addition of ezetimibe 10 mg by mouth daily to lower LDL to goal. May also need to consider Nexletol. Will discuss with PCP.  Atrial Fibrillation Follows with Dr. Percival Spanish Continue rivaroxaban (Xarelto) 15 mg by mouth once daily. Dose appropriate based on renal function and indication. Patient reports she has enough supply to last through the end of the year. Typically has issues when she goes into the donut hole. Patient instructed to contact [employer] to make sure she has the best Medicare  prescription drug coverage she qualifies for in 2023. If patient reaches donut hole in 2023, can help patient apply for [employer] to lower copay while in donut hole.   Subjective: Becky Baird is an 87 y.o. year old female who is a primary patient of Luking, Elayne Snare, MD.  The CCM team was consulted for assistance with disease management and care coordination needs.    Engaged with patient by telephone for initial visit in response to provider referral for pharmacy case management and/or care coordination services.   Consent to Services:  The patient was given the following information about Chronic Care Management services today, agreed to services, and gave verbal consent: 1. CCM service includes personalized support from designated clinical staff supervised by the primary care provider, including individualized plan of care and coordination with other care providers 2. 24/7 contact phone numbers for assistance for urgent and routine care needs. 3. Service will only be billed when office clinical staff spend 20 minutes or more in a month to coordinate care. 4. Only one practitioner may furnish and bill the service in a calendar month. 5.The patient may stop CCM services at any time (effective at the end of the month) by phone call to the office staff. 6. The patient will be responsible for cost sharing (co-pay) of up to 20% of the service fee (after annual deductible is met). Patient agreed to services and consent obtained.  Patient Care Team: Kathyrn Drown, MD as PCP - General (Family Medicine) Minus Breeding, MD as PCP - Cardiology (Cardiology) Elmarie Shiley, MD as Consulting Physician (Nephrology) Beryle Lathe, [facility] (Pharmacist)  Objective:  Lab Results  Component Value Date   CREATININE 1.30 (H) 12/09/2020  CREATININE 1.38 (H) 10/08/2020   CREATININE 1.46 (H) 04/24/2020    Lab Results  Component Value Date   HGBA1C 5.7 (H) 11/10/2019   Last diabetic Eye exam:  Lab  Results  Component Value Date/Time   HMDIABEYEEXA No Retinopathy 04/24/2014 12:00 AM    Last diabetic Foot exam: No results found for: HMDIABFOOTEX      Component Value Date/Time   CHOL 202 (H) 04/24/2020 0934   TRIG 56 04/24/2020 0934   HDL 71 04/24/2020 0934   CHOLHDL 2.8 04/24/2020 0934   CHOLHDL 2.8 11/10/2019 0000   VLDL 13 11/10/2019 0000   LDLCALC 121 (H) 04/24/2020 0934    Hepatic Function Latest Ref Rng & Units 11/10/2019 04/07/2018 11/28/2017  Total Protein 6.5 - 8.1 g/dL 6.5 6.5 7.4  Albumin 3.5 - 5.0 g/dL 4.2 3.3(L) 3.5  AST 15 - 41 U/L 24 32 27  ALT 0 - 44 U/L '20 20 16  ' Alk Phosphatase 38 - 126 U/L 60 54 136(H)  Total Bilirubin 0.3 - 1.2 mg/dL 1.0 1.1 1.2  Bilirubin, Direct 0.00 - 0.40 mg/dL - - -    Lab Results  Component Value Date/Time   TSH 3.550 12/16/2019 09:48 AM   TSH 2.520 07/30/2018 11:56 AM    CBC Latest Ref Rng & Units 04/24/2020 11/10/2019 12/13/2018  WBC 3.4 - 10.8 x10E3/uL 6.0 6.5 -  Hemoglobin 11.1 - 15.9 g/dL 12.8 11.7(L) 12.5  Hematocrit 34.0 - 46.6 % 38.2 35.4(L) -  Platelets 150 - 450 x10E3/uL 253 200 -    Lab Results  Component Value Date/Time   VD25OH 50 05/29/2014 07:32 AM    Clinical ASCVD: Yes  The ASCVD Risk score (Arnett DK, et al., 2019) failed to calculate for the following reasons:   The 2019 ASCVD risk score is only valid for ages 30 to 87   The patient has a prior MI or stroke diagnosis    Social History   Tobacco Use  Smoking Status Never  Smokeless Tobacco Never   BP Readings from Last 3 Encounters:  06/02/21 (!) 142/72  05/05/21 (!) 158/53  03/04/21 138/66   Pulse Readings from Last 3 Encounters:  05/05/21 62  01/14/21 67  12/09/20 77   Wt Readings from Last 3 Encounters:  06/08/21 144 lb (65.3 kg)  06/02/21 144 lb (65.3 kg)  05/05/21 145 lb (65.8 kg)    Assessment: Review of patient past medical history, allergies, medications, health status, including review of consultants reports, laboratory and other  test data, was performed as part of comprehensive evaluation and provision of chronic care management services.   SDOH:  (Social Determinants of Health) assessments and interventions performed:    CCM Care Plan  Allergies  Allergen Reactions   Amlodipine     Pedal edema   Statins Other (See Comments)    Myalgias with rosuvastatin   Neurontin [Gabapentin]     Cognitive issues, creates fogginess   Niaspan [Niacin Er] Rash   Oxybutynin     Nausea sweats    Medications Reviewed Today     Reviewed by Beryle Lathe, [facility] (Pharmacist) on 06/21/21 at 1110  Med List Status: <None>   Medication Order Taking? Sig Documenting Provider Last Dose Status Informant  ALPRAZolam (XANAX) 0.25 MG tablet [id number] Yes TAKE ONE TABLET BY MOUTH AT LUNCH TIME AS NEEDED THEN TAKE ONE TABLET BY MOUTH IN THE AFTERNOON AS NEEDED AND THEN ALSO TAKE ONE TABLET BY MOUTH AT BEDTIME AS NEEDED Kathyrn Drown, MD Taking  Active   clobetasol ointment (TEMOVATE) 0.05 % [id number] Yes Apply 1 application topically 2 (two) times daily as needed. [provider] Taking Active Family Member  estradiol (ESTRACE) 0.1 MG/GM vaginal cream [id number] Yes Place 1 Applicatorful vaginally once a week. [provider] Taking Active Family Member           Med Note Rhea Belton Jun 21, 2021 11:03 AM) Using Mondays and Fridays  feeding supplement (BOOST HIGH PROTEIN) LIQD [id number] Yes Take 1 Container by mouth 3 (three) times daily between meals. [provider] Taking Active            Med Note Owens Shark, Zebedee Iba   Tue Apr 28, 2020  1:49 PM) One a day  feeding supplement, GLUCERNA SHAKE, (GLUCERNA SHAKE) LIQD [id number] No Take 237 mLs by mouth 3 (three) times daily between meals.   Patient not taking: Reported on 06/08/2021   [provider] Not Taking Active   fish oil-omega-3 fatty acids 1000 MG capsule [id number] Yes Take 2 g by mouth 2 (two) times a day.  [provider] Taking Active Family Member  hydrochlorothiazide (HYDRODIURIL) 12.5 MG tablet [id number] Yes TAKE ONE TABLET BY MOUTH DAILY Wolfgang Phoenix, Elayne Snare, MD Taking Active   Ketotifen Fumarate (ALAWAY OP) [id number] Yes Apply 1 drop to eye daily as needed. [provider] Taking Active Family Member           Med Note Langston Masker, Doyle Askew   Tue Jun 08, 2021  1:44 PM) PRN   loratadine (CLARITIN) 10 MG tablet [id number] Yes Take 1 tablet (10 mg total) by mouth daily.  Patient taking differently: Take 10 mg by mouth at bedtime.   Kathyrn Drown, MD Taking Active   OVER THE COUNTER MEDICATION [id number] Yes Take 1 capsule by mouth every morning. [place] [provider] Taking Active Family Member  OVER THE [place] [id number] Yes Take by mouth daily. Vitamin D3 2,000 units daily [provider] Taking Active   pantoprazole (PROTONIX) 40 MG tablet [id number] Yes TAKE ONE TABLET BY MOUTH DAILY Wolfgang Phoenix, Elayne Snare, MD Taking Active   potassium chloride (KLOR-CON) 10 MEQ tablet [id number] Yes TAKE ONE TABLET BY MOUTH TWICE DAILY Luking, Elayne Snare, MD Taking Active   Red Yeast Rice 600 MG CAPS [id number] Yes Take 1,200 mg by mouth 2 (two) times a day.  [provider] Taking Active Family Member  sodium chloride (OCEAN) 0.65 % nasal spray [id number] Yes Place 1 spray into the nose as needed for congestion.  [provider] Taking Active Family Member  vitamin B-12 (CYANOCOBALAMIN) 1000 MCG tablet [id number] Yes Take 1,000 mcg by mouth daily. [provider] Taking Active   XARELTO 15 MG TABS tablet [id number] Yes TAKE ONE TABLET BY MOUTH DAILY WITH SUPPER Minus Breeding, MD Taking Active             Patient Active Problem List   Diagnosis Date Noted   Idiopathic peripheral neuropathy 03/04/2021   Right calf pain 10/27/2020   Right leg swelling 10/27/2020   Myalgia due to statin 10/08/2020   Aortic atherosclerosis ([place]) 04/28/2020    Stenosis of carotid artery 12/03/2019   Bradycardia 12/04/2018   Nonrheumatic aortic valve stenosis 12/04/2018   Pyelonephritis 04/07/2018   Acute right-sided low back pain with right-sided sciatica 11/24/2017   Unsteady gait 11/24/2017   Primary osteoarthritis of both hands 11/13/2017   Paroxysmal atrial fibrillation ([place]) 11/23/2016   Murmur 11/23/2016  TIA (transient ischemic attack) 05/12/2016   Urinary tract infection without hematuria    History of colonic polyps 05/10/2016   History of stroke 03/03/2015   GERD (gastroesophageal reflux disease) 02/06/2015   Aortic sclerosis 12/02/2014   Osteopenia 01/14/2014   Prediabetes 08/05/2013   Hypercalcemia 04/22/2013   Renal insufficiency 02/15/2013   Mild cognitive impairment 01/03/2013   Memory loss 01/03/2013   Ulcer 10/12/2012   Hypertension 10/12/2012   Hyperlipidemia 10/12/2012   Cerebral infarction ([place]) 10/12/2012   HIP PAIN, RIGHT 12/29/2009    Immunization History  Administered Date(s) Administered   Fluad Quad(high Dose 65+) 04/28/2020, 06/02/2021   Influenza Inj Mdck Quad With Preservative 05/18/2018   Influenza,inj,Quad PF,6+ Mos 04/18/2017   Influenza-Unspecified 04/01/2014, 04/16/2015, 04/02/2016, 04/02/2016, 05/18/2018   Moderna Sars-Covid-2 Vaccination 08/20/2019, 09/17/2019, 05/27/2020, 11/10/2020   Pfizer Covid-19 Vaccine Bivalent Booster 5y-11y 04/06/2021   Pneumococcal Conjugate-13 11/18/2013   Pneumococcal Polysaccharide-23 08/27/2015   Td 11/18/2013   Tdap 07/17/2016   Zoster Recombinat (Shingrix) 02/15/2017, 05/18/2017   Zoster, Live 05/25/2010    Conditions to be addressed/monitored: Atrial Fibrillation, HTN, and HLD  Care Plan : Medication Management  Updates made by Beryle Lathe, [place] since 07/05/2021 12:00 AM     Problem: HTN, Afib, HLD   Priority: High  Onset Date: 06/21/2021     Long-Range Goal: Disease Progression Prevention   Start Date: 06/21/2021  Expected End Date:  09/19/2021  This Visit's Progress: On track  Priority: High  Note:   Current Barriers:  Unable to independently afford treatment regimen Unable to independently monitor therapeutic efficacy Unable to achieve control of hypertension and hyperlipidemia Suboptimal therapeutic regimen for hyperlipidemia  Pharmacist Clinical Goal(s):  Patient will Achieve adherence to monitoring guidelines and medication adherence to achieve therapeutic efficacy Achieve control of hypertension and hyperlipidemia as evidenced by improved blood pressure control and improved LDL Adhere to plan to optimize therapeutic regimen for hyperlipidemia as evidenced by report of adherence to recommended medication management changes through collaboration with PharmD and provider.   Interventions: 1:1 collaboration with Kathyrn Drown, MD regarding development and update of comprehensive plan of care as evidenced by provider attestation and co-signature Inter-disciplinary care team collaboration (see longitudinal plan of care) Comprehensive medication review performed; medication list updated in electronic medical record  Hypertension - New goal.: Blood pressure under suboptimal control. Blood pressure is above goal of <140/90 mmHg per 2022 AAFP guidelines. Current medications: hydrochlorothiazide 12.5 mg by mouth once daily Intolerances:  amlodipine (swelling/rash) Taking medications as directed: yes Side effects thought to be attributed to current medication regimen: no Denies dizziness and lightheadedness. Current exercise:  does some home exercises but is not able to stay on her feet long due to neuropathy Current home blood pressure: does not check but has blood pressure cuff Encourage dietary sodium restriction/DASH diet Recommend home blood pressure monitoring to discuss at next visit Reviewed risks of hypertension, principles of treatment and consequences of untreated hypertension After next visit, if home BP  elevated, consider switching HCTZ to indapamide for more potent blood pressure lowering (especially at lower renal function). Could also hydralazine given current renal function.  Hyperlipidemia/History of stroke/TIA- New goal.: Uncontrolled. LDL above goal of <70 due to very high risk given established clinical ASCVD per 2020 AACE/ACE guidelines. Triglycerides at goal of <150 per 2020 AACE/ACE guidelines. Current medications:  red yeast rice and fish oil Intolerances:  rosuvastatin took for 8-12 weeks (myalgias) Taking medications as directed: yes Side effects thought to be attributed to  current medication regimen: no Encourage dietary reduction of high fat containing foods such as butter, nuts, bacon, egg yolks, etc. Reviewed risks of hyperlipidemia, principles of treatment and consequences of untreated hyperlipidemia Re-check lipid panel in 4-12 weeks Statin intolerance noted with rosuvastatin. Consider addition of ezetimibe 10 mg by mouth daily to lower LDL to goal. May also need to consider Nexletol. Will discuss with PCP.   Atrial Fibrillation - New goal.: Follows with Dr. Percival Spanish Controlled. Most recent ECG:  sinus rhythm with 1st degree AV block Patient is not currently taking any rate/rhythm control Anticoagulation: rivaroxaban (Xarelto) 15 mg by mouth once daily. Dose appropriate based on renal function and indication.  CHADS2VASc score: 6 - Age (2 points), Female sex (1 point), Hypertension history (1 point), and Stroke/TIA/Thromboembolism history (2 points) Denies signs and symptoms of bleeding but does report bruising easily Current heart rate: 50-70s Continue rivaroxaban (Xarelto) 15 mg by mouth once daily. Patient reports she has enough supply to last through the end of the year. Typically has issues when she goes into the donut hole. Patient instructed to contact [employer] to make sure she has the best Medicare prescription drug coverage she qualifies for in 2023. If patient  reaches donut hole in 2023, can help patient apply for [employer] to lower copay while in donut hole.  Encouraged regular physical activity for general health benefits Discussed need for medication compliance  Patient Goals/Self-Care Activities Patient will:  Take medications as prescribed Check blood pressure at least once daily, document, and provide at future appointments Collaborate with provider on medication access solutions  Follow Up Plan: Telephone follow up appointment with care management team member scheduled for: 08/16/21      Medication Assistance:  see care plan for more details  Patient's preferred pharmacy is:  [place], [place] [address] Phone: [phone] Fax: [phone]  [id number] [place] [address]Phone: [phone]: [phone] [place]Phone: [phone]: [address] [place]Phone: [phone]: [phone] Follow Up:  Patient agrees to Care Plan and Follow-up.  Plan: Telephone follow up appointment with care management team member scheduled for:  08/16/21  CKennon Holter PharmD, BLost Rivers Medical CenterClinical Pharmacist RKindred Hospital IndianapolisPrimary Care [phone]

## 2021-06-21 NOTE — Patient Instructions (Signed)
Becky Baird,  It was great to talk to you today!  Please call me with any questions or concerns.   Visit Information   Following is a copy of your full care plan:  Care Plan : Medication Management  Updates made by Beryle Lathe, [place] since 06/21/2021 12:00 AM     Problem: HTN, Afib, HLD   Priority: High  Onset Date: 06/21/2021     Long-Range Goal: Disease Progression Prevention   Start Date: 06/21/2021  Expected End Date: 09/19/2021  This Visit's Progress: On track  Priority: High  Note:   Current Barriers:  Unable to independently afford treatment regimen Unable to independently monitor therapeutic efficacy Unable to achieve control of hypertension and hyperlipidemia Suboptimal therapeutic regimen for hyperlipidemia  Pharmacist Clinical Goal(s):  Patient will Achieve adherence to monitoring guidelines and medication adherence to achieve therapeutic efficacy Achieve control of hypertension and hyperlipidemia as evidenced by improved blood pressure control and improved LDL Adhere to plan to optimize therapeutic regimen for hyperlipidemia as evidenced by report of adherence to recommended medication management changes through collaboration with PharmD and provider.   Interventions: 1:1 collaboration with Kathyrn Drown, MD regarding development and update of comprehensive plan of care as evidenced by provider attestation and co-signature Inter-disciplinary care team collaboration (see longitudinal plan of care) Comprehensive medication review performed; medication list updated in electronic medical record  Hypertension - New goal.: Blood pressure under poor control. Blood pressure is above goal of <130/80 mmHg per 2017 AHA/ACC guidelines. Current medications: hydrochlorothiazide 12.5 mg by mouth once daily Intolerances:  amlodipine (swelling/rash) Taking medications as directed: yes Side effects thought to be attributed to current medication regimen: no Denies  dizziness and lightheadedness. Current exercise:  does some home exercises but is not able to stay on her feet long due to neuropathy Current home blood pressure: does not check but has blood pressure cuff Encourage dietary sodium restriction/DASH diet Recommend home blood pressure monitoring to discuss at next visit Reviewed risks of hypertension, principles of treatment and consequences of untreated hypertension After next visit, if home BP elevated, consider switching to indapamide or chlorthalidone instead of HCTZ for more potent blood pressure lowering (especially at lower renal function). Could also hydralazine given current renal function. Will discuss options with PCP.  Hyperlipidemia/History of stroke/TIA- New goal.: Uncontrolled. LDL above goal of <70 due to very high risk given established clinical ASCVD per 2020 AACE/ACE guidelines. Triglycerides at goal of <150 per 2020 AACE/ACE guidelines. Current medications:  red yeast rice and fish oil Intolerances:  rosuvastatin took for 8-12 weeks (myalgias) Taking medications as directed: yes Side effects thought to be attributed to current medication regimen: no Encourage dietary reduction of high fat containing foods such as butter, nuts, bacon, egg yolks, etc. Reviewed risks of hyperlipidemia, principles of treatment and consequences of untreated hyperlipidemia Re-check lipid panel in 4-12 weeks Statin intolerance noted with rosuvastatin. Consider addition of ezetimibe 10 mg by mouth daily to lower LDL to goal. May also need to consider Nexletol. Will discuss with PCP.   Atrial Fibrillation - New goal.: Follows with Dr. Percival Spanish Controlled. Most recent ECG:  sinus rhythm with 1st degree AV block Patient is not currently taking any rate/rhythm control Anticoagulation: rivaroxaban (Xarelto) 15 mg by mouth once daily. Dose appropriate based on renal function and indication.  CHADS2VASc score: 6 - Age (2 points), Female sex (1 point),  Hypertension history (1 point), and Stroke/TIA/Thromboembolism history (2 points) Denies signs and symptoms of bleeding but does report  bruising easily Current heart rate: 50-70s Continue rivaroxaban (Xarelto) 15 mg by mouth once daily. Patient reports she has enough supply to last through the end of the year. Typically has issues when she goes into the donut hole. Patient instructed to contact [facility] to make sure she has the best Medicare prescription drug coverage she qualifies for in 2023. If patient reaches donut hole in 2023, can help patient apply for [employer] to lower copay while in donut hole.  Encouraged regular physical activity for general health benefits Discussed need for medication compliance  Patient Goals/Self-Care Activities Patient will:  Take medications as prescribed Check blood pressure at least once daily, document, and provide at future appointments Collaborate with provider on medication access solutions  Follow Up Plan: Telephone follow up appointment with care management team member scheduled for: 08/16/21      Consent to CCM Services: Ms. Norrod was given information about Chronic Care Management services including:  CCM service includes personalized support from designated clinical staff supervised by her physician, including individualized plan of care and coordination with other care providers 24/7 contact phone numbers for assistance for urgent and routine care needs. Service will only be billed when office clinical staff spend 20 minutes or more in a month to coordinate care. Only one practitioner may furnish and bill the service in a calendar month. The patient may stop CCM services at any time (effective at the end of the month) by phone call to the office staff. The patient will be responsible for cost sharing (co-pay) of up to 20% of the service fee (after annual deductible is met).  Patient agreed to services and verbal consent obtained.   Plan:  Telephone follow up appointment with care management team member scheduled for:  08/16/21  Kennon Holter, PharmD, BCACP Clinical Pharmacist [place] [phone]   Please call the care guide team at [phone] if you need to cancel or reschedule your appointment.   Patient verbalizes understanding of instructions provided today and agrees to view in [place].     It can be hard choosing a Medicare plan. A group that I always recommend for my patients is called [facility]). With this program, they offer free counseling to Medicare beneficiaries and caregivers about Medicare, Medicare supplements, Medicare Advantage, Medicare Part D, and long-term care insurance. [place] counselors are not [employer], and they do not sell or endorse any product, plan, or company. The counselors are volunteers and they always offer unbiased information regarding Medicare health care products.  [facility] has counselors in every county across the state who are trained to be the [place] people for seniors and Medicare beneficiaries in their local communities. Local counseling is done by appointment in each county.   The [facility] local counseling team's information can be found below. I recommend that you give them a call and set up an appointment (Ask for [place]).    [facility]     [address] [place]  [address] [phone]   For more information: SatelliteSeeker.no or just google "[place] [facility]"

## 2021-06-23 DIAGNOSIS — I480 Paroxysmal atrial fibrillation: Secondary | ICD-10-CM

## 2021-06-23 DIAGNOSIS — I10 Essential (primary) hypertension: Secondary | ICD-10-CM

## 2021-06-23 DIAGNOSIS — E7849 Other hyperlipidemia: Secondary | ICD-10-CM

## 2021-07-07 DIAGNOSIS — M79675 Pain in left toe(s): Secondary | ICD-10-CM | POA: Diagnosis not present

## 2021-07-07 DIAGNOSIS — M79672 Pain in left foot: Secondary | ICD-10-CM | POA: Diagnosis not present

## 2021-07-07 DIAGNOSIS — M79671 Pain in right foot: Secondary | ICD-10-CM | POA: Diagnosis not present

## 2021-07-07 DIAGNOSIS — L110 Acquired keratosis follicularis: Secondary | ICD-10-CM | POA: Diagnosis not present

## 2021-07-07 DIAGNOSIS — M79674 Pain in right toe(s): Secondary | ICD-10-CM | POA: Diagnosis not present

## 2021-07-07 DIAGNOSIS — I739 Peripheral vascular disease, unspecified: Secondary | ICD-10-CM | POA: Diagnosis not present

## 2021-07-20 ENCOUNTER — Other Ambulatory Visit: Payer: Self-pay | Admitting: Cardiology

## 2021-07-20 NOTE — Telephone Encounter (Signed)
Prescription refill request for Xarelto received.  Indication: atrial fibrillation Last office visit: 5/22 [facility] Weight: 65.3 kg Age: 87 Scr: 1.3 CrCl: 33

## 2021-07-22 ENCOUNTER — Ambulatory Visit (HOSPITAL_COMMUNITY): Payer: Medicare Other

## 2021-08-13 ENCOUNTER — Other Ambulatory Visit: Payer: Self-pay | Admitting: Family Medicine

## 2021-08-16 ENCOUNTER — Telehealth: Payer: Medicare Other

## 2021-08-19 ENCOUNTER — Other Ambulatory Visit: Payer: Self-pay

## 2021-08-19 ENCOUNTER — Ambulatory Visit (HOSPITAL_COMMUNITY)
Admission: RE | Admit: 2021-08-19 | Discharge: 2021-08-19 | Disposition: A | Discharge status: Home / Self Care | Payer: Medicare Other | Source: Ambulatory Visit | Attending: Cardiology | Admitting: Cardiology

## 2021-08-19 DIAGNOSIS — R300 Dysuria: Secondary | ICD-10-CM | POA: Diagnosis not present

## 2021-08-19 DIAGNOSIS — I350 Nonrheumatic aortic (valve) stenosis: Secondary | ICD-10-CM | POA: Diagnosis not present

## 2021-08-19 LAB — ECHOCARDIOGRAM COMPLETE
AR max vel: 1.2 cm2
AV Area VTI: 1.21 cm2
AV Area mean vel: 1.24 cm2
AV Mean grad: 22 mmHg
AV Peak grad: 33.9 mmHg
Ao pk vel: 2.91 m/s
Area-P 1/2: 2.95 cm2
MV M vel: 5.79 m/s
MV Peak grad: 134.1 mmHg
P 1/2 time: 410 msec
S' Lateral: 2.3 cm

## 2021-08-19 NOTE — Progress Notes (Signed)
*  PRELIMINARY RESULTS* Echocardiogram 2D Echocardiogram has been performed.  Samuel Germany 08/19/2021, 3:59 PM

## 2021-08-24 DIAGNOSIS — Z20822 Contact with and (suspected) exposure to covid-19: Secondary | ICD-10-CM | POA: Diagnosis not present

## 2021-08-24 DIAGNOSIS — Z8673 Personal history of transient ischemic attack (TIA), and cerebral infarction without residual deficits: Secondary | ICD-10-CM | POA: Diagnosis not present

## 2021-08-24 DIAGNOSIS — R001 Bradycardia, unspecified: Secondary | ICD-10-CM | POA: Diagnosis not present

## 2021-08-24 DIAGNOSIS — I10 Essential (primary) hypertension: Secondary | ICD-10-CM | POA: Diagnosis not present

## 2021-08-24 DIAGNOSIS — R531 Weakness: Secondary | ICD-10-CM | POA: Diagnosis not present

## 2021-08-24 DIAGNOSIS — R9431 Abnormal electrocardiogram [ECG] [EKG]: Secondary | ICD-10-CM | POA: Diagnosis not present

## 2021-08-24 DIAGNOSIS — I6381 Other cerebral infarction due to occlusion or stenosis of small artery: Secondary | ICD-10-CM | POA: Diagnosis not present

## 2021-08-24 DIAGNOSIS — Z79899 Other long term (current) drug therapy: Secondary | ICD-10-CM | POA: Diagnosis not present

## 2021-08-24 DIAGNOSIS — R410 Disorientation, unspecified: Secondary | ICD-10-CM | POA: Diagnosis not present

## 2021-08-24 DIAGNOSIS — Z7901 Long term (current) use of anticoagulants: Secondary | ICD-10-CM | POA: Diagnosis not present

## 2021-08-24 DIAGNOSIS — G459 Transient cerebral ischemic attack, unspecified: Secondary | ICD-10-CM | POA: Diagnosis not present

## 2021-08-24 DIAGNOSIS — I447 Left bundle-branch block, unspecified: Secondary | ICD-10-CM | POA: Diagnosis not present

## 2021-08-24 DIAGNOSIS — R29810 Facial weakness: Secondary | ICD-10-CM | POA: Diagnosis not present

## 2021-08-24 DIAGNOSIS — H538 Other visual disturbances: Secondary | ICD-10-CM | POA: Diagnosis not present

## 2021-08-24 DIAGNOSIS — I480 Paroxysmal atrial fibrillation: Secondary | ICD-10-CM | POA: Diagnosis not present

## 2021-08-24 DIAGNOSIS — N390 Urinary tract infection, site not specified: Secondary | ICD-10-CM | POA: Diagnosis not present

## 2021-08-24 DIAGNOSIS — I440 Atrioventricular block, first degree: Secondary | ICD-10-CM | POA: Diagnosis not present

## 2021-09-02 ENCOUNTER — Ambulatory Visit: Payer: Medicare Other | Admitting: Family Medicine

## 2021-09-03 ENCOUNTER — Ambulatory Visit (INDEPENDENT_AMBULATORY_CARE_PROVIDER_SITE_OTHER): Payer: Medicare Other | Admitting: Family Medicine

## 2021-09-03 ENCOUNTER — Telehealth: Payer: Self-pay | Admitting: Family Medicine

## 2021-09-03 ENCOUNTER — Other Ambulatory Visit: Payer: Self-pay

## 2021-09-03 VITALS — BP 158/82 | Temp 98.1°F | Ht 64.0 in | Wt 142.0 lb

## 2021-09-03 DIAGNOSIS — E7849 Other hyperlipidemia: Secondary | ICD-10-CM | POA: Diagnosis not present

## 2021-09-03 DIAGNOSIS — I480 Paroxysmal atrial fibrillation: Secondary | ICD-10-CM

## 2021-09-03 DIAGNOSIS — I700 Atherosclerosis of aorta: Secondary | ICD-10-CM | POA: Diagnosis not present

## 2021-09-03 DIAGNOSIS — R7989 Other specified abnormal findings of blood chemistry: Secondary | ICD-10-CM

## 2021-09-03 DIAGNOSIS — I5189 Other ill-defined heart diseases: Secondary | ICD-10-CM | POA: Diagnosis not present

## 2021-09-03 DIAGNOSIS — I10 Essential (primary) hypertension: Secondary | ICD-10-CM | POA: Diagnosis not present

## 2021-09-03 DIAGNOSIS — R7303 Prediabetes: Secondary | ICD-10-CM

## 2021-09-03 MED ORDER — AMLODIPINE BESYLATE 2.5 MG PO TABS: 2.5000 mg | ORAL_TABLET | Freq: Every day | ORAL | 3 refills | Status: DC

## 2021-09-03 NOTE — Telephone Encounter (Signed)
Pt can be scheduled on 09/24/21. Please schedule and call patient. Thank you!!

## 2021-09-03 NOTE — Telephone Encounter (Signed)
Patient instructed by provider to return 2 to 3 weeks. Provider's schedule changing in March - please advise for scheduling at [phone] (daughter [name]).

## 2021-09-03 NOTE — Progress Notes (Signed)
° °  Subjective:    Patient ID: Becky Baird, female    DOB: Dec 05, 1933, 88 y.o.   MRN: [id number]  HPI 3 month follow up HTN and recheck neuropathy  ER visit on 08/24/21 and completing abx for uti Can mag level affect neuropathy Long discussion held regarding her ER visit, TIA Review of Systems     Objective:   Physical Exam General-in no acute distress Eyes-no discharge Lungs-respiratory rate normal, CTA CV-no murmurs,RRR Extremities skin warm dry no edema Neuro grossly normal Behavior normal, alert Frail to some degree with ability to walk but slow Increased risk of falls Utilize cane       Assessment & Plan:  1. Elevated serum creatinine Her creatinine significantly elevated when she was in the ER we need to recheck this to see if there is been a decline - Basic metabolic panel - Vitamin D (25 hydroxy) - Magnesium - Lipid panel - Hemoglobin A1c  2. Primary hypertension Blood pressure significant elevation of systolic need to be careful with diuretics because of creatinine does not tolerate other medicines well more than likely will have side effects of pedal edema with amlodipine but amlodipine would be the best choice for the systolic hypertension.  I certainly tried to paint a realistic picture of keeping blood pressure under control even if it means some mild side effects - Basic metabolic panel - Vitamin D (25 hydroxy) - Magnesium - Lipid panel - Hemoglobin A1c  3. Hypocalcemia Calcium was low at the lab at the ER therefore check magnesium and vitamin D and calcium again - Basic metabolic panel - Vitamin D (25 hydroxy) - Magnesium - Lipid panel - Hemoglobin A1c  4. Prediabetes Check A1c watch starches in diet previous A1c fair - Basic metabolic panel - Vitamin D (25 hydroxy) - Magnesium - Lipid panel - Hemoglobin A1c  5. Other hyperlipidemia Consideration for Repatha or Praluent will discuss various options with the patient follows up currently she  is on red yeast rice extract which has very little overall benefits - Basic metabolic panel - Vitamin D (25 hydroxy) - Magnesium - Lipid panel - Hemoglobin A1c  6. Paroxysmal atrial fibrillation (HCC) On anticoagulant.  7. Diastolic dysfunction This was seen on echo.  No overt failure.  Aortic atherosclerosis seen on scan  TIA with previous mini strokes on MRI do the best we can at managing risk factors to lessen the risk of further strokes  Follow-up in 2 weeks to discuss various options regarding cholesterol at that follow-up

## 2021-09-04 LAB — BASIC METABOLIC PANEL
BUN/Creatinine Ratio: 20 (ref 12–28)
BUN: 30 mg/dL — ABNORMAL HIGH (ref 8–27)
CO2: 23 mmol/L (ref 20–29)
Calcium: 10.4 mg/dL — ABNORMAL HIGH (ref 8.7–10.3)
Chloride: 101 mmol/L (ref 96–106)
Creatinine, Ser: 1.49 mg/dL — ABNORMAL HIGH (ref 0.57–1.00)
Glucose: 115 mg/dL — ABNORMAL HIGH (ref 70–99)
Potassium: 4.2 mmol/L (ref 3.5–5.2)
Sodium: 139 mmol/L (ref 134–144)
eGFR: 34 mL/min/{1.73_m2} — ABNORMAL LOW (ref >59)

## 2021-09-04 LAB — LIPID PANEL
Chol/HDL Ratio: 2.8 ratio (ref 0.0–4.4)
Cholesterol, Total: 177 mg/dL (ref 100–199)
HDL: 63 mg/dL (ref >39)
LDL Chol Calc (NIH): 96 mg/dL (ref 0–99)
Triglycerides: 97 mg/dL (ref 0–149)
VLDL Cholesterol Cal: 18 mg/dL (ref 5–40)

## 2021-09-04 LAB — MAGNESIUM: Magnesium: 2.2 mg/dL (ref 1.6–2.3)

## 2021-09-04 LAB — HEMOGLOBIN A1C
Est. average glucose Bld gHb Est-mCnc: 126 mg/dL
Hgb A1c MFr Bld: 6 % — ABNORMAL HIGH (ref 4.8–5.6)

## 2021-09-04 LAB — VITAMIN D 25 HYDROXY (VIT D DEFICIENCY, FRACTURES): Vit D, 25-Hydroxy: 40.9 ng/mL (ref 30.0–100.0)

## 2021-09-05 ENCOUNTER — Encounter: Payer: Self-pay | Admitting: Family Medicine

## 2021-09-06 ENCOUNTER — Encounter: Payer: Self-pay | Admitting: Family Medicine

## 2021-09-06 ENCOUNTER — Telehealth: Payer: Self-pay | Admitting: *Deleted

## 2021-09-06 NOTE — Chronic Care Management (AMB) (Signed)
°  Care Management   Note  09/06/2021 Name: CARRIGAN DELAFUENTE MRN: [id number] DOB: 1934-05-31  SHATASIA CUTSHAW is a 88 y.o. year old female who is a primary care patient of Luking, Elayne Snare, MD and is actively engaged with the care management team. I reached out to [name] by phone today to assist with re-scheduling a follow up visit with the Pharmacist  Follow up plan: A telephone outreach attempt made. The care management team will reach out to the patient again over the next 7 days.  If patient returns call to provider office, please advise to call [place] at [phone].  [place] Management  Direct Dial: [phone]

## 2021-09-07 NOTE — Telephone Encounter (Signed)
Follow up appt already scheduled for 3/8.

## 2021-09-08 ENCOUNTER — Encounter: Payer: Self-pay | Admitting: Family Medicine

## 2021-09-08 DIAGNOSIS — G8929 Other chronic pain: Secondary | ICD-10-CM | POA: Diagnosis not present

## 2021-09-08 DIAGNOSIS — R404 Transient alteration of awareness: Secondary | ICD-10-CM | POA: Diagnosis not present

## 2021-09-08 DIAGNOSIS — Z79899 Other long term (current) drug therapy: Secondary | ICD-10-CM | POA: Diagnosis not present

## 2021-09-08 DIAGNOSIS — Z8673 Personal history of transient ischemic attack (TIA), and cerebral infarction without residual deficits: Secondary | ICD-10-CM | POA: Diagnosis not present

## 2021-09-08 DIAGNOSIS — R3915 Urgency of urination: Secondary | ICD-10-CM | POA: Diagnosis not present

## 2021-09-08 DIAGNOSIS — I6381 Other cerebral infarction due to occlusion or stenosis of small artery: Secondary | ICD-10-CM | POA: Diagnosis not present

## 2021-09-08 DIAGNOSIS — R4182 Altered mental status, unspecified: Secondary | ICD-10-CM | POA: Diagnosis not present

## 2021-09-08 DIAGNOSIS — R9431 Abnormal electrocardiogram [ECG] [EKG]: Secondary | ICD-10-CM | POA: Diagnosis not present

## 2021-09-08 DIAGNOSIS — G459 Transient cerebral ischemic attack, unspecified: Secondary | ICD-10-CM | POA: Diagnosis not present

## 2021-09-08 DIAGNOSIS — I447 Left bundle-branch block, unspecified: Secondary | ICD-10-CM | POA: Diagnosis not present

## 2021-09-08 DIAGNOSIS — E860 Dehydration: Secondary | ICD-10-CM | POA: Diagnosis not present

## 2021-09-08 DIAGNOSIS — I10 Essential (primary) hypertension: Secondary | ICD-10-CM | POA: Diagnosis not present

## 2021-09-08 DIAGNOSIS — R001 Bradycardia, unspecified: Secondary | ICD-10-CM | POA: Diagnosis not present

## 2021-09-08 DIAGNOSIS — I6789 Other cerebrovascular disease: Secondary | ICD-10-CM | POA: Diagnosis not present

## 2021-09-08 DIAGNOSIS — R410 Disorientation, unspecified: Secondary | ICD-10-CM | POA: Diagnosis not present

## 2021-09-08 DIAGNOSIS — R059 Cough, unspecified: Secondary | ICD-10-CM | POA: Diagnosis not present

## 2021-09-08 DIAGNOSIS — R531 Weakness: Secondary | ICD-10-CM | POA: Diagnosis not present

## 2021-09-08 DIAGNOSIS — R000 Tachycardia, unspecified: Secondary | ICD-10-CM | POA: Diagnosis not present

## 2021-09-08 DIAGNOSIS — K589 Irritable bowel syndrome without diarrhea: Secondary | ICD-10-CM | POA: Diagnosis not present

## 2021-09-08 NOTE — Telephone Encounter (Signed)
Nurses If possible I like to see the patient on Friday either at 11:40 AM or at 330 or at the end of Friday afternoon Obviously please check schedule to make sure both or one of the slots are open before scheduling her thank you

## 2021-09-09 ENCOUNTER — Encounter: Payer: Self-pay | Admitting: Family Medicine

## 2021-09-10 ENCOUNTER — Other Ambulatory Visit: Payer: Self-pay

## 2021-09-10 ENCOUNTER — Ambulatory Visit (INDEPENDENT_AMBULATORY_CARE_PROVIDER_SITE_OTHER): Payer: Medicare Other | Admitting: Family Medicine

## 2021-09-10 VITALS — BP 168/62 | HR 59 | Temp 97.3°F | Ht 64.0 in | Wt 143.0 lb

## 2021-09-10 DIAGNOSIS — N3000 Acute cystitis without hematuria: Secondary | ICD-10-CM | POA: Diagnosis not present

## 2021-09-10 DIAGNOSIS — I10 Essential (primary) hypertension: Secondary | ICD-10-CM

## 2021-09-10 DIAGNOSIS — R4182 Altered mental status, unspecified: Secondary | ICD-10-CM | POA: Diagnosis not present

## 2021-09-10 LAB — POCT URINALYSIS DIPSTICK
Bilirubin, UA: NEGATIVE
Glucose, UA: NEGATIVE
Ketones, UA: NEGATIVE
Nitrite, UA: NEGATIVE
Protein, UA: NEGATIVE
Spec Grav, UA: 1.01 (ref 1.010–1.025)
Urobilinogen, UA: NEGATIVE E.U./dL — AB
pH, UA: 5 (ref 5.0–8.0)

## 2021-09-10 MED ORDER — CEPHALEXIN 500 MG PO CAPS: ORAL_CAPSULE | ORAL | 0 refills | Status: DC

## 2021-09-10 NOTE — Progress Notes (Signed)
° °  Subjective:    Patient ID: Becky Baird, female    DOB: September 18, 1933, 88 y.o.   MRN: [id number]  HPI  Patient was seen at [facility] ER for confusion and couldn't get to moving. Patient stayed in the ER. Patient's blood pressure was elevated when at the hospital.  I reviewed over her records from both visits to the ER Reviewed over her MRA and MRI Does not appear she has had a stroke Blood pressure was elevated Patient gets very stressed about this Difficult to control blood pressure Sensitive to medications Denies any chest tightness or shortness of breath Reviewed labs images and ER notes with family and patient  Review of Systems     Objective:   Physical Exam  Lungs clear heart regular pulse normal blood pressure on multiple rechecks elevated      Assessment & Plan:  Blood pressure elevated Continue current measures Recheck blood pressure next week If still elevated would bump up dose of amlodipine 1 makes it difficult as her diastolic stays in the low range puts her at risk for falls if too aggressive of treatment UTI Keflex 3 times daily for 5 days culture urine  We did discuss how there could be increased risk of stroke with all of this but slow approach would be best we did discuss consultation with neurology but did not feel they would be doing anything different than what we are doing currently

## 2021-09-13 NOTE — Chronic Care Management (AMB) (Signed)
°  Care Management   Note  09/13/2021 Name: [name] MRN: [id number] DOB: 03/06/1934  Becky Baird is a 88 y.o. year old female who is a primary care patient of Luking, Elayne Snare, MD and is actively engaged with the care management team. I reached out to [name] by phone today to assist with re-scheduling a follow up visit with the Pharmacist  Follow up plan: Unsuccessful telephone outreach attempt made. A HIPAA compliant phone message was left for the patient providing contact information and requesting a return call.  The care management team will reach out to the patient again over the next 7 days.  If patient returns call to provider office, please advise to call [place]  at [phone].  [place] Management  Direct Dial: [phone]

## 2021-09-14 NOTE — Chronic Care Management (AMB) (Signed)
°  Care Management   Note  09/14/2021 Name: Becky Baird MRN: [id number] DOB: 1933/12/22  Becky Baird is a 88 y.o. year old female who is a primary care patient of Luking, Elayne Snare, MD and is actively engaged with the care management team. I reached out to [name] by phone today to assist with re-scheduling a follow up visit with the Pharmacist  Follow up plan: Patient declines further follow up and engagement by the care management team. Appropriate care team members and provider have been notified via electronic communication.  The care management team is available to follow up with the patient after provider conversation with the patient regarding recommendation for care management engagement and subsequent re-referral to the care management team.   [place] Management  Direct Dial: [phone]

## 2021-09-15 LAB — URINE CULTURE

## 2021-09-15 LAB — SPECIMEN STATUS REPORT

## 2021-09-17 ENCOUNTER — Ambulatory Visit (INDEPENDENT_AMBULATORY_CARE_PROVIDER_SITE_OTHER): Payer: Medicare Other | Admitting: Family Medicine

## 2021-09-17 ENCOUNTER — Other Ambulatory Visit: Payer: Self-pay

## 2021-09-17 DIAGNOSIS — I10 Essential (primary) hypertension: Secondary | ICD-10-CM

## 2021-09-17 DIAGNOSIS — R600 Localized edema: Secondary | ICD-10-CM | POA: Diagnosis not present

## 2021-09-17 DIAGNOSIS — D692 Other nonthrombocytopenic purpura: Secondary | ICD-10-CM | POA: Diagnosis not present

## 2021-09-17 DIAGNOSIS — R0609 Other forms of dyspnea: Secondary | ICD-10-CM | POA: Diagnosis not present

## 2021-09-17 MED ORDER — HYDROCHLOROTHIAZIDE 25 MG PO TABS: 25.0000 mg | ORAL_TABLET | Freq: Every day | ORAL | 1 refills | Status: DC

## 2021-09-17 NOTE — Progress Notes (Signed)
° °  Subjective:    Patient ID: Becky Baird, female    DOB: 10-19-1933, 88 y.o.   MRN: [id number]  HPI Pt here to follow up on HTN. Pt daughter states that they have decided to not take BP because it brings on more anxiety. Urine issues have improved; pt still having urgency. Pt feet swollen Patient tolerating things fairly well but has noticed severe swelling since starting amlodipine  Review of Systems     Objective:   Physical Exam Lungs clear hearts regular pulse normal severe pedal edema noted with bruising       Assessment & Plan:  Significant pedal edema along with bruising in her feet and purpura Will need to stop amlodipine because of this  Blood pressure moderately elevated  Recheck again in couple weeks time  Bump up dose of HCTZ to 25 mg Follow creatinine closely  Stop amlodipine Doubt CHF Check lab work await results

## 2021-09-18 LAB — BRAIN NATRIURETIC PEPTIDE: BNP: 185.4 pg/mL — ABNORMAL HIGH (ref 0.0–100.0)

## 2021-09-18 LAB — CBC WITH DIFFERENTIAL/PLATELET
Basophils Absolute: 0 10*3/uL (ref 0.0–0.2)
Basos: 1 %
EOS (ABSOLUTE): 0.1 10*3/uL (ref 0.0–0.4)
Eos: 1 %
Hematocrit: 35.9 % (ref 34.0–46.6)
Hemoglobin: 12.2 g/dL (ref 11.1–15.9)
Immature Grans (Abs): 0 10*3/uL (ref 0.0–0.1)
Immature Granulocytes: 0 %
Lymphocytes Absolute: 1.8 10*3/uL (ref 0.7–3.1)
Lymphs: 26 %
MCH: 30.9 pg (ref 26.6–33.0)
MCHC: 34 g/dL (ref 31.5–35.7)
MCV: 91 fL (ref 79–97)
Monocytes Absolute: 0.8 10*3/uL (ref 0.1–0.9)
Monocytes: 12 %
Neutrophils Absolute: 4.2 10*3/uL (ref 1.4–7.0)
Neutrophils: 60 %
Platelets: 276 10*3/uL (ref 150–450)
RBC: 3.95 x10E6/uL (ref 3.77–5.28)
RDW: 12.4 % (ref 11.7–15.4)
WBC: 6.9 10*3/uL (ref 3.4–10.8)

## 2021-09-18 LAB — BASIC METABOLIC PANEL
BUN/Creatinine Ratio: 21 (ref 12–28)
BUN: 23 mg/dL (ref 8–27)
CO2: 21 mmol/L (ref 20–29)
Calcium: 10.2 mg/dL (ref 8.7–10.3)
Chloride: 95 mmol/L — ABNORMAL LOW (ref 96–106)
Creatinine, Ser: 1.08 mg/dL — ABNORMAL HIGH (ref 0.57–1.00)
Glucose: 94 mg/dL (ref 70–99)
Potassium: 4.5 mmol/L (ref 3.5–5.2)
Sodium: 134 mmol/L (ref 134–144)
eGFR: 50 mL/min/{1.73_m2} — ABNORMAL LOW (ref >59)

## 2021-09-18 LAB — PROTIME-INR
INR: 1 (ref 0.9–1.2)
Prothrombin Time: 10.9 s (ref 9.1–12.0)

## 2021-09-19 ENCOUNTER — Encounter: Payer: Self-pay | Admitting: Family Medicine

## 2021-09-22 DIAGNOSIS — I739 Peripheral vascular disease, unspecified: Secondary | ICD-10-CM | POA: Diagnosis not present

## 2021-09-22 DIAGNOSIS — M79675 Pain in left toe(s): Secondary | ICD-10-CM | POA: Diagnosis not present

## 2021-09-22 DIAGNOSIS — M79674 Pain in right toe(s): Secondary | ICD-10-CM | POA: Diagnosis not present

## 2021-09-22 DIAGNOSIS — M79671 Pain in right foot: Secondary | ICD-10-CM | POA: Diagnosis not present

## 2021-09-22 DIAGNOSIS — L110 Acquired keratosis follicularis: Secondary | ICD-10-CM | POA: Diagnosis not present

## 2021-09-22 DIAGNOSIS — M79672 Pain in left foot: Secondary | ICD-10-CM | POA: Diagnosis not present

## 2021-09-23 ENCOUNTER — Encounter: Payer: Self-pay | Admitting: Family Medicine

## 2021-09-23 DIAGNOSIS — R197 Diarrhea, unspecified: Secondary | ICD-10-CM

## 2021-09-24 DIAGNOSIS — I10 Essential (primary) hypertension: Secondary | ICD-10-CM | POA: Diagnosis not present

## 2021-09-24 DIAGNOSIS — Z888 Allergy status to other drugs, medicaments and biological substances status: Secondary | ICD-10-CM | POA: Diagnosis not present

## 2021-09-24 DIAGNOSIS — E860 Dehydration: Secondary | ICD-10-CM | POA: Diagnosis not present

## 2021-09-24 DIAGNOSIS — A0472 Enterocolitis due to Clostridium difficile, not specified as recurrent: Secondary | ICD-10-CM | POA: Diagnosis not present

## 2021-09-24 DIAGNOSIS — R197 Diarrhea, unspecified: Secondary | ICD-10-CM | POA: Diagnosis not present

## 2021-09-24 DIAGNOSIS — I959 Hypotension, unspecified: Secondary | ICD-10-CM | POA: Diagnosis not present

## 2021-09-24 NOTE — Telephone Encounter (Signed)
Nurses ?With this I would recommend stool testing for stool culture, also stool testing for C. difficile toxin with reflex, also recommend office visit next week.  It is quite possible that this office visit will need to be with the nurse practitioner who would be able to consult with me while she is present thanks ? ?If her condition gets worse over the weekend she may need to go to the emergency department.  Such as dehydration fevers bloody stools. ?

## 2021-09-24 NOTE — Telephone Encounter (Signed)
Stool test ordered and discussed with daughter [facility]) Patient has appointment scheduled with Dr Nicki Reaper 09/29/21 at 1:10 pm for follow up ?

## 2021-09-27 ENCOUNTER — Encounter: Payer: Self-pay | Admitting: Family Medicine

## 2021-09-27 NOTE — Telephone Encounter (Signed)
Nurses ?I reviewed over the message ?Also reviewed over her notes from [facility] ER and lab work. ?I would recommend this week metabolic 7 along with lipase and liver enzymes.   ?What is most important is whether or not she is having severe abdominal pain or discomfort if she is I would like to have that lab done as soon as possible through the hospital lab staff ?If she seems to be improving we could do this lab work when she comes on the eighth in 2 days ?

## 2021-09-27 NOTE — Telephone Encounter (Signed)
Daughter (DPR) stated that the patient was Becky Baird this am and better this afternoon- not reporting severe pain- they will wait and watch and see how the patient does and call back if worse or go back to ER. Patient will keep appt for follow up on 09/29/21 ?

## 2021-09-28 ENCOUNTER — Ambulatory Visit: Payer: Medicare Other | Admitting: Family Medicine

## 2021-09-28 DIAGNOSIS — N8110 Cystocele, unspecified: Secondary | ICD-10-CM | POA: Diagnosis not present

## 2021-09-28 DIAGNOSIS — Z87440 Personal history of urinary (tract) infections: Secondary | ICD-10-CM | POA: Diagnosis not present

## 2021-09-28 DIAGNOSIS — L900 Lichen sclerosus et atrophicus: Secondary | ICD-10-CM | POA: Diagnosis not present

## 2021-09-28 DIAGNOSIS — Z4689 Encounter for fitting and adjustment of other specified devices: Secondary | ICD-10-CM | POA: Diagnosis not present

## 2021-09-29 ENCOUNTER — Other Ambulatory Visit: Payer: Self-pay

## 2021-09-29 ENCOUNTER — Encounter: Payer: Self-pay | Admitting: Family Medicine

## 2021-09-29 ENCOUNTER — Ambulatory Visit (INDEPENDENT_AMBULATORY_CARE_PROVIDER_SITE_OTHER): Payer: Medicare Other | Admitting: Family Medicine

## 2021-09-29 VITALS — BP 136/68 | HR 84 | Temp 97.5°F | Wt 140.0 lb

## 2021-09-29 DIAGNOSIS — I10 Essential (primary) hypertension: Secondary | ICD-10-CM | POA: Diagnosis not present

## 2021-09-29 DIAGNOSIS — R1084 Generalized abdominal pain: Secondary | ICD-10-CM

## 2021-09-29 DIAGNOSIS — R197 Diarrhea, unspecified: Secondary | ICD-10-CM

## 2021-09-29 DIAGNOSIS — R748 Abnormal levels of other serum enzymes: Secondary | ICD-10-CM | POA: Diagnosis not present

## 2021-09-29 DIAGNOSIS — E7849 Other hyperlipidemia: Secondary | ICD-10-CM | POA: Diagnosis not present

## 2021-09-29 DIAGNOSIS — R7989 Other specified abnormal findings of blood chemistry: Secondary | ICD-10-CM | POA: Diagnosis not present

## 2021-09-29 MED ORDER — NEXLETOL 180 MG PO TABS: ORAL_TABLET | ORAL | 3 refills | Status: DC

## 2021-09-29 NOTE — Progress Notes (Signed)
? ?  Subjective:  ? ? Patient ID: Becky Baird, female    DOB: 11-Jan-1934, 88 y.o.   MRN: [id number] ? ?HPI ? ?Patient here for 3 week follow up. Blood pressure is near what doctor wants. ?Patient also wants to know what she can take in place of the red yeast rice. ?Lipase was elevated.  ?We did discuss healthy eating and also discussed she is quite limited in her activity ?Recent blood work did show elevated lipase it is possible this could be a spurious result or related to gastroenterologic issues ?Review of Systems ? ?   ?Objective:  ? Physical Exam ?General-in no acute distress ?Eyes-no discharge ?Lungs-respiratory rate normal, CTA ?CV-no murmurs,RRR ?Extremities skin warm dry no edema ?Neuro grossly normal ?Behavior normal, alert ?No edema today ? ? ? ?   ?Assessment & Plan:  ?1. Primary hypertension ?BP great ?- Basic Metabolic Panel (BMET) ?- Lipase ?- Hepatic function panel ? ?2. Diarrhea, unspecified type ?Resolved if reoccurs then retest for C Diff ?Use probiotics ?- Basic Metabolic Panel (BMET) ?- Lipase ?- Hepatic function panel ? ?3. Generalized abdominal pain ?Check labs ?Pain is resolved ?If Lipase is still up then needs further workup ?- Basic Metabolic Panel (BMET) ?- Lipase ?- Hepatic function panel ? ?4. Elevated lipase ?See above ?- Basic Metabolic Panel (BMET) ?- Lipase ?- Hepatic function panel ? ?Recheck in 6 to 8 weeks ?

## 2021-10-01 LAB — HEPATIC FUNCTION PANEL
ALT: 12 IU/L (ref 0–32)
AST: 16 IU/L (ref 0–40)
Albumin: 4.4 g/dL (ref 3.6–4.6)
Alkaline Phosphatase: 82 IU/L (ref 44–121)
Bilirubin Total: 0.4 mg/dL (ref 0.0–1.2)
Bilirubin, Direct: 0.11 mg/dL (ref 0.00–0.40)
Total Protein: 6.9 g/dL (ref 6.0–8.5)

## 2021-10-01 LAB — BASIC METABOLIC PANEL
BUN/Creatinine Ratio: 17 (ref 12–28)
BUN: 22 mg/dL (ref 8–27)
CO2: 23 mmol/L (ref 20–29)
Calcium: 10.1 mg/dL (ref 8.7–10.3)
Chloride: 102 mmol/L (ref 96–106)
Creatinine, Ser: 1.26 mg/dL — ABNORMAL HIGH (ref 0.57–1.00)
Glucose: 104 mg/dL — ABNORMAL HIGH (ref 70–99)
Potassium: 3.9 mmol/L (ref 3.5–5.2)
Sodium: 142 mmol/L (ref 134–144)
eGFR: 42 mL/min/{1.73_m2} — ABNORMAL LOW (ref >59)

## 2021-10-01 LAB — LIPASE: Lipase: 88 U/L — ABNORMAL HIGH (ref 14–85)

## 2021-10-04 ENCOUNTER — Other Ambulatory Visit: Payer: Self-pay

## 2021-10-04 ENCOUNTER — Ambulatory Visit (INDEPENDENT_AMBULATORY_CARE_PROVIDER_SITE_OTHER): Payer: Medicare Other | Admitting: Family Medicine

## 2021-10-04 ENCOUNTER — Encounter: Payer: Self-pay | Admitting: Family Medicine

## 2021-10-04 DIAGNOSIS — N289 Disorder of kidney and ureter, unspecified: Secondary | ICD-10-CM

## 2021-10-04 DIAGNOSIS — R748 Abnormal levels of other serum enzymes: Secondary | ICD-10-CM

## 2021-10-04 NOTE — Progress Notes (Signed)
? ?  Subjective:  ? ? Patient ID: Becky Baird, female    DOB: 1933-10-13, 88 y.o.   MRN: [id number] ? ?HPI ?Pt daughters here to discuss mom health and other questions.  ?Family consult today ?Family concerned about what her prognosis is ?Whether or not she can drive ?Occasionally she has anger issues about being asked or told what to do ?Does not want to have a sitter with her currently ?Has significant memory issues intermittently ?Does occasionally complain of stomach discomfort recent lipase was elevated repeat was minimally elevated ?ADLs ?Difficult for her to bathe herself does need family's help ?Needs family help with buying food prepping food cutting up food ?Not always good liquid intake ?Mild memory difficulties remembering specific details ?Fall risk ? ?Review of Systems ? ?   ?Objective:  ? Physical Exam ? ?Discussion with family today ?Examination of patient was not completed ?Patient was not able to come today had discussion with daughters only ?   ?Assessment & Plan:  ? ?Elevated lipase-if still elevated on follow-up lab work will do ultrasound of pancreas or CT scan ? ?Renal insufficiency-if this continues to be like this then it points towards CKD, adequate fluid intake recommended ?

## 2021-10-05 ENCOUNTER — Other Ambulatory Visit: Payer: Self-pay | Admitting: *Deleted

## 2021-10-05 DIAGNOSIS — R748 Abnormal levels of other serum enzymes: Secondary | ICD-10-CM

## 2021-10-05 DIAGNOSIS — N289 Disorder of kidney and ureter, unspecified: Secondary | ICD-10-CM

## 2021-10-05 NOTE — Addendum Note (Signed)
Addended by: Dairl Ponder on: 10/05/2021 10:19 AM ? ? Modules accepted: Orders ? ?

## 2021-10-09 ENCOUNTER — Encounter: Payer: Self-pay | Admitting: Family Medicine

## 2021-10-11 NOTE — Telephone Encounter (Signed)
Nurses ?Please notify family the symptoms should go away over the next couple days if it is related to the medicine.  If the symptoms continue then it will be necessary to run some blood test to rule out electrolyte disturbance.  I would recommend warm compresses over areas that are giving her trouble along with gentle massage and gentle stretching.  To follow-up if ongoing troubles-if not improved by Wednesday please touch base-thanks-Dr. Nicki Reaper ?

## 2021-10-13 ENCOUNTER — Other Ambulatory Visit: Payer: Self-pay | Admitting: Family Medicine

## 2021-10-21 ENCOUNTER — Telehealth: Payer: Self-pay

## 2021-10-21 NOTE — Telephone Encounter (Signed)
Front-it is fine to give her copies please follow in the standard protocols for record release thank you ?

## 2021-10-21 NOTE — Telephone Encounter (Signed)
Caller name:[name]  ? ?On DPR? :Yes ? ?Call back [phone] ? ?Provider they see: Wolfgang Phoenix  ? ?Reason for call:Pt needing copy of blood work from  2020 to 2023  ? ?

## 2021-10-26 NOTE — Telephone Encounter (Signed)
Labs are upfront for pick-up ?

## 2021-11-11 ENCOUNTER — Other Ambulatory Visit: Payer: Self-pay | Admitting: Family Medicine

## 2021-12-08 DIAGNOSIS — M79674 Pain in right toe(s): Secondary | ICD-10-CM | POA: Diagnosis not present

## 2021-12-08 DIAGNOSIS — M79672 Pain in left foot: Secondary | ICD-10-CM | POA: Diagnosis not present

## 2021-12-08 DIAGNOSIS — I739 Peripheral vascular disease, unspecified: Secondary | ICD-10-CM | POA: Diagnosis not present

## 2021-12-08 DIAGNOSIS — M79675 Pain in left toe(s): Secondary | ICD-10-CM | POA: Diagnosis not present

## 2021-12-08 DIAGNOSIS — M79671 Pain in right foot: Secondary | ICD-10-CM | POA: Diagnosis not present

## 2021-12-08 DIAGNOSIS — L110 Acquired keratosis follicularis: Secondary | ICD-10-CM | POA: Diagnosis not present

## 2021-12-10 ENCOUNTER — Other Ambulatory Visit: Payer: Self-pay | Admitting: Family Medicine

## 2021-12-16 ENCOUNTER — Encounter: Payer: Self-pay | Admitting: Family Medicine

## 2021-12-17 ENCOUNTER — Ambulatory Visit (INDEPENDENT_AMBULATORY_CARE_PROVIDER_SITE_OTHER): Payer: Medicare Other | Admitting: Family Medicine

## 2021-12-17 VITALS — BP 187/61 | HR 73 | Temp 98.0°F | Ht 64.0 in | Wt 140.0 lb

## 2021-12-17 DIAGNOSIS — R058 Other specified cough: Secondary | ICD-10-CM

## 2021-12-17 DIAGNOSIS — J020 Streptococcal pharyngitis: Secondary | ICD-10-CM | POA: Diagnosis not present

## 2021-12-17 DIAGNOSIS — J029 Acute pharyngitis, unspecified: Secondary | ICD-10-CM | POA: Diagnosis not present

## 2021-12-17 LAB — POCT RAPID STREP A (OFFICE): Rapid Strep A Screen: POSITIVE — AB

## 2021-12-17 MED ORDER — AMOXICILLIN 500 MG PO CAPS: 500.0000 mg | ORAL_CAPSULE | Freq: Three times a day (TID) | ORAL | 0 refills | Status: AC

## 2021-12-17 NOTE — Patient Instructions (Signed)
Strep Throat, Adult Strep throat is an infection of the throat. It is caused by germs (bacteria). Strep throat is common during the cold months of the year. It mostly affects children who are 5-86 years old. However, people of all ages can get it at any time of the year. This infection spreads from person to person through coughing, sneezing, or having close contact. What are the causes? This condition is caused by the Streptococcus pyogenes germ. What increases the risk? You care for young children. Children are more likely to get strep throat and may spread it to others. You go to crowded places. Germs can spread easily in such places. You kiss or touch someone who has strep throat. What are the signs or symptoms? Fever or chills. Redness, swelling, or pain in the tonsils or throat. Pain or trouble when swallowing. White or yellow spots on the tonsils or throat. Tender glands in the neck and under the jaw. Bad breath. Red rash all over the body. This is rare. How is this treated? Medicines that kill germs (antibiotics). Medicines that treat pain or fever. These include: Ibuprofen or acetaminophen. Aspirin, only for people who are over the age of 18. Cough drops. Throat sprays. Follow these instructions at home: Medicines  Take over-the-counter and prescription medicines only as told by your doctor. Take your antibiotic medicine as told by your doctor. Do not stop taking the antibiotic even if you start to feel better. Eating and drinking  If you have trouble swallowing, eat soft foods until your throat feels better. Drink enough fluid to keep your pee (urine) pale yellow. To help with pain, you may have: Warm fluids, such as soup and tea. Cold fluids, such as frozen desserts or popsicles. General instructions Rinse your mouth (gargle) with a salt-water mixture 3-4 times a day or as needed. To make a salt-water mixture, dissolve -1 tsp (3-6 g) of salt in 1 cup (237 mL) of warm  water. Rest as much as you can. Stay home from work or school until you have been taking antibiotics for 24 hours. Do not smoke or use any products that contain nicotine or tobacco. If you need help quitting, ask your doctor. Keep all follow-up visits. How is this prevented?  Do not share food, drinking cups, or personal items. They can cause the germs to spread. Wash your hands well with soap and water. Make sure that all people in your house wash their hands well. Have family members tested if they have a fever or a sore throat. They may need an antibiotic if they have strep throat. Contact a doctor if: You have swelling in your neck that keeps getting bigger. You get a rash, cough, or earache. You cough up a thick fluid that is green, yellow-brown, or bloody. You have pain that does not get better with medicine. Your symptoms get worse instead of getting better. You have a fever. Get help right away if: You vomit. You have a very bad headache. Your neck hurts or feels stiff. You have chest pain or are short of breath. You have drooling, very bad throat pain, or changes in your voice. Your neck is swollen, or the skin gets red and tender. Your mouth is dry, or you are peeing less than normal. You keep feeling more tired or have trouble waking up. Your joints are red or painful. These symptoms may be an emergency. Do not wait to see if the symptoms will go away. Get help right away. Call   your local emergency services (911 in the [place] Summary Strep throat is an infection of the throat. It is caused by germs (bacteria). This infection can spread from person to person through coughing, sneezing, or having close contact. Take your medicines, including antibiotics, as told by your doctor. Do not stop taking the antibiotic even if you start to feel better. To prevent the spread of germs, wash your hands well with soap and water. Have others do the same. Do not share food, drinking cups,  or personal items. Get help right away if you have a bad headache, chest pain, shortness of breath, a stiff or painful neck, or you vomit. This information is not intended to replace advice given to you by your health care provider. Make sure you discuss any questions you have with your health care provider. Document Revised: 11/03/2020 Document Reviewed: 11/03/2020 Elsevier Patient Education  2023 Elsevier Inc.  

## 2021-12-17 NOTE — Progress Notes (Unsigned)
   Subjective:    Patient ID: Becky Baird, female    DOB: May 26, 1934, 88 y.o.   MRN: [id number]  Cough The current episode started in the past 7 days.  Sore Throat  Associated symptoms include coughing.  Sore throat cough congestion symptoms for several days   Review of Systems  Respiratory:  Positive for cough.       Objective:   Physical Exam Gen-NAD not toxic TMS-normal bilateral T- normal no redness Chest-CTA respiratory rate normal no crackles CV RRR no murmur Skin-warm dry Neuro-grossly normal        Assessment & Plan:  Strep throat Antibiotic prescribed Should gradually get better Also has underlying upper respiratory illness that may take 5 to 7 days to turn the corner No sign of pneumonia

## 2021-12-27 DIAGNOSIS — R748 Abnormal levels of other serum enzymes: Secondary | ICD-10-CM | POA: Diagnosis not present

## 2021-12-27 DIAGNOSIS — R7989 Other specified abnormal findings of blood chemistry: Secondary | ICD-10-CM | POA: Diagnosis not present

## 2021-12-27 DIAGNOSIS — E7849 Other hyperlipidemia: Secondary | ICD-10-CM | POA: Diagnosis not present

## 2021-12-28 LAB — BASIC METABOLIC PANEL
BUN/Creatinine Ratio: 20 (ref 12–28)
BUN: 24 mg/dL (ref 8–27)
CO2: 23 mmol/L (ref 20–29)
Calcium: 10.2 mg/dL (ref 8.7–10.3)
Chloride: 99 mmol/L (ref 96–106)
Creatinine, Ser: 1.19 mg/dL — ABNORMAL HIGH (ref 0.57–1.00)
Glucose: 88 mg/dL (ref 70–99)
Potassium: 4.3 mmol/L (ref 3.5–5.2)
Sodium: 138 mmol/L (ref 134–144)
eGFR: 45 mL/min/{1.73_m2} — ABNORMAL LOW (ref >59)

## 2021-12-28 LAB — LIPID PANEL
Chol/HDL Ratio: 3.4 ratio (ref 0.0–4.4)
Cholesterol, Total: 210 mg/dL — ABNORMAL HIGH (ref 100–199)
HDL: 62 mg/dL (ref >39)
LDL Chol Calc (NIH): 133 mg/dL — ABNORMAL HIGH (ref 0–99)
Triglycerides: 85 mg/dL (ref 0–149)
VLDL Cholesterol Cal: 15 mg/dL (ref 5–40)

## 2021-12-28 LAB — LIPASE: Lipase: 78 U/L (ref 14–85)

## 2021-12-29 ENCOUNTER — Ambulatory Visit: Payer: Medicare Other | Admitting: Family Medicine

## 2022-01-10 ENCOUNTER — Other Ambulatory Visit: Payer: Self-pay | Admitting: Cardiology

## 2022-01-11 NOTE — Telephone Encounter (Signed)
Prescription refill request for Xarelto received.  Indication:Afib Last office visit:upcoming Weight:63.5 kg Age:88 Scr:1.1 CrCl:36.8 ml/min  Prescription refilled

## 2022-01-14 ENCOUNTER — Ambulatory Visit (INDEPENDENT_AMBULATORY_CARE_PROVIDER_SITE_OTHER): Payer: Medicare Other | Admitting: Family Medicine

## 2022-01-14 VITALS — BP 156/58 | HR 62 | Temp 98.1°F | Ht 64.0 in | Wt 137.0 lb

## 2022-01-14 DIAGNOSIS — R634 Abnormal weight loss: Secondary | ICD-10-CM

## 2022-01-14 DIAGNOSIS — R630 Anorexia: Secondary | ICD-10-CM

## 2022-01-14 DIAGNOSIS — F411 Generalized anxiety disorder: Secondary | ICD-10-CM | POA: Diagnosis not present

## 2022-01-14 DIAGNOSIS — N1831 Chronic kidney disease, stage 3a: Secondary | ICD-10-CM

## 2022-01-14 DIAGNOSIS — I10 Essential (primary) hypertension: Secondary | ICD-10-CM

## 2022-01-14 DIAGNOSIS — G4700 Insomnia, unspecified: Secondary | ICD-10-CM | POA: Diagnosis not present

## 2022-01-16 DIAGNOSIS — F411 Generalized anxiety disorder: Secondary | ICD-10-CM | POA: Insufficient documentation

## 2022-01-16 DIAGNOSIS — N1831 Chronic kidney disease, stage 3a: Secondary | ICD-10-CM | POA: Insufficient documentation

## 2022-02-02 DIAGNOSIS — M2041 Other hammer toe(s) (acquired), right foot: Secondary | ICD-10-CM | POA: Diagnosis not present

## 2022-02-02 DIAGNOSIS — M7751 Other enthesopathy of right foot: Secondary | ICD-10-CM | POA: Diagnosis not present

## 2022-02-02 DIAGNOSIS — M79671 Pain in right foot: Secondary | ICD-10-CM | POA: Diagnosis not present

## 2022-02-02 DIAGNOSIS — M79675 Pain in left toe(s): Secondary | ICD-10-CM | POA: Diagnosis not present

## 2022-02-02 DIAGNOSIS — M7752 Other enthesopathy of left foot: Secondary | ICD-10-CM | POA: Diagnosis not present

## 2022-02-02 DIAGNOSIS — M79672 Pain in left foot: Secondary | ICD-10-CM | POA: Diagnosis not present

## 2022-02-02 DIAGNOSIS — M79674 Pain in right toe(s): Secondary | ICD-10-CM | POA: Diagnosis not present

## 2022-02-02 DIAGNOSIS — M2042 Other hammer toe(s) (acquired), left foot: Secondary | ICD-10-CM | POA: Diagnosis not present

## 2022-02-07 ENCOUNTER — Telehealth: Payer: Self-pay

## 2022-02-07 NOTE — Telephone Encounter (Signed)
Patient calls and states the numbing of part of her left lip is still going on since the last time she saw the dentist back in June , she is to have her permanent crown put in her side of her mouth tomorrow and would like recommendations on weather she should have the steroids prescribed to help this. She states has adjusted her chewing to where she is not biting the inside of her lip. Please advise

## 2022-02-07 NOTE — Telephone Encounter (Signed)
Recommend office visit, may work in at the end of tomorrow afternoon

## 2022-02-07 NOTE — Telephone Encounter (Signed)
Appt given

## 2022-02-07 NOTE — Telephone Encounter (Signed)
Personally I am not a fan of utilizing prednisone to help with numbing of the left lip-not sure if I am totally understanding the situation

## 2022-02-07 NOTE — Telephone Encounter (Signed)
Spoke with patient's daughter [name] and informed of her of md message.  She states that her month had dental work done last month and lip on left side is still numb.  Pt is having a crown put on the tooth (that was worked on last month) tomorrow.  Patient's daughter says that pt had contacted the dentist and explained that her lip was numb.  He wanted to give her a low dose of steroid for 5 days or told her she could wait it out.  Pt opted to wait and it's been almost a month with her lip being numb. Patient is concerned about Bell's palsy.  Please advise. Thank you

## 2022-02-07 NOTE — Telephone Encounter (Signed)
Call pt's number [phone]

## 2022-02-08 ENCOUNTER — Encounter: Payer: Self-pay | Admitting: Family Medicine

## 2022-02-08 ENCOUNTER — Ambulatory Visit (INDEPENDENT_AMBULATORY_CARE_PROVIDER_SITE_OTHER): Payer: Medicare Other | Admitting: Family Medicine

## 2022-02-08 VITALS — BP 130/66 | HR 72 | Temp 97.9°F | Ht 64.0 in | Wt 142.0 lb

## 2022-02-08 DIAGNOSIS — R200 Anesthesia of skin: Secondary | ICD-10-CM

## 2022-02-08 NOTE — Progress Notes (Signed)
   Subjective:    Patient ID: Becky Baird, female    DOB: 1934-03-01, 88 y.o.   MRN: [id number]  HPI  Left lip numbness going on for about a month since dental procedure Bites inner lip frequent with chewing  Patient did have dental procedure in which she had numbing of that area ever since then has had some numbness No facial weakness except she just has a hard time feeling her lips when she is eating and drinking and a couple times is bit her lip She was concerned about the possibility of Bell's palsy  Review of Systems     Objective:   Physical Exam Numbness of the lip Consistent with the possibility of a nerve related inflammation or damage from dental numbing This is a incidental finding that can sometimes occur with numbing       Assessment & Plan:  Numbness of the lips no sign of Bell's palsy no sign of stroke if progressive troubles or problems follow-up otherwise keep regular follow-up visits  Patient relates that she is not able to walk around as well as she used to I recommend a walker with wheels I do not feel she needs a wheelchair at this point

## 2022-02-10 ENCOUNTER — Other Ambulatory Visit: Payer: Self-pay | Admitting: Family Medicine

## 2022-02-21 DIAGNOSIS — M79671 Pain in right foot: Secondary | ICD-10-CM | POA: Diagnosis not present

## 2022-02-21 DIAGNOSIS — I739 Peripheral vascular disease, unspecified: Secondary | ICD-10-CM | POA: Diagnosis not present

## 2022-02-21 DIAGNOSIS — L110 Acquired keratosis follicularis: Secondary | ICD-10-CM | POA: Diagnosis not present

## 2022-02-21 DIAGNOSIS — M79672 Pain in left foot: Secondary | ICD-10-CM | POA: Diagnosis not present

## 2022-02-21 DIAGNOSIS — M79675 Pain in left toe(s): Secondary | ICD-10-CM | POA: Diagnosis not present

## 2022-02-21 DIAGNOSIS — M79674 Pain in right toe(s): Secondary | ICD-10-CM | POA: Diagnosis not present

## 2022-03-15 ENCOUNTER — Other Ambulatory Visit: Payer: Self-pay | Admitting: Family Medicine

## 2022-04-04 DIAGNOSIS — R5383 Other fatigue: Secondary | ICD-10-CM | POA: Insufficient documentation

## 2022-04-04 NOTE — Progress Notes (Unsigned)
Cardiology Office Note   Date:  04/06/2022   ID:  Mida, Cory [birth year], MRN [id number]  PCP:  Kathyrn Drown, MD  Cardiologist:   Minus Breeding, MD   Chief Complaint  Patient presents with   Leg Swelling      History of Present Illness: Becky Baird is a 88 y.o. female who presents for follow up of PAF and previous stroke. At the last visit she had increased lower extremity swelling.  She had no evidence of DVT and did have improvement after some diuretic. The patient denies any new symptoms such as chest discomfort, neck or arm discomfort. There has been no new shortness of breath, PND or orthopnea. There have been no reported palpitations, presyncope or syncope.  Past Medical History:  Diagnosis Date   Anxiety    Chronic back pain    Hyperlipidemia    Hypertension    Memory loss    Mild cognitive impairment, so stated    Occlusion and stenosis of carotid artery with cerebral infarction    Bilateral less than 50% carotid stenosis.   Persistent atrial fibrillation ([place]) 06/23/2016   Renal cyst    Sciatica    Stroke ([place])    2010, RMCA with stent   Toe fracture 11/2015   R toe   Ulcer 1970s    Past Surgical History:  Procedure Laterality Date   BIOPSY  04/25/2018   Procedure: BIOPSY;  Surgeon: Rogene Houston, MD;  [facility];  Service: Endoscopy;;  gastric polyps   COLONOSCOPY  03/10/2011   Procedure: COLONOSCOPY;  Surgeon: Rogene Houston, MD;  [facility];  Service: Endoscopy;  Laterality: N/A;   COLONOSCOPY N/A 08/24/2016   Procedure: COLONOSCOPY;  Surgeon: Rogene Houston, MD;  [facility];  Service: Endoscopy;  Laterality: N/A;  [place]     years ago   ESOPHAGOGASTRODUODENOSCOPY N/A 04/25/2018   Procedure: ESOPHAGOGASTRODUODENOSCOPY (EGD);  Surgeon: Rogene Houston, MD;  [facility];  Service: Endoscopy;  Laterality: N/A;  1:15   OVARIAN CYST REMOVAL     at least  10 years ago   POLYPECTOMY  08/24/2016   Procedure: POLYPECTOMY;  Surgeon: Rogene Houston, MD;  [facility];  Service: Endoscopy;;  ascending colon; hepatic flexure   TONSILLECTOMY     childhood     Current Outpatient Medications  Medication Sig Dispense Refill   ALPRAZolam (XANAX) 0.25 MG tablet TAKE ONE TABLET BY MOUTH AT LUNCH TIME AS NEEDED, THEN TAKE ONE TABLET IN THE AFTERNOON AS NEEDED, AND ONE TABLET AT BEDTIME AS NEEDED 90 tablet 2   clobetasol ointment (TEMOVATE) 6.72 % Apply 1 application topically 2 (two) times daily as needed.     estradiol (ESTRACE) 0.1 MG/GM vaginal cream Place 1 Applicatorful vaginally once a week.     hydrochlorothiazide (HYDRODIURIL) 25 MG tablet TAKE ONE TABLET BY MOUTH ONCE DAILY **STOP AMLODIPINE** 90 tablet 1   Ketotifen Fumarate (ALAWAY OP) Apply 1 drop to eye daily as needed.     loratadine (CLARITIN) 10 MG tablet Take 1 tablet (10 mg total) by mouth daily. (Patient taking differently: Take 10 mg by mouth at bedtime.) 30 tablet 5   OVER THE COUNTER MEDICATION Take 1 capsule by mouth every morning. SUPREMA DOPHILUS     OVER THE COUNTER MEDICATION Take by mouth daily. Vitamin D3 2,000 units daily     pantoprazole (PROTONIX) 40  MG tablet TAKE ONE TABLET BY MOUTH DAILY 30 tablet 2   potassium chloride (KLOR-CON M) 10 MEQ tablet TAKE ONE TABLET BY MOUTH TWICE DAILY 60 tablet 2   sodium chloride (OCEAN) 0.65 % nasal spray Place 1 spray into the nose as needed for congestion.      vitamin B-12 (CYANOCOBALAMIN) 1000 MCG tablet Take 1,000 mcg by mouth daily.     XARELTO 15 MG TABS tablet TAKE ONE TABLET BY MOUTH DAILY WITH SUPPER 90 tablet 1   No current facility-administered medications for this visit.    Allergies:   Amlodipine, Statins, Neurontin [gabapentin], Niaspan [niacin er], and Oxybutynin    ROS:  Please see the history of present illness.   Otherwise, review of systems are positive for none.   All other systems are reviewed and  negative.    PHYSICAL EXAM: VS:  BP (!) 130/52   Pulse 69   Ht '5\' 4"'$  (1.626 m)   Wt 142 lb (64.4 kg)   BMI 24.37 kg/m  , BMI Body mass index is 24.37 kg/m. GENERAL:  Well appearing NECK:  No jugular venous distention, waveform within normal limits, carotid upstroke brisk and symmetric, no bruits, no thyromegaly LUNGS:  Clear to auscultation bilaterally CHEST:  Unremarkable HEART:  PMI not displaced or sustained,S1 and S2 within normal limits, no S3, no S4, no clicks, no rubs, 2 out of 6 apical systolic murmur radiating at aortic outflow tract and into the axilla, no diastolic murmurs ABD:  Flat, positive bowel sounds normal in frequency in pitch, no bruits, no rebound, no guarding, no midline pulsatile mass, no hepatomegaly, no splenomegaly EXT:  2 plus pulses throughout, no edema, no cyanosis no clubbing   EKG:  EKG is  ordered today. The ekg ordered for 04/06/2022 demonstrates sinus rhythm, rate 69, left axis deviation, intervals within normal limits, poor anterior R wave progression, no acute ST-T wave changes.  PVCs in a bigeminal pattern.  Of note an incomplete left bundle branch block has not progressed to complete.   Recent Labs: 09/03/2021: Magnesium 2.2 09/17/2021: BNP 185.4; Hemoglobin 12.2; Platelets 276 09/29/2021: ALT 12 12/27/2021: BUN 24; Creatinine, Ser 1.19; Potassium 4.3; Sodium 138    Lipid Panel    Component Value Date/Time   CHOL 210 (H) 12/27/2021 1106   TRIG 85 12/27/2021 1106   HDL 62 12/27/2021 1106   CHOLHDL 3.4 12/27/2021 1106   CHOLHDL 2.8 11/10/2019 0000   VLDL 13 11/10/2019 0000   LDLCALC 133 (H) 12/27/2021 1106      Wt Readings from Last 3 Encounters:  04/06/22 142 lb (64.4 kg)  02/08/22 142 lb (64.4 kg)  01/14/22 137 lb (62.1 kg)      Other studies Reviewed: Additional studies/ records that were reviewed today include: Labs.    Review of the above records demonstrates:  Please see elsewhere in the note.     ASSESSMENT AND PLAN:   LEG  SWELLING:   This is improved.  No change in therapy.  PAF:    Becky Baird has a CHA2DS2 - VASc score of 6.  She tolerates anticoagulation.  No change in therapy.  HTN:   The blood pressure is at target.  No change in therapy.   AORTIC STENOSIS:   She had moderate aortic insufficiency and no significant stenosis mention on echo in January.  I will be repeating this in January of next year.  CAROTID STENOSIS: This was mild previously.  No further testing.   MR:  This will be evaluated as above.  BRADYCARDIA: The patient has asymptomatic bigeminy.  She also has progression of the left bundle branch block but no symptoms related to bradycardia arrhythmia.  At this point there is no indication for pacing but she will let me know if she ever has any presyncope or syncope.  Current medicines are reviewed at length with the patient today.  The patient does not have concerns regarding medicines.  The following changes have been made: None  Labs/ tests ordered today include:  None   Orders Placed This Encounter  Procedures   EKG 12-Lead   ECHOCARDIOGRAM COMPLETE     Disposition:   FU with me in one year   Signed, Minus Breeding, MD  04/06/2022 12:39 PM    [place]

## 2022-04-06 ENCOUNTER — Ambulatory Visit (INDEPENDENT_AMBULATORY_CARE_PROVIDER_SITE_OTHER): Payer: Medicare Other | Admitting: Cardiology

## 2022-04-06 ENCOUNTER — Encounter: Payer: Self-pay | Admitting: Cardiology

## 2022-04-06 VITALS — BP 130/52 | HR 69 | Ht 64.0 in | Wt 142.0 lb

## 2022-04-06 DIAGNOSIS — M7989 Other specified soft tissue disorders: Secondary | ICD-10-CM | POA: Diagnosis not present

## 2022-04-06 DIAGNOSIS — I6529 Occlusion and stenosis of unspecified carotid artery: Secondary | ICD-10-CM | POA: Diagnosis not present

## 2022-04-06 DIAGNOSIS — I350 Nonrheumatic aortic (valve) stenosis: Secondary | ICD-10-CM | POA: Diagnosis not present

## 2022-04-06 DIAGNOSIS — R5383 Other fatigue: Secondary | ICD-10-CM | POA: Diagnosis not present

## 2022-04-06 DIAGNOSIS — I480 Paroxysmal atrial fibrillation: Secondary | ICD-10-CM | POA: Diagnosis not present

## 2022-04-06 NOTE — Patient Instructions (Signed)
Medication Instructions:  The current medical regimen is effective;  continue present plan and medications.  *If you need a refill on your cardiac medications before your next appointment, please call your pharmacy*  Testing/Procedures: Your physician has requested that you have an echocardiogram in January at [facility] Echocardiography is a painless test that uses sound waves to create images of your heart. It provides your doctor with information about the size and shape of your heart and how well your heart's chambers and valves are working. This procedure takes approximately one hour. There are no restrictions for this procedure.  Follow-Up: At [facility], you and your health needs are our priority.  As part of our continuing mission to provide you with exceptional heart care, we have created designated Provider Care Teams.  These Care Teams include your primary Cardiologist (physician) and Advanced Practice Providers (APPs -  Physician Assistants and Nurse Practitioners) who all work together to provide you with the care you need, when you need it.  We recommend signing up for the patient portal called "MyChart".  Sign up information is provided on this After Visit Summary.  MyChart is used to connect with patients for Virtual Visits (Telemedicine).  Patients are able to view lab/test results, encounter notes, upcoming appointments, etc.  Non-urgent messages can be sent to your provider as well.   To learn more about what you can do with MyChart, go to [email or web address].    Your next appointment:   1 year(s)  The format for your next appointment:   In Person  Provider:   Minus Breeding, MD      Important Information About Sugar

## 2022-04-14 DIAGNOSIS — R300 Dysuria: Secondary | ICD-10-CM | POA: Diagnosis not present

## 2022-04-21 DIAGNOSIS — I4891 Unspecified atrial fibrillation: Secondary | ICD-10-CM | POA: Diagnosis not present

## 2022-04-21 DIAGNOSIS — B965 Pseudomonas (aeruginosa) (mallei) (pseudomallei) as the cause of diseases classified elsewhere: Secondary | ICD-10-CM | POA: Diagnosis not present

## 2022-04-21 DIAGNOSIS — E871 Hypo-osmolality and hyponatremia: Secondary | ICD-10-CM | POA: Diagnosis present

## 2022-04-21 DIAGNOSIS — F0390 Unspecified dementia without behavioral disturbance: Secondary | ICD-10-CM | POA: Diagnosis not present

## 2022-04-21 DIAGNOSIS — E7800 Pure hypercholesterolemia, unspecified: Secondary | ICD-10-CM | POA: Diagnosis not present

## 2022-04-21 DIAGNOSIS — G629 Polyneuropathy, unspecified: Secondary | ICD-10-CM | POA: Diagnosis not present

## 2022-04-21 DIAGNOSIS — F419 Anxiety disorder, unspecified: Secondary | ICD-10-CM | POA: Diagnosis not present

## 2022-04-21 DIAGNOSIS — R638 Other symptoms and signs concerning food and fluid intake: Secondary | ICD-10-CM | POA: Diagnosis not present

## 2022-04-21 DIAGNOSIS — R600 Localized edema: Secondary | ICD-10-CM | POA: Diagnosis not present

## 2022-04-21 DIAGNOSIS — G9341 Metabolic encephalopathy: Secondary | ICD-10-CM | POA: Diagnosis present

## 2022-04-21 DIAGNOSIS — R41841 Cognitive communication deficit: Secondary | ICD-10-CM | POA: Diagnosis not present

## 2022-04-21 DIAGNOSIS — N183 Chronic kidney disease, stage 3 unspecified: Secondary | ICD-10-CM | POA: Diagnosis not present

## 2022-04-21 DIAGNOSIS — F32A Depression, unspecified: Secondary | ICD-10-CM | POA: Diagnosis not present

## 2022-04-21 DIAGNOSIS — N3090 Cystitis, unspecified without hematuria: Secondary | ICD-10-CM | POA: Diagnosis not present

## 2022-04-21 DIAGNOSIS — N390 Urinary tract infection, site not specified: Secondary | ICD-10-CM | POA: Diagnosis not present

## 2022-04-21 DIAGNOSIS — H538 Other visual disturbances: Secondary | ICD-10-CM | POA: Diagnosis not present

## 2022-04-21 DIAGNOSIS — R000 Tachycardia, unspecified: Secondary | ICD-10-CM | POA: Diagnosis not present

## 2022-04-21 DIAGNOSIS — I5032 Chronic diastolic (congestive) heart failure: Secondary | ICD-10-CM | POA: Diagnosis not present

## 2022-04-21 DIAGNOSIS — Z7901 Long term (current) use of anticoagulants: Secondary | ICD-10-CM | POA: Diagnosis not present

## 2022-04-21 DIAGNOSIS — R4182 Altered mental status, unspecified: Secondary | ICD-10-CM | POA: Diagnosis not present

## 2022-04-21 DIAGNOSIS — R531 Weakness: Secondary | ICD-10-CM | POA: Diagnosis not present

## 2022-04-21 DIAGNOSIS — Z87440 Personal history of urinary (tract) infections: Secondary | ICD-10-CM | POA: Diagnosis not present

## 2022-04-21 DIAGNOSIS — Z8673 Personal history of transient ischemic attack (TIA), and cerebral infarction without residual deficits: Secondary | ICD-10-CM | POA: Diagnosis not present

## 2022-04-21 DIAGNOSIS — G8929 Other chronic pain: Secondary | ICD-10-CM | POA: Diagnosis not present

## 2022-04-21 DIAGNOSIS — R2689 Other abnormalities of gait and mobility: Secondary | ICD-10-CM | POA: Diagnosis not present

## 2022-04-21 DIAGNOSIS — Z8659 Personal history of other mental and behavioral disorders: Secondary | ICD-10-CM | POA: Diagnosis not present

## 2022-04-21 DIAGNOSIS — K219 Gastro-esophageal reflux disease without esophagitis: Secondary | ICD-10-CM | POA: Diagnosis not present

## 2022-04-21 DIAGNOSIS — Z79899 Other long term (current) drug therapy: Secondary | ICD-10-CM | POA: Diagnosis not present

## 2022-04-21 DIAGNOSIS — I10 Essential (primary) hypertension: Secondary | ICD-10-CM | POA: Diagnosis not present

## 2022-04-21 DIAGNOSIS — B9620 Unspecified Escherichia coli [E. coli] as the cause of diseases classified elsewhere: Secondary | ICD-10-CM | POA: Diagnosis not present

## 2022-04-21 DIAGNOSIS — N3000 Acute cystitis without hematuria: Secondary | ICD-10-CM | POA: Diagnosis not present

## 2022-04-21 DIAGNOSIS — M6281 Muscle weakness (generalized): Secondary | ICD-10-CM | POA: Diagnosis not present

## 2022-04-21 DIAGNOSIS — R52 Pain, unspecified: Secondary | ICD-10-CM | POA: Diagnosis not present

## 2022-04-21 DIAGNOSIS — I130 Hypertensive heart and chronic kidney disease with heart failure and stage 1 through stage 4 chronic kidney disease, or unspecified chronic kidney disease: Secondary | ICD-10-CM | POA: Diagnosis not present

## 2022-04-21 DIAGNOSIS — Z792 Long term (current) use of antibiotics: Secondary | ICD-10-CM | POA: Diagnosis not present

## 2022-04-21 DIAGNOSIS — I129 Hypertensive chronic kidney disease with stage 1 through stage 4 chronic kidney disease, or unspecified chronic kidney disease: Secondary | ICD-10-CM | POA: Diagnosis not present

## 2022-04-21 DIAGNOSIS — R109 Unspecified abdominal pain: Secondary | ICD-10-CM | POA: Diagnosis not present

## 2022-04-21 DIAGNOSIS — I6381 Other cerebral infarction due to occlusion or stenosis of small artery: Secondary | ICD-10-CM | POA: Diagnosis not present

## 2022-04-23 DIAGNOSIS — I5032 Chronic diastolic (congestive) heart failure: Secondary | ICD-10-CM | POA: Insufficient documentation

## 2022-04-26 DIAGNOSIS — N390 Urinary tract infection, site not specified: Secondary | ICD-10-CM | POA: Diagnosis not present

## 2022-04-26 DIAGNOSIS — Z23 Encounter for immunization: Secondary | ICD-10-CM | POA: Diagnosis not present

## 2022-04-26 DIAGNOSIS — N3090 Cystitis, unspecified without hematuria: Secondary | ICD-10-CM | POA: Diagnosis not present

## 2022-04-26 DIAGNOSIS — R4182 Altered mental status, unspecified: Secondary | ICD-10-CM | POA: Diagnosis not present

## 2022-04-26 DIAGNOSIS — I10 Essential (primary) hypertension: Secondary | ICD-10-CM | POA: Diagnosis not present

## 2022-04-26 DIAGNOSIS — R41841 Cognitive communication deficit: Secondary | ICD-10-CM | POA: Diagnosis not present

## 2022-04-26 DIAGNOSIS — I130 Hypertensive heart and chronic kidney disease with heart failure and stage 1 through stage 4 chronic kidney disease, or unspecified chronic kidney disease: Secondary | ICD-10-CM | POA: Diagnosis not present

## 2022-04-26 DIAGNOSIS — B965 Pseudomonas (aeruginosa) (mallei) (pseudomallei) as the cause of diseases classified elsewhere: Secondary | ICD-10-CM | POA: Diagnosis not present

## 2022-04-26 DIAGNOSIS — R531 Weakness: Secondary | ICD-10-CM | POA: Diagnosis not present

## 2022-04-26 DIAGNOSIS — B9620 Unspecified Escherichia coli [E. coli] as the cause of diseases classified elsewhere: Secondary | ICD-10-CM | POA: Diagnosis not present

## 2022-04-26 DIAGNOSIS — M6281 Muscle weakness (generalized): Secondary | ICD-10-CM | POA: Diagnosis not present

## 2022-04-26 DIAGNOSIS — I5032 Chronic diastolic (congestive) heart failure: Secondary | ICD-10-CM | POA: Diagnosis not present

## 2022-04-26 DIAGNOSIS — N183 Chronic kidney disease, stage 3 unspecified: Secondary | ICD-10-CM | POA: Diagnosis not present

## 2022-04-26 DIAGNOSIS — F419 Anxiety disorder, unspecified: Secondary | ICD-10-CM | POA: Diagnosis not present

## 2022-04-26 DIAGNOSIS — G9341 Metabolic encephalopathy: Secondary | ICD-10-CM | POA: Diagnosis not present

## 2022-04-26 DIAGNOSIS — Z8673 Personal history of transient ischemic attack (TIA), and cerebral infarction without residual deficits: Secondary | ICD-10-CM | POA: Diagnosis not present

## 2022-04-26 DIAGNOSIS — R2689 Other abnormalities of gait and mobility: Secondary | ICD-10-CM | POA: Diagnosis not present

## 2022-04-26 DIAGNOSIS — E871 Hypo-osmolality and hyponatremia: Secondary | ICD-10-CM | POA: Diagnosis not present

## 2022-04-26 DIAGNOSIS — Z792 Long term (current) use of antibiotics: Secondary | ICD-10-CM | POA: Diagnosis not present

## 2022-04-27 ENCOUNTER — Ambulatory Visit: Payer: Medicare Other | Admitting: Family Medicine

## 2022-04-28 ENCOUNTER — Encounter: Payer: Self-pay | Admitting: Family Medicine

## 2022-04-28 ENCOUNTER — Telehealth: Payer: Self-pay | Admitting: *Deleted

## 2022-04-28 NOTE — Telephone Encounter (Signed)
Daughter left message on nurse's voicemail- see my chart message 49/4/49- duplicate message- message handled through my chart by provider

## 2022-05-03 ENCOUNTER — Ambulatory Visit: Payer: Medicare Other | Admitting: Family Medicine

## 2022-05-11 ENCOUNTER — Ambulatory Visit (INDEPENDENT_AMBULATORY_CARE_PROVIDER_SITE_OTHER): Payer: Medicare Other | Admitting: Family Medicine

## 2022-05-11 VITALS — BP 133/68 | HR 78 | Temp 97.7°F | Ht 64.0 in | Wt 144.0 lb

## 2022-05-11 DIAGNOSIS — R7989 Other specified abnormal findings of blood chemistry: Secondary | ICD-10-CM

## 2022-05-11 DIAGNOSIS — I6529 Occlusion and stenosis of unspecified carotid artery: Secondary | ICD-10-CM | POA: Diagnosis not present

## 2022-05-11 DIAGNOSIS — D649 Anemia, unspecified: Secondary | ICD-10-CM

## 2022-05-11 DIAGNOSIS — I10 Essential (primary) hypertension: Secondary | ICD-10-CM

## 2022-05-11 NOTE — Progress Notes (Signed)
   Subjective:    Patient ID: Becky Baird, female    DOB: 16-Aug-1933, 88 y.o.   MRN: [id number]  HPI Hospitalization follow up from UTI altered mental status    Review of Systems     Objective:   Physical Exam        Assessment & Plan:

## 2022-05-11 NOTE — Patient Instructions (Addendum)
Hi Becky Baird  So sorry that you are in the hospital.  I am glad that you are out.  Please do your blood work today. You may take the lisinopril that they are prescribing. We will know more once we have your blood work results back. Do not take hydralazine currently. When you come on Monday please bring all your pill bottles in a bag so we can go over all of these.  Please follow-up or call sooner if any problems. Take care Dr Nicki Reaper

## 2022-05-11 NOTE — Progress Notes (Signed)
   Subjective:    Patient ID: Becky Baird, female    DOB: Oct 20, 1933, 88 y.o.   MRN: [id number]  HPI  Significant issues Recently hospitalized I reviewed over her H&P as well as discharge summary with the patient I reviewed over her labs with her also reviewed over her hospitalization as well as her rehab They changed up her blood pressure medicine she has not started those yet she has been noting swelling in her lower ankles.  She finds her self feeling rundown  Review of Systems     Objective:   Physical Exam Is clear hearts regular edema is noted in the ankles blood pressure doing well       Assessment & Plan:  She will be taken the lisinopril Also with HCTZ We will check kidney function and other lab work Difficult situation Patient will bring all of their medicines next week for [place] to review Patient does not need any home therapy currently

## 2022-05-12 ENCOUNTER — Encounter: Payer: Self-pay | Admitting: Family Medicine

## 2022-05-12 ENCOUNTER — Telehealth: Payer: Self-pay

## 2022-05-12 NOTE — Telephone Encounter (Signed)
Nurses So I was aware of needing blood pressure instructions and I provided that through my chart and provided it to the patient yesterday  As for diarrhea I am unaware that that was not something that we discussed when she was here I would recommend unless the diarrhea is profuse that hopefully an occasional Imodium would help  If she is having multiple loose stools I would recommend C. difficile testing with reflex toxin testing of the stool They could pick up the necessary supplies tomorrow

## 2022-05-12 NOTE — Telephone Encounter (Signed)
Caller name:Jakara Joya Gaskins   On DPR? :Yes  Call back number:[phone]  Provider they see: [place]   Reason for call:Pt is taking fluid pill and now getting diarrhea The new RX she will start int he morning lisinopril. Pt is concerned and don't know what to do she started taking her ongoing fluid pill for the first time again this morning and she don't know what to do about the diarrhea?

## 2022-05-12 NOTE — Telephone Encounter (Signed)
Patient will start the Lisinopril tomorrow but today she went and started all her old meds from pre hospital and rehab including fluid pill and she had diarrhea for about 30 minutes but she now says it is better ( the daughters have Covid and can not go there currently)  they wanted you to know that cognitively she has took a significant down slide since her hospitalization and rehab stay. They said it has really gotten bad- in hospital they did a cognitive test on her and she scored 13 and they not sure what to do at this point but just wanted you to be aware of her cognitive state currently  Daughter Coralyn Mark requests you sent the response in my chart in her moms chart please

## 2022-05-13 ENCOUNTER — Encounter: Payer: Self-pay | Admitting: Family Medicine

## 2022-05-13 ENCOUNTER — Telehealth: Payer: Self-pay | Admitting: Family Medicine

## 2022-05-13 LAB — CBC WITH DIFFERENTIAL/PLATELET
Basophils Absolute: 0 10*3/uL (ref 0.0–0.2)
Basos: 1 %
EOS (ABSOLUTE): 0.1 10*3/uL (ref 0.0–0.4)
Eos: 2 %
Hematocrit: 32.2 % — ABNORMAL LOW (ref 34.0–46.6)
Hemoglobin: 11.2 g/dL (ref 11.1–15.9)
Immature Grans (Abs): 0.1 10*3/uL (ref 0.0–0.1)
Immature Granulocytes: 1 %
Lymphocytes Absolute: 1.6 10*3/uL (ref 0.7–3.1)
Lymphs: 22 %
MCH: 30.6 pg (ref 26.6–33.0)
MCHC: 34.8 g/dL (ref 31.5–35.7)
MCV: 88 fL (ref 79–97)
Monocytes Absolute: 0.8 10*3/uL (ref 0.1–0.9)
Monocytes: 11 %
Neutrophils Absolute: 4.7 10*3/uL (ref 1.4–7.0)
Neutrophils: 63 %
Platelets: 344 10*3/uL (ref 150–450)
RBC: 3.66 x10E6/uL — ABNORMAL LOW (ref 3.77–5.28)
RDW: 12.1 % (ref 11.7–15.4)
WBC: 7.4 10*3/uL (ref 3.4–10.8)

## 2022-05-13 LAB — BASIC METABOLIC PANEL
BUN/Creatinine Ratio: 16 (ref 12–28)
BUN: 26 mg/dL (ref 8–27)
CO2: 16 mmol/L — ABNORMAL LOW (ref 20–29)
Calcium: 10.2 mg/dL (ref 8.7–10.3)
Chloride: 96 mmol/L (ref 96–106)
Creatinine, Ser: 1.62 mg/dL — ABNORMAL HIGH (ref 0.57–1.00)
Glucose: 99 mg/dL (ref 70–99)
Potassium: 5.4 mmol/L — ABNORMAL HIGH (ref 3.5–5.2)
Sodium: 132 mmol/L — ABNORMAL LOW (ref 134–144)
eGFR: 31 mL/min/{1.73_m2} — ABNORMAL LOW (ref >59)

## 2022-05-13 LAB — MAGNESIUM: Magnesium: 2 mg/dL (ref 1.6–2.3)

## 2022-05-13 NOTE — Telephone Encounter (Signed)
Please communicate with patient and daughter.  Hold off on all lisinopril and HCTZ.  Bring all pill bottles with them.  Please see result note.  Kidney function elevated.

## 2022-05-13 NOTE — Telephone Encounter (Signed)
See my chart message from daughter: " Good morning, I just saw Dr. Bary Leriche note on Mom's kidney function test. She has stopped all of her blood pressure and diuretic medicines. Thanks!"

## 2022-05-16 ENCOUNTER — Encounter: Payer: Self-pay | Admitting: Family Medicine

## 2022-05-16 ENCOUNTER — Ambulatory Visit (INDEPENDENT_AMBULATORY_CARE_PROVIDER_SITE_OTHER): Payer: Medicare Other | Admitting: Family Medicine

## 2022-05-16 VITALS — BP 134/62 | Wt 141.4 lb

## 2022-05-16 DIAGNOSIS — I10 Essential (primary) hypertension: Secondary | ICD-10-CM | POA: Diagnosis not present

## 2022-05-16 DIAGNOSIS — I6529 Occlusion and stenosis of unspecified carotid artery: Secondary | ICD-10-CM

## 2022-05-16 NOTE — Progress Notes (Signed)
   Subjective:    Patient ID: Becky Baird, female    DOB: 1933/09/04, 88 y.o.   MRN: [id number]  HPI Pt arrives for follow up. Pt has [employer] with her today-friend of patient. No issues at this time.  I reviewed over her medicines She is taking hydrochlorothiazide Not taking lisinopril Blood pressure is good   Review of Systems     Objective:   Physical Exam  Lungs are heart regular edema noted in both ankles worse on the left than the right      Assessment & Plan:   Stocking recommended Continue HCTZ for now Patient was encouraged to let other people do the driving for her for now She will follow-up in 3 to 4 weeks we will do cognitive testing at that time  Lab work await the results

## 2022-05-17 ENCOUNTER — Encounter: Payer: Self-pay | Admitting: Family Medicine

## 2022-05-17 LAB — BASIC METABOLIC PANEL
BUN/Creatinine Ratio: 14 (ref 12–28)
BUN: 17 mg/dL (ref 8–27)
CO2: 22 mmol/L (ref 20–29)
Calcium: 10 mg/dL (ref 8.7–10.3)
Chloride: 100 mmol/L (ref 96–106)
Creatinine, Ser: 1.19 mg/dL — ABNORMAL HIGH (ref 0.57–1.00)
Glucose: 88 mg/dL (ref 70–99)
Potassium: 4.9 mmol/L (ref 3.5–5.2)
Sodium: 137 mmol/L (ref 134–144)
eGFR: 45 mL/min/{1.73_m2} — ABNORMAL LOW (ref >59)

## 2022-05-17 NOTE — Telephone Encounter (Signed)
November 22 is fine may cancel out the 13th

## 2022-05-25 DIAGNOSIS — M79675 Pain in left toe(s): Secondary | ICD-10-CM | POA: Diagnosis not present

## 2022-05-25 DIAGNOSIS — M79671 Pain in right foot: Secondary | ICD-10-CM | POA: Diagnosis not present

## 2022-05-25 DIAGNOSIS — I739 Peripheral vascular disease, unspecified: Secondary | ICD-10-CM | POA: Diagnosis not present

## 2022-05-25 DIAGNOSIS — M79672 Pain in left foot: Secondary | ICD-10-CM | POA: Diagnosis not present

## 2022-05-25 DIAGNOSIS — M79674 Pain in right toe(s): Secondary | ICD-10-CM | POA: Diagnosis not present

## 2022-05-25 DIAGNOSIS — L110 Acquired keratosis follicularis: Secondary | ICD-10-CM | POA: Diagnosis not present

## 2022-05-26 DIAGNOSIS — K219 Gastro-esophageal reflux disease without esophagitis: Secondary | ICD-10-CM | POA: Diagnosis not present

## 2022-05-26 DIAGNOSIS — F419 Anxiety disorder, unspecified: Secondary | ICD-10-CM | POA: Diagnosis not present

## 2022-05-26 DIAGNOSIS — G9341 Metabolic encephalopathy: Secondary | ICD-10-CM | POA: Diagnosis not present

## 2022-05-26 DIAGNOSIS — Z9181 History of falling: Secondary | ICD-10-CM | POA: Diagnosis not present

## 2022-05-26 DIAGNOSIS — Z7901 Long term (current) use of anticoagulants: Secondary | ICD-10-CM | POA: Diagnosis not present

## 2022-05-26 DIAGNOSIS — I4891 Unspecified atrial fibrillation: Secondary | ICD-10-CM | POA: Diagnosis not present

## 2022-05-26 DIAGNOSIS — N183 Chronic kidney disease, stage 3 unspecified: Secondary | ICD-10-CM | POA: Diagnosis not present

## 2022-05-26 DIAGNOSIS — Z87440 Personal history of urinary (tract) infections: Secondary | ICD-10-CM | POA: Diagnosis not present

## 2022-05-26 DIAGNOSIS — M549 Dorsalgia, unspecified: Secondary | ICD-10-CM | POA: Diagnosis not present

## 2022-05-26 DIAGNOSIS — G8929 Other chronic pain: Secondary | ICD-10-CM | POA: Diagnosis not present

## 2022-05-26 DIAGNOSIS — I130 Hypertensive heart and chronic kidney disease with heart failure and stage 1 through stage 4 chronic kidney disease, or unspecified chronic kidney disease: Secondary | ICD-10-CM | POA: Diagnosis not present

## 2022-05-26 DIAGNOSIS — I5032 Chronic diastolic (congestive) heart failure: Secondary | ICD-10-CM | POA: Diagnosis not present

## 2022-05-26 DIAGNOSIS — Z8673 Personal history of transient ischemic attack (TIA), and cerebral infarction without residual deficits: Secondary | ICD-10-CM | POA: Diagnosis not present

## 2022-05-27 ENCOUNTER — Telehealth: Payer: Self-pay

## 2022-05-27 DIAGNOSIS — G9341 Metabolic encephalopathy: Secondary | ICD-10-CM | POA: Diagnosis not present

## 2022-05-27 DIAGNOSIS — I4891 Unspecified atrial fibrillation: Secondary | ICD-10-CM | POA: Diagnosis not present

## 2022-05-27 DIAGNOSIS — N183 Chronic kidney disease, stage 3 unspecified: Secondary | ICD-10-CM | POA: Diagnosis not present

## 2022-05-27 DIAGNOSIS — M549 Dorsalgia, unspecified: Secondary | ICD-10-CM | POA: Diagnosis not present

## 2022-05-27 DIAGNOSIS — I5032 Chronic diastolic (congestive) heart failure: Secondary | ICD-10-CM | POA: Diagnosis not present

## 2022-05-27 DIAGNOSIS — I130 Hypertensive heart and chronic kidney disease with heart failure and stage 1 through stage 4 chronic kidney disease, or unspecified chronic kidney disease: Secondary | ICD-10-CM | POA: Diagnosis not present

## 2022-05-27 NOTE — Telephone Encounter (Signed)
Verbal order given to Clarise Cruz at [facility]

## 2022-05-27 NOTE — Telephone Encounter (Signed)
It is fine to go ahead and give verbal okay

## 2022-05-27 NOTE — Telephone Encounter (Signed)
Caller name: JANISE GORA [facility] with Alvis Lemmings)   On DPR?: Yes  Call back number: [phone]  Provider they see: Kathyrn Drown, MD  Reason for call:Needs nursing once a week for 4 weeks

## 2022-05-30 ENCOUNTER — Encounter: Payer: Self-pay | Admitting: Family Medicine

## 2022-05-31 ENCOUNTER — Telehealth: Payer: Self-pay | Admitting: *Deleted

## 2022-05-31 DIAGNOSIS — M549 Dorsalgia, unspecified: Secondary | ICD-10-CM | POA: Diagnosis not present

## 2022-05-31 DIAGNOSIS — I5032 Chronic diastolic (congestive) heart failure: Secondary | ICD-10-CM | POA: Diagnosis not present

## 2022-05-31 DIAGNOSIS — G9341 Metabolic encephalopathy: Secondary | ICD-10-CM | POA: Diagnosis not present

## 2022-05-31 DIAGNOSIS — I130 Hypertensive heart and chronic kidney disease with heart failure and stage 1 through stage 4 chronic kidney disease, or unspecified chronic kidney disease: Secondary | ICD-10-CM | POA: Diagnosis not present

## 2022-05-31 DIAGNOSIS — I4891 Unspecified atrial fibrillation: Secondary | ICD-10-CM | POA: Diagnosis not present

## 2022-05-31 DIAGNOSIS — N183 Chronic kidney disease, stage 3 unspecified: Secondary | ICD-10-CM | POA: Diagnosis not present

## 2022-05-31 NOTE — Telephone Encounter (Signed)
Tressia Danas PT at [facility] left message requesting orders for Pt for strength and balance and exercise program if you feel like she is able to proceed after recent hospitalization (return number was not left on message)

## 2022-05-31 NOTE — Telephone Encounter (Signed)
May have verbal for this

## 2022-06-01 DIAGNOSIS — G9341 Metabolic encephalopathy: Secondary | ICD-10-CM | POA: Diagnosis not present

## 2022-06-01 DIAGNOSIS — M549 Dorsalgia, unspecified: Secondary | ICD-10-CM | POA: Diagnosis not present

## 2022-06-01 DIAGNOSIS — N183 Chronic kidney disease, stage 3 unspecified: Secondary | ICD-10-CM | POA: Diagnosis not present

## 2022-06-01 DIAGNOSIS — I130 Hypertensive heart and chronic kidney disease with heart failure and stage 1 through stage 4 chronic kidney disease, or unspecified chronic kidney disease: Secondary | ICD-10-CM | POA: Diagnosis not present

## 2022-06-01 DIAGNOSIS — I5032 Chronic diastolic (congestive) heart failure: Secondary | ICD-10-CM | POA: Diagnosis not present

## 2022-06-01 DIAGNOSIS — I4891 Unspecified atrial fibrillation: Secondary | ICD-10-CM | POA: Diagnosis not present

## 2022-06-01 NOTE — Telephone Encounter (Signed)
Called back and gave auth for PT orders en Colin Rhein [phone].

## 2022-06-03 DIAGNOSIS — I5032 Chronic diastolic (congestive) heart failure: Secondary | ICD-10-CM | POA: Diagnosis not present

## 2022-06-03 DIAGNOSIS — N183 Chronic kidney disease, stage 3 unspecified: Secondary | ICD-10-CM | POA: Diagnosis not present

## 2022-06-03 DIAGNOSIS — M549 Dorsalgia, unspecified: Secondary | ICD-10-CM | POA: Diagnosis not present

## 2022-06-03 DIAGNOSIS — I4891 Unspecified atrial fibrillation: Secondary | ICD-10-CM | POA: Diagnosis not present

## 2022-06-03 DIAGNOSIS — G9341 Metabolic encephalopathy: Secondary | ICD-10-CM | POA: Diagnosis not present

## 2022-06-03 DIAGNOSIS — I130 Hypertensive heart and chronic kidney disease with heart failure and stage 1 through stage 4 chronic kidney disease, or unspecified chronic kidney disease: Secondary | ICD-10-CM | POA: Diagnosis not present

## 2022-06-06 ENCOUNTER — Ambulatory Visit: Payer: Medicare Other | Admitting: Family Medicine

## 2022-06-06 DIAGNOSIS — N183 Chronic kidney disease, stage 3 unspecified: Secondary | ICD-10-CM | POA: Diagnosis not present

## 2022-06-06 DIAGNOSIS — I130 Hypertensive heart and chronic kidney disease with heart failure and stage 1 through stage 4 chronic kidney disease, or unspecified chronic kidney disease: Secondary | ICD-10-CM | POA: Diagnosis not present

## 2022-06-06 DIAGNOSIS — G9341 Metabolic encephalopathy: Secondary | ICD-10-CM | POA: Diagnosis not present

## 2022-06-06 DIAGNOSIS — M549 Dorsalgia, unspecified: Secondary | ICD-10-CM | POA: Diagnosis not present

## 2022-06-06 DIAGNOSIS — I5032 Chronic diastolic (congestive) heart failure: Secondary | ICD-10-CM | POA: Diagnosis not present

## 2022-06-06 DIAGNOSIS — I4891 Unspecified atrial fibrillation: Secondary | ICD-10-CM | POA: Diagnosis not present

## 2022-06-07 DIAGNOSIS — I4891 Unspecified atrial fibrillation: Secondary | ICD-10-CM | POA: Diagnosis not present

## 2022-06-07 DIAGNOSIS — I5032 Chronic diastolic (congestive) heart failure: Secondary | ICD-10-CM | POA: Diagnosis not present

## 2022-06-07 DIAGNOSIS — I130 Hypertensive heart and chronic kidney disease with heart failure and stage 1 through stage 4 chronic kidney disease, or unspecified chronic kidney disease: Secondary | ICD-10-CM | POA: Diagnosis not present

## 2022-06-07 DIAGNOSIS — M549 Dorsalgia, unspecified: Secondary | ICD-10-CM | POA: Diagnosis not present

## 2022-06-07 DIAGNOSIS — G9341 Metabolic encephalopathy: Secondary | ICD-10-CM | POA: Diagnosis not present

## 2022-06-07 DIAGNOSIS — N183 Chronic kidney disease, stage 3 unspecified: Secondary | ICD-10-CM | POA: Diagnosis not present

## 2022-06-08 DIAGNOSIS — G9341 Metabolic encephalopathy: Secondary | ICD-10-CM | POA: Diagnosis not present

## 2022-06-08 DIAGNOSIS — N183 Chronic kidney disease, stage 3 unspecified: Secondary | ICD-10-CM | POA: Diagnosis not present

## 2022-06-08 DIAGNOSIS — I5032 Chronic diastolic (congestive) heart failure: Secondary | ICD-10-CM | POA: Diagnosis not present

## 2022-06-08 DIAGNOSIS — I130 Hypertensive heart and chronic kidney disease with heart failure and stage 1 through stage 4 chronic kidney disease, or unspecified chronic kidney disease: Secondary | ICD-10-CM | POA: Diagnosis not present

## 2022-06-08 DIAGNOSIS — I4891 Unspecified atrial fibrillation: Secondary | ICD-10-CM | POA: Diagnosis not present

## 2022-06-08 DIAGNOSIS — M549 Dorsalgia, unspecified: Secondary | ICD-10-CM | POA: Diagnosis not present

## 2022-06-09 DIAGNOSIS — I130 Hypertensive heart and chronic kidney disease with heart failure and stage 1 through stage 4 chronic kidney disease, or unspecified chronic kidney disease: Secondary | ICD-10-CM | POA: Diagnosis not present

## 2022-06-09 DIAGNOSIS — M549 Dorsalgia, unspecified: Secondary | ICD-10-CM | POA: Diagnosis not present

## 2022-06-09 DIAGNOSIS — N183 Chronic kidney disease, stage 3 unspecified: Secondary | ICD-10-CM | POA: Diagnosis not present

## 2022-06-09 DIAGNOSIS — G9341 Metabolic encephalopathy: Secondary | ICD-10-CM | POA: Diagnosis not present

## 2022-06-09 DIAGNOSIS — I4891 Unspecified atrial fibrillation: Secondary | ICD-10-CM | POA: Diagnosis not present

## 2022-06-09 DIAGNOSIS — I5032 Chronic diastolic (congestive) heart failure: Secondary | ICD-10-CM | POA: Diagnosis not present

## 2022-06-13 DIAGNOSIS — I130 Hypertensive heart and chronic kidney disease with heart failure and stage 1 through stage 4 chronic kidney disease, or unspecified chronic kidney disease: Secondary | ICD-10-CM | POA: Diagnosis not present

## 2022-06-13 DIAGNOSIS — M549 Dorsalgia, unspecified: Secondary | ICD-10-CM | POA: Diagnosis not present

## 2022-06-13 DIAGNOSIS — N183 Chronic kidney disease, stage 3 unspecified: Secondary | ICD-10-CM | POA: Diagnosis not present

## 2022-06-13 DIAGNOSIS — G9341 Metabolic encephalopathy: Secondary | ICD-10-CM | POA: Diagnosis not present

## 2022-06-13 DIAGNOSIS — I4891 Unspecified atrial fibrillation: Secondary | ICD-10-CM | POA: Diagnosis not present

## 2022-06-13 DIAGNOSIS — I5032 Chronic diastolic (congestive) heart failure: Secondary | ICD-10-CM | POA: Diagnosis not present

## 2022-06-15 ENCOUNTER — Other Ambulatory Visit: Payer: Self-pay | Admitting: *Deleted

## 2022-06-15 ENCOUNTER — Ambulatory Visit (INDEPENDENT_AMBULATORY_CARE_PROVIDER_SITE_OTHER): Payer: Medicare Other | Admitting: Family Medicine

## 2022-06-15 VITALS — BP 138/73 | Ht 64.0 in | Wt 136.6 lb

## 2022-06-15 DIAGNOSIS — I6529 Occlusion and stenosis of unspecified carotid artery: Secondary | ICD-10-CM | POA: Diagnosis not present

## 2022-06-15 DIAGNOSIS — F09 Unspecified mental disorder due to known physiological condition: Secondary | ICD-10-CM

## 2022-06-15 DIAGNOSIS — R140 Abdominal distension (gaseous): Secondary | ICD-10-CM | POA: Diagnosis not present

## 2022-06-15 DIAGNOSIS — N1831 Chronic kidney disease, stage 3a: Secondary | ICD-10-CM

## 2022-06-15 DIAGNOSIS — I10 Essential (primary) hypertension: Secondary | ICD-10-CM | POA: Diagnosis not present

## 2022-06-15 MED ORDER — POTASSIUM CHLORIDE CRYS ER 10 MEQ PO TBCR: 10.0000 meq | EXTENDED_RELEASE_TABLET | Freq: Two times a day (BID) | ORAL | 2 refills | Status: DC

## 2022-06-15 MED ORDER — VITAMIN B-12 1000 MCG PO TABS: 1000.0000 ug | ORAL_TABLET | Freq: Every day | ORAL | 0 refills | Status: AC

## 2022-06-15 MED ORDER — PANTOPRAZOLE SODIUM 40 MG PO TBEC: 40.0000 mg | DELAYED_RELEASE_TABLET | Freq: Every day | ORAL | 2 refills | Status: DC

## 2022-06-15 MED ORDER — ALPRAZOLAM 0.25 MG PO TABS: ORAL_TABLET | ORAL | 2 refills | Status: DC

## 2022-06-15 MED ORDER — HYDROCHLOROTHIAZIDE 25 MG PO TABS: ORAL_TABLET | ORAL | 1 refills | Status: DC

## 2022-06-15 MED ORDER — RIVAROXABAN 15 MG PO TABS: ORAL_TABLET | ORAL | 1 refills | Status: DC

## 2022-06-15 NOTE — Progress Notes (Signed)
   Subjective:    Patient ID: Becky Baird, female    DOB: [birth year], 88 y.o.   MRN: [id number]  Hypertension This is a chronic problem. The current episode started more than 1 year ago. Risk factors for coronary artery disease include dyslipidemia. Treatments tried: hctz.   Patient overall is doing well Uses her cane to walk around has issues with instability but has not had any falls recently we did talk about using a walker but unfortunately her house does not accommodate a walker really well We also talked about being careful in the shower she states she is in the process of getting proper showers seat that would allow her to safely take shower she does have a handles  She does have chronic kidney disease more than likely related to age it has been stable recently but we will monitor it every several months  She does complain of intermittent bloating in the abdomen.  Denies any abdominal pain.  No rectal bleeding.  Has a family member who once had pancreatic cancer and did not do well-she denies any vomiting bloody stools.  Does not have any nocturnal pain.  Does not have any significant pain more so just bloating feeling. Review of Systems     Objective:   Physical Exam  General-in no acute distress Eyes-no discharge Lungs-respiratory rate normal, CTA CV-mild murmur noted,RRR Extremities skin warm dry no edema Neuro grossly normal Behavior normal, alert Her abdomen is soft there are no masses I find no evidence of ascites there is some slight protrusion but this could be related to age and habitus and less tone within the abdominal muscles      Assessment & Plan:  1. Abdominal bloating Hold off on any testing currently follow-up in 6 weeks if having progressive troubles or problems consideration for ultrasound and lab work or possible CAT scan also if this is progressing certainly will need work-up as well  2. Primary hypertension Blood pressure under good control continue  current measures  3. Cognitive dysfunction Patient is able to recall 3 items out of 3.  She is able to do numbers forward and backwards.  She is able to copy simple items but not complex items.  She is not able to do the numbers on a clock.  We did discuss how these type of issues are signs of mild cognitive dysfunction that can occur with age I do not feel she has dementia currently.  We did discuss safety with making decisions as well as proper finance decisions. Her family remains involved with these   4. Stage 3a chronic kidney disease ([place]) Recent lab work stable.  Continue current measures.  Patient does have underlying anxiety issues but denies being depressed.  She is on low-dose alprazolam 2 or 3/day she denies it causing drowsiness.  We have talked about trying to minimize or eliminate this.  It has been very difficult for her to get away from this she has been on this for a long span of time.  Family states that without this medicine she has a very difficult time  Once again we will see her back in 6 weeks

## 2022-06-17 DIAGNOSIS — M549 Dorsalgia, unspecified: Secondary | ICD-10-CM | POA: Diagnosis not present

## 2022-06-17 DIAGNOSIS — I130 Hypertensive heart and chronic kidney disease with heart failure and stage 1 through stage 4 chronic kidney disease, or unspecified chronic kidney disease: Secondary | ICD-10-CM | POA: Diagnosis not present

## 2022-06-17 DIAGNOSIS — I4891 Unspecified atrial fibrillation: Secondary | ICD-10-CM | POA: Diagnosis not present

## 2022-06-17 DIAGNOSIS — I5032 Chronic diastolic (congestive) heart failure: Secondary | ICD-10-CM | POA: Diagnosis not present

## 2022-06-17 DIAGNOSIS — N183 Chronic kidney disease, stage 3 unspecified: Secondary | ICD-10-CM | POA: Diagnosis not present

## 2022-06-17 DIAGNOSIS — G9341 Metabolic encephalopathy: Secondary | ICD-10-CM | POA: Diagnosis not present

## 2022-06-23 ENCOUNTER — Telehealth: Payer: Self-pay | Admitting: Family Medicine

## 2022-06-23 DIAGNOSIS — G9341 Metabolic encephalopathy: Secondary | ICD-10-CM | POA: Diagnosis not present

## 2022-06-23 DIAGNOSIS — I4891 Unspecified atrial fibrillation: Secondary | ICD-10-CM | POA: Diagnosis not present

## 2022-06-23 DIAGNOSIS — I5032 Chronic diastolic (congestive) heart failure: Secondary | ICD-10-CM | POA: Diagnosis not present

## 2022-06-23 DIAGNOSIS — M549 Dorsalgia, unspecified: Secondary | ICD-10-CM | POA: Diagnosis not present

## 2022-06-23 DIAGNOSIS — I130 Hypertensive heart and chronic kidney disease with heart failure and stage 1 through stage 4 chronic kidney disease, or unspecified chronic kidney disease: Secondary | ICD-10-CM | POA: Diagnosis not present

## 2022-06-23 DIAGNOSIS — N183 Chronic kidney disease, stage 3 unspecified: Secondary | ICD-10-CM | POA: Diagnosis not present

## 2022-06-23 NOTE — Telephone Encounter (Signed)
Patient declined the Medicare Wellness Visit with [facility]  Per daughter  "Causes lots of stress for [place]. Thanks"

## 2022-06-24 DIAGNOSIS — I4891 Unspecified atrial fibrillation: Secondary | ICD-10-CM | POA: Diagnosis not present

## 2022-06-24 DIAGNOSIS — M549 Dorsalgia, unspecified: Secondary | ICD-10-CM | POA: Diagnosis not present

## 2022-06-24 DIAGNOSIS — G9341 Metabolic encephalopathy: Secondary | ICD-10-CM | POA: Diagnosis not present

## 2022-06-24 DIAGNOSIS — N183 Chronic kidney disease, stage 3 unspecified: Secondary | ICD-10-CM | POA: Diagnosis not present

## 2022-06-24 DIAGNOSIS — I5032 Chronic diastolic (congestive) heart failure: Secondary | ICD-10-CM | POA: Diagnosis not present

## 2022-06-24 DIAGNOSIS — I130 Hypertensive heart and chronic kidney disease with heart failure and stage 1 through stage 4 chronic kidney disease, or unspecified chronic kidney disease: Secondary | ICD-10-CM | POA: Diagnosis not present

## 2022-06-25 DIAGNOSIS — F419 Anxiety disorder, unspecified: Secondary | ICD-10-CM | POA: Diagnosis not present

## 2022-06-25 DIAGNOSIS — Z9181 History of falling: Secondary | ICD-10-CM | POA: Diagnosis not present

## 2022-06-25 DIAGNOSIS — M549 Dorsalgia, unspecified: Secondary | ICD-10-CM | POA: Diagnosis not present

## 2022-06-25 DIAGNOSIS — I4891 Unspecified atrial fibrillation: Secondary | ICD-10-CM | POA: Diagnosis not present

## 2022-06-25 DIAGNOSIS — Z8673 Personal history of transient ischemic attack (TIA), and cerebral infarction without residual deficits: Secondary | ICD-10-CM | POA: Diagnosis not present

## 2022-06-25 DIAGNOSIS — K219 Gastro-esophageal reflux disease without esophagitis: Secondary | ICD-10-CM | POA: Diagnosis not present

## 2022-06-25 DIAGNOSIS — Z87440 Personal history of urinary (tract) infections: Secondary | ICD-10-CM | POA: Diagnosis not present

## 2022-06-25 DIAGNOSIS — G9341 Metabolic encephalopathy: Secondary | ICD-10-CM | POA: Diagnosis not present

## 2022-06-25 DIAGNOSIS — Z7901 Long term (current) use of anticoagulants: Secondary | ICD-10-CM | POA: Diagnosis not present

## 2022-06-25 DIAGNOSIS — I5032 Chronic diastolic (congestive) heart failure: Secondary | ICD-10-CM | POA: Diagnosis not present

## 2022-06-25 DIAGNOSIS — G8929 Other chronic pain: Secondary | ICD-10-CM | POA: Diagnosis not present

## 2022-06-25 DIAGNOSIS — N183 Chronic kidney disease, stage 3 unspecified: Secondary | ICD-10-CM | POA: Diagnosis not present

## 2022-06-25 DIAGNOSIS — I130 Hypertensive heart and chronic kidney disease with heart failure and stage 1 through stage 4 chronic kidney disease, or unspecified chronic kidney disease: Secondary | ICD-10-CM | POA: Diagnosis not present

## 2022-06-27 DIAGNOSIS — M549 Dorsalgia, unspecified: Secondary | ICD-10-CM | POA: Diagnosis not present

## 2022-06-27 DIAGNOSIS — G9341 Metabolic encephalopathy: Secondary | ICD-10-CM | POA: Diagnosis not present

## 2022-06-27 DIAGNOSIS — I130 Hypertensive heart and chronic kidney disease with heart failure and stage 1 through stage 4 chronic kidney disease, or unspecified chronic kidney disease: Secondary | ICD-10-CM | POA: Diagnosis not present

## 2022-06-27 DIAGNOSIS — I4891 Unspecified atrial fibrillation: Secondary | ICD-10-CM | POA: Diagnosis not present

## 2022-06-27 DIAGNOSIS — I5032 Chronic diastolic (congestive) heart failure: Secondary | ICD-10-CM | POA: Diagnosis not present

## 2022-06-27 DIAGNOSIS — N183 Chronic kidney disease, stage 3 unspecified: Secondary | ICD-10-CM | POA: Diagnosis not present

## 2022-07-06 DIAGNOSIS — G9341 Metabolic encephalopathy: Secondary | ICD-10-CM | POA: Diagnosis not present

## 2022-07-06 DIAGNOSIS — I130 Hypertensive heart and chronic kidney disease with heart failure and stage 1 through stage 4 chronic kidney disease, or unspecified chronic kidney disease: Secondary | ICD-10-CM | POA: Diagnosis not present

## 2022-07-06 DIAGNOSIS — I5032 Chronic diastolic (congestive) heart failure: Secondary | ICD-10-CM | POA: Diagnosis not present

## 2022-07-06 DIAGNOSIS — I4891 Unspecified atrial fibrillation: Secondary | ICD-10-CM | POA: Diagnosis not present

## 2022-07-06 DIAGNOSIS — N183 Chronic kidney disease, stage 3 unspecified: Secondary | ICD-10-CM | POA: Diagnosis not present

## 2022-07-06 DIAGNOSIS — M549 Dorsalgia, unspecified: Secondary | ICD-10-CM | POA: Diagnosis not present

## 2022-07-13 DIAGNOSIS — G9341 Metabolic encephalopathy: Secondary | ICD-10-CM | POA: Diagnosis not present

## 2022-07-13 DIAGNOSIS — I5032 Chronic diastolic (congestive) heart failure: Secondary | ICD-10-CM | POA: Diagnosis not present

## 2022-07-13 DIAGNOSIS — M549 Dorsalgia, unspecified: Secondary | ICD-10-CM | POA: Diagnosis not present

## 2022-07-13 DIAGNOSIS — I130 Hypertensive heart and chronic kidney disease with heart failure and stage 1 through stage 4 chronic kidney disease, or unspecified chronic kidney disease: Secondary | ICD-10-CM | POA: Diagnosis not present

## 2022-07-13 DIAGNOSIS — N183 Chronic kidney disease, stage 3 unspecified: Secondary | ICD-10-CM | POA: Diagnosis not present

## 2022-07-13 DIAGNOSIS — I4891 Unspecified atrial fibrillation: Secondary | ICD-10-CM | POA: Diagnosis not present

## 2022-08-03 DIAGNOSIS — I739 Peripheral vascular disease, unspecified: Secondary | ICD-10-CM | POA: Diagnosis not present

## 2022-08-03 DIAGNOSIS — M79672 Pain in left foot: Secondary | ICD-10-CM | POA: Diagnosis not present

## 2022-08-03 DIAGNOSIS — L110 Acquired keratosis follicularis: Secondary | ICD-10-CM | POA: Diagnosis not present

## 2022-08-03 DIAGNOSIS — M79671 Pain in right foot: Secondary | ICD-10-CM | POA: Diagnosis not present

## 2022-08-03 DIAGNOSIS — M79675 Pain in left toe(s): Secondary | ICD-10-CM | POA: Diagnosis not present

## 2022-08-03 DIAGNOSIS — M79674 Pain in right toe(s): Secondary | ICD-10-CM | POA: Diagnosis not present

## 2022-08-04 DIAGNOSIS — M549 Dorsalgia, unspecified: Secondary | ICD-10-CM

## 2022-08-04 DIAGNOSIS — I4891 Unspecified atrial fibrillation: Secondary | ICD-10-CM

## 2022-08-04 DIAGNOSIS — G8929 Other chronic pain: Secondary | ICD-10-CM

## 2022-08-04 DIAGNOSIS — I130 Hypertensive heart and chronic kidney disease with heart failure and stage 1 through stage 4 chronic kidney disease, or unspecified chronic kidney disease: Secondary | ICD-10-CM

## 2022-08-04 DIAGNOSIS — G9341 Metabolic encephalopathy: Secondary | ICD-10-CM

## 2022-08-04 DIAGNOSIS — I5032 Chronic diastolic (congestive) heart failure: Secondary | ICD-10-CM

## 2022-08-04 DIAGNOSIS — N183 Chronic kidney disease, stage 3 unspecified: Secondary | ICD-10-CM

## 2022-08-11 ENCOUNTER — Ambulatory Visit: Payer: Medicare Other | Admitting: Family Medicine

## 2022-08-23 ENCOUNTER — Ambulatory Visit (HOSPITAL_COMMUNITY)
Admission: RE | Admit: 2022-08-23 | Discharge: 2022-08-23 | Disposition: A | Discharge status: Home / Self Care | Payer: Medicare Other | Source: Ambulatory Visit | Attending: Family Medicine | Admitting: Family Medicine

## 2022-08-23 DIAGNOSIS — I480 Paroxysmal atrial fibrillation: Secondary | ICD-10-CM | POA: Insufficient documentation

## 2022-08-23 DIAGNOSIS — I350 Nonrheumatic aortic (valve) stenosis: Secondary | ICD-10-CM | POA: Diagnosis not present

## 2022-08-23 LAB — ECHOCARDIOGRAM COMPLETE
AR max vel: 1.31 cm2
AV Area VTI: 1.12 cm2
AV Area mean vel: 1.27 cm2
AV Mean grad: 19.3 mmHg
AV Peak grad: 31.4 mmHg
Ao pk vel: 2.8 m/s
Area-P 1/2: 3.08 cm2
P 1/2 time: 482 msec
S' Lateral: 3.4 cm

## 2022-08-23 NOTE — Progress Notes (Signed)
*  PRELIMINARY RESULTS* Echocardiogram 2D Echocardiogram has been performed.  Samuel Germany 08/23/2022, 2:07 PM

## 2022-08-24 ENCOUNTER — Telehealth: Payer: Self-pay | Admitting: Family Medicine

## 2022-08-24 NOTE — Telephone Encounter (Signed)
Pt contacted. Informed pt of PCP answer, pt states she would like [place] to call [name] and tell her because she would never remember all that. Contacted [name], verbalized understanding. [name] states pt is in rare form and her reasoning ability is slim to none; has been going on since Christmas. [name] made aware to call office if symptoms persist or worsen.

## 2022-08-24 NOTE — Telephone Encounter (Signed)
Pt calling in to receive results of echo that was completed yesterday. Please advise. Thank you

## 2022-08-24 NOTE — Telephone Encounter (Signed)
Nurses-me ejection fraction of the heart looks good there is calcification of the aortic valve but cardiologist has not issue their opinion at this point regarding their input regarding this valve calcification.  Hopefully within the next couple days they will issue there opinion  (This test was ordered by the cardiologist-therefore cardiology typically will call the patient once the cardiologist issues their opinion)

## 2022-09-16 ENCOUNTER — Ambulatory Visit (INDEPENDENT_AMBULATORY_CARE_PROVIDER_SITE_OTHER): Payer: Medicare Other | Admitting: Family Medicine

## 2022-09-16 ENCOUNTER — Other Ambulatory Visit: Payer: Self-pay | Admitting: Family Medicine

## 2022-09-16 ENCOUNTER — Encounter: Payer: Self-pay | Admitting: Family Medicine

## 2022-09-16 VITALS — BP 134/68 | HR 64 | Wt 138.4 lb

## 2022-09-16 DIAGNOSIS — I10 Essential (primary) hypertension: Secondary | ICD-10-CM | POA: Diagnosis not present

## 2022-09-16 DIAGNOSIS — F09 Unspecified mental disorder due to known physiological condition: Secondary | ICD-10-CM | POA: Diagnosis not present

## 2022-09-16 DIAGNOSIS — R7989 Other specified abnormal findings of blood chemistry: Secondary | ICD-10-CM | POA: Diagnosis not present

## 2022-09-16 MED ORDER — ALPRAZOLAM 0.25 MG PO TABS: ORAL_TABLET | ORAL | 2 refills | Status: DC

## 2022-09-16 NOTE — Progress Notes (Signed)
   Subjective:    Patient ID: Becky Baird, female    DOB: 1933/09/08, 89 y.o.   MRN: [id number]  HPI Patient arrives 8 week follow up.   Patient would like to discuss memory issues and brain fog.  She has had some times where she is very coherent and other times where she seems to be talking about different items and jumps from topic to topic or forgets why she is moving from room to room She denies any new unilateral numbness or weakness  Also patient would like to discuss Echo results.  Patient with aortic stenosis but stable cardiology recommends repeating it again in 1 years time  Primary hypertension  Cognitive dysfunction  Elevated serum creatinine  Review of Systems     Objective:   Physical Exam Lungs clear heart rate controlled extremities no edema skin warm dry  Lab work from October reviewed     Severy:  1. Primary hypertension Blood pressure decent control continue current measures watch diet  2. Cognitive dysfunction Significant cognitive dysfunction but still able to live by herself family helps her out with almost everything she does.  She should not be driving.  She does not drive.  I would not consider this Alzheimer's I would not recommend doing another MRI at this time.  Is possible mini strokes could be occurring but I do not find clinical evidence that this is the case right now I find no evidence of any major stroke Hold off on any medications Namenda and Aricept unlikely to be helpful   3. Elevated serum creatinine Stable currently  Patient does use alprazolam intermittently 2-3 times a day to help her with anxiousness she has been on this for years  Patient does take Xarelto for atrial fibs she will follow-up in 3 months

## 2022-10-12 DIAGNOSIS — I739 Peripheral vascular disease, unspecified: Secondary | ICD-10-CM | POA: Diagnosis not present

## 2022-10-12 DIAGNOSIS — M79671 Pain in right foot: Secondary | ICD-10-CM | POA: Diagnosis not present

## 2022-10-12 DIAGNOSIS — L110 Acquired keratosis follicularis: Secondary | ICD-10-CM | POA: Diagnosis not present

## 2022-10-12 DIAGNOSIS — M79675 Pain in left toe(s): Secondary | ICD-10-CM | POA: Diagnosis not present

## 2022-10-12 DIAGNOSIS — M79674 Pain in right toe(s): Secondary | ICD-10-CM | POA: Diagnosis not present

## 2022-10-12 DIAGNOSIS — M79672 Pain in left foot: Secondary | ICD-10-CM | POA: Diagnosis not present

## 2022-10-13 ENCOUNTER — Other Ambulatory Visit: Payer: Self-pay | Admitting: Family Medicine

## 2022-11-11 ENCOUNTER — Encounter: Payer: Self-pay | Admitting: Family Medicine

## 2022-11-11 ENCOUNTER — Telehealth: Payer: Self-pay | Admitting: *Deleted

## 2022-11-11 DIAGNOSIS — R251 Tremor, unspecified: Secondary | ICD-10-CM | POA: Diagnosis not present

## 2022-11-11 DIAGNOSIS — N3000 Acute cystitis without hematuria: Secondary | ICD-10-CM | POA: Diagnosis not present

## 2022-11-11 DIAGNOSIS — I5030 Unspecified diastolic (congestive) heart failure: Secondary | ICD-10-CM | POA: Diagnosis not present

## 2022-11-11 DIAGNOSIS — R079 Chest pain, unspecified: Secondary | ICD-10-CM | POA: Diagnosis not present

## 2022-11-11 DIAGNOSIS — I130 Hypertensive heart and chronic kidney disease with heart failure and stage 1 through stage 4 chronic kidney disease, or unspecified chronic kidney disease: Secondary | ICD-10-CM | POA: Diagnosis not present

## 2022-11-11 DIAGNOSIS — N3001 Acute cystitis with hematuria: Secondary | ICD-10-CM | POA: Diagnosis not present

## 2022-11-11 DIAGNOSIS — I480 Paroxysmal atrial fibrillation: Secondary | ICD-10-CM | POA: Diagnosis not present

## 2022-11-11 DIAGNOSIS — Z8673 Personal history of transient ischemic attack (TIA), and cerebral infarction without residual deficits: Secondary | ICD-10-CM | POA: Diagnosis not present

## 2022-11-11 DIAGNOSIS — E878 Other disorders of electrolyte and fluid balance, not elsewhere classified: Secondary | ICD-10-CM | POA: Diagnosis not present

## 2022-11-11 DIAGNOSIS — I10 Essential (primary) hypertension: Secondary | ICD-10-CM | POA: Diagnosis not present

## 2022-11-11 DIAGNOSIS — Z7901 Long term (current) use of anticoagulants: Secondary | ICD-10-CM | POA: Diagnosis not present

## 2022-11-11 DIAGNOSIS — Z792 Long term (current) use of antibiotics: Secondary | ICD-10-CM | POA: Diagnosis not present

## 2022-11-11 DIAGNOSIS — I5032 Chronic diastolic (congestive) heart failure: Secondary | ICD-10-CM | POA: Diagnosis not present

## 2022-11-11 DIAGNOSIS — R531 Weakness: Secondary | ICD-10-CM | POA: Diagnosis not present

## 2022-11-11 DIAGNOSIS — N183 Chronic kidney disease, stage 3 unspecified: Secondary | ICD-10-CM | POA: Diagnosis not present

## 2022-11-11 DIAGNOSIS — Z79899 Other long term (current) drug therapy: Secondary | ICD-10-CM | POA: Diagnosis not present

## 2022-11-11 DIAGNOSIS — R42 Dizziness and giddiness: Secondary | ICD-10-CM | POA: Diagnosis not present

## 2022-11-11 DIAGNOSIS — R4182 Altered mental status, unspecified: Secondary | ICD-10-CM | POA: Diagnosis not present

## 2022-11-11 NOTE — Telephone Encounter (Signed)
Becky Baird spoke with patient's family. Advised to take her to ED.

## 2022-11-11 NOTE — Telephone Encounter (Signed)
Spoke with daughter [name](DPR) and she states that the patient  does not want to go to the ER but they will try to convince her to go to the ER to get checked out. Advised the importance of quick response to these symptoms and daughter verbalized understanding and stated they will do their best to get her to go to the ER

## 2022-11-12 DIAGNOSIS — N3001 Acute cystitis with hematuria: Secondary | ICD-10-CM | POA: Diagnosis not present

## 2022-11-12 DIAGNOSIS — Z79899 Other long term (current) drug therapy: Secondary | ICD-10-CM | POA: Diagnosis not present

## 2022-11-12 DIAGNOSIS — Z7901 Long term (current) use of anticoagulants: Secondary | ICD-10-CM | POA: Diagnosis not present

## 2022-11-12 DIAGNOSIS — I480 Paroxysmal atrial fibrillation: Secondary | ICD-10-CM | POA: Diagnosis not present

## 2022-11-12 DIAGNOSIS — I5030 Unspecified diastolic (congestive) heart failure: Secondary | ICD-10-CM | POA: Diagnosis not present

## 2022-11-12 DIAGNOSIS — N183 Chronic kidney disease, stage 3 unspecified: Secondary | ICD-10-CM | POA: Diagnosis not present

## 2022-11-12 DIAGNOSIS — Z792 Long term (current) use of antibiotics: Secondary | ICD-10-CM | POA: Diagnosis not present

## 2022-11-12 DIAGNOSIS — R42 Dizziness and giddiness: Secondary | ICD-10-CM | POA: Diagnosis not present

## 2022-11-12 DIAGNOSIS — Z8673 Personal history of transient ischemic attack (TIA), and cerebral infarction without residual deficits: Secondary | ICD-10-CM | POA: Diagnosis not present

## 2022-11-12 DIAGNOSIS — I130 Hypertensive heart and chronic kidney disease with heart failure and stage 1 through stage 4 chronic kidney disease, or unspecified chronic kidney disease: Secondary | ICD-10-CM | POA: Diagnosis not present

## 2022-11-13 DIAGNOSIS — Z7901 Long term (current) use of anticoagulants: Secondary | ICD-10-CM | POA: Diagnosis not present

## 2022-11-13 DIAGNOSIS — N183 Chronic kidney disease, stage 3 unspecified: Secondary | ICD-10-CM | POA: Diagnosis not present

## 2022-11-13 DIAGNOSIS — I5030 Unspecified diastolic (congestive) heart failure: Secondary | ICD-10-CM | POA: Diagnosis not present

## 2022-11-13 DIAGNOSIS — N3001 Acute cystitis with hematuria: Secondary | ICD-10-CM | POA: Diagnosis not present

## 2022-11-13 DIAGNOSIS — Z792 Long term (current) use of antibiotics: Secondary | ICD-10-CM | POA: Diagnosis not present

## 2022-11-13 DIAGNOSIS — I130 Hypertensive heart and chronic kidney disease with heart failure and stage 1 through stage 4 chronic kidney disease, or unspecified chronic kidney disease: Secondary | ICD-10-CM | POA: Diagnosis not present

## 2022-11-13 DIAGNOSIS — Z79899 Other long term (current) drug therapy: Secondary | ICD-10-CM | POA: Diagnosis not present

## 2022-11-13 DIAGNOSIS — I480 Paroxysmal atrial fibrillation: Secondary | ICD-10-CM | POA: Diagnosis not present

## 2022-11-13 DIAGNOSIS — Z8673 Personal history of transient ischemic attack (TIA), and cerebral infarction without residual deficits: Secondary | ICD-10-CM | POA: Diagnosis not present

## 2022-11-14 DIAGNOSIS — Z7901 Long term (current) use of anticoagulants: Secondary | ICD-10-CM | POA: Diagnosis not present

## 2022-11-14 DIAGNOSIS — N3001 Acute cystitis with hematuria: Secondary | ICD-10-CM | POA: Diagnosis not present

## 2022-11-14 DIAGNOSIS — N183 Chronic kidney disease, stage 3 unspecified: Secondary | ICD-10-CM | POA: Diagnosis not present

## 2022-11-14 DIAGNOSIS — B9689 Other specified bacterial agents as the cause of diseases classified elsewhere: Secondary | ICD-10-CM | POA: Diagnosis not present

## 2022-11-14 DIAGNOSIS — R4182 Altered mental status, unspecified: Secondary | ICD-10-CM | POA: Diagnosis not present

## 2022-11-14 DIAGNOSIS — Z792 Long term (current) use of antibiotics: Secondary | ICD-10-CM | POA: Diagnosis not present

## 2022-11-14 DIAGNOSIS — I5032 Chronic diastolic (congestive) heart failure: Secondary | ICD-10-CM | POA: Diagnosis not present

## 2022-11-14 DIAGNOSIS — E878 Other disorders of electrolyte and fluid balance, not elsewhere classified: Secondary | ICD-10-CM | POA: Diagnosis not present

## 2022-11-14 DIAGNOSIS — Z8673 Personal history of transient ischemic attack (TIA), and cerebral infarction without residual deficits: Secondary | ICD-10-CM | POA: Diagnosis not present

## 2022-11-14 DIAGNOSIS — I480 Paroxysmal atrial fibrillation: Secondary | ICD-10-CM | POA: Diagnosis not present

## 2022-11-14 DIAGNOSIS — I130 Hypertensive heart and chronic kidney disease with heart failure and stage 1 through stage 4 chronic kidney disease, or unspecified chronic kidney disease: Secondary | ICD-10-CM | POA: Diagnosis not present

## 2022-11-14 NOTE — Telephone Encounter (Signed)
Her would recommend a follow-up sooner than the currently scheduled end of May I have no idea what my schedule is they can have a problem focused visit within the next 2 weeks if any emergent issue go to ER

## 2022-11-15 NOTE — Telephone Encounter (Signed)
Should be noted that the patient did go to the ER did have a head scan as well as lab work was diagnosed with a UTI head scan was negative.  They were encouraged to follow-up with their family doctor.  Therefore at this point I am sure the family will reach out if they need to follow-up sooner-thanks

## 2022-11-15 NOTE — Telephone Encounter (Signed)
Left message to return call 

## 2022-11-15 NOTE — Telephone Encounter (Signed)
Patient scheduled ER follow up :11/21/2022 at 3:10 PM  with Dr Lorin Picket

## 2022-11-21 ENCOUNTER — Ambulatory Visit (INDEPENDENT_AMBULATORY_CARE_PROVIDER_SITE_OTHER): Payer: Medicare Other | Admitting: Family Medicine

## 2022-11-21 VITALS — BP 179/75 | HR 69 | Ht 64.0 in | Wt 141.8 lb

## 2022-11-21 DIAGNOSIS — R7989 Other specified abnormal findings of blood chemistry: Secondary | ICD-10-CM | POA: Diagnosis not present

## 2022-11-21 DIAGNOSIS — N1831 Chronic kidney disease, stage 3a: Secondary | ICD-10-CM | POA: Diagnosis not present

## 2022-11-21 DIAGNOSIS — R8290 Unspecified abnormal findings in urine: Secondary | ICD-10-CM | POA: Diagnosis not present

## 2022-11-21 DIAGNOSIS — N3000 Acute cystitis without hematuria: Secondary | ICD-10-CM

## 2022-11-21 DIAGNOSIS — R0609 Other forms of dyspnea: Secondary | ICD-10-CM | POA: Diagnosis not present

## 2022-11-21 DIAGNOSIS — I10 Essential (primary) hypertension: Secondary | ICD-10-CM

## 2022-11-21 LAB — POCT URINALYSIS DIP (CLINITEK)
Nitrite, UA: NEGATIVE — AB
Spec Grav, UA: 1.015 (ref 1.010–1.025)

## 2022-11-21 MED ORDER — CEFDINIR 300 MG PO CAPS: 300.0000 mg | ORAL_CAPSULE | Freq: Two times a day (BID) | ORAL | 0 refills | Status: DC

## 2022-11-21 NOTE — Progress Notes (Signed)
   Subjective:    Patient ID: Becky Baird, female    DOB: 12-11-1933, 89 y.o.   MRN: [id number]  HPI Patient arrives today for hospital follow up Very complex hospital follow-up Patient was in the hospital at the [facility] because of UTI as well as ataxia confusion they thought she might of had a stroke Scan looked overall okay Lab work showed renal insufficiency Patient does relate some dysuria denies high fever chills sweats Family concerned about abdominal bloating apparently tender at home but not tender here   Review of Systems     Objective:   Physical Exam General-in no acute distress Eyes-no discharge Lungs-respiratory rate normal, CTA CV-no murmurs,RRR Extremities skin warm dry no edema Neuro grossly normal Behavior normal, alert  Abdomen is soft no guarding rebound or masses felt      Assessment & Plan:  1. Elevated serum creatinine Challenging issues Keep well-hydrated Check metabolic 7 - Basic Metabolic Panel (7) - Urine Culture - POCT URINALYSIS DIP (CLINITEK)  2. Hypomagnesemia Results await results - Magnesium  3. DOE (dyspnea on exertion) Await results no evidence of CHF currently BNP was significantly elevated while in the hospital - Brain natriuretic peptide  4. Stage 3a chronic kidney disease (HCC) Check lab work to see what creatinine is doing - Basic Metabolic Panel (7) - Urine Culture - POCT URINALYSIS DIP (CLINITEK)  5. Unspecified abnormal findings in urine Urine culture sent WBCs under microscope - Urine Culture  6. Primary hypertension Elevated healthy diet continue medication follow-up in several weeks to recheck  UTI-antibiotic prescribed Omnicef twice daily for 7 days await culture  Did discuss end-of-life issues.  Family is looking over end-of-life discussion papers and will bring this back and they will fill it out as best he can between now and next visit

## 2022-11-22 LAB — BASIC METABOLIC PANEL (7)
BUN: 25 mg/dL (ref 8–27)
Potassium: 4.9 mmol/L (ref 3.5–5.2)
eGFR: 38 mL/min/{1.73_m2} — ABNORMAL LOW (ref >59)

## 2022-11-23 ENCOUNTER — Encounter: Payer: Self-pay | Admitting: Family Medicine

## 2022-11-23 LAB — BASIC METABOLIC PANEL (7)
BUN/Creatinine Ratio: 19 (ref 12–28)
CO2: 17 mmol/L — ABNORMAL LOW (ref 20–29)
Chloride: 91 mmol/L — ABNORMAL LOW (ref 96–106)
Creatinine, Ser: 1.35 mg/dL — ABNORMAL HIGH (ref 0.57–1.00)
Glucose: 103 mg/dL — ABNORMAL HIGH (ref 70–99)
Sodium: 127 mmol/L — ABNORMAL LOW (ref 134–144)

## 2022-11-23 LAB — MAGNESIUM: Magnesium: 2 mg/dL (ref 1.6–2.3)

## 2022-11-23 LAB — URINE CULTURE

## 2022-11-23 LAB — BRAIN NATRIURETIC PEPTIDE: BNP: 196.2 pg/mL — ABNORMAL HIGH (ref 0.0–100.0)

## 2022-11-24 NOTE — Telephone Encounter (Signed)
Nurses Please order up the labs and urine that I recommended in my message on lab results I would recommend that certainly that excessive water could have been the cause of this but if it still would be wise to do these tests they could be completed somewhere in 7 to 14 days  And also avoiding overhydration-as a rule of thumb we would recommend limiting fluid intake to 1500 mL daily until the follow-up labs and then if the follow-up labs look good we can allow for regular fluid intake

## 2022-11-25 ENCOUNTER — Telehealth: Payer: Self-pay

## 2022-11-25 ENCOUNTER — Other Ambulatory Visit: Payer: Self-pay

## 2022-11-25 DIAGNOSIS — I10 Essential (primary) hypertension: Secondary | ICD-10-CM

## 2022-11-25 DIAGNOSIS — N289 Disorder of kidney and ureter, unspecified: Secondary | ICD-10-CM

## 2022-11-25 LAB — URINE CULTURE

## 2022-11-25 LAB — SPECIMEN STATUS REPORT

## 2022-11-25 NOTE — Telephone Encounter (Signed)
Patient daughter Rosey [facility]) advised per Dr Lorin Picket please communicate with family-sodium is low.  It is important to make sure that she is not over hydrating with water.  I would limit total fluid intake to 1500 mL or less per day.  Repeat metabolic 7 and urine osmolality, serum osmolality, urine sodium  Urine culture pending  Patient's daughter verbalized understanding,  Patient's daughter wanted to know when to have labs done?

## 2022-11-25 NOTE — Addendum Note (Signed)
Addended by: Elizbeth Squires on: 11/25/2022 04:27 PM   Modules accepted: Orders

## 2022-11-26 NOTE — Telephone Encounter (Signed)
I would recommend doing this lab work in approximately 1 week's time which would be May 13

## 2022-11-28 ENCOUNTER — Other Ambulatory Visit: Payer: Self-pay | Admitting: *Deleted

## 2022-11-28 DIAGNOSIS — N289 Disorder of kidney and ureter, unspecified: Secondary | ICD-10-CM

## 2022-11-28 DIAGNOSIS — R7989 Other specified abnormal findings of blood chemistry: Secondary | ICD-10-CM

## 2022-11-28 NOTE — Telephone Encounter (Signed)
See my chart message- patient notified via my chart

## 2022-11-30 DIAGNOSIS — I739 Peripheral vascular disease, unspecified: Secondary | ICD-10-CM | POA: Diagnosis not present

## 2022-11-30 DIAGNOSIS — M79675 Pain in left toe(s): Secondary | ICD-10-CM | POA: Diagnosis not present

## 2022-11-30 DIAGNOSIS — M79672 Pain in left foot: Secondary | ICD-10-CM | POA: Diagnosis not present

## 2022-11-30 DIAGNOSIS — L110 Acquired keratosis follicularis: Secondary | ICD-10-CM | POA: Diagnosis not present

## 2022-12-06 ENCOUNTER — Encounter: Payer: Self-pay | Admitting: Family Medicine

## 2022-12-07 ENCOUNTER — Other Ambulatory Visit: Payer: Self-pay | Admitting: Family Medicine

## 2022-12-07 MED ORDER — ALPRAZOLAM 0.25 MG PO TABS: ORAL_TABLET | ORAL | 2 refills | Status: DC

## 2022-12-09 DIAGNOSIS — N289 Disorder of kidney and ureter, unspecified: Secondary | ICD-10-CM | POA: Diagnosis not present

## 2022-12-09 DIAGNOSIS — R7989 Other specified abnormal findings of blood chemistry: Secondary | ICD-10-CM | POA: Diagnosis not present

## 2022-12-11 LAB — BASIC METABOLIC PANEL WITH GFR
BUN/Creatinine Ratio: 19 (ref 12–28)
BUN: 30 mg/dL — ABNORMAL HIGH (ref 8–27)
CO2: 23 mmol/L (ref 20–29)
Calcium: 10.3 mg/dL (ref 8.7–10.3)
Chloride: 102 mmol/L (ref 96–106)
Creatinine, Ser: 1.59 mg/dL — ABNORMAL HIGH (ref 0.57–1.00)
Glucose: 105 mg/dL — ABNORMAL HIGH (ref 70–99)
Potassium: 4.6 mmol/L (ref 3.5–5.2)
Sodium: 140 mmol/L (ref 134–144)
eGFR: 31 mL/min/1.73 — ABNORMAL LOW

## 2022-12-11 LAB — OSMOLALITY: Osmolality Meas: 290 mOsmol/kg (ref 280–301)

## 2022-12-14 DIAGNOSIS — N390 Urinary tract infection, site not specified: Secondary | ICD-10-CM | POA: Diagnosis not present

## 2022-12-14 DIAGNOSIS — Z4689 Encounter for fitting and adjustment of other specified devices: Secondary | ICD-10-CM | POA: Diagnosis not present

## 2022-12-14 DIAGNOSIS — N8110 Cystocele, unspecified: Secondary | ICD-10-CM | POA: Diagnosis not present

## 2022-12-14 DIAGNOSIS — L900 Lichen sclerosus et atrophicus: Secondary | ICD-10-CM | POA: Diagnosis not present

## 2022-12-20 ENCOUNTER — Ambulatory Visit (INDEPENDENT_AMBULATORY_CARE_PROVIDER_SITE_OTHER): Payer: Medicare Other | Admitting: Family Medicine

## 2022-12-20 VITALS — BP 135/72 | HR 75 | Wt 131.0 lb

## 2022-12-20 DIAGNOSIS — I10 Essential (primary) hypertension: Secondary | ICD-10-CM | POA: Diagnosis not present

## 2022-12-20 DIAGNOSIS — R1084 Generalized abdominal pain: Secondary | ICD-10-CM | POA: Diagnosis not present

## 2022-12-20 DIAGNOSIS — M791 Myalgia, unspecified site: Secondary | ICD-10-CM | POA: Diagnosis not present

## 2022-12-20 DIAGNOSIS — I700 Atherosclerosis of aorta: Secondary | ICD-10-CM

## 2022-12-20 DIAGNOSIS — F411 Generalized anxiety disorder: Secondary | ICD-10-CM

## 2022-12-20 DIAGNOSIS — N390 Urinary tract infection, site not specified: Secondary | ICD-10-CM

## 2022-12-20 DIAGNOSIS — N1832 Chronic kidney disease, stage 3b: Secondary | ICD-10-CM | POA: Diagnosis not present

## 2022-12-20 NOTE — Progress Notes (Addendum)
Subjective:    Patient ID: Becky Baird, female    DOB: 1933/11/18, 89 y.o.   MRN: [id number]  HPI Patient arrives today for 3 month follow up.   Patient is having abdominal discomfort on lower left side. She describes intermittent abdominal pain over the past week no high fever chills or sweats no vomiting no diarrhea Previous hyponatremia has resolved Most recent metabolic 7 looks good Lab work and office visit from gynecology urology was reviewed Lab work does show CKD 3B Stage 3b chronic kidney disease (HCC)  Frequent UTI  Primary hypertension  Generalized abdominal pain  GAD (generalized anxiety disorder)  Patient does states she takes Xanax she feels it helps her it does not cause her to feel drowsy she does not feel she can function without it Denies being depressed  Results for orders placed or performed in visit on 11/28/22  Osmolality  Result Value Ref Range   Osmolality Meas 290 280 - 301 mOsmol/kg  Basic metabolic panel  Result Value Ref Range   Glucose 105 (H) 70 - 99 mg/dL   BUN 30 (H) 8 - 27 mg/dL   Creatinine, Ser 4.09 (H) 0.57 - 1.00 mg/dL   eGFR 31 (L) >81 XB/JYN/8.29   BUN/Creatinine Ratio 19 12 - 28   Sodium 140 134 - 144 mmol/L   Potassium 4.6 3.5 - 5.2 mmol/L   Chloride 102 96 - 106 mmol/L   CO2 23 20 - 29 mmol/L   Calcium 10.3 8.7 - 10.3 mg/dL   Outpatient Encounter Medications as of 12/20/2022  Medication Sig Note   ALPRAZolam (XANAX) 0.25 MG tablet 1 tid prn    clobetasol ointment (TEMOVATE) 0.05 % Apply 1 application topically 2 (two) times daily as needed.    cyanocobalamin (VITAMIN B12) 1000 MCG tablet Take 1 tablet (1,000 mcg total) by mouth daily.    estradiol (ESTRACE) 0.1 MG/GM vaginal cream Place 1 Applicatorful vaginally once a week. 06/21/2021: Using Mondays and Fridays   hydrochlorothiazide (HYDRODIURIL) 25 MG tablet TAKE ONE TABLET BY MOUTH ONCE DAILY **STOP AMLODIPINE**    Ketotifen Fumarate (ALAWAY OP) Apply 1 drop to eye  daily as needed. 06/08/2021: PRN    loratadine (CLARITIN) 10 MG tablet Take 1 tablet (10 mg total) by mouth daily. (Patient taking differently: Take 10 mg by mouth at bedtime.)    OVER THE COUNTER MEDICATION Take 1 capsule by mouth every morning. SUPREMA DOPHILUS    OVER THE COUNTER MEDICATION Take by mouth daily. Vitamin D3 2,000 units daily    pantoprazole (PROTONIX) 40 MG tablet TAKE ONE TABLET BY MOUTH ONCE DAILY    potassium chloride (KLOR-CON M) 10 MEQ tablet TAKE ONE TABLET BY MOUTH TWICE DAILY    Rivaroxaban (XARELTO) 15 MG TABS tablet TAKE ONE TABLET BY MOUTH DAILY WITH SUPPER    sodium chloride (OCEAN) 0.65 % nasal spray Place 1 spray into the nose as needed for congestion.     [DISCONTINUED] cefdinir (OMNICEF) 300 MG capsule Take 1 capsule (300 mg total) by mouth 2 (two) times daily.    No facility-administered encounter medications on file as of 12/20/2022.     Review of Systems     Objective:   Physical Exam General-in no acute distress Eyes-no discharge Lungs-respiratory rate normal, CTA CV-no murmurs,RRR Extremities skin warm dry no edema Neuro grossly normal Behavior normal, alert On abdominal exam she has minimal tenderness along the left mid abdomen no guarding or rebound       Assessment & Plan:  1. Stage 3b chronic kidney disease (HCC) Importance of adequate hydration and healthy eating was discussed Patient has been seen by nephrology in the past but they basically have stated that there is not much they can do for her other than monitor this.  Therefore regular follow-ups with [place].  If the condition gets worse we will send them back to nephrology blood pressure under good control  2. Frequent UTI Will be seeing urology in the near future to get their input  3. Primary hypertension Blood pressure under good control currently.  4. Generalized abdominal pain Recommend holding off on any type of scans currently.  Has previous history of polyps but at her  age would not recommend putting her through another colonoscopy.  Patient will do lab work before her follow-up visit.  If she feels that her discomfort is worsening or not improving over the course of the next week or 2 she is to notify us and we will push forward with further testing As for previous CBC done several weeks ago no anemia noted  5. GAD (generalized anxiety disorder) Low-dose Xanax 3 times daily caution drowsiness continue current measures  Recheck her again in approximately 2 months time to make sure that the abdomen is doing okay  History of aortic atherosclerosis does not tolerate statins

## 2022-12-23 DIAGNOSIS — N3001 Acute cystitis with hematuria: Secondary | ICD-10-CM | POA: Diagnosis not present

## 2022-12-23 DIAGNOSIS — Z6822 Body mass index (BMI) 22.0-22.9, adult: Secondary | ICD-10-CM | POA: Diagnosis not present

## 2022-12-23 DIAGNOSIS — R030 Elevated blood-pressure reading, without diagnosis of hypertension: Secondary | ICD-10-CM | POA: Diagnosis not present

## 2023-01-04 ENCOUNTER — Other Ambulatory Visit: Payer: Self-pay | Admitting: Family Medicine

## 2023-01-05 ENCOUNTER — Encounter: Payer: Self-pay | Admitting: Urology

## 2023-01-05 ENCOUNTER — Ambulatory Visit (INDEPENDENT_AMBULATORY_CARE_PROVIDER_SITE_OTHER): Payer: Medicare Other | Admitting: Urology

## 2023-01-05 VITALS — BP 153/64 | HR 67 | Ht 64.0 in | Wt 131.0 lb

## 2023-01-05 DIAGNOSIS — N952 Postmenopausal atrophic vaginitis: Secondary | ICD-10-CM | POA: Diagnosis not present

## 2023-01-05 DIAGNOSIS — Z87440 Personal history of urinary (tract) infections: Secondary | ICD-10-CM

## 2023-01-05 DIAGNOSIS — N3941 Urge incontinence: Secondary | ICD-10-CM | POA: Diagnosis not present

## 2023-01-05 DIAGNOSIS — N390 Urinary tract infection, site not specified: Secondary | ICD-10-CM

## 2023-01-05 LAB — BLADDER SCAN AMB NON-IMAGING: Scan Result: 144

## 2023-01-05 MED ORDER — FOSFOMYCIN TROMETHAMINE 3 G PO PACK
PACK | ORAL | 1 refills | Status: DC
Start: 2023-01-05 — End: 2023-05-16

## 2023-01-05 NOTE — Progress Notes (Signed)
Subjective: 1. Recurrent UTI   2. Urge incontinence   3. Vaginal atrophy      Consult requested by Dr. Lilyan Punt   Becky Baird is an 89 yo female who is sent by Dr. Gerda Diss for recurrent UTI's with sepsis in 9/19 at [facility].  She has a history of prolapse that is managed with a pessary that was last cleaned on 12/14/22.   She had vaginal atrophy and has been started on topical estrogen by her gynecologist.  Her most recent culture in April grew pseudomonas on 4/29 but her culture on 4/19 grew Mx species and she had a few yeast in the urine. She was admitted at that time for MS changed.  She had severe SP pain when she was admitted.   She has had e. Coli in the past on cultures in 2023, 2021 and 2020.   Her PVR is .  When she has a UTI she has frequency and urgency with UUI's.  She had symptoms yesterday and is tender.  She does have some SUI.  She does wear a pad.   She has had no hematuria.   She has CKD4 with a Cr of 1.59.     ROS:  Review of Systems  Respiratory:  Positive for shortness of breath.   Cardiovascular:  Positive for leg swelling.  Gastrointestinal:  Positive for constipation, diarrhea and nausea.  Musculoskeletal:  Positive for back pain and joint pain.  Neurological:  Positive for weakness.  Psychiatric/Behavioral:  Positive for memory loss. The patient is nervous/anxious.   All other systems reviewed and are negative.   Allergies  Allergen Reactions   Amlodipine     Severe pedal edema with 2.5 mg use   Statins Other (See Comments)    Myalgias with rosuvastatin   Neurontin [Gabapentin]     Cognitive issues, creates fogginess   Niaspan [Niacin Er] Rash   Oxybutynin     Nausea sweats    Past Medical History:  Diagnosis Date   Anxiety    Chronic back pain    Hyperlipidemia    Hypertension    Memory loss    Mild cognitive impairment, so stated    Occlusion and stenosis of carotid artery with cerebral infarction    Bilateral less than 50% carotid stenosis.    Persistent atrial fibrillation (HCC) 06/23/2016   Renal cyst    Sciatica    Stroke (HCC)    2010, RMCA with stent   Toe fracture 11/2015   R toe   Ulcer 1970s    Past Surgical History:  Procedure Laterality Date   BIOPSY  04/25/2018   Procedure: BIOPSY;  Surgeon: Malissa Hippo, MD;  Location: [facility];  Service: Endoscopy;;  gastric polyps   COLONOSCOPY  03/10/2011   Procedure: COLONOSCOPY;  Surgeon: Malissa Hippo, MD;  Location: [facility];  Service: Endoscopy;  Laterality: N/A;   COLONOSCOPY N/A 08/24/2016   Procedure: COLONOSCOPY;  Surgeon: Malissa Hippo, MD;  Location: [facility];  Service: Endoscopy;  Laterality: N/A;  930   DILATION AND CURETTAGE OF UTERUS     years ago   ESOPHAGOGASTRODUODENOSCOPY N/A 04/25/2018   Procedure: ESOPHAGOGASTRODUODENOSCOPY (EGD);  Surgeon: Malissa Hippo, MD;  Location: [facility];  Service: Endoscopy;  Laterality: N/A;  1:15   OVARIAN CYST REMOVAL     at least 10 years ago   POLYPECTOMY  08/24/2016   Procedure: POLYPECTOMY;  Surgeon: Malissa Hippo, MD;  Location: [facility];  Service: Endoscopy;;  ascending colon; hepatic flexure   TONSILLECTOMY     childhood    Social History   Socioeconomic History   Marital status: Widowed    Spouse name: Not on file   Number of children: 2   Years of education: College   Highest education level: 12th grade  Occupational History    Employer: RETIRED  Tobacco Use   Smoking status: Never   Smokeless tobacco: Never  Vaping Use   Vaping Use: Never used  Substance and Sexual Activity   Alcohol use: No    Alcohol/week: 0.0 standard drinks of alcohol   Drug use: No   Sexual activity: Not on file  Other Topics Concern   Not on file  Social History Narrative   Patient lives at home alone.    2 "wonderful" daughters per pt.    3 grandchildren.   Caffeine Use: very little, rarely   Social Determinants of Health   Financial Resource Strain: Low Risk  (06/08/2021)   Overall  Financial Resource Strain (CARDIA)    Difficulty of Paying Living Expenses: Not hard at all  Food Insecurity: No Food Insecurity (06/08/2021)   Hunger Vital Sign    Worried About Running Out of Food in the Last Year: Never true    Ran Out of Food in the Last Year: Never true  Transportation Needs: No Transportation Needs (06/08/2021)   PRAPARE - Administrator, Civil Service (Medical): No    Lack of Transportation (Non-Medical): No  Physical Activity: Insufficiently Active (06/08/2021)   Exercise Vital Sign    Days of Exercise per Week: 5 days    Minutes of Exercise per Session: 20 min  Stress: No Stress Concern Present (06/08/2021)   [employer] of Occupational Health - Occupational Stress Questionnaire    Feeling of Stress : Not at all  Social Connections: Socially Isolated (06/08/2021)   Social Connection and Isolation Panel [NHANES]    Frequency of Communication with Friends and Family: More than three times a week    Frequency of Social Gatherings with Friends and Family: More than three times a week    Attends Religious Services: Never    Database administrator or Organizations: No    Attends Banker Meetings: Never    Marital Status: Widowed  Intimate Partner Violence: Not At Risk (06/08/2021)   Humiliation, Afraid, Rape, and Kick questionnaire    Fear of Current or Ex-Partner: No    Emotionally Abused: No    Physically Abused: No    Sexually Abused: No    Family History  Problem Relation Age of Onset   Stroke Mother    Heart failure Father    Diabetes Brother     Anti-infectives: Anti-infectives (From admission, onward)    Start     Dose/Rate Route Frequency Ordered Stop   01/05/23 0000  fosfomycin (MONUROL) 3 g PACK           01/05/23 1538         Current Outpatient Medications  Medication Sig Dispense Refill   ALPRAZolam (XANAX) 0.25 MG tablet 1 tid prn 90 tablet 2   clobetasol ointment (TEMOVATE) 0.05 % Apply 1 application  topically 2 (two) times daily as needed.     cyanocobalamin (VITAMIN B12) 1000 MCG tablet Take 1 tablet (1,000 mcg total) by mouth daily. 30 tablet 0   estradiol (ESTRACE) 0.1 MG/GM vaginal cream Place 1 Applicatorful vaginally once a week.     fosfomycin (  MONUROL) 3 g PACK Mix one packet in 8oz of fluid and take po q3days. 9 g 1   hydrochlorothiazide (HYDRODIURIL) 25 MG tablet TAKE ONE TABLET BY MOUTH ONCE DAILY **STOP AMLODIPINE** 90 tablet 1   Ketotifen Fumarate (ALAWAY OP) Apply 1 drop to eye daily as needed.     loratadine (CLARITIN) 10 MG tablet Take 1 tablet (10 mg total) by mouth daily. (Patient taking differently: Take 10 mg by mouth at bedtime.) 30 tablet 5   OVER THE COUNTER MEDICATION Take 1 capsule by mouth every morning. SUPREMA DOPHILUS     OVER THE COUNTER MEDICATION Take by mouth daily. Vitamin D3 2,000 units daily     pantoprazole (PROTONIX) 40 MG tablet take 1 tablet by mouth daily 30 tablet 0   potassium chloride (KLOR-CON M) 10 MEQ tablet TAKE ONE TABLET BY MOUTH TWICE DAILY 60 tablet 2   Rivaroxaban (XARELTO) 15 MG TABS tablet TAKE ONE TABLET BY MOUTH DAILY WITH SUPPER 90 tablet 1   sodium chloride (OCEAN) 0.65 % nasal spray Place 1 spray into the nose as needed for congestion.      No current facility-administered medications for this visit.     Objective: Vital signs in last 24 hours: BP (!) 153/64   Pulse 67   Ht 5\' 4"  (1.626 m)   Wt 131 lb (59.4 kg)   BMI 22.49 kg/m   Intake/Output from previous day: No intake/output data recorded. Intake/Output this shift: @IOTHISSHIFT @   Physical Exam Vitals reviewed.  Constitutional:      Appearance: Normal appearance.  Neurological:     Mental Status: She is alert.     Lab Results:  No results found for this or any previous visit (from the past 24 hour(s)).  BMET No results for input(s): "NA", "K", "CL", "CO2", "GLUCOSE", "BUN", "CREATININE", "CALCIUM" in the last 72 hours. PT/INR No results for input(s):  "LABPROT", "INR" in the last 72 hours. ABG No results for input(s): "PHART", "HCO3" in the last 72 hours.  Invalid input(s): "PCO2", "PO2" UA mod bacteria and 11-30 WBC on cath specimen Studies/Results: No results found.   Assessment/Plan: Recurrent UTI's.  I will culture the urine today and consider suppression with the results but will start her on fosfomycin..   UUI.  I discussed Leslye Peer but we will hold off on that.  Elevated PVR.  Her PVR is .   Meds ordered this encounter  Medications   fosfomycin (MONUROL) 3 g PACK    Sig: Mix one packet in 8oz of fluid and take po q3days.    Dispense:  9 g    Refill:  1     Orders Placed This Encounter  Procedures   Urine Culture   Microscopic Examination   Urinalysis, Routine w reflex microscopic   In and Out Cath   BLADDER SCAN AMB NON-IMAGING     Return in about 4 weeks (around 02/02/2023) for cath UA and OV with Sarah. .    CC: Dr. Lilyan Punt.      Bjorn Pippin 01/07/2023

## 2023-01-05 NOTE — Progress Notes (Addendum)
post void residual =1109mL  Pt is prepped for and in and out catherization. Patient was cleaned and prepped in a sterle fashion with betadine. A 14 fr catheter foley was inserted. Urine return was note 180 ml.  Performed by Guss Bunde, CMA  Urine sent for culture

## 2023-01-06 LAB — URINALYSIS, ROUTINE W REFLEX MICROSCOPIC
Bilirubin, UA: NEGATIVE
Glucose, UA: NEGATIVE
Ketones, UA: NEGATIVE
Nitrite, UA: NEGATIVE
Protein,UA: NEGATIVE
RBC, UA: NEGATIVE
Specific Gravity, UA: 1.01 (ref 1.005–1.030)
Urobilinogen, Ur: 0.2 mg/dL (ref 0.2–1.0)
pH, UA: 6 (ref 5.0–7.5)

## 2023-01-06 LAB — MICROSCOPIC EXAMINATION

## 2023-01-11 DIAGNOSIS — M79674 Pain in right toe(s): Secondary | ICD-10-CM | POA: Diagnosis not present

## 2023-01-11 DIAGNOSIS — I739 Peripheral vascular disease, unspecified: Secondary | ICD-10-CM | POA: Diagnosis not present

## 2023-01-11 DIAGNOSIS — M79671 Pain in right foot: Secondary | ICD-10-CM | POA: Diagnosis not present

## 2023-01-11 DIAGNOSIS — M79672 Pain in left foot: Secondary | ICD-10-CM | POA: Diagnosis not present

## 2023-01-11 DIAGNOSIS — M79675 Pain in left toe(s): Secondary | ICD-10-CM | POA: Diagnosis not present

## 2023-01-11 DIAGNOSIS — L110 Acquired keratosis follicularis: Secondary | ICD-10-CM | POA: Diagnosis not present

## 2023-01-11 LAB — URINE CULTURE

## 2023-01-12 ENCOUNTER — Telehealth: Payer: Self-pay

## 2023-01-12 NOTE — Telephone Encounter (Signed)
-----   Message from Bjorn Pippin, MD sent at 01/11/2023  7:53 PM EDT ----- She should continue the fosfomycin and return to the office in 1-2 weeks for a repeat UA and possible culture with Maralyn Sago ----- Message ----- From: Grier Rocher, CMA Sent: 01/11/2023   3:44 PM EDT To: Bjorn Pippin, MD  Please review, fosfomycin sent 06/14

## 2023-01-12 NOTE — Telephone Encounter (Signed)
Left a voicemail requesting a call back for results and to confirm f/u with Sarah on 07/12 at 0830 apt.

## 2023-01-16 ENCOUNTER — Other Ambulatory Visit: Payer: Self-pay | Admitting: Family Medicine

## 2023-01-16 NOTE — Telephone Encounter (Signed)
My chart message sent.  Patient also aware of response and MD recommendation.

## 2023-02-03 ENCOUNTER — Ambulatory Visit: Payer: Medicare Other | Admitting: Urology

## 2023-02-07 ENCOUNTER — Encounter: Payer: Self-pay | Admitting: Urology

## 2023-02-07 ENCOUNTER — Ambulatory Visit: Payer: Medicare Other | Admitting: Urology

## 2023-02-07 VITALS — BP 148/69 | HR 65 | Temp 98.6°F

## 2023-02-07 DIAGNOSIS — N3941 Urge incontinence: Secondary | ICD-10-CM

## 2023-02-07 DIAGNOSIS — R8271 Bacteriuria: Secondary | ICD-10-CM | POA: Diagnosis not present

## 2023-02-07 DIAGNOSIS — N904 Leukoplakia of vulva: Secondary | ICD-10-CM

## 2023-02-07 DIAGNOSIS — N819 Female genital prolapse, unspecified: Secondary | ICD-10-CM | POA: Diagnosis not present

## 2023-02-07 DIAGNOSIS — N390 Urinary tract infection, site not specified: Secondary | ICD-10-CM | POA: Diagnosis not present

## 2023-02-07 DIAGNOSIS — Z960 Presence of urogenital implants: Secondary | ICD-10-CM | POA: Diagnosis not present

## 2023-02-07 DIAGNOSIS — N952 Postmenopausal atrophic vaginitis: Secondary | ICD-10-CM

## 2023-02-07 DIAGNOSIS — Z87440 Personal history of urinary (tract) infections: Secondary | ICD-10-CM

## 2023-02-07 DIAGNOSIS — N3946 Mixed incontinence: Secondary | ICD-10-CM | POA: Diagnosis not present

## 2023-02-07 LAB — URINALYSIS, ROUTINE W REFLEX MICROSCOPIC
Bilirubin, UA: NEGATIVE
Glucose, UA: NEGATIVE
Ketones, UA: NEGATIVE
Nitrite, UA: POSITIVE — AB
Protein,UA: NEGATIVE
Specific Gravity, UA: 1.01 (ref 1.005–1.030)
Urobilinogen, Ur: 0.2 mg/dL (ref 0.2–1.0)
pH, UA: 6 (ref 5.0–7.5)

## 2023-02-07 LAB — MICROSCOPIC EXAMINATION

## 2023-02-07 LAB — BLADDER SCAN AMB NON-IMAGING: Scan Result: 0

## 2023-02-07 NOTE — Patient Instructions (Addendum)
Reminder:  When you see GYN provider in August for pessary check, ask about switching from "incontinence ring with support pessary" to "incontinence ring with support WITH KNOB pessary". The "knob" would be helpful to minimize your stress urinary incontinence.      Recommendations regarding UTI prevention / management:  When UTI symptoms occur: Call urology office to request order for urine culture. We recommend waiting for urine culture result prior to use of any antibiotics.  For bladder pain/ burning with urination: Over the counter Pyridium (phenazopyridine) as needed (commonly known under the "AZO" brand). No more than 3 days consecutively at a time due to risk for methemoglobinemia, liver function issues, and bone health damage with long term use of Pyridium.  Routine use for UTI prevention: - Topical vaginal estrogen for vaginal atrophy. Adequate fluid intake (>1.5 liters/day) to flush out the urinary tract. - Go to the bathroom to urinate every 4-6 hours while awake to minimize urinary stasis / bacterial overgrowth in the bladder. - Proanthocyanidin (PAC) supplement 36 mg daily; must be soluble (insoluble form of PAC will be ineffective). Recommended brand: Ellura. This is an over-the-counter supplement (often must be found/ purchased online) supplement derived from cranberries with concentrated active component: Proanthocyanidin (PAC) 36 mg daily. Decreases bacterial adherence to bladder lining. Not recommended for patients with interstitial cystitis due to acidity. - D-mannose powder (2 grams daily). This is an over-the-counter supplement which decreases bacterial adherence to bladder lining (it is a sugar that inhibits bacterial adherence to urothelial cells by binding to the pili of enteric bacteria). Take as per manufacturer recommendation. Can be used as an alternative or in addition to the concentrated cranberry supplement. Not recommended for diabetic patients due to sugar  content. - Vitamin C supplement to acidify urine to minimize bacterial growth. Not recommended for patients with interstitial cystitis due to acidity. - Probiotic to maintain healthy vaginal microbiome to suppress bacteria at urethral opening. Brand recommendations: Darrold Junker (includes probiotic & D-mannose ), Feminine Balance (highest concentration of lactobacillus) or Hyperbiotic Pro 15.

## 2023-02-07 NOTE — Progress Notes (Signed)
Name: Becky Baird DOB: 11-Jan-1934 MRN: [id number]  History of Present Illness: Ms. Midkiff is a 89 y.o. female who presents today for follow up visit at [facility] [place]. She is accompanied by her daughters Aggie Cosier and [place]. - GU history: 1. Recurrent UTI. - Prior episode of sepsis in September 2019. 2. Mixed urinary incontinence. - Urge incontinence: Does double voiding and positional voiding.  - Stress incontinence: Improved with pessary. 3. Pelvic organ prolapse. - Managed with a pessary; last pessary check was 12/14/2022 by her GYN provider Lauro Franklin, NP at [facility] [place]). 4. Vaginal atrophy. Uses topical vaginal estrogen cream as prescribed by her gynecologist. 5. Lichen sclerosus. Uses Clobetasol as prescribed by her gynecologist.  Urine culture results in past 12 months: - 04/14/2022: Positive for E. Coli and Pseudomonas aeruginosa - 05/08/2022: Positive for Candida albicans - 11/11/2022: Negative - 11/21/2022: Positive for Pseudomonas aeruginosa - 01/05/2023: Positive for Enterobacter cloacae complex  At last visit with Dr. Annabell Howells on 01/05/2023: - PVR was elevated (181 ml).  - Discussed Gemtesa for urge incontinence but elected to hold off on that. - Treated empirically with Fosfomycin 3 grams x3 doses 3 days apart for suspected UTI per catheterized urine specimen. - Urine culture came back positive for Enterobacter cloacae complex. - The plan was to consider daily low dose antibiotic in the future for UTI prophylaxis.   Today: She denies increased urinary urgency, frequency, dysuria, gross hematuria, straining to void, or sensations of incomplete emptying. She wears pads at times; usually uses a thin liner. States that her urinary incontinence is manageable at this time.   She denies flank or abdominal pain. She has some left hip / low back pain. Denies history of kidney stones. She denies fevers.   She has been using vaginal estrogen  cream at a frequency of 2 time(s) per week. Denies vaginal pain or bleeding. Reports some vaginal discharge, which she states her GYN provider said is physiologic related to pessary use.   Fall Screening: Do you usually have a device to assist in your mobility? Yes - cane   Medications: Current Outpatient Medications  Medication Sig Dispense Refill   ALPRAZolam (XANAX) 0.25 MG tablet 1 tid prn 90 tablet 2   clobetasol ointment (TEMOVATE) 0.05 % Apply 1 application topically 2 (two) times daily as needed.     cyanocobalamin (VITAMIN B12) 1000 MCG tablet Take 1 tablet (1,000 mcg total) by mouth daily. 30 tablet 0   estradiol (ESTRACE) 0.1 MG/GM vaginal cream Place 1 Applicatorful vaginally once a week.     hydrochlorothiazide (HYDRODIURIL) 25 MG tablet TAKE ONE TABLET BY MOUTH ONCE DAILY **STOP AMLODIPINE** 90 tablet 1   Ketotifen Fumarate (ALAWAY OP) Apply 1 drop to eye daily as needed.     loratadine (CLARITIN) 10 MG tablet Take 1 tablet (10 mg total) by mouth daily. (Patient taking differently: Take 10 mg by mouth at bedtime.) 30 tablet 5   OVER THE COUNTER MEDICATION Take 1 capsule by mouth every morning. SUPREMA DOPHILUS     OVER THE COUNTER MEDICATION Take by mouth daily. Vitamin D3 2,000 units daily     pantoprazole (PROTONIX) 40 MG tablet take 1 tablet by mouth daily 30 tablet 0   potassium chloride (KLOR-CON M) 10 MEQ tablet TAKE ONE TABLET BY MOUTH TWICE DAILY 60 tablet 2   potassium chloride (KLOR-CON) 10 MEQ tablet take 1 tablet by mouth twice daily 60 tablet 0   Rivaroxaban (XARELTO) 15 MG TABS  tablet TAKE ONE TABLET BY MOUTH DAILY WITH SUPPER 90 tablet 1   sodium chloride (OCEAN) 0.65 % nasal spray Place 1 spray into the nose as needed for congestion.      fosfomycin (MONUROL) 3 g PACK Mix one packet in 8oz of fluid and take po q3days. (Patient not taking: Reported on 02/07/2023) 9 g 1   No current facility-administered medications for this visit.    Allergies: Allergies   Allergen Reactions   Amlodipine     Severe pedal edema with 2.5 mg use   Statins Other (See Comments)    Myalgias with rosuvastatin   Neurontin [Gabapentin]     Cognitive issues, creates fogginess   Niaspan [Niacin Er] Rash   Oxybutynin     Nausea sweats    Past Medical History:  Diagnosis Date   Anxiety    Chronic back pain    Hyperlipidemia    Hypertension    Memory loss    Mild cognitive impairment, so stated    Occlusion and stenosis of carotid artery with cerebral infarction    Bilateral less than 50% carotid stenosis.   Persistent atrial fibrillation (HCC) 06/23/2016   Renal cyst    Sciatica    Stroke (HCC)    2010, RMCA with stent   Toe fracture 11/2015   R toe   Ulcer 1970s   Past Surgical History:  Procedure Laterality Date   BIOPSY  04/25/2018   Procedure: BIOPSY;  Surgeon: Malissa Hippo, MD;  [facility];  Service: Endoscopy;;  gastric polyps   COLONOSCOPY  03/10/2011   Procedure: COLONOSCOPY;  Surgeon: Malissa Hippo, MD;  [facility];  Service: Endoscopy;  Laterality: N/A;   COLONOSCOPY N/A 08/24/2016   Procedure: COLONOSCOPY;  Surgeon: Malissa Hippo, MD;  [facility]: [facility];  Service: Endoscopy;  Laterality: N/A;  930   DILATION AND CURETTAGE OF UTERUS     years ago   ESOPHAGOGASTRODUODENOSCOPY N/A 04/25/2018   Procedure: ESOPHAGOGASTRODUODENOSCOPY (EGD);  Surgeon: Malissa Hippo, MD;  [facility]: [facility];  Service: Endoscopy;  Laterality: N/A;  1:15   OVARIAN CYST REMOVAL     at least 10 years ago   POLYPECTOMY  08/24/2016   Procedure: POLYPECTOMY;  Surgeon: Malissa Hippo, MD;  [facility]: [facility];  Service: Endoscopy;;  ascending colon; hepatic flexure   TONSILLECTOMY     childhood   Family History  Problem Relation Age of Onset   Stroke Mother    Heart failure Father    Diabetes Brother    Social History   Socioeconomic History   Marital status: Widowed    Spouse name: Not on file   Number of  children: 2   Years of education: College   Highest education level: 12th grade  Occupational History    Employer: RETIRED  Tobacco Use   Smoking status: Never   Smokeless tobacco: Never  Vaping Use   Vaping status: Never Used  Substance and Sexual Activity   Alcohol use: No    Alcohol/week: 0.0 standard drinks of alcohol   Drug use: No   Sexual activity: Not Currently  Other Topics Concern   Not on file  Social History Narrative   Patient lives at home alone.    2 "wonderful" daughters per pt.    3 grandchildren.   Caffeine Use: very little, rarely   Social Determinants of Health   Financial Resource Strain: Low Risk  (06/08/2021)   Overall Financial Resource  Strain (CARDIA)    Difficulty of Paying Living Expenses: Not hard at all  Food Insecurity: No Food Insecurity (06/08/2021)   Hunger Vital Sign    Worried About Running Out of Food in the Last Year: Never true    Ran Out of Food in the Last Year: Never true  Transportation Needs: No Transportation Needs (06/08/2021)   PRAPARE - Administrator, Civil Service (Medical): No    Lack of Transportation (Non-Medical): No  Physical Activity: Insufficiently Active (06/08/2021)   Exercise Vital Sign    Days of Exercise per Week: 5 days    Minutes of Exercise per Session: 20 min  Stress: No Stress Concern Present (06/08/2021)   [employer] of Occupational Health - Occupational Stress Questionnaire    Feeling of Stress : Not at all  Social Connections: Socially Isolated (06/08/2021)   Social Connection and Isolation Panel [NHANES]    Frequency of Communication with Friends and Family: More than three times a week    Frequency of Social Gatherings with Friends and Family: More than three times a week    Attends Religious Services: Never    Database administrator or Organizations: No    Attends Banker Meetings: Never    Marital Status: Widowed  Intimate Partner Violence: Not At Risk  (06/08/2021)   Humiliation, Afraid, Rape, and Kick questionnaire    Fear of Current or Ex-Partner: No    Emotionally Abused: No    Physically Abused: No    Sexually Abused: No    Review of Systems Constitutional: Patient denies any unintentional weight loss or change in strength lntegumentary: Patient denies any rashes or pruritus Cardiovascular: Patient denies chest pain or syncope Respiratory: Patient denies shortness of breath Gastrointestinal: Patient denies nausea, vomiting, constipation, or diarrhea Musculoskeletal: Patient denies muscle cramps or weakness Neurologic: Patient denies convulsions or seizures Psychiatric: Patient denies memory problems Allergic/Immunologic: Patient denies recent allergic reaction(s) Hematologic/Lymphatic: Patient denies bleeding tendencies Endocrine: Patient denies heat/cold intolerance  GU: As per HPI.  OBJECTIVE Vitals:   02/07/23 1402  BP: (!) 148/69  Pulse: 65  Temp: 98.6 F (37 C)   There is no height or weight on file to calculate BMI.  Physical Examination Constitutional: No obvious distress; patient is non-toxic appearing  Cardiovascular: No visible lower extremity edema.  Respiratory: The patient does not have audible wheezing/stridor; respirations do not appear labored  Gastrointestinal: Abdomen non-distended Musculoskeletal: Normal ROM of UEs  Skin: No obvious rashes/open sores  Neurologic: CN 2-12 grossly intact Psychiatric: Answered questions appropriately with normal affect  Hematologic/Lymphatic/Immunologic: No obvious bruises or sites of spontaneous bleeding  UA (cath): positive for 11-30 WBC/hpf, 3-10 RBC/hpf, bacteria (many) PVR: 0 ml  ASSESSMENT Asymptomatic bacteriuria  Recurrent UTI - Plan: In and Out Cath, Urinalysis, Routine w reflex microscopic, Urine Culture, BLADDER SCAN AMB NON-IMAGING  Urge incontinence - Plan: In and Out Cath, Urinalysis, Routine w reflex microscopic, Urine Culture, BLADDER SCAN AMB  NON-IMAGING  Vaginal atrophy - Plan: In and Out Cath, Urinalysis, Routine w reflex microscopic, Urine Culture, BLADDER SCAN AMB NON-IMAGING  Mixed stress and urge incontinence - Plan: In and Out Cath, Urinalysis, Routine w reflex microscopic, Urine Culture, BLADDER SCAN AMB NON-IMAGING  Female genital prolapse, unspecified type - Plan: In and Out Cath, Urinalysis, Routine w reflex microscopic, Urine Culture, BLADDER SCAN AMB NON-IMAGING  Vaginal pessary present - Plan: In and Out Cath, Urinalysis, Routine w reflex microscopic, Urine Culture, BLADDER SCAN AMB NON-IMAGING  Lichen sclerosus  of female genitalia   Asymptomatic bacteriuria today; history of Recurrent UTIs. UA today appears abnormal however patient is asymptomatic for UTI, therefore acute antibiotic treatment is not advised at this time. The rationale for this was discussed with the patient.  - We discussed the difference between asymptomatic bacteriuria versus symptoms suggestive of a possible UTI which may include: fever, confusion, malaise, increased fatigue, dysuria, acute increase in urinary frequency / urgency / urge incontinence. - Patient was advised that positive urine cultures only warrant acute antibiotic treatment if UTI symptoms are present. - In the absence of active UTI symptoms, a positive urine culture is considered to represent colonization of the urinary tract which does not warrant acute antibiotic treatment. - We discussed antibiotic stewardship and goal to minimize risk for developing antibiotic resistance.  Patient was advised that if/when UTI-like symptoms occur they can call our office to speak with a nurse, who can place an order for urinalysis (with reflex to urine culture). They may then proceed to a [facility] to provide their urine sample or can drop off urine specimen collected at home; she was given a hat and specimen containers today.  We discussed the possible etiologies of recurrent UTls  including ascending infection related to intercourse; vaginal atrophy; transmural infection that has been treated incompletely; urinary tract stones; incomplete bladder emptying with urinary stasis; kidney or bladder tumor; urethral diverticulum; and colonization of  vagina and urinary tract with pathologic, adherent organisms.   For UTI prevention we discussed options including: Adequate fluid intake (>1.5 liters/day) to flush out the urinary tract. - Go to the bathroom to urinate every 4-6 hours while awake to minimize urinary stasis / bacterial overgrowth in the bladder. - Proanthocyanidin (PAC) supplement 36 mg daily; must be soluble (insoluble form of PAC will be ineffective). Recommend Ellura. D-mannose 2 g daily Vitamin C supplement Probiotic to maintain healthy vaginal microbiome - Topical vaginal estrogen for vaginal atrophy.   If recurrent UTls persist, may consider UTI prophylaxis with a daily low dose antibiotic in the future.  Urge incontinence:  Advised to continue double voiding and positional voiding.  Discussed importance of changing pads when damp to reduce risk for UTIs and skin breakdown / irritation, especially in the setting of lichen sclerosus.  Pelvic organ prolapse / Stress incontinence:  At next pessary check with GYN provider in August: We discussed the option of asking to switch from an incontinence ring with support pessary to incontinence ring with support with knob pessary. Patient / daughters were show visual depiction of how the "knob" would be helpful to address stress urinary incontinence.  4. Vaginal atrophy.  Advised to continue routine use of topical vaginal estrogen cream at least 2x/week as prescribed by her gynecologist. We discussed how topical vaginal estrogen cream use minimizes UTI risk by optimizing vaginal / urethral microbiome and mucosal integrity.  5. Lichen sclerosus. We reviewed indication for Clobetasol use and discussed recommendation to  use sparingly as needed to minimize tissue thinning.   Will plan for follow up in 3 months or sooner if needed. Pt verbalized understanding and agreement. All questions were answered.  PLAN Advised the following: 1. Continue routine use of topical vaginal estrogen cream at least 2x/week. 2. Adequate fluid intake (>1.5 liters/day) to flush out the urinary tract. 3. Urinate every 4-6 hours while awake to minimize urinary stasis/ bacterial overgrowth in the bladder. 4. Consider OTC supplements for UTI prevention. 5. Discuss pessary type at next GYN visit. 6. Continue double voiding  and positional voiding as needed. 7. Return in about 3 months (around 05/10/2023) for UA, PVR, & f/u with Evette Georges NP.  Orders Placed This Encounter  Procedures   Urine Culture   Urinalysis, Routine w reflex microscopic   In and Out Cath   BLADDER SCAN AMB NON-IMAGING   It has been explained that the patient is to follow regularly with their PCP in addition to all other providers involved in their care and to follow instructions provided by these respective offices. Patient advised to contact urology clinic if any urologic-pertaining questions, concerns, new symptoms or problems arise in the interim period.  Total time spent caring for the patient today was over 45 minutes. This includes time spent on the date of the visit reviewing the patient's chart before the visit, time spent during the visit, and time spent after the visit on documentation. Over 50% of that time was spent in face-to-face time with this patient for direct counseling. E&M based on time and complexity of medical decision making.  Patient Instructions  Reminder:  When you see GYN provider in August for pessary check, ask about switching from "incontinence ring with support pessary" to "incontinence ring with support WITH KNOB pessary". The "knob" would be helpful to minimize your stress urinary incontinence.      Recommendations  regarding UTI prevention / management:  When UTI symptoms occur: Call urology office to request order for urine culture. We recommend waiting for urine culture result prior to use of any antibiotics.  For bladder pain/ burning with urination: Over the counter Pyridium (phenazopyridine) as needed (commonly known under the "AZO" brand). No more than 3 days consecutively at a time due to risk for methemoglobinemia, liver function issues, and bone health damage with long term use of Pyridium.  Routine use for UTI prevention: - Topical vaginal estrogen for vaginal atrophy. Adequate fluid intake (>1.5 liters/day) to flush out the urinary tract. - Go to the bathroom to urinate every 4-6 hours while awake to minimize urinary stasis / bacterial overgrowth in the bladder. - Proanthocyanidin (PAC) supplement 36 mg daily; must be soluble (insoluble form of PAC will be ineffective). Recommended brand: Ellura. This is an over-the-counter supplement (often must be found/ purchased online) supplement derived from cranberries with concentrated active component: Proanthocyanidin (PAC) 36 mg daily. Decreases bacterial adherence to bladder lining. Not recommended for patients with interstitial cystitis due to acidity. - D-mannose powder (2 grams daily). This is an over-the-counter supplement which decreases bacterial adherence to bladder lining (it is a sugar that inhibits bacterial adherence to urothelial cells by binding to the pili of enteric bacteria). Take as per manufacturer recommendation. Can be used as an alternative or in addition to the concentrated cranberry supplement. Not recommended for diabetic patients due to sugar content. - Vitamin C supplement to acidify urine to minimize bacterial growth. Not recommended for patients with interstitial cystitis due to acidity. - Probiotic to maintain healthy vaginal microbiome to suppress bacteria at urethral opening. Brand recommendations: Darrold Junker (includes probiotic &  D-mannose ), Feminine Balance (highest concentration of lactobacillus) or Hyperbiotic Pro 15.  Electronically signed by:  Donnita Falls, FNP   02/07/23    3:26 PM

## 2023-02-07 NOTE — Progress Notes (Signed)
Pt is prepped for and in and out catherization. Patient was cleaned and prepped in a sterle fashion with betadine. A 16 fr catheter foley was inserted. Urine return was note 150 ml.  Performed by Kennyth Lose, CMA  Urine sent LAB

## 2023-02-08 ENCOUNTER — Other Ambulatory Visit: Payer: Self-pay | Admitting: Family Medicine

## 2023-02-09 ENCOUNTER — Ambulatory Visit: Payer: Medicare Other | Admitting: Urology

## 2023-02-10 LAB — URINE CULTURE

## 2023-02-10 NOTE — Progress Notes (Signed)
02/07/23: Asymptomatic bacteriuria; no antibiotic treatment advised. Discussed at visit.

## 2023-02-24 ENCOUNTER — Telehealth: Payer: Self-pay | Admitting: Cardiology

## 2023-02-24 NOTE — Telephone Encounter (Signed)
Call to patient. Her daughter answered.  Advised Her PCP prescribes this medication. She will reach out to him for refills or samples

## 2023-02-24 NOTE — Telephone Encounter (Signed)
Patient calling the office for samples of medication:   1.  What medication and dosage are you requesting samples for? Rivaroxaban (XARELTO) 15 MG TABS tablet  2.  Are you currently out of this medication? No   

## 2023-03-01 ENCOUNTER — Telehealth: Payer: Self-pay

## 2023-03-01 ENCOUNTER — Encounter: Payer: Self-pay | Admitting: *Deleted

## 2023-03-01 NOTE — Telephone Encounter (Signed)
Patient notified of provider recommendations via my chart

## 2023-03-01 NOTE — Telephone Encounter (Signed)
I am sympathetic to the high cost of medicine but unfortunately we do not get samples of these medicines As for seeing cardiologist we can discuss this when they come in for their follow-up later this month I do not necessarily see anything that they have to do regular follow-ups with cardiology Thanks-Dr. Lorin Picket

## 2023-03-01 NOTE — Telephone Encounter (Signed)
Patient's daughter Rosey Bath) called and stated that the patient is about to hit the medicare gap for Xarelto and the cost of this medication is to much.  Daughter stated that she had called the cardiologist to get samples. Daughter was informed that Dr Lorin Picket prescribed this medication and she wanted to know was there any reason that the patient Mccone) should continue to see the cardiologist. Daughter was informed that Dr Lorin Picket consults with specialist that work with his patients and that Kataya should continue to see her cardiologist. Daughter verbalized understanding.

## 2023-03-08 ENCOUNTER — Other Ambulatory Visit: Payer: Self-pay | Admitting: Family Medicine

## 2023-03-14 ENCOUNTER — Ambulatory Visit: Payer: Medicare Other | Admitting: Family Medicine

## 2023-03-20 DIAGNOSIS — S92514A Nondisplaced fracture of proximal phalanx of right lesser toe(s), initial encounter for closed fracture: Secondary | ICD-10-CM | POA: Diagnosis not present

## 2023-03-20 DIAGNOSIS — L03115 Cellulitis of right lower limb: Secondary | ICD-10-CM | POA: Diagnosis not present

## 2023-03-20 DIAGNOSIS — M79671 Pain in right foot: Secondary | ICD-10-CM | POA: Diagnosis not present

## 2023-03-22 DIAGNOSIS — L110 Acquired keratosis follicularis: Secondary | ICD-10-CM | POA: Diagnosis not present

## 2023-03-22 DIAGNOSIS — M79671 Pain in right foot: Secondary | ICD-10-CM | POA: Diagnosis not present

## 2023-03-22 DIAGNOSIS — M79672 Pain in left foot: Secondary | ICD-10-CM | POA: Diagnosis not present

## 2023-03-22 DIAGNOSIS — M79675 Pain in left toe(s): Secondary | ICD-10-CM | POA: Diagnosis not present

## 2023-03-22 DIAGNOSIS — I739 Peripheral vascular disease, unspecified: Secondary | ICD-10-CM | POA: Diagnosis not present

## 2023-03-22 DIAGNOSIS — M79674 Pain in right toe(s): Secondary | ICD-10-CM | POA: Diagnosis not present

## 2023-04-03 DIAGNOSIS — S93601D Unspecified sprain of right foot, subsequent encounter: Secondary | ICD-10-CM | POA: Diagnosis not present

## 2023-04-03 DIAGNOSIS — S92514D Nondisplaced fracture of proximal phalanx of right lesser toe(s), subsequent encounter for fracture with routine healing: Secondary | ICD-10-CM | POA: Diagnosis not present

## 2023-04-12 ENCOUNTER — Ambulatory Visit: Payer: Medicare Other | Admitting: Family Medicine

## 2023-04-12 VITALS — BP 149/75 | HR 74 | Temp 97.7°F | Wt 136.4 lb

## 2023-04-12 DIAGNOSIS — Z4689 Encounter for fitting and adjustment of other specified devices: Secondary | ICD-10-CM | POA: Diagnosis not present

## 2023-04-12 DIAGNOSIS — N390 Urinary tract infection, site not specified: Secondary | ICD-10-CM | POA: Diagnosis not present

## 2023-04-12 DIAGNOSIS — Z23 Encounter for immunization: Secondary | ICD-10-CM | POA: Diagnosis not present

## 2023-04-12 DIAGNOSIS — Z780 Asymptomatic menopausal state: Secondary | ICD-10-CM | POA: Diagnosis not present

## 2023-04-12 DIAGNOSIS — L900 Lichen sclerosus et atrophicus: Secondary | ICD-10-CM | POA: Diagnosis not present

## 2023-04-12 DIAGNOSIS — Z13820 Encounter for screening for osteoporosis: Secondary | ICD-10-CM

## 2023-04-12 DIAGNOSIS — N3941 Urge incontinence: Secondary | ICD-10-CM | POA: Diagnosis not present

## 2023-04-12 DIAGNOSIS — N8110 Cystocele, unspecified: Secondary | ICD-10-CM | POA: Diagnosis not present

## 2023-04-12 LAB — POCT URINALYSIS DIP (CLINITEK)
Bilirubin, UA: NEGATIVE
Glucose, UA: NEGATIVE mg/dL
Ketones, POC UA: NEGATIVE mg/dL
Nitrite, UA: NEGATIVE
Spec Grav, UA: <=1.005 — AB (ref 1.010–1.025)
Urobilinogen, UA: 0.2 U/dL
pH, UA: 6.5 (ref 5.0–8.0)

## 2023-04-12 NOTE — Progress Notes (Signed)
Subjective:    Patient ID: Becky Baird, female    DOB: [birth year], 89 y.o.   MRN: [id number]  HPI Patient arrives for 3 month follow up. Patient would like to know if she needs to continue to see her cardiologist. Patient brought in urine sample, she states she is having urgency.  Urgency incontinence - Plan: POCT URINALYSIS DIP (CLINITEK), Urine Culture  Screening for osteoporosis  Needs flu shot - Plan: Flu Vaccine Trivalent High Dose (Fluad)  Encounter for osteoporosis screening in asymptomatic postmenopausal patient - Plan: DG Bone Density   Review of Systems     Objective:   Physical Exam  Murmur noted heart regular pulse normal extremities no edema skin warm dry lungs clear      Assessment & Plan:   1. Urgency incontinence I doubt that this urinary tract infection or think this is more standard urinary retention related to her underlying problems but we will do a urine culture.  May or may not treat with antibiotics depending on findings - POCT URINALYSIS DIP (CLINITEK) - Urine Culture  2. Screening for osteoporosis Patient does need bone density update She has osteopenia in her feet and have fracture  3. Needs flu shot Flu shot today - Flu Vaccine Trivalent High Dose (Fluad)  4. Encounter for osteoporosis screening in asymptomatic postmenopausal patient Bone density recommended - DG Bone Density Moods are stable  Patient has underlying cardiac condition but tolerating Xarelto well no bleeding issues no need for any changes currently family would like to follow through with [place] on a regular basis we will try to obviously keep up with her Xarelto make sure that she is not have any bleeding problems plus also she would need to do an echo on a yearly basis

## 2023-04-13 ENCOUNTER — Encounter: Payer: Self-pay | Admitting: Family Medicine

## 2023-04-13 ENCOUNTER — Other Ambulatory Visit: Payer: Self-pay | Admitting: Family Medicine

## 2023-04-17 LAB — URINE CULTURE

## 2023-04-24 ENCOUNTER — Encounter: Payer: Self-pay | Admitting: Family Medicine

## 2023-05-01 DIAGNOSIS — S92514D Nondisplaced fracture of proximal phalanx of right lesser toe(s), subsequent encounter for fracture with routine healing: Secondary | ICD-10-CM | POA: Diagnosis not present

## 2023-05-03 ENCOUNTER — Ambulatory Visit: Payer: Medicare Other | Admitting: Cardiology

## 2023-05-03 DIAGNOSIS — I739 Peripheral vascular disease, unspecified: Secondary | ICD-10-CM | POA: Diagnosis not present

## 2023-05-03 DIAGNOSIS — N8110 Cystocele, unspecified: Secondary | ICD-10-CM | POA: Diagnosis not present

## 2023-05-03 DIAGNOSIS — Z4689 Encounter for fitting and adjustment of other specified devices: Secondary | ICD-10-CM | POA: Diagnosis not present

## 2023-05-03 DIAGNOSIS — N390 Urinary tract infection, site not specified: Secondary | ICD-10-CM | POA: Diagnosis not present

## 2023-05-03 DIAGNOSIS — M79675 Pain in left toe(s): Secondary | ICD-10-CM | POA: Diagnosis not present

## 2023-05-03 DIAGNOSIS — M79672 Pain in left foot: Secondary | ICD-10-CM | POA: Diagnosis not present

## 2023-05-03 DIAGNOSIS — N952 Postmenopausal atrophic vaginitis: Secondary | ICD-10-CM | POA: Diagnosis not present

## 2023-05-03 DIAGNOSIS — L110 Acquired keratosis follicularis: Secondary | ICD-10-CM | POA: Diagnosis not present

## 2023-05-03 DIAGNOSIS — R319 Hematuria, unspecified: Secondary | ICD-10-CM | POA: Diagnosis not present

## 2023-05-03 DIAGNOSIS — M79674 Pain in right toe(s): Secondary | ICD-10-CM | POA: Diagnosis not present

## 2023-05-03 DIAGNOSIS — M79671 Pain in right foot: Secondary | ICD-10-CM | POA: Diagnosis not present

## 2023-05-03 DIAGNOSIS — L900 Lichen sclerosus et atrophicus: Secondary | ICD-10-CM | POA: Diagnosis not present

## 2023-05-08 NOTE — Progress Notes (Signed)
Name: Becky Baird DOB: [birth year] MRN: [id number]  History of Present Illness: Becky Baird is a 89 y.o. female who presents today for follow up visit at [facility] [place]. She is accompanied by her daughter. - GU history: 1. Recurrent UTI. - Prior episode of sepsis in September 2019. 2. OAB with urinary frequency, nocturia, urgency, and urge incontinence. 3. Stress urinary incontinence. Improved with pessary. 4. Pelvic organ prolapse (cystocele). - Has a #5 incontinence ring pessary which is managed by her GYN provider. 5. Vaginal atrophy. Uses topical vaginal estrogen cream as prescribed by her gynecologist. 6. Lichen sclerosus. Uses Clobetasol as prescribed by her gynecologist.   Urine culture results in past 12 months: - 04/14/2022: Positive for E. Coli and Pseudomonas aeruginosa - 05/08/2022: Positive for Candida albicans - 11/11/2022: Negative - 11/21/2022: Positive for Pseudomonas aeruginosa - 01/05/2023: Positive for Enterobacter cloacae complex - 02/07/2023: Positive for E. Coli; asymptomatic (no antibiotic prescribed) - 04/12/2023: Positive for Proteus mirabilis; asymptomatic (no antibiotic prescribed) - 05/03/2023: Positive for Proteus mirabilis; asymptomatic (treated with Cipro by GYN provider)  At last visit on 02/07/2023: - Doing well; wearing thin pads and stated that her urinary incontinence is manageable. - Discussed finding of asymptomatic bacteriuria per I&O urinalysis. Patient asymptomatic for UTI, therefore antibiotic treatment not advised. - The plan was:  1. For UTI prevention: - Adequate fluid intake (>1.5 liters/day) to flush out the urinary tract. - Urinate every 4-6 hours while awake to minimize urinary stasis/ bacterial overgrowth in the bladder. - Consider OTC supplements for UTI prevention. 2. For urge incontinence:  - Continue double voiding and positional voiding as needed. 3. For prolapse & stress incontinence:  - Advised to  discuss possible switch from an incontinence ring with support pessary to incontinence ring with support with knob pessary at her next pessary check with GYN provider in August 2024 for  improved management of her stress urinary incontinence.  4. For vaginal atrophy: - Continue routine use of topical vaginal estrogen cream at least 2x/week. 5. For lichen sclerosus. We reviewed indication for Clobetasol use and discussed recommendation to use sparingly as needed to minimize tissue thinning.   Since last visit: > 04/12/2023: Seen by GYN provider. - Evidence of excoriated vaginal tissue with bleeding likely from atrophic tissues and irritation with pessary.  - Patient and family declined pessary holiday since her UTI recurrence has decreased with pessary in place and due to history of urosepsis. - The plan was daily use of topical vaginal estrogen cream and follow up in 3 weeks.  > 05/03/2023: Seen by GYN provider. - Vaginal excoriation was improved compared to prior.  - Asymptomatic for UTI, however due to abnormal UA a urine culture was sent which grew Proteus mirabilis and she was treated with Cipro 250 mg 2x/day x7 days. - Pessary left in place (no change in pessary type). - Advised to continue vaginal estrogen cream nightly.   Today: She reports chief complaint of urinary frequency, nocturia, urgency, and urge incontinence today. She reports voiding "every 30 minutes" during the day and 5-8 times per night. She leaks multiple times per day. Wears at least 5 diapers per day, which are heavily saturated.  She reports stress incontinence but that is not significantly bothersome.   She denies acute UTI symptoms today. Past UTI symptoms have included increased confusion, fatigue, general malaise.   She denies dysuria, gross hematuria, hesitancy, straining to void, sensations of incomplete emptying, flank pain, abdominal pain, fevers, nausea, or vomiting. Her  daughter confirms today that Mrs.  Moskal does not seem confused.    Fall Screening: Do you usually have a device to assist in your mobility? Yes - cane   Medications: Current Outpatient Medications  Medication Sig Dispense Refill   clobetasol ointment (TEMOVATE) 0.05 % Apply 1 application topically 2 (two) times daily as needed.     estradiol (ESTRACE) 0.1 MG/GM vaginal cream Place 1 Applicatorful vaginally once a week.     Vibegron (GEMTESA) 75 MG TABS Take 1 tablet (75 mg total) by mouth daily. 30 tablet 5   ALPRAZolam (XANAX) 0.25 MG tablet take 1 tablet by mouth 3 times daily as needed 90 tablet 0   cyanocobalamin (VITAMIN B12) 1000 MCG tablet Take 1 tablet (1,000 mcg total) by mouth daily. 30 tablet 0   hydrochlorothiazide (HYDRODIURIL) 25 MG tablet Take 1 tablet by mouth once daily  stop amlodipine 90 tablet 0   Ketotifen Fumarate (ALAWAY OP) Apply 1 drop to eye daily as needed.     loratadine (CLARITIN) 10 MG tablet Take 1 tablet (10 mg total) by mouth daily. (Patient taking differently: Take 10 mg by mouth at bedtime.) 30 tablet 5   OVER THE COUNTER MEDICATION Take 1 capsule by mouth every morning. SUPREMA DOPHILUS     OVER THE COUNTER MEDICATION Take by mouth daily. Vitamin D3 2,000 units daily     pantoprazole (PROTONIX) 40 MG tablet take 1 tablet by mouth daily 30 tablet 0   potassium chloride (KLOR-CON M) 10 MEQ tablet TAKE ONE TABLET BY MOUTH TWICE DAILY 60 tablet 2   potassium chloride (KLOR-CON) 10 MEQ tablet take 1 tablet by mouth twice daily 60 tablet 0   sodium chloride (OCEAN) 0.65 % nasal spray Place 1 spray into the nose as needed for congestion.      XARELTO 15 MG TABS tablet take 1 tablet by mouth daily with supper 90 tablet 0   No current facility-administered medications for this visit.    Allergies: Allergies  Allergen Reactions   Amlodipine     Severe pedal edema with 2.5 mg use   Statins Other (See Comments)    Myalgias with rosuvastatin   Neurontin [Gabapentin]     Cognitive issues,  creates fogginess   Niaspan [Niacin Er (Antihyperlipidemic)] Rash   Oxybutynin     Nausea sweats    Past Medical History:  Diagnosis Date   Anxiety    Chronic back pain    Hyperlipidemia    Hypertension    Memory loss    Mild cognitive impairment, so stated    Occlusion and stenosis of carotid artery with cerebral infarction    Bilateral less than 50% carotid stenosis.   Persistent atrial fibrillation (HCC) 06/23/2016   Renal cyst    Sciatica    Stroke (HCC)    2010, RMCA with stent   Toe fracture 11/2015   R toe   Ulcer 1970s   Past Surgical History:  Procedure Laterality Date   BIOPSY  04/25/2018   Procedure: BIOPSY;  Surgeon: Malissa Hippo, MD;  [facility]: [facility];  Service: Endoscopy;;  gastric polyps   COLONOSCOPY  03/10/2011   Procedure: COLONOSCOPY;  Surgeon: Malissa Hippo, MD;  [facility]: [facility];  Service: Endoscopy;  Laterality: N/A;   COLONOSCOPY N/A 08/24/2016   Procedure: COLONOSCOPY;  Surgeon: Malissa Hippo, MD;  Location: [facility];  Service: Endoscopy;  Laterality: N/A;  930   DILATION AND CURETTAGE OF UTERUS  years ago   ESOPHAGOGASTRODUODENOSCOPY N/A 04/25/2018   Procedure: ESOPHAGOGASTRODUODENOSCOPY (EGD);  Surgeon: Malissa Hippo, MD;  Location: [facility];  Service: Endoscopy;  Laterality: N/A;  1:15   OVARIAN CYST REMOVAL     at least 10 years ago   POLYPECTOMY  08/24/2016   Procedure: POLYPECTOMY;  Surgeon: Malissa Hippo, MD;  Location: [facility];  Service: Endoscopy;;  ascending colon; hepatic flexure   TONSILLECTOMY     childhood   Family History  Problem Relation Age of Onset   Stroke Mother    Heart failure Father    Diabetes Brother    Social History   Socioeconomic History   Marital status: Widowed    Spouse name: Not on file   Number of children: 2   Years of education: College   Highest education level: 12th grade  Occupational History    Employer: RETIRED  Tobacco Use   Smoking status: Never    Smokeless tobacco: Never  Vaping Use   Vaping status: Never Used  Substance and Sexual Activity   Alcohol use: No    Alcohol/week: 0.0 standard drinks of alcohol   Drug use: No   Sexual activity: Not Currently  Other Topics Concern   Not on file  Social History Narrative   Patient lives at home alone.    2 "wonderful" daughters per pt.    3 grandchildren.   Caffeine Use: very little, rarely   Social Determinants of Health   Financial Resource Strain: Low Risk  (06/08/2021)   Overall Financial Resource Strain (CARDIA)    Difficulty of Paying Living Expenses: Not hard at all  Food Insecurity: No Food Insecurity (06/08/2021)   Hunger Vital Sign    Worried About Running Out of Food in the Last Year: Never true    Ran Out of Food in the Last Year: Never true  Transportation Needs: No Transportation Needs (06/08/2021)   PRAPARE - Administrator, Civil Service (Medical): No    Lack of Transportation (Non-Medical): No  Physical Activity: Insufficiently Active (06/08/2021)   Exercise Vital Sign    Days of Exercise per Week: 5 days    Minutes of Exercise per Session: 20 min  Stress: No Stress Concern Present (06/08/2021)   [employer] of Occupational Health - Occupational Stress Questionnaire    Feeling of Stress : Not at all  Social Connections: Socially Isolated (06/08/2021)   Social Connection and Isolation Panel [NHANES]    Frequency of Communication with Friends and Family: More than three times a week    Frequency of Social Gatherings with Friends and Family: More than three times a week    Attends Religious Services: Never    Database administrator or Organizations: No    Attends Banker Meetings: Never    Marital Status: Widowed  Intimate Partner Violence: Not At Risk (06/08/2021)   Humiliation, Afraid, Rape, and Kick questionnaire    Fear of Current or Ex-Partner: No    Emotionally Abused: No    Physically Abused: No    Sexually Abused: No    Review of Systems Constitutional: Patient denies any unintentional weight loss or change in strength lntegumentary: Patient denies any rashes or pruritus Cardiovascular: Patient denies chest pain or syncope Respiratory: Patient denies shortness of breath Gastrointestinal: Patient denies nausea, vomiting, constipation, or diarrhea Musculoskeletal: Patient denies muscle cramps or weakness Neurologic: Patient denies convulsions or seizures Allergic/Immunologic: Patient denies recent allergic reaction(s) Hematologic/Lymphatic: Patient denies bleeding tendencies  Endocrine: Patient denies heat/cold intolerance  GU: As per HPI.  OBJECTIVE Vitals:   05/16/23 1340  BP: (!) 145/71  Pulse: 73  Temp: 98.4 F (36.9 C)   There is no height or weight on file to calculate BMI.  Physical Examination Constitutional: No obvious distress; patient is non-toxic appearing  Cardiovascular: No visible lower extremity edema.  Respiratory: The patient does not have audible wheezing/stridor; respirations do not appear labored  Gastrointestinal: Abdomen non-distended Musculoskeletal: Normal ROM of UEs  Skin: No obvious rashes/open sores  Neurologic: CN 2-12 grossly intact Psychiatric: Answered questions appropriately with normal affect  Hematologic/Lymphatic/Immunologic: No obvious bruises or sites of spontaneous bleeding  Voided UA: >30 WBC/hpf, 3-10 RBC/hpf, bacteria (moderate), granular casts present  I&O cath UA: Negative  PVR: 40 ml  ASSESSMENT Asymptomatic bacteriuria - Plan: In and Out Cath, Urinalysis, Routine w reflex microscopic  Recurrent UTI  Vaginal atrophy  Mixed stress and urge incontinence  Female genital prolapse, unspecified type  Vaginal pessary present  Lichen sclerosus of female genitalia  OAB (overactive bladder) - Plan: Vibegron (GEMTESA) 75 MG TABS  Urinary urgency - Plan: Vibegron (GEMTESA) 75 MG TABS  Urinary frequency - Plan: Vibegron (GEMTESA) 75 MG  TABS  Urge incontinence - Plan: Vibegron (GEMTESA) 75 MG TABS  She is asymptomatic for UTI today. We discussed the difference between her voided urine sample (which was positive for nitrites, leukocytes, blood) compared to the clean I&O catheterized urine sample collected today (which was normal). Based on this finding it is highly likely that her voided urine specimens are unreliable for evaluation of UTI due to vaginal contamination. She was therefore advised to request I&O catheter for urine specimen collection any time UTI is suspected. Patient was advised that if/when UTI-like symptoms occur they can call our office to speak with a nurse, who can place an order for urinalysis (with reflex to urine culture).   We discussed the difference between asymptomatic bacteriuria versus symptoms suggestive of a possible UTI which may include: fever, confusion, malaise, increased fatigue, dysuria, acute increase in urinary frequency / urgency / urge incontinence. - We discussed that urine color, clarity, and odor are not clinical indicators of UTI. Urine testing / cultures are not advised in the absence of acute UTI symptoms given that acute antibiotic treatment would not be indicated regardless of the findings. - In the absence of active UTI symptoms, a positive urine culture is considered to represent colonization of the urinary tract which does not warrant acute antibiotic treatment. - We discussed antibiotic stewardship and goal to minimize risk for developing antibiotic resistance.  She was advised that the small amount of blood & leukocytes seen on her contaminated voided specimen are likely due to mild vaginal irritation related to her vaginal atrophy, lichen sclerosus, and pessary use.   OAB with urinary frequency, nocturia, urgency, and urge incontinence: We discussed the symptoms of overactive bladder (OAB), which include urinary urgency, frequency, nocturia, with or without urge incontinence. We  discussed this patient's neurogenic risk factors for OAB-type symptoms including prior stroke.  We discussed the following management options in detail including potential benefits, risks, and side effects: Behavioral therapy: Modify fluid intake Decreasing bladder irritants (such as caffeine) Urge suppression strategies Bladder retraining / timed voiding Double voiding Medication(s): - Not a safe candidate for anticholinergic medications due to history of stroke & TIA with memory loss and mild cognitive impairment, age, and risk for side effects.  - For beta-3 agonist medication, we discussed the risk for  urinary retention and the potential side effect of elevated blood pressure specific to Myrbetriq (which is more likely to occur in individuals with uncontrolled hypertension).   She decided to proceed with Gemtesa 75 mg daily.  Will plan for follow up in 8 weeks for symptom recheck or sooner if needed. Pt verbalized understanding and agreement. All questions were answered.  PLAN Advised the following: 1. Start Gemtesa 75 mg daily. Samples provided today. 2. Minimize caffeine intake. 3. Return in about 8 weeks (around 07/11/2023) for f/u with Evette Georges NP. Possible I&O cath if symptomatic for UTI.  Orders Placed This Encounter  Procedures   Urinalysis, Routine w reflex microscopic   In and Out Cath    It has been explained that the patient is to follow regularly with their PCP in addition to all other providers involved in their care and to follow instructions provided by these respective offices. Patient advised to contact urology clinic if any urologic-pertaining questions, concerns, new symptoms or problems arise in the interim period.  Patient Instructions       Overactive bladder (OAB) overview for patients:  Symptoms may include: urinary urgency ("gotta go" feeling) urinary frequency (voiding >8 times per day) night time urination (nocturia) urge incontinence of  urine (UUI)  While we do not know the exact etiology of OAB, several treatment options exist including:  Behavioral therapy: Reducing fluid intake Decreasing bladder stimulants (such as caffeine) and irritants (such as acidic food, spicy foods, alcohol) Urge suppression strategies Bladder retraining via timed voiding  Pelvic floor physical therapy  Medication(s) - can use one or both of the drug classes below. Anticholinergic / antimuscarinic medications:  Mechanism of action: Activate M3 receptors to reduce detrusor stimulation and increase bladder capacity   (parasympathetic nervous system). Effect: Relaxes the bladder to decrease overactivity, increase bladder storage capacity, and increase time between voids. Onset: Slow acting (may take 8-12 weeks to determine efficacy). Medications include: Vesicare (Solifenacin), Ditropan (Oxybutynin), Detrol (Tolterodine), Toviaz (Fesoterodine), Sanctura (Trospium), Urispas (Flavoxate), Enablex (Darifenacin), Bentyl (Dicyclomine), Levsin (Hyoscyamine ). Potential side effects include but are not limited to: Dry eyes, dry mouth, constipation, cognitive impairment, dementia risk with long term use, and urinary retention/ incomplete bladder emptying. Insurance companies generally prefer for patients to try 1-2 anticholinergic / antimuscarinic medications first due to low cost. Some exceptions are made based on patient-specific comorbidities / risk factors. Beta-3 agonist medications: Mechanism of action: Stimulates selective B3 adrenergic receptors to cause smooth muscle bladder relaxation (sympathetic nervous system). Effect: Relaxes the bladder to decrease overactivity, increase bladder storage capacity, and increase time between voids. Onset: Slow acting (may take 8-12 weeks to determine efficacy). Medications include: Myrbetriq (Mirabegron) and Vibegron Leslye Peer). Potential side effects include but are not limited to: urinary retention /  incomplete bladder emptying and elevated blood pressure (more likely to occur in individuals with pre-existing uncontrolled hypertension). These medications tend to be more expensive than the anticholinergic / antimuscarinic medications.   For patients with refractory OAB (if the above treatment options have been unsuccessful): Posterior tibial nerve stimulation (PTNS). Small acupuncture-type needle inserted near ankle with electric current to stimulate bladder via posterior tibial nerve pathway. Initially requires 12 weekly in-office treatments lasting 30 minutes each; followed by monthly in-office treatments lasting 30 minutes each for 1 year.  Bladder Botox injections. How it is done: Typically done via in-office cystoscopy; sometimes done in the OR depending on the situation. The bladder is numbed with lidocaine instilled via a catheter. Then the urologist injects Botox  into the bladder muscle wall in about 20 locations. Causes local paralysis of the bladder muscle at the injection sites to reduce bladder muscle overactivity / spasms. The effect lasts for approximately 6 months and cannot be reversed once performed. Risks may included but are not limited to: infection, incomplete bladder emptying/ urinary retention, short term need for self-catheterization or indwelling catheter, and need for repeat therapy. There is a 5-12% chance of needing to catheterize with Botox - that usually resolves in a few months as the Botox wears off. Typically Botox injections would need to be repeated every 3-12 months since this is not a permanent therapy.  Sacral neuromodulation trial (Medtronic lnterStim or Axonics implant). Sacral neuromodulation is FDA-approved for uncontrolled urinary urgency, urinary frequency, urinary urge incontinence, non-obstructive urinary retention, or fecal incontinence. It is not FDA-approved as a treatment for pain. The goal of this therapy is at least a 50% improvement in symptoms.  It is NOT realistic to expect a 100% cure. This is a a 2-step outpatient procedure. After a successful test period, a permanent wire and generator are placed in the OR. We discussed the risk of infection. We reviewed the fact that about 30% of patients fail the test phase and are not candidates for permanent generator placement. During the 1-2 week trial phase, symptoms are documented by the patient to determine response. If patient gets at least a 50% improvement in symptoms, they may then proceed with Step 2. Step 1: Trial lead placement. Per physician discretion, may done one of two ways: Percutaneous nerve evaluation (PNE) in the [facility] urology office. Performed by urologist under local anesthesia (numbing the area with lidocaine) using a spinal needle for placement of test wire, which usually stays in place for 5-7 days to determine therapy response. Test lead placement in OR under anesthesia. Usually stays in place 2 weeks to determine therapy response. > Step 2: Permanent implantation of sacral neuromodulation device, which is performed in the OR.  Sacral neuromodulation implants: All are conditionally MRI safe. Manufacturer: Medtronic Website: [employer]gl therapy/right-for-you.html Options: lnterStim X: Non-rechargeable. The battery lasts 10 years on average. lnterStim Micro: Rechargeable. The battery lasts 15 years on average and must be charged routinely. Approximately 50% smaller implant than lnterStim X implant.  Manufacturer: Axonics Website: Findrealrelief.axonics.com Options: Non-rechargeable (Axonics F15): The battery lasts 15 years on average. Rechargeable (Axonics R20): The battery lasts 20 years on average and must be charged in office for about 1 hour every 6-10 months on average. Approximately 50% smaller implant than Axonics non-rechargeable implant.  Note: Generally the rechargeable devices  are only advised for very small or thin patients who may not have sufficient adipose tissue to comfortably overlay the implanted device.  Suprapubic catheter (SP tube) placement. Only done in severely refractory OAB when all other options have failed or are not a viable treatment choice depending on patient factors. Involves placement of a catheter through the lower abdomen into the bladder to continuously drain the bladder into an external collection bag, which patient can then empty at their convenience every few hours. Done via an outpatient surgical procedure in the OR under anesthesia. Risks may included but are not limited to: surgical site pain, infections, skin irritation / breakdown, chronic bacteriuria, symptomatic UTls. The SP tube must stay in place continuously. This is a reversible procedure however - the insertion site will close if catheter is removed for more than a few hours. The SP tube must be exchanged routinely every 4 weeks to prevent the catheter  from becoming clogged with sediment. SP tube exchanges are typically performed at a urology nurse visit or by a home health nurse.   Electronically signed by:  Donnita Falls, FNP   05/16/23    4:50 PM

## 2023-05-11 ENCOUNTER — Other Ambulatory Visit: Payer: Self-pay | Admitting: Family Medicine

## 2023-05-16 ENCOUNTER — Encounter: Payer: Self-pay | Admitting: Urology

## 2023-05-16 ENCOUNTER — Ambulatory Visit: Payer: Medicare Other | Admitting: Urology

## 2023-05-16 VITALS — BP 145/71 | HR 73 | Temp 98.4°F

## 2023-05-16 DIAGNOSIS — N3946 Mixed incontinence: Secondary | ICD-10-CM

## 2023-05-16 DIAGNOSIS — N819 Female genital prolapse, unspecified: Secondary | ICD-10-CM

## 2023-05-16 DIAGNOSIS — N904 Leukoplakia of vulva: Secondary | ICD-10-CM

## 2023-05-16 DIAGNOSIS — R3915 Urgency of urination: Secondary | ICD-10-CM

## 2023-05-16 DIAGNOSIS — N3941 Urge incontinence: Secondary | ICD-10-CM

## 2023-05-16 DIAGNOSIS — N393 Stress incontinence (female) (male): Secondary | ICD-10-CM

## 2023-05-16 DIAGNOSIS — R8271 Bacteriuria: Secondary | ICD-10-CM

## 2023-05-16 DIAGNOSIS — N952 Postmenopausal atrophic vaginitis: Secondary | ICD-10-CM

## 2023-05-16 DIAGNOSIS — Z960 Presence of urogenital implants: Secondary | ICD-10-CM

## 2023-05-16 DIAGNOSIS — N3281 Overactive bladder: Secondary | ICD-10-CM | POA: Diagnosis not present

## 2023-05-16 DIAGNOSIS — R350 Frequency of micturition: Secondary | ICD-10-CM | POA: Diagnosis not present

## 2023-05-16 DIAGNOSIS — N390 Urinary tract infection, site not specified: Secondary | ICD-10-CM

## 2023-05-16 LAB — URINALYSIS, ROUTINE W REFLEX MICROSCOPIC
Bilirubin, UA: NEGATIVE
Glucose, UA: NEGATIVE
Ketones, UA: NEGATIVE
Leukocytes,UA: NEGATIVE
Nitrite, UA: NEGATIVE
Protein,UA: NEGATIVE
RBC, UA: NEGATIVE
Specific Gravity, UA: 1.015 (ref 1.005–1.030)
Urobilinogen, Ur: 0.2 mg/dL (ref 0.2–1.0)
pH, UA: 6 (ref 5.0–7.5)

## 2023-05-16 MED ORDER — GEMTESA 75 MG PO TABS
1.0000 | ORAL_TABLET | Freq: Every day | ORAL | 5 refills | Status: DC
Start: 2023-05-16 — End: 2023-05-25

## 2023-05-16 NOTE — Progress Notes (Signed)
Pt is prepped for and in and out catherization. Patient was cleaned and prepped in a sterle fashion with betadine. A 14 fr catheter foley was inserted. Urine return was note 40 ml.  Performed by [facility] LPN

## 2023-05-16 NOTE — Patient Instructions (Signed)
Overactive bladder (OAB) overview for patients:  Symptoms may include: urinary urgency ("gotta go" feeling) urinary frequency (voiding >8 times per day) night time urination (nocturia) urge incontinence of urine (UUI)  While we do not know the exact etiology of OAB, several treatment options exist including:  Behavioral therapy: Reducing fluid intake Decreasing bladder stimulants (such as caffeine) and irritants (such as acidic food, spicy foods, alcohol) Urge suppression strategies Bladder retraining via timed voiding  Pelvic floor physical therapy  Medication(s) - can use one or both of the drug classes below. Anticholinergic / antimuscarinic medications:  Mechanism of action: Activate M3 receptors to reduce detrusor stimulation and increase bladder capacity  (parasympathetic nervous system). Effect: Relaxes the bladder to decrease overactivity, increase bladder storage capacity, and increase time between voids. Onset: Slow acting (may take 8-12 weeks to determine efficacy). Medications include: Vesicare (Solifenacin), Ditropan (Oxybutynin), Detrol (Tolterodine), Toviaz (Fesoterodine), Sanctura (Trospium), Urispas (Flavoxate), Enablex (Darifenacin), Bentyl (Dicyclomine), Levsin (Hyoscyamine ). Potential side effects include but are not limited to: Dry eyes, dry mouth, constipation, cognitive impairment, dementia risk with long term use, and urinary retention/ incomplete bladder emptying. Insurance companies generally prefer for patients to try 1-2 anticholinergic / antimuscarinic medications first due to low cost. Some exceptions are made based on patient-specific comorbidities / risk factors. Beta-3 agonist medications: Mechanism of action: Stimulates selective B3 adrenergic receptors to cause smooth muscle bladder relaxation (sympathetic nervous system). Effect: Relaxes the bladder to decrease overactivity, increase bladder storage capacity, and increase time  between voids. Onset: Slow acting (may take 8-12 weeks to determine efficacy). Medications include: Myrbetriq (Mirabegron) and Vibegron Leslye Peer). Potential side effects include but are not limited to: urinary retention / incomplete bladder emptying and elevated blood pressure (more likely to occur in individuals with pre-existing uncontrolled hypertension). These medications tend to be more expensive than the anticholinergic / antimuscarinic medications.   For patients with refractory OAB (if the above treatment options have been unsuccessful): Posterior tibial nerve stimulation (PTNS). Small acupuncture-type needle inserted near ankle with electric current to stimulate bladder via posterior tibial nerve pathway. Initially requires 12 weekly in-office treatments lasting 30 minutes each; followed by monthly in-office treatments lasting 30 minutes each for 1 year.  Bladder Botox injections. How it is done: Typically done via in-office cystoscopy; sometimes done in the OR depending on the situation. The bladder is numbed with lidocaine instilled via a catheter. Then the urologist injects Botox into the bladder muscle wall in about 20 locations. Causes local paralysis of the bladder muscle at the injection sites to reduce bladder muscle overactivity / spasms. The effect lasts for approximately 6 months and cannot be reversed once performed. Risks may included but are not limited to: infection, incomplete bladder emptying/ urinary retention, short term need for self-catheterization or indwelling catheter, and need for repeat therapy. There is a 5-12% chance of needing to catheterize with Botox - that usually resolves in a few months as the Botox wears off. Typically Botox injections would need to be repeated every 3-12 months since this is not a permanent therapy.  Sacral neuromodulation trial (Medtronic lnterStim or Axonics implant). Sacral neuromodulation is FDA-approved for uncontrolled urinary  urgency, urinary frequency, urinary urge incontinence, non-obstructive urinary retention, or fecal incontinence. It is not FDA-approved as a treatment for pain. The goal of this therapy is at least a 50% improvement in symptoms. It is NOT realistic to expect a 100% cure. This is a a 2-step outpatient procedure. After a successful  test period, a permanent wire and generator are placed in the OR. We discussed the risk of infection. We reviewed the fact that about 30% of patients fail the test phase and are not candidates for permanent generator placement. During the 1-2 week trial phase, symptoms are documented by the patient to determine response. If patient gets at least a 50% improvement in symptoms, they may then proceed with Step 2. Step 1: Trial lead placement. Per physician discretion, may done one of two ways: Percutaneous nerve evaluation (PNE) in the [facility] urology office. Performed by urologist under local anesthesia (numbing the area with lidocaine) using a spinal needle for placement of test wire, which usually stays in place for 5-7 days to determine therapy response. Test lead placement in OR under anesthesia. Usually stays in place 2 weeks to determine therapy response. > Step 2: Permanent implantation of sacral neuromodulation device, which is performed in the OR.  Sacral neuromodulation implants: All are conditionally MRI safe. Manufacturer: Medtronic Website: [employer] therapy/right-for-you.html Options: lnterStim X: Non-rechargeable. The battery lasts 10 years on average. lnterStim Micro: Rechargeable. The battery lasts 15 years on average and must be charged routinely. Approximately 50% smaller implant than lnterStim X implant.  Manufacturer: Axonics Website: [employer]realrel[employer]axonics[employer]com Options: Non-rechargeable (Axonics F15): The battery lasts 15 years on average. Rechargeable (Axonics R20):  The battery lasts 20 years on average and must be charged in office for about 1 hour every 6-10 months on average. Approximately 50% smaller implant than Axonics non-rechargeable implant.  Note: Generally the rechargeable devices are only advised for very small or thin patients who may not have sufficient adipose tissue to comfortably overlay the implanted device.  Suprapubic catheter (SP tube) placement. Only done in severely refractory OAB when all other options have failed or are not a viable treatment choice depending on patient factors. Involves placement of a catheter through the lower abdomen into the bladder to continuously drain the bladder into an external collection bag, which patient can then empty at their convenience every few hours. Done via an outpatient surgical procedure in the OR under anesthesia. Risks may included but are not limited to: surgical site pain, infections, skin irritation / breakdown, chronic bacteriuria, symptomatic UTls. The SP tube must stay in place continuously. This is a reversible procedure however - the insertion site will close if catheter is removed for more than a few hours. The SP tube must be exchanged routinely every 4 weeks to prevent the catheter from becoming clogged with sediment. SP tube exchanges are typically performed at a urology nurse visit or by a home health nurse.

## 2023-05-19 DIAGNOSIS — Z23 Encounter for immunization: Secondary | ICD-10-CM | POA: Diagnosis not present

## 2023-05-24 DIAGNOSIS — N952 Postmenopausal atrophic vaginitis: Secondary | ICD-10-CM | POA: Diagnosis not present

## 2023-05-24 DIAGNOSIS — L900 Lichen sclerosus et atrophicus: Secondary | ICD-10-CM | POA: Diagnosis not present

## 2023-05-24 DIAGNOSIS — Z4689 Encounter for fitting and adjustment of other specified devices: Secondary | ICD-10-CM | POA: Diagnosis not present

## 2023-05-24 DIAGNOSIS — N8110 Cystocele, unspecified: Secondary | ICD-10-CM | POA: Diagnosis not present

## 2023-05-25 ENCOUNTER — Encounter: Payer: Self-pay | Admitting: Urology

## 2023-05-25 ENCOUNTER — Other Ambulatory Visit: Payer: Self-pay | Admitting: Urology

## 2023-05-25 NOTE — Telephone Encounter (Signed)
Please see patient message below, I advised to discontinue medication for now.  Do you recommend and alternative?

## 2023-05-31 ENCOUNTER — Other Ambulatory Visit: Payer: Self-pay | Admitting: Family Medicine

## 2023-05-31 DIAGNOSIS — M79675 Pain in left toe(s): Secondary | ICD-10-CM | POA: Diagnosis not present

## 2023-05-31 DIAGNOSIS — L110 Acquired keratosis follicularis: Secondary | ICD-10-CM | POA: Diagnosis not present

## 2023-05-31 DIAGNOSIS — M79674 Pain in right toe(s): Secondary | ICD-10-CM | POA: Diagnosis not present

## 2023-05-31 DIAGNOSIS — I739 Peripheral vascular disease, unspecified: Secondary | ICD-10-CM | POA: Diagnosis not present

## 2023-05-31 DIAGNOSIS — M79671 Pain in right foot: Secondary | ICD-10-CM | POA: Diagnosis not present

## 2023-05-31 DIAGNOSIS — M79672 Pain in left foot: Secondary | ICD-10-CM | POA: Diagnosis not present

## 2023-06-12 DIAGNOSIS — S92514D Nondisplaced fracture of proximal phalanx of right lesser toe(s), subsequent encounter for fracture with routine healing: Secondary | ICD-10-CM | POA: Diagnosis not present

## 2023-07-06 DIAGNOSIS — N289 Disorder of kidney and ureter, unspecified: Secondary | ICD-10-CM | POA: Diagnosis not present

## 2023-07-06 DIAGNOSIS — I10 Essential (primary) hypertension: Secondary | ICD-10-CM | POA: Diagnosis not present

## 2023-07-06 NOTE — Progress Notes (Signed)
Name: Becky Baird DOB: 1933/09/17 MRN: [id number]  History of Present Illness: Ms. Schuenke is a 89 y.o. female who presents today for follow up visit at [facility] [place]. She is accompanied by her daughter Aggie Cosier. - GU history: 1. Recurrent UTI. - Prior episode of sepsis in September 2019. - Requires I&O cath urine specimen any time UTI is suspected. On 05/16/2023 her voided urine sample appeared infected however I&O catheterized urine sample was normal. Based on this finding it is highly likely that her voided urine specimens are unreliable for evaluation of UTI due to vaginal contamination.  2. OAB with urinary frequency, nocturia, urgency, and urge incontinence. 3. Stress urinary incontinence. Improved with pessary. Not significantly bothersome per patient. 4. Pelvic organ prolapse (cystocele). - Has a #5 incontinence ring pessary which is managed by her GYN provider. 5. Vaginal atrophy. Uses topical vaginal estrogen cream as prescribed by her gynecologist. 6. Lichen sclerosus. Uses Clobetasol as prescribed by her gynecologist.   Urine culture results in past 12 months: - 11/11/2022: Negative - 11/21/2022: Positive for Pseudomonas aeruginosa - 01/05/2023: Positive for Enterobacter cloacae complex - 02/07/2023: Positive for E. Coli; asymptomatic (no antibiotic prescribed) - 04/12/2023: Positive for Proteus mirabilis; asymptomatic (no antibiotic prescribed) - 05/03/2023: Positive for Proteus mirabilis; asymptomatic (treated with Cipro by GYN provider)  At last visit on 05/16/2023: - Chief complaint of OAB symptoms. - Her voided urine sample appeared grossly abnormal however I&O catheterized urine sample was normal. Based on this finding it is highly likely that her voided urine specimens are unreliable for evaluation of UTI due to vaginal contamination. She was therefore advised to request I&O catheter for urine specimen collection any time UTI is suspected.  - The  plan was: 1. Start Gemtesa 75 mg daily. Samples provided. 2. Minimize caffeine intake. 3. Return in about 8 weeks (around 07/11/2023) for f/u with Evette Georges NP. Possible I&O cath if symptomatic for UTI.  Since last visit: > 05/25/23:  - Gemtesa discontinued due to itching/burning sensation all over body. Gemtesa added to allergy list. - Myrbetriq is in same drug class as Gemtesa; not advised due to risk for same adverse reaction. - Not a safe candidate for anticholinergic medications due to history of stroke & TIA with memory loss and mild cognitive impairment, age, and risk for side effects. - Offered consideration of 3rd line OAB therapy such as PTNS, bladder Botox, or sacral neuromodulation. - Patient elected to hold off until next visit for further discussion.  Today: She reports ongoing urinary frequency, nocturia, urgency, and urge incontinence. Denies acute UTI symptoms including acute fever, fatigue, weakness, increased confusion, increased urgency/frequency above baseline, dysuria, or gross hematuria.   Fall Screening: Do you usually have a device to assist in your mobility? Yes   Medications: Current Outpatient Medications  Medication Sig Dispense Refill   ALPRAZolam (XANAX) 0.25 MG tablet 1 THREE TIMES DAILY AS NEEDED 90 tablet 2   clobetasol ointment (TEMOVATE) 0.05 % Apply 1 application topically 2 (two) times daily as needed.     cyanocobalamin (VITAMIN B12) 1000 MCG tablet Take 1 tablet (1,000 mcg total) by mouth daily. 30 tablet 0   estradiol (ESTRACE) 0.1 MG/GM vaginal cream Place 1 Applicatorful vaginally once a week.     hydrochlorothiazide (HYDRODIURIL) 25 MG tablet take 1 tablet by mouth once daily stop amlodipine 90 tablet 0   Ketotifen Fumarate (ALAWAY OP) Apply 1 drop to eye daily as needed.     loratadine (CLARITIN) 10 MG tablet  Take 1 tablet (10 mg total) by mouth daily. (Patient taking differently: Take 10 mg by mouth at bedtime.) 30 tablet 5   OVER THE  COUNTER MEDICATION Take 1 capsule by mouth every morning. SUPREMA DOPHILUS     OVER THE COUNTER MEDICATION Take by mouth daily. Vitamin D3 2,000 units daily     pantoprazole (PROTONIX) 40 MG tablet take 1 tablet by mouth daily 30 tablet 0   potassium chloride (KLOR-CON M) 10 MEQ tablet TAKE ONE TABLET BY MOUTH TWICE DAILY 60 tablet 2   potassium chloride (KLOR-CON) 10 MEQ tablet take 1 tablet by mouth twice daily 60 tablet 0   sodium chloride (OCEAN) 0.65 % nasal spray Place 1 spray into the nose as needed for congestion.      XARELTO 15 MG TABS tablet take 1 tablet by mouth daily with supper 90 tablet 0   No current facility-administered medications for this visit.    Allergies: Allergies  Allergen Reactions   Amlodipine     Severe pedal edema with 2.5 mg use   Gemtesa [Vibegron] Itching    "allover body itching and burning"   Statins Other (See Comments)    Myalgias with rosuvastatin   Neurontin [Gabapentin]     Cognitive issues, creates fogginess   Niaspan [Niacin Er (Antihyperlipidemic)] Rash   Oxybutynin     Nausea sweats    Past Medical History:  Diagnosis Date   Anxiety    Chronic back pain    Hyperlipidemia    Hypertension    Memory loss    Mild cognitive impairment, so stated    Occlusion and stenosis of carotid artery with cerebral infarction    Bilateral less than 50% carotid stenosis.   Persistent atrial fibrillation (HCC) 06/23/2016   Renal cyst    Sciatica    Stroke (HCC)    2010, RMCA with stent   Toe fracture 11/2015   R toe   Ulcer 1970s   Past Surgical History:  Procedure Laterality Date   BIOPSY  04/25/2018   Procedure: BIOPSY;  Surgeon: Malissa Hippo, MD;  [facility]: [facility];  Service: Endoscopy;;  gastric polyps   COLONOSCOPY  03/10/2011   Procedure: COLONOSCOPY;  Surgeon: Malissa Hippo, MD;  Location: [facility];  Service: Endoscopy;  Laterality: N/A;   COLONOSCOPY N/A 08/24/2016   Procedure: COLONOSCOPY;  Surgeon: Malissa Hippo,  MD;  Location: [facility];  Service: Endoscopy;  Laterality: N/A;  930   DILATION AND CURETTAGE OF UTERUS     years ago   ESOPHAGOGASTRODUODENOSCOPY N/A 04/25/2018   Procedure: ESOPHAGOGASTRODUODENOSCOPY (EGD);  Surgeon: Malissa Hippo, MD;  Location: [facility];  Service: Endoscopy;  Laterality: N/A;  1:15   OVARIAN CYST REMOVAL     at least 10 years ago   POLYPECTOMY  08/24/2016   Procedure: POLYPECTOMY;  Surgeon: Malissa Hippo, MD;  Location: [facility];  Service: Endoscopy;;  ascending colon; hepatic flexure   TONSILLECTOMY     childhood   Family History  Problem Relation Age of Onset   Stroke Mother    Heart failure Father    Diabetes Brother    Social History   Socioeconomic History   Marital status: Widowed    Spouse name: Not on file   Number of children: 2   Years of education: College   Highest education level: 12th grade  Occupational History    Employer: RETIRED  Tobacco Use   Smoking status: Never  Smokeless tobacco: Never  Vaping Use   Vaping status: Never Used  Substance and Sexual Activity   Alcohol use: No    Alcohol/week: 0.0 standard drinks of alcohol   Drug use: No   Sexual activity: Not Currently  Other Topics Concern   Not on file  Social History Narrative   Patient lives at home alone.    2 "wonderful" daughters per pt.    3 grandchildren.   Caffeine Use: very little, rarely   Social Drivers of Corporate investment banker Strain: Low Risk  (06/08/2021)   Overall Financial Resource Strain (CARDIA)    Difficulty of Paying Living Expenses: Not hard at all  Food Insecurity: No Food Insecurity (06/08/2021)   Hunger Vital Sign    Worried About Running Out of Food in the Last Year: Never true    Ran Out of Food in the Last Year: Never true  Transportation Needs: No Transportation Needs (06/08/2021)   PRAPARE - Administrator, Civil Service (Medical): No    Lack of Transportation (Non-Medical): No  Physical Activity:  Insufficiently Active (06/08/2021)   Exercise Vital Sign    Days of Exercise per Week: 5 days    Minutes of Exercise per Session: 20 min  Stress: No Stress Concern Present (06/08/2021)   [employer] of Occupational Health - Occupational Stress Questionnaire    Feeling of Stress : Not at all  Social Connections: Socially Isolated (06/08/2021)   Social Connection and Isolation Panel [NHANES]    Frequency of Communication with Friends and Family: More than three times a week    Frequency of Social Gatherings with Friends and Family: More than three times a week    Attends Religious Services: Never    Database administrator or Organizations: No    Attends Banker Meetings: Never    Marital Status: Widowed  Intimate Partner Violence: Not At Risk (06/08/2021)   Humiliation, Afraid, Rape, and Kick questionnaire    Fear of Current or Ex-Partner: No    Emotionally Abused: No    Physically Abused: No    Sexually Abused: No    Review of Systems Constitutional: Patient denies any unintentional weight loss or change in strength lntegumentary: Patient denies any rashes or pruritus Cardiovascular: Patient denies chest pain or syncope Respiratory: Patient denies shortness of breath Gastrointestinal: Patient denies nausea, vomiting, constipation, or diarrhea Musculoskeletal: Patient denies muscle cramps or weakness Neurologic: Patient denies convulsions or seizures Allergic/Immunologic: Patient denies recent allergic reaction(s) Hematologic/Lymphatic: Patient denies bleeding tendencies Endocrine: Patient denies heat/cold intolerance  GU: As per HPI.  OBJECTIVE Vitals:   07/11/23 1445  BP: 128/72  Pulse: 71  Temp: 97.7 F (36.5 C)   There is no height or weight on file to calculate BMI.  Physical Examination Constitutional: No obvious distress; patient is non-toxic appearing  Cardiovascular: No visible lower extremity edema.  Respiratory: The patient does not have  audible wheezing/stridor; respirations do not appear labored  Gastrointestinal: Abdomen non-distended Musculoskeletal: Normal ROM of UEs  Skin: No obvious rashes/open sores  Neurologic: CN 2-12 grossly intact Psychiatric: Answered questions appropriately with normal affect  Hematologic/Lymphatic/Immunologic: No obvious bruises or sites of spontaneous bleeding   ASSESSMENT OAB (overactive bladder)  Urge incontinence  Mixed stress and urge incontinence  Vaginal atrophy  Female genital prolapse, unspecified type  Vaginal pessary present  Lichen sclerosus of female genitalia  No acute findings today; nothing to suggest acute UTI, therefore patient was advised that I&O cath for  urine specimen collection is not warranted. Urine testing / cultures are not advised in the absence of acute UTI symptoms given that acute antibiotic treatment would not be indicated regardless of the findings. We discussed antibiotic stewardship and goal to minimize risk for developing antibiotic resistance. We reviewed symptoms suggestive of a possible UTI which may include: fever, confusion, malaise, increased fatigue, dysuria, acute increase in urinary frequency / urgency (significantly above baseline OAB symptoms).   We discussed overactive bladder (OAB) / possible neurogenic bladder. Her neurogenic risk factors include peripheral neuropathy, dementia, prior stroke. We reviewed management options in detail including potential benefits, risks, and side effects: Behavioral therapy: Already has tried this Medication(s): - Not a safe candidate for anticholinergic medications due to history of stroke & TIA with memory loss and mild cognitive impairment, age, and risk for side effects.  - Allergic to [facility]; not safe to trial Myrbetriq (also a beta-3 agonist medication). For refractory cases: PTNS (posterior tibial nerve stimulation): Not an option due to her peripheral neuropathy. Sacral neuromodulation trial  (Medtronic lnterStim or Axonics implant) Bladder Botox injections In extreme cases, SP tube placement  She and her daughters / family will consider bladder Botox procedure for the future and notify provider if wanting to proceed with that. Will consult Dr. Ronne Binning to see if he would do her first Botox procedure in office instead of OR because she does not want to undergo anesthesia due to her comorbidities.  Will plan for follow up in 6 months or sooner if needed. Pt verbalized understanding and agreement. All questions were answered.   PLAN Advised the following: 1. Continue to minimize caffeine intake. 2. Timed voiding. 3. Continue topical vaginal estrogen cream use. 4. Return in about 6 months (around 01/09/2024) for f/u with Evette Georges, NP.  No orders of the defined types were placed in this encounter.  Total time spent caring for the patient today was over 30 minutes. This includes time spent on the date of the visit reviewing the patient's chart before the visit, time spent during the visit, and time spent after the visit on documentation. Over 50% of that time was spent in face-to-face time with this patient for direct counseling. E&M based on time and complexity of medical decision making.  It has been explained that the patient is to follow regularly with their PCP in addition to all other providers involved in their care and to follow instructions provided by these respective offices. Patient advised to contact urology clinic if any urologic-pertaining questions, concerns, new symptoms or problems arise in the interim period.  Patient Instructions       Overactive bladder (OAB) overview for patients:  Symptoms may include: urinary urgency ("gotta go" feeling) urinary frequency (voiding >8 times per day) night time urination (nocturia) urge incontinence of urine (UUI)  While we do not know the exact etiology of OAB, several treatment options exist  including:  Behavioral therapy: Reducing fluid intake Decreasing bladder stimulants (such as caffeine) and irritants (such as acidic food, spicy foods, alcohol) Urge suppression strategies Bladder retraining via timed voiding  Pelvic floor physical therapy  Medication(s) - can use one or both of the drug classes below. Anticholinergic / antimuscarinic medications:  Mechanism of action: Activate M3 receptors to reduce detrusor stimulation and increase bladder capacity   (parasympathetic nervous system). Effect: Relaxes the bladder to decrease overactivity, increase bladder storage capacity, and increase time between voids. Onset: Slow acting (may take 8-12 weeks to determine efficacy). Medications include: Vesicare (Solifenacin), Ditropan (  Oxybutynin), Detrol (Tolterodine), Toviaz (Fesoterodine), Sanctura (Trospium), Urispas (Flavoxate), Enablex (Darifenacin), Bentyl (Dicyclomine), Levsin (Hyoscyamine ). Potential side effects include but are not limited to: Dry eyes, dry mouth, constipation, cognitive impairment, dementia risk with long term use, and urinary retention/ incomplete bladder emptying. Insurance companies generally prefer for patients to try 1-2 anticholinergic / antimuscarinic medications first due to low cost. Some exceptions are made based on patient-specific comorbidities / risk factors. Beta-3 agonist medications: Mechanism of action: Stimulates selective B3 adrenergic receptors to cause smooth muscle bladder relaxation (sympathetic nervous system). Effect: Relaxes the bladder to decrease overactivity, increase bladder storage capacity, and increase time between voids. Onset: Slow acting (may take 8-12 weeks to determine efficacy). Medications include: Myrbetriq (Mirabegron) and Vibegron Leslye Peer). Potential side effects include but are not limited to: urinary retention / incomplete bladder emptying and elevated blood pressure (more likely to occur in individuals with  pre-existing uncontrolled hypertension). These medications tend to be more expensive than the anticholinergic / antimuscarinic medications.   For patients with refractory OAB (if the above treatment options have been unsuccessful): Posterior tibial nerve stimulation (PTNS). Small acupuncture-type needle inserted near ankle with electric current to stimulate bladder via posterior tibial nerve pathway. Initially requires 12 weekly in-office treatments lasting 30 minutes each; followed by monthly in-office treatments lasting 30 minutes each for 1 year.  Bladder Botox injections. How it is done: Typically done via in-office cystoscopy; sometimes done in the OR depending on the situation. The bladder is numbed with lidocaine instilled via a catheter. Then the urologist injects Botox into the bladder muscle wall in about 20 locations. Causes local paralysis of the bladder muscle at the injection sites to reduce bladder muscle overactivity / spasms. The effect lasts for approximately 6 months and cannot be reversed once performed. Risks may included but are not limited to: infection, incomplete bladder emptying/ urinary retention, short term need for self-catheterization or indwelling catheter, and need for repeat therapy. There is a 5-12% chance of needing to catheterize with Botox - that usually resolves in a few months as the Botox wears off. Typically Botox injections would need to be repeated every 3-12 months since this is not a permanent therapy.  Sacral neuromodulation trial (Medtronic lnterStim or Axonics implant). Sacral neuromodulation is FDA-approved for uncontrolled urinary urgency, urinary frequency, urinary urge incontinence, non-obstructive urinary retention, or fecal incontinence. It is not FDA-approved as a treatment for pain. The goal of this therapy is at least a 50% improvement in symptoms. It is NOT realistic to expect a 100% cure. This is a a 2-step outpatient procedure. After a  successful test period, a permanent wire and generator are placed in the OR. We discussed the risk of infection. We reviewed the fact that about 30% of patients fail the test phase and are not candidates for permanent generator placement. During the 1-2 week trial phase, symptoms are documented by the patient to determine response. If patient gets at least a 50% improvement in symptoms, they may then proceed with Step 2. Step 1: Trial lead placement. Per physician discretion, may done one of two ways: Percutaneous nerve evaluation (PNE) in the [facility] urology office. Performed by urologist under local anesthesia (numbing the area with lidocaine) using a spinal needle for placement of test wire, which usually stays in place for 5-7 days to determine therapy response. Test lead placement in OR under anesthesia. Usually stays in place 2 weeks to determine therapy response. > Step 2: Permanent implantation of sacral neuromodulation device, which is performed in  the OR.  Sacral neuromodulation implants: All are conditionally MRI safe. Manufacturer: Medtronic Website: [employer]gl therapy/right-for-you.html Options: lnterStim X: Non-rechargeable. The battery lasts 10 years on average. lnterStim Micro: Rechargeable. The battery lasts 15 years on average and must be charged routinely. Approximately 50% smaller implant than lnterStim X implant.  Manufacturer: Axonics Website: Findrealrelief.axonics.com Options: Non-rechargeable (Axonics F15): The battery lasts 15 years on average. Rechargeable (Axonics R20): The battery lasts 20 years on average and must be charged in office for about 1 hour every 6-10 months on average. Approximately 50% smaller implant than Axonics non-rechargeable implant.  Note: Generally the rechargeable devices are only advised for very small or thin patients who may not have sufficient adipose tissue  to comfortably overlay the implanted device.  Suprapubic catheter (SP tube) placement. Only done in severely refractory OAB when all other options have failed or are not a viable treatment choice depending on patient factors. Involves placement of a catheter through the lower abdomen into the bladder to continuously drain the bladder into an external collection bag, which patient can then empty at their convenience every few hours. Done via an outpatient surgical procedure in the OR under anesthesia. Risks may included but are not limited to: surgical site pain, infections, skin irritation / breakdown, chronic bacteriuria, symptomatic UTls. The SP tube must stay in place continuously. This is a reversible procedure however - the insertion site will close if catheter is removed for more than a few hours. The SP tube must be exchanged routinely every 4 weeks to prevent the catheter from becoming clogged with sediment. SP tube exchanges are typically performed at a urology nurse visit or by a home health nurse.   Electronically signed by:  Donnita Falls, FNP   07/11/23    3:46 PM

## 2023-07-10 ENCOUNTER — Other Ambulatory Visit: Payer: Self-pay | Admitting: Family Medicine

## 2023-07-11 ENCOUNTER — Ambulatory Visit: Payer: Medicare Other | Admitting: Family Medicine

## 2023-07-11 ENCOUNTER — Encounter: Payer: Self-pay | Admitting: Family Medicine

## 2023-07-11 ENCOUNTER — Ambulatory Visit: Payer: Medicare Other | Admitting: Urology

## 2023-07-11 VITALS — BP 132/76 | Wt 129.2 lb

## 2023-07-11 VITALS — BP 128/72 | HR 71 | Temp 97.7°F

## 2023-07-11 DIAGNOSIS — N3941 Urge incontinence: Secondary | ICD-10-CM

## 2023-07-11 DIAGNOSIS — N1832 Chronic kidney disease, stage 3b: Secondary | ICD-10-CM

## 2023-07-11 DIAGNOSIS — N819 Female genital prolapse, unspecified: Secondary | ICD-10-CM

## 2023-07-11 DIAGNOSIS — N3946 Mixed incontinence: Secondary | ICD-10-CM | POA: Diagnosis not present

## 2023-07-11 DIAGNOSIS — Z960 Presence of urogenital implants: Secondary | ICD-10-CM

## 2023-07-11 DIAGNOSIS — M545 Low back pain, unspecified: Secondary | ICD-10-CM

## 2023-07-11 DIAGNOSIS — N3281 Overactive bladder: Secondary | ICD-10-CM

## 2023-07-11 DIAGNOSIS — R29898 Other symptoms and signs involving the musculoskeletal system: Secondary | ICD-10-CM | POA: Diagnosis not present

## 2023-07-11 DIAGNOSIS — N904 Leukoplakia of vulva: Secondary | ICD-10-CM

## 2023-07-11 DIAGNOSIS — R270 Ataxia, unspecified: Secondary | ICD-10-CM

## 2023-07-11 DIAGNOSIS — N952 Postmenopausal atrophic vaginitis: Secondary | ICD-10-CM

## 2023-07-11 LAB — OSMOLALITY, URINE: Osmolality, Ur: 464 mosm/kg

## 2023-07-11 LAB — BASIC METABOLIC PANEL
BUN/Creatinine Ratio: 21 (ref 12–28)
BUN: 34 mg/dL — ABNORMAL HIGH (ref 8–27)
CO2: 23 mmol/L (ref 20–29)
Calcium: 10 mg/dL (ref 8.7–10.3)
Chloride: 101 mmol/L (ref 96–106)
Creatinine, Ser: 1.61 mg/dL — ABNORMAL HIGH (ref 0.57–1.00)
Glucose: 87 mg/dL (ref 70–99)
Potassium: 4.7 mmol/L (ref 3.5–5.2)
Sodium: 140 mmol/L (ref 134–144)
eGFR: 31 mL/min/{1.73_m2} — ABNORMAL LOW (ref >59)

## 2023-07-11 LAB — SODIUM, URINE, RANDOM: Sodium, Ur: 47 mmol/L

## 2023-07-11 MED ORDER — ALPRAZOLAM 0.25 MG PO TABS: ORAL_TABLET | ORAL | 5 refills | Status: DC

## 2023-07-11 NOTE — Patient Instructions (Signed)
Overactive bladder (OAB) overview for patients:  Symptoms may include: urinary urgency ("gotta go" feeling) urinary frequency (voiding >8 times per day) night time urination (nocturia) urge incontinence of urine (UUI)  While we do not know the exact etiology of OAB, several treatment options exist including:  Behavioral therapy: Reducing fluid intake Decreasing bladder stimulants (such as caffeine) and irritants (such as acidic food, spicy foods, alcohol) Urge suppression strategies Bladder retraining via timed voiding  Pelvic floor physical therapy  Medication(s) - can use one or both of the drug classes below. Anticholinergic / antimuscarinic medications:  Mechanism of action: Activate M3 receptors to reduce detrusor stimulation and increase bladder capacity  (parasympathetic nervous system). Effect: Relaxes the bladder to decrease overactivity, increase bladder storage capacity, and increase time between voids. Onset: Slow acting (may take 8-12 weeks to determine efficacy). Medications include: Vesicare (Solifenacin), Ditropan (Oxybutynin), Detrol (Tolterodine), Toviaz (Fesoterodine), Sanctura (Trospium), Urispas (Flavoxate), Enablex (Darifenacin), Bentyl (Dicyclomine), Levsin (Hyoscyamine ). Potential side effects include but are not limited to: Dry eyes, dry mouth, constipation, cognitive impairment, dementia risk with long term use, and urinary retention/ incomplete bladder emptying. Insurance companies generally prefer for patients to try 1-2 anticholinergic / antimuscarinic medications first due to low cost. Some exceptions are made based on patient-specific comorbidities / risk factors. Beta-3 agonist medications: Mechanism of action: Stimulates selective B3 adrenergic receptors to cause smooth muscle bladder relaxation (sympathetic nervous system). Effect: Relaxes the bladder to decrease overactivity, increase bladder storage capacity, and increase time  between voids. Onset: Slow acting (may take 8-12 weeks to determine efficacy). Medications include: Myrbetriq (Mirabegron) and Vibegron Leslye Peer). Potential side effects include but are not limited to: urinary retention / incomplete bladder emptying and elevated blood pressure (more likely to occur in individuals with pre-existing uncontrolled hypertension). These medications tend to be more expensive than the anticholinergic / antimuscarinic medications.   For patients with refractory OAB (if the above treatment options have been unsuccessful): Posterior tibial nerve stimulation (PTNS). Small acupuncture-type needle inserted near ankle with electric current to stimulate bladder via posterior tibial nerve pathway. Initially requires 12 weekly in-office treatments lasting 30 minutes each; followed by monthly in-office treatments lasting 30 minutes each for 1 year.  Bladder Botox injections. How it is done: Typically done via in-office cystoscopy; sometimes done in the OR depending on the situation. The bladder is numbed with lidocaine instilled via a catheter. Then the urologist injects Botox into the bladder muscle wall in about 20 locations. Causes local paralysis of the bladder muscle at the injection sites to reduce bladder muscle overactivity / spasms. The effect lasts for approximately 6 months and cannot be reversed once performed. Risks may included but are not limited to: infection, incomplete bladder emptying/ urinary retention, short term need for self-catheterization or indwelling catheter, and need for repeat therapy. There is a 5-12% chance of needing to catheterize with Botox - that usually resolves in a few months as the Botox wears off. Typically Botox injections would need to be repeated every 3-12 months since this is not a permanent therapy.  Sacral neuromodulation trial (Medtronic lnterStim or Axonics implant). Sacral neuromodulation is FDA-approved for uncontrolled urinary  urgency, urinary frequency, urinary urge incontinence, non-obstructive urinary retention, or fecal incontinence. It is not FDA-approved as a treatment for pain. The goal of this therapy is at least a 50% improvement in symptoms. It is NOT realistic to expect a 100% cure. This is a a 2-step outpatient procedure. After a successful  test period, a permanent wire and generator are placed in the OR. We discussed the risk of infection. We reviewed the fact that about 30% of patients fail the test phase and are not candidates for permanent generator placement. During the 1-2 week trial phase, symptoms are documented by the patient to determine response. If patient gets at least a 50% improvement in symptoms, they may then proceed with Step 2. Step 1: Trial lead placement. Per physician discretion, may done one of two ways: Percutaneous nerve evaluation (PNE) in the [facility] urology office. Performed by urologist under local anesthesia (numbing the area with lidocaine) using a spinal needle for placement of test wire, which usually stays in place for 5-7 days to determine therapy response. Test lead placement in OR under anesthesia. Usually stays in place 2 weeks to determine therapy response. > Step 2: Permanent implantation of sacral neuromodulation device, which is performed in the OR.  Sacral neuromodulation implants: All are conditionally MRI safe. Manufacturer: Medtronic Website: [employer] therapy/right-for-you.html Options: lnterStim X: Non-rechargeable. The battery lasts 10 years on average. lnterStim Micro: Rechargeable. The battery lasts 15 years on average and must be charged routinely. Approximately 50% smaller implant than lnterStim X implant.  Manufacturer: Axonics Website: [employer]realrel[employer]axonics[employer]com Options: Non-rechargeable (Axonics F15): The battery lasts 15 years on average. Rechargeable (Axonics R20):  The battery lasts 20 years on average and must be charged in office for about 1 hour every 6-10 months on average. Approximately 50% smaller implant than Axonics non-rechargeable implant.  Note: Generally the rechargeable devices are only advised for very small or thin patients who may not have sufficient adipose tissue to comfortably overlay the implanted device.  Suprapubic catheter (SP tube) placement. Only done in severely refractory OAB when all other options have failed or are not a viable treatment choice depending on patient factors. Involves placement of a catheter through the lower abdomen into the bladder to continuously drain the bladder into an external collection bag, which patient can then empty at their convenience every few hours. Done via an outpatient surgical procedure in the OR under anesthesia. Risks may included but are not limited to: surgical site pain, infections, skin irritation / breakdown, chronic bacteriuria, symptomatic UTls. The SP tube must stay in place continuously. This is a reversible procedure however - the insertion site will close if catheter is removed for more than a few hours. The SP tube must be exchanged routinely every 4 weeks to prevent the catheter from becoming clogged with sediment. SP tube exchanges are typically performed at a urology nurse visit or by a home health nurse.

## 2023-07-11 NOTE — Progress Notes (Addendum)
Subjective:    Patient ID: Becky Baird, female    DOB: [birth year], 89 y.o.   MRN: [id number]   History of Present Illness   The patient, an 89 year old with a history of a recent foot fracture, presents with concerns about posture and back pain. She has been engaging in daily bed-based exercises, including leg stretches and arm raises, to maintain mobility and strength. However, she has noticed a persistent forward lean in her posture, particularly when walking, which she attributes to reliance on a cane for support. This posture is associated with pain at the top of her hips, particularly when standing or walking. The patient has ceased her exercises for the past week due to concerns about a possible interaction with her pessary, as she noticed intermittent spotting.  The patient also reports a stable mood, employing daily positive affirmations and singing to maintain a positive outlook. She is managing her anxiety with Alprazolam, taking one full tablet at night for sleep and half a tablet in the morning, with additional half tablets as needed for anxiety throughout the day.  The patient lives independently, with a cleaning service visiting once a month. She manages her own meals, primarily consuming sandwiches, and has family members who occasionally bring meals. She has been making efforts to stay hydrated, aiming to drink 20 ounces of water daily.     Most recent labs reviewed Creatinine has gone up Patient states she did not drink any liquids the day she had her labs drawn In addition this patient has history of hyponatremia but most recent sodium and urine sodium looks good   Patient has a difficult time walking around.  She has low back pain and discomfort that keeps her hunched forward.  He creates significant imbalance for her.  She has a hard time negotiating with a cane.  She feels multiple different times that she is going to fall She is requesting evaluation for home physical  therapy.  Patient is housebound Review of Systems     Objective:    Physical Exam        General-in no acute distress Eyes-no discharge Lungs-respiratory rate normal, CTA CV-no murmurs,RRR Extremities skin warm dry does have mild edema in the lower legs not severe Neuro grossly normal Behavior normal, alert   Patient was observed walking in the hallway she does have significant kyphosis and difficulty straightening in her lower back which is contributing to her being hunched over when she walks.  When she walks she is unable to pick her feet up much and this is contributing to instability     Assessment & Plan:  Assessment and Plan    Postural Instability Patient reports difficulty standing upright and walking due to back pain. She has been performing exercises in bed to improve strength and posture. -Consider referral to physical therapy for further evaluation and treatment.  Anxiety Patient reports taking Alprazolam 0.5mg  twice daily and 1mg  at night for anxiety management. -Continue current regimen. Adjust prescription quantity to 60 tablets per month to match patient's usage.  Pessary-related bleeding Patient reports intermittent bleeding potentially related to pessary use. -Recommend discussing with Gynecology at next appointment in March.  Renal Function Creatinine levels fluctuating, potentially due to hydration status at time of blood draw. -Recommend next blood draw be done in the middle of the day when patient is well-hydrated.  General Health Maintenance -Continue current home exercise regimen. -Continue current diet and hydration practices. -Consider use of pill box to ensure consistent medication  dosing. -Follow-up after next Gynecology appointment to discuss any changes or concerns.     Family will call us regarding physical therapy who they would prefer they we will go ahead with referral In regards to alprazolam she utilizes 1/2 tablet earlier today and a  full tablet in the evening if necessary she does additional half tablet We have tried serotonin reuptake inhibitors been unsuccessful with those  Hyponatremia-resolved  CKD stage IIIb but could be related to poor fluid intake recommend patient repeat metabolic 7 before next visit follow-up in approximately 3 to 4 months  1. Ataxia (Primary) Patient relates poor balance she like to work with physical therapy to readjust her cane so she walks more upright  2. Lumbar pain Hold off on x-rays currently Tylenol as needed  3. Weakness of both lower extremities Physical therapy would be beneficial for the patient Patient has progressive kyphosis as well as low back stiffness as she gets older this is causing her to walk hunched over.  She has weakness in her legs difficulty getting up out of a chair and when she walks she takes narrow steps and cannot pick her feet up well.  She is at significant risk of falling.  It would be reasonable to have home physical therapy evaluate her for some gait training and strength training.  Unlikely we are going to be able to correct her underlying tendency to be bent over CKD 3B-check metabolic 7 before next visit patient aware she will try to be well-hydrated when getting the blood drawn

## 2023-07-12 ENCOUNTER — Encounter: Payer: Self-pay | Admitting: Family Medicine

## 2023-07-12 ENCOUNTER — Ambulatory Visit: Payer: Medicare Other | Admitting: Urology

## 2023-07-12 NOTE — Telephone Encounter (Signed)
Nurses-please go ahead with physical therapy consult specifically for Romeo [facility] physical therapist with [facility] Please indicate that the patient needs home physical therapy because she is homebound  Diagnosis ataxia and leg weakness

## 2023-07-12 NOTE — Addendum Note (Signed)
Addended by: Margaretha Sheffield on: 07/12/2023 04:09 PM   Modules accepted: Orders

## 2023-07-25 ENCOUNTER — Telehealth: Payer: Self-pay

## 2023-07-25 NOTE — Telephone Encounter (Signed)
 Patient's daughter was made aware and voiced understanding. Daughter states she would let mom know.

## 2023-07-25 NOTE — Telephone Encounter (Signed)
-----   Message from Lauraine JAYSON Oz sent at 07/25/2023 11:52 AM EST ----- Please let patient know that Dr. Sherrilee confirmed she can have her first bladder Botox procedure done in the office instead of OR if she elects to proceed with that in the future for her refractory OAB. ----- Message ----- From: Sherrilee Belvie CROME, MD Sent: 07/25/2023   9:48 AM EST To: Lauraine JAYSON Oz, FNP  The office is fine ----- Message ----- From: Oz Lauraine JAYSON, FNP Sent: 07/11/2023   3:46 PM EST To: Belvie CROME Sherrilee, MD  Refractory OAB patient. If she elects to proceed with Botox in the future would you be okay with doing her first Botox procedure in the office instead of OR? She is not a good candidate for anesthesia due to her cardiac & cognitive comorbidities.

## 2023-07-26 DIAGNOSIS — R531 Weakness: Secondary | ICD-10-CM | POA: Diagnosis not present

## 2023-07-26 DIAGNOSIS — Z885 Allergy status to narcotic agent status: Secondary | ICD-10-CM | POA: Diagnosis not present

## 2023-07-26 DIAGNOSIS — Z888 Allergy status to other drugs, medicaments and biological substances status: Secondary | ICD-10-CM | POA: Diagnosis not present

## 2023-07-26 DIAGNOSIS — I10 Essential (primary) hypertension: Secondary | ICD-10-CM | POA: Diagnosis not present

## 2023-07-26 DIAGNOSIS — R079 Chest pain, unspecified: Secondary | ICD-10-CM | POA: Diagnosis not present

## 2023-07-26 DIAGNOSIS — R29898 Other symptoms and signs involving the musculoskeletal system: Secondary | ICD-10-CM | POA: Diagnosis not present

## 2023-07-28 ENCOUNTER — Telehealth: Payer: Self-pay | Admitting: Urology

## 2023-07-28 ENCOUNTER — Telehealth: Payer: Self-pay | Admitting: *Deleted

## 2023-07-28 NOTE — Telephone Encounter (Signed)
 Possible uti, wants to know if she can come in today

## 2023-07-28 NOTE — Telephone Encounter (Signed)
 [facility]) notified of provider's recomendations

## 2023-07-28 NOTE — Telephone Encounter (Signed)
 Copied from CRM [id number]. Topic: Clinical - Medical Advice >> Jul 28, 2023  9:49 AM Antonio DEL wrote: Reason for CRM: Spoke with patient's daughter Verneita, patient was seen at the emergency room 2 days ago because of numbness of left leg. CT scan was negative however patient woke up today and can't move left leg, it feels heavy and it's like her brain can't tell her leg what to do. Verneita said the patient has cognitively taken a turn for the worse in the past 3 days. She would like a call back at [phone] to see what Dr. Alphonsa thinks they should do.

## 2023-07-28 NOTE — Telephone Encounter (Signed)
 Campbell Riches, NP     With the severity of her symptoms and possible underlying cause for her worsening issues based on family report, recommend she go to the ED for urgent work up. Cognitive changes can have many underlying causes. Thanks.

## 2023-07-31 DIAGNOSIS — M4134 Thoracogenic scoliosis, thoracic region: Secondary | ICD-10-CM | POA: Diagnosis not present

## 2023-07-31 DIAGNOSIS — M5416 Radiculopathy, lumbar region: Secondary | ICD-10-CM | POA: Diagnosis not present

## 2023-07-31 DIAGNOSIS — M47816 Spondylosis without myelopathy or radiculopathy, lumbar region: Secondary | ICD-10-CM | POA: Diagnosis not present

## 2023-07-31 DIAGNOSIS — M25551 Pain in right hip: Secondary | ICD-10-CM | POA: Diagnosis not present

## 2023-08-01 ENCOUNTER — Telehealth: Payer: Self-pay | Admitting: *Deleted

## 2023-08-01 DIAGNOSIS — N1832 Chronic kidney disease, stage 3b: Secondary | ICD-10-CM | POA: Diagnosis not present

## 2023-08-01 DIAGNOSIS — Z7952 Long term (current) use of systemic steroids: Secondary | ICD-10-CM | POA: Diagnosis not present

## 2023-08-01 DIAGNOSIS — M419 Scoliosis, unspecified: Secondary | ICD-10-CM | POA: Diagnosis not present

## 2023-08-01 DIAGNOSIS — Z7901 Long term (current) use of anticoagulants: Secondary | ICD-10-CM | POA: Diagnosis not present

## 2023-08-01 DIAGNOSIS — F419 Anxiety disorder, unspecified: Secondary | ICD-10-CM | POA: Diagnosis not present

## 2023-08-01 DIAGNOSIS — Z9181 History of falling: Secondary | ICD-10-CM | POA: Diagnosis not present

## 2023-08-01 NOTE — Telephone Encounter (Signed)
 Copied from CRM [id number]. Topic: Clinical - Home Health Verbal Orders >> Aug 01, 2023  1:29 PM Kinnie DEL wrote: Caller/Agency: [facility] Callback Number: [phone] Service Requested: Physical Therapy Frequency: 2 week 3 1 week 4 Any new concerns about the patient? No

## 2023-08-01 NOTE — Telephone Encounter (Signed)
 May have verbal order for physical therapy as requested thank you

## 2023-08-02 NOTE — Telephone Encounter (Signed)
 Called Ben from [facility] and gave verbal for physical therapy per Dr Gerda Diss

## 2023-08-07 DIAGNOSIS — Z9181 History of falling: Secondary | ICD-10-CM | POA: Diagnosis not present

## 2023-08-07 DIAGNOSIS — N1832 Chronic kidney disease, stage 3b: Secondary | ICD-10-CM | POA: Diagnosis not present

## 2023-08-07 DIAGNOSIS — F419 Anxiety disorder, unspecified: Secondary | ICD-10-CM | POA: Diagnosis not present

## 2023-08-07 DIAGNOSIS — Z7901 Long term (current) use of anticoagulants: Secondary | ICD-10-CM | POA: Diagnosis not present

## 2023-08-07 DIAGNOSIS — M419 Scoliosis, unspecified: Secondary | ICD-10-CM | POA: Diagnosis not present

## 2023-08-07 DIAGNOSIS — Z7952 Long term (current) use of systemic steroids: Secondary | ICD-10-CM | POA: Diagnosis not present

## 2023-08-09 DIAGNOSIS — L110 Acquired keratosis follicularis: Secondary | ICD-10-CM | POA: Diagnosis not present

## 2023-08-09 DIAGNOSIS — M79671 Pain in right foot: Secondary | ICD-10-CM | POA: Diagnosis not present

## 2023-08-09 DIAGNOSIS — M79674 Pain in right toe(s): Secondary | ICD-10-CM | POA: Diagnosis not present

## 2023-08-09 DIAGNOSIS — M79675 Pain in left toe(s): Secondary | ICD-10-CM | POA: Diagnosis not present

## 2023-08-09 DIAGNOSIS — I739 Peripheral vascular disease, unspecified: Secondary | ICD-10-CM | POA: Diagnosis not present

## 2023-08-09 DIAGNOSIS — M79672 Pain in left foot: Secondary | ICD-10-CM | POA: Diagnosis not present

## 2023-08-10 DIAGNOSIS — Z9181 History of falling: Secondary | ICD-10-CM | POA: Diagnosis not present

## 2023-08-10 DIAGNOSIS — F419 Anxiety disorder, unspecified: Secondary | ICD-10-CM | POA: Diagnosis not present

## 2023-08-10 DIAGNOSIS — Z7952 Long term (current) use of systemic steroids: Secondary | ICD-10-CM | POA: Diagnosis not present

## 2023-08-10 DIAGNOSIS — M419 Scoliosis, unspecified: Secondary | ICD-10-CM | POA: Diagnosis not present

## 2023-08-10 DIAGNOSIS — N1832 Chronic kidney disease, stage 3b: Secondary | ICD-10-CM | POA: Diagnosis not present

## 2023-08-10 DIAGNOSIS — Z7901 Long term (current) use of anticoagulants: Secondary | ICD-10-CM | POA: Diagnosis not present

## 2023-08-11 ENCOUNTER — Other Ambulatory Visit: Payer: Self-pay | Admitting: Family Medicine

## 2023-08-14 DIAGNOSIS — Z7952 Long term (current) use of systemic steroids: Secondary | ICD-10-CM | POA: Diagnosis not present

## 2023-08-14 DIAGNOSIS — Z7901 Long term (current) use of anticoagulants: Secondary | ICD-10-CM | POA: Diagnosis not present

## 2023-08-14 DIAGNOSIS — Z9181 History of falling: Secondary | ICD-10-CM | POA: Diagnosis not present

## 2023-08-14 DIAGNOSIS — F419 Anxiety disorder, unspecified: Secondary | ICD-10-CM | POA: Diagnosis not present

## 2023-08-14 DIAGNOSIS — N1832 Chronic kidney disease, stage 3b: Secondary | ICD-10-CM | POA: Diagnosis not present

## 2023-08-14 DIAGNOSIS — M419 Scoliosis, unspecified: Secondary | ICD-10-CM | POA: Diagnosis not present

## 2023-08-17 DIAGNOSIS — N1832 Chronic kidney disease, stage 3b: Secondary | ICD-10-CM | POA: Diagnosis not present

## 2023-08-17 DIAGNOSIS — M419 Scoliosis, unspecified: Secondary | ICD-10-CM | POA: Diagnosis not present

## 2023-08-17 DIAGNOSIS — Z9181 History of falling: Secondary | ICD-10-CM | POA: Diagnosis not present

## 2023-08-17 DIAGNOSIS — Z7952 Long term (current) use of systemic steroids: Secondary | ICD-10-CM | POA: Diagnosis not present

## 2023-08-17 DIAGNOSIS — F419 Anxiety disorder, unspecified: Secondary | ICD-10-CM | POA: Diagnosis not present

## 2023-08-17 DIAGNOSIS — Z7901 Long term (current) use of anticoagulants: Secondary | ICD-10-CM | POA: Diagnosis not present

## 2023-08-22 DIAGNOSIS — Z7952 Long term (current) use of systemic steroids: Secondary | ICD-10-CM | POA: Diagnosis not present

## 2023-08-22 DIAGNOSIS — N1832 Chronic kidney disease, stage 3b: Secondary | ICD-10-CM | POA: Diagnosis not present

## 2023-08-22 DIAGNOSIS — F419 Anxiety disorder, unspecified: Secondary | ICD-10-CM | POA: Diagnosis not present

## 2023-08-22 DIAGNOSIS — Z7901 Long term (current) use of anticoagulants: Secondary | ICD-10-CM | POA: Diagnosis not present

## 2023-08-22 DIAGNOSIS — M419 Scoliosis, unspecified: Secondary | ICD-10-CM | POA: Diagnosis not present

## 2023-08-22 DIAGNOSIS — Z9181 History of falling: Secondary | ICD-10-CM | POA: Diagnosis not present

## 2023-08-28 DIAGNOSIS — Z7901 Long term (current) use of anticoagulants: Secondary | ICD-10-CM | POA: Diagnosis not present

## 2023-08-28 DIAGNOSIS — Z9181 History of falling: Secondary | ICD-10-CM | POA: Diagnosis not present

## 2023-08-28 DIAGNOSIS — Z7952 Long term (current) use of systemic steroids: Secondary | ICD-10-CM | POA: Diagnosis not present

## 2023-08-28 DIAGNOSIS — M419 Scoliosis, unspecified: Secondary | ICD-10-CM | POA: Diagnosis not present

## 2023-08-28 DIAGNOSIS — F419 Anxiety disorder, unspecified: Secondary | ICD-10-CM | POA: Diagnosis not present

## 2023-08-28 DIAGNOSIS — N1832 Chronic kidney disease, stage 3b: Secondary | ICD-10-CM | POA: Diagnosis not present

## 2023-08-31 DIAGNOSIS — Z9181 History of falling: Secondary | ICD-10-CM | POA: Diagnosis not present

## 2023-08-31 DIAGNOSIS — M419 Scoliosis, unspecified: Secondary | ICD-10-CM | POA: Diagnosis not present

## 2023-08-31 DIAGNOSIS — N1832 Chronic kidney disease, stage 3b: Secondary | ICD-10-CM | POA: Diagnosis not present

## 2023-08-31 DIAGNOSIS — Z7901 Long term (current) use of anticoagulants: Secondary | ICD-10-CM | POA: Diagnosis not present

## 2023-08-31 DIAGNOSIS — F419 Anxiety disorder, unspecified: Secondary | ICD-10-CM | POA: Diagnosis not present

## 2023-08-31 DIAGNOSIS — Z7952 Long term (current) use of systemic steroids: Secondary | ICD-10-CM | POA: Diagnosis not present

## 2023-09-05 DIAGNOSIS — Z7952 Long term (current) use of systemic steroids: Secondary | ICD-10-CM | POA: Diagnosis not present

## 2023-09-05 DIAGNOSIS — Z7901 Long term (current) use of anticoagulants: Secondary | ICD-10-CM | POA: Diagnosis not present

## 2023-09-05 DIAGNOSIS — N1832 Chronic kidney disease, stage 3b: Secondary | ICD-10-CM | POA: Diagnosis not present

## 2023-09-05 DIAGNOSIS — M419 Scoliosis, unspecified: Secondary | ICD-10-CM | POA: Diagnosis not present

## 2023-09-05 DIAGNOSIS — Z9181 History of falling: Secondary | ICD-10-CM | POA: Diagnosis not present

## 2023-09-05 DIAGNOSIS — F419 Anxiety disorder, unspecified: Secondary | ICD-10-CM | POA: Diagnosis not present

## 2023-09-07 ENCOUNTER — Other Ambulatory Visit: Payer: Self-pay | Admitting: Family Medicine

## 2023-09-08 ENCOUNTER — Other Ambulatory Visit: Payer: Self-pay | Admitting: Family Medicine

## 2023-09-11 DIAGNOSIS — M419 Scoliosis, unspecified: Secondary | ICD-10-CM | POA: Diagnosis not present

## 2023-09-11 DIAGNOSIS — F419 Anxiety disorder, unspecified: Secondary | ICD-10-CM | POA: Diagnosis not present

## 2023-09-11 DIAGNOSIS — Z9181 History of falling: Secondary | ICD-10-CM | POA: Diagnosis not present

## 2023-09-11 DIAGNOSIS — Z7901 Long term (current) use of anticoagulants: Secondary | ICD-10-CM | POA: Diagnosis not present

## 2023-09-11 DIAGNOSIS — N1832 Chronic kidney disease, stage 3b: Secondary | ICD-10-CM | POA: Diagnosis not present

## 2023-09-11 DIAGNOSIS — Z7952 Long term (current) use of systemic steroids: Secondary | ICD-10-CM | POA: Diagnosis not present

## 2023-09-21 DIAGNOSIS — Z4689 Encounter for fitting and adjustment of other specified devices: Secondary | ICD-10-CM | POA: Diagnosis not present

## 2023-09-21 DIAGNOSIS — N8110 Cystocele, unspecified: Secondary | ICD-10-CM | POA: Diagnosis not present

## 2023-09-21 DIAGNOSIS — N390 Urinary tract infection, site not specified: Secondary | ICD-10-CM | POA: Diagnosis not present

## 2023-09-21 DIAGNOSIS — L900 Lichen sclerosus et atrophicus: Secondary | ICD-10-CM | POA: Diagnosis not present

## 2023-09-21 DIAGNOSIS — N952 Postmenopausal atrophic vaginitis: Secondary | ICD-10-CM | POA: Diagnosis not present

## 2023-09-28 ENCOUNTER — Encounter: Payer: Self-pay | Admitting: Family Medicine

## 2023-09-29 ENCOUNTER — Other Ambulatory Visit: Payer: Self-pay | Admitting: Family Medicine

## 2023-09-29 NOTE — Telephone Encounter (Signed)
See message in patients chart

## 2023-10-10 ENCOUNTER — Other Ambulatory Visit: Payer: Self-pay | Admitting: Family Medicine

## 2023-10-11 ENCOUNTER — Other Ambulatory Visit: Payer: Self-pay | Admitting: Family Medicine

## 2023-10-18 DIAGNOSIS — L110 Acquired keratosis follicularis: Secondary | ICD-10-CM | POA: Diagnosis not present

## 2023-10-18 DIAGNOSIS — M79672 Pain in left foot: Secondary | ICD-10-CM | POA: Diagnosis not present

## 2023-10-18 DIAGNOSIS — M79674 Pain in right toe(s): Secondary | ICD-10-CM | POA: Diagnosis not present

## 2023-10-18 DIAGNOSIS — I739 Peripheral vascular disease, unspecified: Secondary | ICD-10-CM | POA: Diagnosis not present

## 2023-10-18 DIAGNOSIS — M79671 Pain in right foot: Secondary | ICD-10-CM | POA: Diagnosis not present

## 2023-10-18 DIAGNOSIS — M79675 Pain in left toe(s): Secondary | ICD-10-CM | POA: Diagnosis not present

## 2023-11-07 ENCOUNTER — Other Ambulatory Visit: Payer: Self-pay | Admitting: Family Medicine

## 2023-11-08 ENCOUNTER — Encounter: Payer: Self-pay | Admitting: Family Medicine

## 2023-11-09 ENCOUNTER — Encounter: Payer: Self-pay | Admitting: Family Medicine

## 2023-11-09 ENCOUNTER — Ambulatory Visit (INDEPENDENT_AMBULATORY_CARE_PROVIDER_SITE_OTHER): Payer: Medicare Other | Admitting: Family Medicine

## 2023-11-09 VITALS — BP 139/74 | Wt 127.0 lb

## 2023-11-09 DIAGNOSIS — R079 Chest pain, unspecified: Secondary | ICD-10-CM | POA: Diagnosis not present

## 2023-11-09 DIAGNOSIS — R300 Dysuria: Secondary | ICD-10-CM | POA: Diagnosis not present

## 2023-11-09 DIAGNOSIS — N1832 Chronic kidney disease, stage 3b: Secondary | ICD-10-CM

## 2023-11-09 DIAGNOSIS — M1711 Unilateral primary osteoarthritis, right knee: Secondary | ICD-10-CM

## 2023-11-09 NOTE — Progress Notes (Signed)
 Subjective:    Patient ID: Becky Baird, female    DOB: 1934/02/23, 89 y.o.   MRN: [id number]  Discussed the use of AI scribe software for clinical note transcription with the patient, who gave verbal consent to proceed.  History of Present Illness   Becky Baird is an 89 year old female who presents with leg pain and difficulty walking.  She experiences significant leg pain following a physical therapy session, where she did not release her leg as instructed, resulting in severe pain the next morning, making it difficult to walk. The pain is located in the muscle and is described as slow to improve, though it is gradually getting better. She notes a 'quick place' on her knee that causes discomfort. She uses Tylenol Extra Strength, taking up to three pills a day, to manage the pain. She has not used any topical treatments for pain relief but has tried Cetaphil for dry skin, which she feels has helped with the dryness. She wakes up at night with burning sensations in her legs.  She describes difficulty walking due to her feet feeling 'slippery' and uses shoes at home to prevent slipping. Her house is small with many corners, making it difficult to maneuver with a walker.  She experiences pain in her lower abdomen when urinating, which she describes as severe enough to 'almost take my breath.' This pain occurs at the start of urination and stops when she finishes. She is seeing a urology specialist for this issue.  She recalls an episode of chest pain on the left side about three to four weeks ago, which occurred at night and resolved without intervention. She did not seek immediate help but has a life alert system in place for emergencies.  She eats fairly well, with family members, including Vicky, bringing her meals most days. She often eats peanut butter sandwiches, cereal with fruit, and drinks Ensure daily. She has been eating kiwis with her cereal, which she feels has helped with her  bowel movements.         Review of Systems     Objective:    Physical Exam     General-in no acute distress Eyes-no discharge Lungs-respiratory rate normal, CTA CV-no murmurs,RRR Extremities skin warm dry  Neuro grossly normal Behavior normal, alert She does have some ankle edema but not severe She also has some angulation issues in the right knee because of osteoarthritis with a prominent right fibula head that is tender          Assessment & Plan:  Assessment and Plan   Given the knee osteoarthritis if she does not respond to the Tylenol then I would recommend orthopedic consultation I do not feel surgery would be a good idea given her age and underlying health issues Voltaren gel would be too complicated.  As for the chest discomfort the left-sided hard to tell if that was truly coronary artery disease I believe it is more musculoskeletal.  If she starts developing chest tightness discomfort with activity or frequent episodes of chest discomfort we will need to get cardiology consultation EKG does not show any acute ST segment changes shows occasional PVC  Urinary symptoms she sees urology tomorrow she has regular follow-ups with them her abdomen is soft on today's exam I would not recommend a CT scan currently  Weight loss-she has had gradual weight loss over the past 6 months I think it is related to that she does not eat as much as she used to  plus also her age and she is just starting to have some sarcopenia.  Obviously if she starts having dramatic weight reduction scans can be done but currently I do not feel that scans are necessary to regards to any rectal bleeding any type of fever sweats chills vomiting  Chronic kidney disease stable will monitor this every 4 to 6 months on a regular basis  Follow-up within 6 months

## 2023-11-10 ENCOUNTER — Ambulatory Visit: Admitting: Urology

## 2023-11-10 ENCOUNTER — Encounter: Payer: Self-pay | Admitting: Urology

## 2023-11-10 VITALS — BP 118/80 | HR 61

## 2023-11-10 DIAGNOSIS — N390 Urinary tract infection, site not specified: Secondary | ICD-10-CM | POA: Diagnosis not present

## 2023-11-10 DIAGNOSIS — N3281 Overactive bladder: Secondary | ICD-10-CM | POA: Diagnosis not present

## 2023-11-10 DIAGNOSIS — N952 Postmenopausal atrophic vaginitis: Secondary | ICD-10-CM

## 2023-11-10 DIAGNOSIS — N819 Female genital prolapse, unspecified: Secondary | ICD-10-CM

## 2023-11-10 DIAGNOSIS — N904 Leukoplakia of vulva: Secondary | ICD-10-CM | POA: Diagnosis not present

## 2023-11-10 DIAGNOSIS — N3941 Urge incontinence: Secondary | ICD-10-CM

## 2023-11-10 DIAGNOSIS — R8290 Unspecified abnormal findings in urine: Secondary | ICD-10-CM | POA: Diagnosis not present

## 2023-11-10 DIAGNOSIS — Z87440 Personal history of urinary (tract) infections: Secondary | ICD-10-CM | POA: Diagnosis not present

## 2023-11-10 DIAGNOSIS — R399 Unspecified symptoms and signs involving the genitourinary system: Secondary | ICD-10-CM

## 2023-11-10 LAB — URINALYSIS, ROUTINE W REFLEX MICROSCOPIC
Bilirubin, UA: NEGATIVE
Glucose, UA: NEGATIVE
Ketones, UA: NEGATIVE
Nitrite, UA: NEGATIVE
Specific Gravity, UA: 1.02 (ref 1.005–1.030)
Urobilinogen, Ur: 1 mg/dL (ref 0.2–1.0)
pH, UA: 6.5 (ref 5.0–7.5)

## 2023-11-10 LAB — MICROSCOPIC EXAMINATION
RBC, Urine: >30 /HPF — AB (ref 0–2)
WBC, UA: >30 /HPF — AB (ref 0–5)

## 2023-11-10 LAB — BLADDER SCAN AMB NON-IMAGING: Scan Result: 0

## 2023-11-10 MED ORDER — CEPHALEXIN 250 MG PO CAPS: 250.0000 mg | ORAL_CAPSULE | Freq: Every day | ORAL | 5 refills | Status: DC

## 2023-11-10 MED ORDER — SULFAMETHOXAZOLE-TRIMETHOPRIM 400-80 MG PO TABS: 1.0000 | ORAL_TABLET | Freq: Two times a day (BID) | ORAL | 0 refills | Status: DC

## 2023-11-10 NOTE — Progress Notes (Signed)
 Bladder Scan completed today.  Patient can void prior to the bladder scan. Bladder scan result: 0  Performed By: Gwendolyn Grant T. CMA  Additional notes-

## 2023-11-10 NOTE — Patient Instructions (Signed)
 For refractory OAB we reviewed the procedure for intravesical Botox injection with cystoscopy in the office and reviewed the risks, benefits and alternatives of treatment including but not limited to infection, need for self catheterization and need for repeat therapy. We discussed that there is a 5-12% chance of needing to catheterize with Botox and that this usually resolves in a few months; however can persist for longer periods of time. Typically Botox injections would need to be repeated every 3-12 months since this is not a permanent therapy.       UTI prevention / management:  Difference between Urinalysis (urine dipstick test) and Urine culture:  Urinalysis (urine dipstick test): A quick office test used as an indicator to determine whether or not further testing is necessary (such as a urine culture, urine microscopy, etc.) The urinalysis cannot differentiate a true bacterial UTI or give a definitive diagnosis for the findings.  Urine culture: May be performed based on the findings of a urinalysis to evaluate for UTI. Grows out on a petri dish for 48-72 hours. Provides important information about: whether or not bacterial growth is present and if so: what the predominant bacteria is which antibiotics will work best against that bacteria That information is important so that we can diagnose and treat patients appropriately as there are other conditions which may mimic UTls which must not be missed (such as cancer, interstitial cystitis, stones, etc.). Assists us  with antibiotic stewardship to minimize patient's risk for developing antibiotic resistance (getting to a point where no antibiotics work anymore).  Options when UTI symptoms occur: 1. Call [facility] [place] and request to speak with triage nurse (phone # [phone], select option 3). In accordance with clinic guidelines the nurse will determine next steps based on patient-reported symptoms, which may include:  same-day lab visit to provide urine specimen, recommendation to schedule Urology office visit appointment for further evaluation, recommendation to proceed to ER, etc. 2. Call your Primary Care Provider (PCP) office to request urgent / same-day visit. Be sure to request for urine culture to be ordered and have results faxed to Urology (fax # [phone]).  3. Go to urgent care. Be sure to request for urine culture to be ordered and have results faxed to Urology (fax # [phone]).   For bladder pain/ burning with urination: - Can take over-the-counter Pyridium (phenazopyridine; commonly known under the "AZO" brand) for a few days as needed. Limit use to no more than 3 days consecutively due to risk for methemoglobinemia, liver function issues, and bone health damage with long term use of Pyridium. - Alternative: Prescription urinary analgesics (such as Uribel, Urogesic blue, Urelle, Uro-MP). Often expensive / poorly covered by insurance unfortunately.  Options / recommendations for UTI prevention: - Low dose antibiotic daily for UTI prophylaxis. - Topical vaginal estrogen for vaginal atrophy (aka Genitourinary Syndrome of Menopause (GSM)). - Adequate daily fluid intake to flush out the urinary tract. - Go to the bathroom to urinate every 4-6 hours while awake to minimize urinary stasis / bacterial overgrowth in the bladder. - Proanthocyanidin (PAC) supplement 36 mg daily; must be soluble (insoluble form of PAC will be ineffective). Recommended brand: Ellura. This is an over-the-counter supplement (often must be found/ purchased online) supplement derived from cranberries with concentrated active component: Proanthocyanidin (PAC) 36 mg daily. Decreases bacterial adherence to bladder lining.  - D-mannose powder (2 grams daily). This is an over-the-counter supplement which decreases bacterial adherence to bladder lining (it is a sugar that inhibits bacterial adherence  to urothelial cells by binding to  the pili of enteric bacteria). Take as per manufacturer recommendation. Can be used as an alternative or in addition to the concentrated cranberry supplement.  - Vitamin C supplement to acidify urine to minimize bacterial growth.  - Probiotic to maintain healthy vaginal microbiome to suppress bacteria at urethral opening. Brand recommendations: Estill Hemming (includes probiotic & D-mannose ), Feminine Balance (highest concentration of lactobacillus) or Hyperbiotic Pro 15.  Note for patients with diabetes:  - Be aware that D-mannose contains sugar.  Note for patients with interstitial cystitis (IC):  - Patients with IC should typically avoid cranberry/ PAC supplements and Vitamin C supplements due to their acidity, which may exacerbate IC-related bladder pain. - Symptoms of true bacterial UTI can overlap / mimic symptoms of an IC flare up. Antibiotic use is NOT indicated for IC flare ups. Urine culture needed prior to antibiotic treatment for IC patients. The goal is to minimize your risk for developing antibiotic-resistant bacteria.    Vaginal atrophy I Genitourinary syndrome of menopause (GSM):  What it is: Changes in the vaginal environment (including the vulva and urethra) including: Thinning of the epithelium (skin/ mucosa surface) Can contribute to urinary urgency and frequency Can contribute to dryness, itching, irritation of the vulvar and vaginal tissue Can contribute to pain with intercourse Can contribute to physical changes of the labia, vulva, and vagina such as: Narrowing of the vaginal opening Decreased vaginal length Loss of labial architecture Labial adhesions Pale color of vulvovaginal tissue Loss of pubic hair Allows bacteria to become adherent  Results in increased risk for urinary tract infection (UTI) due to bacterial overgrowth and migration up the urethra into the bladder Change in vaginal pH (acid/ base balance) Allows for alteration / disruption of the normal bacterial  flora / microbiome Results in increased risk for urinary tract infection (UTI) due to bacterial overgrowth  Treatment options: Over-the-counter lubricants (see list below). Prescription vaginal estrogen replacement. Options: Topical vaginal estrogen cream Estrace, Premarin, or compounded estradiol cream/ gel We advise: Discard plastic applicator as that tends to use more medication than you need, which is not harmful but wastes / uses up the medication. Also the plastic applicator may cause discomfort. Insert blueberry size amount of medication via the tip of your finger inside vagina nightly for 1 week then 2-3 times per week (long term). Estring vaginal ring Exchanged every 3 months (either at home or in office by provider) Vagifem vaginal tablet Inserted nightly for 2 weeks then twice a week (long term) lntrarosa vaginal suppository Vaginal DHEA: converts to estrogen in vaginal tissue without systemic effect Inserted nightly (long term) Vaginal laser therapy (Mona Lisa touch) Performed in 3 treatments each 6 weeks apart (available in our [place] office). Can feel like a sunburn for 3-4 days after each treatment until new skin heals in. Usually not covered by insurance. Estimated cost is $1500 for all 3 sessions.  FYI regarding prescription vaginal estrogen treatment options: All topical vaginal estrogen replacement options are equivalent in terms of efficacy. Topical vaginal estrogen replacement will take about 3 months to be effective. OK to have sex with any of the topical vaginal estrogen replacement options. Topical vaginal estrogen replacement may sting/burn initially due to severe dryness, which will improve with ongoing treatment. There have been studies that evaluate use of low-dose intravaginal estrogen that show minimal systemic absorption which is negligible after 3 weeks. There have been no studies indicating increased risk of contributing to cancer development or  recurrence.  Topical vaginal estrogen cream safe to use with breast cancer history [email or web address]  Topical vaginal estrogen cream safe to use with blood clot history [email or web address]   Lubricants and Moisturizers for Treating Genitourinary Syndrome of Menopause and Vulvovaginal Atrophy Treatment Comments I Available Products   Lubricants   Water -based Ingredients: Deionized water , glycerin, propylene glycol; latex safe; rare irritation; dry out with extended sexual activity Astroglide, Good Clean Love, K-Y Jelly, Natural, Organic, Pink, Sliquid, Sylk, Yes    Oil Based Ingredients: avocado, olive, peanut, corn; latex safe; can be used with silicone products; staining; safe (unless peanut allergy); non-irritating Coconut oil, vegetable oil, vitamin E oil  Silicone-Based Ingredients: Silicone polymers; staining; typically nonirritating, long lasting; waterproof; should not be used with silicone dilators, sexual toys, or gynecologic products Astroglide X, Oceanus Ultra Pure, Pink Silicone, Pjur Eros, Replens Silky Smooth, Silicone Premium JO, SKYN, Uberlube, [employer] Based Minimize harm to sperm motility; designed Astroglide TTC, Conceive Plus, Pre for couples trying to conceive Seed, Yes Baby  Fertility Friendly Minimize harm to sperm motility; designed Astroglide, TTC, Conceive Plus, Pre for couples trying to conceive Seed, Yes Baby  Vaginal Moisturizers   Vaginal Moisturizers For maintenance use 1 to 3 times weekly; can benefit women with dryness, chafing with AOL, and recurrent vaginal infections irrespective of sexual activity timing Balance Active Menopause Vaginal Moisturizing Lubricant, Canesintima Intimate Moisturizer,  Replens, Rephresh, Sylk Natural Intimate Moisturizer, Yes Vaginal Moisturizer  Hybrids Properties of both water  and silicone-based products (combination of a vaginal lubricant and moisturizer); Non-irritating; good option for women with allergies and sensitivities Lubrigyn, Luvena  Suppositories Hyaluronic acid to retain moisture Revaree  Vulvar Soothing Creams/Oils    Medicated CreamsP ain and burn relief; Ingredients: 4% Lidocaine , Aloe Vera gel Releveum ([place])  Non-Medicated Creams For anti-itch and moisture/maintenance; Ingredients: Coconut oil, Avocado oil, Shea Butter, Olive oil, Vitamin E Vajuvenate, Vmagic  Oils !For moisture/maintenance !Coconut oil, Vitamin E oil, Emu oil

## 2023-11-10 NOTE — Progress Notes (Signed)
 Name: Becky Baird DOB: 1934-02-27 MRN: [id number]  History of Present Illness: Ms. Rallis is a 89 y.o. female who presents today for follow up visit at [facility] [place]. She is accompanied by daughter Glenora Laos, who assists with providing history due to patient's cognitive impairment. - GU history: 1. Recurrent UTI. - Prior episode of sepsis in September 2019. - Requires I&O cath urine specimen any time UTI is suspected. On 05/16/2023 her voided urine sample appeared infected however I&O catheterized urine sample was normal. Based on this finding it is highly likely that her voided urine specimens are unreliable for evaluation of UTI due to vaginal contamination.  2. OAB with urinary frequency, nocturia, urgency, and urge incontinence. - Failed behavioral therapy. - Not a safe candidate for anticholinergic medications due to history of stroke & TIA with memory loss and mild cognitive impairment, age, and risk for side effects.  - Allergic to Gemtesa ; not safe to trial Myrbetriq (also a beta-3 agonist medication). - Not a viable candidate for PTNS due to peripheral neuropathy. - Not a viable candidate for sacral neuromodulation. 3. Stress urinary incontinence. Improved with pessary. Not significantly bothersome per patient. 4. Pelvic organ prolapse (cystocele). - Has a #5 incontinence ring pessary which is managed by her GYN provider. 5. Vaginal atrophy. Uses topical vaginal estrogen cream daily as prescribed by her gynecologist. 6. Lichen sclerosus. Uses Clobetasol as prescribed by her gynecologist.   Urine culture results in past 12 months: - 11/11/2022: Negative - 11/21/2022: Positive for Pseudomonas aeruginosa - 01/05/2023: Positive for Enterobacter cloacae complex - 02/07/2023: Positive for E. Coli; asymptomatic (no antibiotic prescribed) - 04/12/2023: Positive for Proteus mirabilis; asymptomatic (no antibiotic prescribed) - 05/03/2023: Positive for Proteus  mirabilis; asymptomatic (treated with Cipro  by GYN provider)  At last visit on 07/11/2023: - Reported ongoing urinary frequency, nocturia, urgency, and urge incontinence.  - Consulted with Dr. Claretta Croft, who confirmed she can have her first bladder Botox procedure done in the office if she elects to proceed with that for her refractory OAB (since she is not a good candidate for anesthesia due to her cardiac & cognitive comorbidities). - The plan was:  1. Continue to minimize caffeine intake. 2. Timed voiding. 3. Continue topical vaginal estrogen cream use. 4. Return in about 6 months (around 01/09/2024) for f/u with Griselda Lederer, NP.  Today: She reports severe bladder pain and dysuria today with concern for acute UTI. Also reports ongoing urinary frequency, nocturia, urgency, and urge incontinence.    Medications: Current Outpatient Medications  Medication Sig Dispense Refill   ALPRAZolam  (XANAX ) 0.25 MG tablet 1 three times daily as needed 90 tablet 2   cephALEXin  (KEFLEX ) 250 MG capsule Take 1 capsule (250 mg total) by mouth daily. For long term use to prevent recurrent UTIs. 30 capsule 5   clobetasol ointment (TEMOVATE) 0.05 % Apply 1 application topically 2 (two) times daily as needed.     cyanocobalamin (VITAMIN B12) 1000 MCG tablet Take 1 tablet (1,000 mcg total) by mouth daily. 30 tablet 0   estradiol (ESTRACE) 0.1 MG/GM vaginal cream Place 1 Applicatorful vaginally once a week.     hydrochlorothiazide  (HYDRODIURIL ) 25 MG tablet take 1 tablet by mouth once daily stop amlodipine  90 tablet 0   Ketotifen Fumarate (ALAWAY OP) Apply 1 drop to eye daily as needed.     loratadine  (CLARITIN ) 10 MG tablet Take 1 tablet (10 mg total) by mouth daily. (Patient taking differently: Take 10 mg by mouth at bedtime.) 30 tablet  5   OVER THE COUNTER MEDICATION Take 1 capsule by mouth every morning. SUPREMA DOPHILUS     OVER THE COUNTER MEDICATION Take by mouth daily. Vitamin D3 2,000 units daily      pantoprazole  (PROTONIX ) 40 MG tablet take 1 tablet by mouth daily 30 tablet 0   potassium chloride  (KLOR-CON  M) 10 MEQ tablet TAKE ONE TABLET BY MOUTH TWICE DAILY 60 tablet 2   potassium chloride  (KLOR-CON ) 10 MEQ tablet take 1 tablet by mouth twice daily 60 tablet 0   sodium chloride  (OCEAN) 0.65 % nasal spray Place 1 spray into the nose as needed for congestion.      sulfamethoxazole -trimethoprim  (BACTRIM ) 400-80 MG tablet Take 1 tablet by mouth 2 (two) times daily. 7 tablet 0   XARELTO  15 MG TABS tablet take 1 tablet by mouth daily with supper 90 tablet 0   No current facility-administered medications for this visit.    Allergies: Allergies  Allergen Reactions   Amlodipine      Severe pedal edema with 2.5 mg use   Gemtesa  [Vibegron ] Itching    "allover body itching and burning"   Statins Other (See Comments)    Myalgias with rosuvastatin   Neurontin  [Gabapentin ]     Cognitive issues, creates fogginess   Niaspan [Niacin Er (Antihyperlipidemic)] Rash   Oxybutynin     Nausea sweats    Past Medical History:  Diagnosis Date   Anxiety    Chronic back pain    Hyperlipidemia    Hypertension    Memory loss    Mild cognitive impairment, so stated    Occlusion and stenosis of carotid artery with cerebral infarction    Bilateral less than 50% carotid stenosis.   Persistent atrial fibrillation (HCC) 06/23/2016   Renal cyst    Sciatica    Stroke (HCC)    2010, RMCA with stent   Toe fracture 11/2015   R toe   Ulcer 1970s   Past Surgical History:  Procedure Laterality Date   BIOPSY  04/25/2018   Procedure: BIOPSY;  Surgeon: Ruby Corporal, MD;  [facility]: [facility];  Service: Endoscopy;;  gastric polyps   COLONOSCOPY  03/10/2011   Procedure: COLONOSCOPY;  Surgeon: Ruby Corporal, MD;  [facility]: [facility];  Service: Endoscopy;  Laterality: N/A;   COLONOSCOPY N/A 08/24/2016   Procedure: COLONOSCOPY;  Surgeon: Ruby Corporal, MD;  [facility]: [facility];  Service:  Endoscopy;  Laterality: N/A;  930   DILATION AND CURETTAGE OF UTERUS     years ago   ESOPHAGOGASTRODUODENOSCOPY N/A 04/25/2018   Procedure: ESOPHAGOGASTRODUODENOSCOPY (EGD);  Surgeon: Ruby Corporal, MD;  Location: [facility];  Service: Endoscopy;  Laterality: N/A;  1:15   OVARIAN CYST REMOVAL     at least 10 years ago   POLYPECTOMY  08/24/2016   Procedure: POLYPECTOMY;  Surgeon: Ruby Corporal, MD;  Location: [facility];  Service: Endoscopy;;  ascending colon; hepatic flexure   TONSILLECTOMY     childhood   Family History  Problem Relation Age of Onset   Stroke Mother    Heart failure Father    Diabetes Brother    Social History   Socioeconomic History   Marital status: Widowed    Spouse name: Not on file   Number of children: 2   Years of education: College   Highest education level: 12th grade  Occupational History    Employer: RETIRED  Tobacco Use   Smoking status: Never   Smokeless tobacco:  Never  Vaping Use   Vaping status: Never Used  Substance and Sexual Activity   Alcohol  use: No    Alcohol /week: 0.0 standard drinks of alcohol    Drug use: No   Sexual activity: Not Currently  Other Topics Concern   Not on file  Social History Narrative   Patient lives at home alone.    2 "wonderful" daughters per pt.    3 grandchildren.   Caffeine Use: very little, rarely   Social Drivers of Corporate investment banker Strain: Low Risk  (06/08/2021)   Overall Financial Resource Strain (CARDIA)    Difficulty of Paying Living Expenses: Not hard at all  Food Insecurity: No Food Insecurity (06/08/2021)   Hunger Vital Sign    Worried About Running Out of Food in the Last Year: Never true    Ran Out of Food in the Last Year: Never true  Transportation Needs: No Transportation Needs (06/08/2021)   PRAPARE - Administrator, Civil Service (Medical): No    Lack of Transportation (Non-Medical): No  Physical Activity: Insufficiently Active (06/08/2021)    Exercise Vital Sign    Days of Exercise per Week: 5 days    Minutes of Exercise per Session: 20 min  Stress: No Stress Concern Present (06/08/2021)   [employer] of Occupational Health - Occupational Stress Questionnaire    Feeling of Stress : Not at all  Social Connections: Socially Isolated (06/08/2021)   Social Connection and Isolation Panel [NHANES]    Frequency of Communication with Friends and Family: More than three times a week    Frequency of Social Gatherings with Friends and Family: More than three times a week    Attends Religious Services: Never    Database administrator or Organizations: No    Attends Banker Meetings: Never    Marital Status: Widowed  Intimate Partner Violence: Not At Risk (06/08/2021)   Humiliation, Afraid, Rape, and Kick questionnaire    Fear of Current or Ex-Partner: No    Emotionally Abused: No    Physically Abused: No    Sexually Abused: No    Review of Systems Constitutional: Patient denies any unintentional weight loss or change in strength lntegumentary: Patient denies any rashes or pruritus Cardiovascular: Patient denies chest pain or syncope Respiratory: Patient denies shortness of breath Gastrointestinal: Denies nausea Musculoskeletal: Patient denies muscle cramps or weakness Neurologic: Patient denies convulsions or seizures Allergic/Immunologic: Patient denies recent allergic reaction(s) Hematologic/Lymphatic: Patient denies bleeding tendencies Endocrine: Patient denies heat/cold intolerance  GU: As per HPI.  OBJECTIVE Vitals:   11/10/23 1333  BP: 118/80  Pulse: 61   There is no height or weight on file to calculate BMI.  Physical Examination Constitutional: No obvious distress; patient is non-toxic appearing  Cardiovascular: No visible lower extremity edema.  Respiratory: The patient does not have audible wheezing/stridor; respirations do not appear labored  Gastrointestinal: Abdomen  non-distended Musculoskeletal: Normal ROM of UEs  Skin: No obvious rashes/open sores  Neurologic: CN 2-12 grossly intact Psychiatric: Answered questions appropriately with normal affect  Hematologic/Lymphatic/Immunologic: No obvious bruises or sites of spontaneous bleeding  Urine microscopy from voided specimen: >30 WBC/hpf, >30 RBC/hpf, many bacteria  Urine microscopy from I&O cath specimen: >30 WBC/hpf, >30 RBC/hpf, many bacteria  PVR: 0 ml  ASSESSMENT OAB (overactive bladder) - Plan: Urinalysis, Routine w reflex microscopic, BLADDER SCAN AMB NON-IMAGING  Recurrent UTI - Plan: Urine Culture, cephALEXin  (KEFLEX ) 250 MG capsule, sulfamethoxazole -trimethoprim  (BACTRIM ) 400-80 MG tablet  Vaginal atrophy  Female genital prolapse, unspecified type  Lichen sclerosus of female genitalia  Urge incontinence  UTI symptoms - Plan: Urine Culture, cephALEXin  (KEFLEX ) 250 MG capsule  Abnormal urinalysis - Plan: cephALEXin  (KEFLEX ) 250 MG capsule  Abnormal UA. Will check urine culture and treat empirically with Bactrim  (renal dosing) while awaiting culture results and sensitivities.  We discussed the possible etiologies of recurrent UTls including ascending infection related to intercourse; vaginal atrophy; transmural infection that has been treated incompletely; urinary tract stones; incomplete bladder emptying with urinary stasis; kidney or bladder tumor; urethral diverticulum; and colonization of  vagina and urinary tract with pathologic, adherent organisms.   For UTI prevention we discussed the following options, for which detailed information has been included in Patient Information section of today's After Visit Summary. > Maintain adequate fluid intake daily to flush out the urinary tract. > Go to the bathroom to urinate every 4-6 hours while awake to minimize urinary stasis / bacterial overgrowth in the bladder. > Proanthocyanidin (PAC) supplement 36 mg daily; must be soluble  (insoluble form of PAC will be ineffective). Recommend Ellura.  > D-mannose 2 g daily to minimize bacterial adherence in urinary tract > Vitamin C supplement to acidify urine to suppress bacterial growth > Probiotic to maintain healthy vaginal microbiome > Topical vaginal estrogen for vaginal atrophy.  > UTI prophylaxis with a daily low dose antibiotic. We discussed the potential risks of prolonged antibiotic treatment particularly with the risks of developing antibiotic resistance.   For her refractory OAB we again reviewed: - Failed behavioral therapy. - Not a safe candidate for anticholinergic medications due to history of stroke & TIA with memory loss and mild cognitive impairment, age, and risk for side effects.  - Allergic to Gemtesa ; not safe to trial Myrbetriq (also a beta-3 agonist medication). - Not a viable candidate for PTNS due to peripheral neuropathy. - Not a viable candidate for sacral neuromodulation. - Primary remaining option at this point is bladder Botox, which has been discussed in detail today and at prior visits. Patient / daughter decline this option because patient would be unable to self cath and doesn't want to risk even a temporary need for needing an indwelling Foley catheter post-procedure.   We agreed to plan for follow up in 3 months or sooner if needed. Patient verbalized understanding of and agreement with current plan. All questions were answered.  PLAN Advised the following: 1. Urine culture. 2. Bactrim  SS twice daily x7 days. 3. Keflex  (Cephalexin ) 250 mg daily for UTI prophylaxis. 4. Continue topical vaginal estrogen cream use. 5. Consider OTC supplements for UTI prevention. 6. Return in about 3 months (around 02/09/2024) for UA, PVR, & f/u with Griselda Lederer NP.  Orders Placed This Encounter  Procedures   Urine Culture   Urinalysis, Routine w reflex microscopic   BLADDER SCAN AMB NON-IMAGING    It has been explained that the patient is to follow  regularly with their PCP in addition to all other providers involved in their care and to follow instructions provided by these respective offices. Patient advised to contact urology clinic if any urologic-pertaining questions, concerns, new symptoms or problems arise in the interim period.  Patient Instructions  For refractory OAB we reviewed the procedure for intravesical Botox injection with cystoscopy in the office and reviewed the risks, benefits and alternatives of treatment including but not limited to infection, need for self catheterization and need for repeat therapy. We discussed that there is a 5-12% chance of needing to catheterize with  Botox and that this usually resolves in a few months; however can persist for longer periods of time. Typically Botox injections would need to be repeated every 3-12 months since this is not a permanent therapy.       UTI prevention / management:  Difference between Urinalysis (urine dipstick test) and Urine culture:  Urinalysis (urine dipstick test): A quick office test used as an indicator to determine whether or not further testing is necessary (such as a urine culture, urine microscopy, etc.) The urinalysis cannot differentiate a true bacterial UTI or give a definitive diagnosis for the findings.  Urine culture: May be performed based on the findings of a urinalysis to evaluate for UTI. Grows out on a petri dish for 48-72 hours. Provides important information about: whether or not bacterial growth is present and if so: what the predominant bacteria is which antibiotics will work best against that bacteria That information is important so that we can diagnose and treat patients appropriately as there are other conditions which may mimic UTls which must not be missed (such as cancer, interstitial cystitis, stones, etc.). Assists us  with antibiotic stewardship to minimize patient's risk for developing antibiotic resistance (getting to a point  where no antibiotics work anymore).  Options when UTI symptoms occur: 1. Call [facility] [place] and request to speak with triage nurse (phone # [phone], select option 3). In accordance with clinic guidelines the nurse will determine next steps based on patient-reported symptoms, which may include: same-day lab visit to provide urine specimen, recommendation to schedule Urology office visit appointment for further evaluation, recommendation to proceed to ER, etc. 2. Call your Primary Care Provider (PCP) office to request urgent / same-day visit. Be sure to request for urine culture to be ordered and have results faxed to Urology (fax # [phone]).  3. Go to urgent care. Be sure to request for urine culture to be ordered and have results faxed to Urology (fax # [phone]).   For bladder pain/ burning with urination: - Can take over-the-counter Pyridium (phenazopyridine; commonly known under the "AZO" brand) for a few days as needed. Limit use to no more than 3 days consecutively due to risk for methemoglobinemia, liver function issues, and bone health damage with long term use of Pyridium. - Alternative: Prescription urinary analgesics (such as Uribel, Urogesic blue, Urelle, Uro-MP). Often expensive / poorly covered by insurance unfortunately.  Options / recommendations for UTI prevention: - Low dose antibiotic daily for UTI prophylaxis. - Topical vaginal estrogen for vaginal atrophy (aka Genitourinary Syndrome of Menopause (GSM)). - Adequate daily fluid intake to flush out the urinary tract. - Go to the bathroom to urinate every 4-6 hours while awake to minimize urinary stasis / bacterial overgrowth in the bladder. - Proanthocyanidin (PAC) supplement 36 mg daily; must be soluble (insoluble form of PAC will be ineffective). Recommended brand: Ellura. This is an over-the-counter supplement (often must be found/ purchased online) supplement derived from cranberries with  concentrated active component: Proanthocyanidin (PAC) 36 mg daily. Decreases bacterial adherence to bladder lining.  - D-mannose powder (2 grams daily). This is an over-the-counter supplement which decreases bacterial adherence to bladder lining (it is a sugar that inhibits bacterial adherence to urothelial cells by binding to the pili of enteric bacteria). Take as per manufacturer recommendation. Can be used as an alternative or in addition to the concentrated cranberry supplement.  - Vitamin C supplement to acidify urine to minimize bacterial growth.  - Probiotic to maintain healthy vaginal microbiome to suppress  bacteria at urethral opening. Brand recommendations: Estill Hemming (includes probiotic & D-mannose ), Feminine Balance (highest concentration of lactobacillus) or Hyperbiotic Pro 15.  Note for patients with diabetes:  - Be aware that D-mannose contains sugar.  Note for patients with interstitial cystitis (IC):  - Patients with IC should typically avoid cranberry/ PAC supplements and Vitamin C supplements due to their acidity, which may exacerbate IC-related bladder pain. - Symptoms of true bacterial UTI can overlap / mimic symptoms of an IC flare up. Antibiotic use is NOT indicated for IC flare ups. Urine culture needed prior to antibiotic treatment for IC patients. The goal is to minimize your risk for developing antibiotic-resistant bacteria.    Vaginal atrophy I Genitourinary syndrome of menopause (GSM):  What it is: Changes in the vaginal environment (including the vulva and urethra) including: Thinning of the epithelium (skin/ mucosa surface) Can contribute to urinary urgency and frequency Can contribute to dryness, itching, irritation of the vulvar and vaginal tissue Can contribute to pain with intercourse Can contribute to physical changes of the labia, vulva, and vagina such as: Narrowing of the vaginal opening Decreased vaginal length Loss of labial architecture Labial  adhesions Pale color of vulvovaginal tissue  Loss of pubic hair Allows bacteria to become adherent  Results in increased risk for urinary tract infection (UTI) due to bacterial overgrowth and migration up the urethra into the bladder Change in vaginal pH (acid/ base balance) Allows for alteration / disruption of the normal bacterial flora / microbiome Results in increased risk for urinary tract infection (UTI) due to bacterial overgrowth  Treatment options: Over-the-counter lubricants (see list below). Prescription vaginal estrogen replacement. Options: Topical vaginal estrogen cream Estrace, Premarin, or compounded estradiol cream/ gel We advise: Discard plastic applicator as that tends to use more medication than you need, which is not harmful but wastes / uses up the medication. Also the plastic applicator may cause discomfort. Insert blueberry size amount of medication via the tip of your finger inside vagina nightly for 1 week then 2-3 times per week (long term). Estring vaginal ring Exchanged every 3 months (either at home or in office by provider) Vagifem vaginal tablet Inserted nightly for 2 weeks then twice a week (long term) lntrarosa vaginal suppository Vaginal DHEA: converts to estrogen in vaginal tissue without systemic effect Inserted nightly (long term) Vaginal laser therapy (Mona Lisa touch) Performed in 3 treatments each 6 weeks apart (available in our [place] office). Can feel like a sunburn for 3-4 days after each treatment until new skin heals in. Usually not covered by insurance. Estimated cost is $1500 for all 3 sessions.  FYI regarding prescription vaginal estrogen treatment options: All topical vaginal estrogen replacement options are equivalent in terms of efficacy. Topical vaginal estrogen replacement will take about 3 months to be effective. OK to have sex with any of the topical vaginal estrogen replacement options. Topical vaginal estrogen replacement  may sting/burn initially due to severe dryness, which will improve with ongoing treatment. There have been studies that evaluate use of low-dose intravaginal estrogen that show minimal systemic absorption which is negligible after 3 weeks. There have been no studies indicating increased risk of contributing to cancer development or recurrence.  Topical vaginal estrogen cream safe to use with breast cancer history [email or web address]  Topical vaginal estrogen cream safe to use with blood clot history [email or web address]   Lubricants and Moisturizers for Treating Genitourinary Syndrome of Menopause and Vulvovaginal Atrophy Treatment Comments I Available Products   Lubricants   Water -based Ingredients: Deionized  water , glycerin, propylene glycol; latex safe; rare irritation; dry out with extended sexual activity Astroglide, Good Clean Love, K-Y Jelly, Natural, Organic, Pink, Sliquid, Sylk, Yes    Oil Based Ingredients: avocado, olive, peanut, corn; latex safe; can be used with silicone products; staining; safe (unless peanut allergy); non-irritating Coconut oil, vegetable oil, vitamin E oil  Silicone-Based Ingredients: Silicone polymers; staining; typically nonirritating, long lasting; waterproof; should not be used with silicone dilators, sexual toys, or gynecologic products Astroglide X, Oceanus Ultra Pure, Pink Silicone, Pjur Eros, Replens Silky Smooth, Silicone Premium JO, SKYN, Uberlube, [employer] Based Minimize harm to sperm motility; designed Astroglide TTC, Conceive Plus, Pre for couples trying to conceive Seed, Yes Baby  Fertility Friendly Minimize harm to sperm motility; designed Astroglide, TTC, Conceive Plus, Pre for couples  trying to conceive Seed, Yes Baby  Vaginal Moisturizers   Vaginal Moisturizers For maintenance use 1 to 3 times weekly; can benefit women with dryness, chafing with AOL, and recurrent vaginal infections irrespective of sexual activity timing Balance Active Menopause Vaginal Moisturizing Lubricant, Canesintima Intimate Moisturizer, Replens, Rephresh, Sylk Natural Intimate Moisturizer, Yes Vaginal Moisturizer  Hybrids Properties of both water  and silicone-based products (combination of a vaginal lubricant and moisturizer); Non-irritating; good option for women with allergies and sensitivities Lubrigyn, Luvena  Suppositories Hyaluronic acid to retain moisture Revaree  Vulvar Soothing Creams/Oils    Medicated CreamsP ain and burn relief; Ingredients: 4% Lidocaine , Aloe Vera gel Releveum ([place])  Non-Medicated Creams For anti-itch and moisture/maintenance; Ingredients: Coconut oil, Avocado oil, Shea Butter, Olive oil, Vitamin E Vajuvenate, Vmagic  Oils !For moisture/maintenance !Coconut oil, Vitamin E oil, Emu oil     Electronically signed by:  Lauretta Ponto, FNP   11/10/23    3:02 PM

## 2023-11-11 LAB — MICROALBUMIN / CREATININE URINE RATIO
Creatinine, Urine: 74.9 mg/dL
Microalb/Creat Ratio: 348 mg/g{creat} — ABNORMAL HIGH (ref 0–29)
Microalbumin, Urine: 261 ug/mL

## 2023-11-11 LAB — SPECIMEN STATUS REPORT

## 2023-11-14 LAB — URINE CULTURE

## 2023-11-16 ENCOUNTER — Encounter: Payer: Self-pay | Admitting: Family Medicine

## 2023-11-21 ENCOUNTER — Other Ambulatory Visit: Payer: Self-pay

## 2023-11-21 DIAGNOSIS — N1832 Chronic kidney disease, stage 3b: Secondary | ICD-10-CM

## 2023-12-04 DIAGNOSIS — W57XXXA Bitten or stung by nonvenomous insect and other nonvenomous arthropods, initial encounter: Secondary | ICD-10-CM | POA: Diagnosis not present

## 2023-12-04 DIAGNOSIS — S60561A Insect bite (nonvenomous) of right hand, initial encounter: Secondary | ICD-10-CM | POA: Diagnosis not present

## 2023-12-07 ENCOUNTER — Other Ambulatory Visit: Payer: Self-pay | Admitting: Family Medicine

## 2023-12-10 MED ORDER — ALPRAZOLAM 0.25 MG PO TABS: ORAL_TABLET | ORAL | 0 refills | Status: DC

## 2023-12-14 DIAGNOSIS — L821 Other seborrheic keratosis: Secondary | ICD-10-CM | POA: Diagnosis not present

## 2023-12-14 DIAGNOSIS — L218 Other seborrheic dermatitis: Secondary | ICD-10-CM | POA: Diagnosis not present

## 2023-12-14 DIAGNOSIS — L814 Other melanin hyperpigmentation: Secondary | ICD-10-CM | POA: Diagnosis not present

## 2023-12-14 DIAGNOSIS — D1801 Hemangioma of skin and subcutaneous tissue: Secondary | ICD-10-CM | POA: Diagnosis not present

## 2023-12-14 DIAGNOSIS — L820 Inflamed seborrheic keratosis: Secondary | ICD-10-CM | POA: Diagnosis not present

## 2023-12-14 DIAGNOSIS — D485 Neoplasm of uncertain behavior of skin: Secondary | ICD-10-CM | POA: Diagnosis not present

## 2023-12-14 DIAGNOSIS — L308 Other specified dermatitis: Secondary | ICD-10-CM | POA: Diagnosis not present

## 2023-12-14 DIAGNOSIS — N1832 Chronic kidney disease, stage 3b: Secondary | ICD-10-CM | POA: Diagnosis not present

## 2023-12-15 LAB — BASIC METABOLIC PANEL WITH GFR
BUN/Creatinine Ratio: 16 (ref 12–28)
BUN: 25 mg/dL (ref 8–27)
CO2: 22 mmol/L (ref 20–29)
Calcium: 10.3 mg/dL (ref 8.7–10.3)
Chloride: 103 mmol/L (ref 96–106)
Creatinine, Ser: 1.54 mg/dL — ABNORMAL HIGH (ref 0.57–1.00)
Glucose: 89 mg/dL (ref 70–99)
Potassium: 5 mmol/L (ref 3.5–5.2)
Sodium: 140 mmol/L (ref 134–144)
eGFR: 32 mL/min/{1.73_m2} — ABNORMAL LOW (ref >59)

## 2023-12-15 LAB — MICROALBUMIN / CREATININE URINE RATIO
Creatinine, Urine: 83.3 mg/dL
Microalb/Creat Ratio: 191 mg/g{creat} — ABNORMAL HIGH (ref 0–29)
Microalbumin, Urine: 159.3 ug/mL

## 2023-12-18 ENCOUNTER — Ambulatory Visit: Payer: Self-pay | Admitting: Family Medicine

## 2023-12-27 DIAGNOSIS — M79675 Pain in left toe(s): Secondary | ICD-10-CM | POA: Diagnosis not present

## 2023-12-27 DIAGNOSIS — M79671 Pain in right foot: Secondary | ICD-10-CM | POA: Diagnosis not present

## 2023-12-27 DIAGNOSIS — I739 Peripheral vascular disease, unspecified: Secondary | ICD-10-CM | POA: Diagnosis not present

## 2023-12-27 DIAGNOSIS — M79674 Pain in right toe(s): Secondary | ICD-10-CM | POA: Diagnosis not present

## 2023-12-27 DIAGNOSIS — L110 Acquired keratosis follicularis: Secondary | ICD-10-CM | POA: Diagnosis not present

## 2023-12-27 DIAGNOSIS — M79672 Pain in left foot: Secondary | ICD-10-CM | POA: Diagnosis not present

## 2023-12-29 NOTE — Progress Notes (Signed)
 Name: Becky Baird DOB: 06-16-1934 MRN: [id number]  History of Present Illness: Becky Baird is a 89 y.o. female who presents today for follow up visit at [facility] [place]. She is accompanied by daughter Becky Baird, who assists with providing history due to patient's cognitive impairment. Relevant History includes: 1. Recurrent UTI. - Prior episode of sepsis in September 2019. - Requires I&O cath urine specimen any time UTI is suspected. On 05/16/2023 her voided urine sample appeared infected however I&O catheterized urine sample was normal. Based on this finding it is highly likely that her voided urine specimens are unreliable for evaluation of UTI due to vaginal contamination.  2. OAB with urinary frequency, nocturia, urgency, and urge incontinence. - Failed behavioral therapy. - Not a safe candidate for anticholinergic medications due to history of stroke & TIA with memory loss and mild cognitive impairment, age, and risk for side effects.  - Allergic to Gemtesa ; not safe to trial Myrbetriq (also a beta-3 agonist medication). - Not a viable candidate for PTNS due to peripheral neuropathy. - Not a viable candidate for sacral neuromodulation. 3. Stress urinary incontinence. Improved with pessary. Not significantly bothersome per patient. 4. Pelvic organ prolapse (cystocele). - Has a #5 incontinence ring pessary which is managed by her GYN provider. 5. Vaginal atrophy. Uses topical vaginal estrogen cream daily as prescribed by her gynecologist. 6. Lichen sclerosus. Uses Clobetasol as prescribed by her gynecologist.   Urine culture results in past 12 months: - 01/05/2023: Positive for Enterobacter cloacae complex - 02/07/2023: Positive for E. Coli; asymptomatic (no antibiotic prescribed) - 04/12/2023: Positive for Proteus mirabilis; asymptomatic (no antibiotic prescribed) - 05/03/2023: Positive for Proteus mirabilis; asymptomatic (treated with Cipro  by GYN provider) -  11/10/2023: Positive for E. Coli; symptomatic (treated with Bactrim  DS)   At last visit on 11/10/2023: - Symptomatic for acute UTI.  - The plan was:   1. Urine culture (positive for E. Coli). 2. Bactrim  SS twice daily x7 days. 3. Keflex  (Cephalexin ) 250 mg daily for UTI prophylaxis. 4. Continue topical vaginal estrogen cream use. 5. Consider OTC supplements for UTI prevention. 6. Return in about 3 months (around 02/09/2024) for UA, PVR, & f/u with Griselda Lederer NP.  Today: She reports doing well. Denies UTI symptoms. Reports her OAB symptoms remain well managed with the exception of ongoing nocturia almost hourly. Denies prior sleep study.   She has been using vaginal estrogen cream at a frequency of 2 time(s) per week.   Medications: Current Outpatient Medications  Medication Sig Dispense Refill   ALPRAZolam  (XANAX ) 0.25 MG tablet 1 three times daily as needed 90 tablet 2   cephALEXin  (KEFLEX ) 250 MG capsule Take 1 capsule (250 mg total) by mouth daily. For long term use to prevent recurrent UTIs. 30 capsule 5   clobetasol ointment (TEMOVATE) 0.05 % Apply 1 application topically 2 (two) times daily as needed.     cyanocobalamin (VITAMIN B12) 1000 MCG tablet Take 1 tablet (1,000 mcg total) by mouth daily. 30 tablet 0   estradiol (ESTRACE) 0.1 MG/GM vaginal cream Place 1 Applicatorful vaginally once a week.     hydrochlorothiazide  (HYDRODIURIL ) 25 MG tablet take 1 tablet by mouth once daily stop amlodipine  90 tablet 0   Ketotifen Fumarate (ALAWAY OP) Apply 1 drop to eye daily as needed.     loratadine  (CLARITIN ) 10 MG tablet Take 1 tablet (10 mg total) by mouth daily. (Patient taking differently: Take 10 mg by mouth at bedtime.) 30 tablet 5   OVER  THE COUNTER MEDICATION Take 1 capsule by mouth every morning. SUPREMA DOPHILUS     OVER THE COUNTER MEDICATION Take by mouth daily. Vitamin D3 2,000 units daily     pantoprazole  (PROTONIX ) 40 MG tablet Take 1 tablet (40 mg total) by mouth daily.  30 tablet 0   potassium chloride  (KLOR-CON  M) 10 MEQ tablet Take 1 tablet (10 mEq total) by mouth 2 (two) times daily. 60 tablet 2   potassium chloride  (KLOR-CON ) 10 MEQ tablet Take 1 tablet (10 mEq total) by mouth 2 (two) times daily. 60 tablet 0   sodium chloride  (OCEAN) 0.65 % nasal spray Place 1 spray into the nose as needed for congestion.      XARELTO  15 MG TABS tablet take 1 tablet by mouth daily with supper 90 tablet 0   sulfamethoxazole -trimethoprim  (BACTRIM ) 400-80 MG tablet Take 1 tablet by mouth 2 (two) times daily. (Patient not taking: Reported on 01/09/2024) 7 tablet 0   No current facility-administered medications for this visit.    Allergies: Allergies  Allergen Reactions   Amlodipine      Severe pedal edema with 2.5 mg use   Gemtesa  [Vibegron ] Itching    allover body itching and burning   Statins Other (See Comments)    Myalgias with rosuvastatin   Neurontin  [Gabapentin ]     Cognitive issues, creates fogginess   Niaspan [Niacin Er (Antihyperlipidemic)] Rash   Oxybutynin     Nausea sweats    Past Medical History:  Diagnosis Date   Anxiety    Chronic back pain    Hyperlipidemia    Hypertension    Memory loss    Mild cognitive impairment, so stated    Occlusion and stenosis of carotid artery with cerebral infarction    Bilateral less than 50% carotid stenosis.   Persistent atrial fibrillation (HCC) 06/23/2016   Renal cyst    Sciatica    Stroke (HCC)    2010, RMCA with stent   Toe fracture 11/2015   R toe   Ulcer 1970s   Past Surgical History:  Procedure Laterality Date   BIOPSY  04/25/2018   Procedure: BIOPSY;  Surgeon: Ruby Corporal, MD;  [facility]: [facility];  Service: Endoscopy;;  gastric polyps   COLONOSCOPY  03/10/2011   Procedure: COLONOSCOPY;  Surgeon: Ruby Corporal, MD;  [facility]: [facility];  Service: Endoscopy;  Laterality: N/A;   COLONOSCOPY N/A 08/24/2016   Procedure: COLONOSCOPY;  Surgeon: Ruby Corporal, MD;  [facility];  Service: Endoscopy;  Laterality: N/A;  930   DILATION AND CURETTAGE OF UTERUS     years ago   ESOPHAGOGASTRODUODENOSCOPY N/A 04/25/2018   Procedure: ESOPHAGOGASTRODUODENOSCOPY (EGD);  Surgeon: Ruby Corporal, MD;  Location: [facility];  Service: Endoscopy;  Laterality: N/A;  1:15   OVARIAN CYST REMOVAL     at least 10 years ago   POLYPECTOMY  08/24/2016   Procedure: POLYPECTOMY;  Surgeon: Ruby Corporal, MD;  Location: [facility];  Service: Endoscopy;;  ascending colon; hepatic flexure   TONSILLECTOMY     childhood   Family History  Problem Relation Age of Onset   Stroke Mother    Heart failure Father    Diabetes Brother    Social History   Socioeconomic History   Marital status: Widowed    Spouse name: Not on file   Number of children: 2   Years of education: College   Highest education level: 12th grade  Occupational History  Employer: RETIRED  Tobacco Use   Smoking status: Never   Smokeless tobacco: Never  Vaping Use   Vaping status: Never Used  Substance and Sexual Activity   Alcohol  use: No    Alcohol /week: 0.0 standard drinks of alcohol    Drug use: No   Sexual activity: Not Currently  Other Topics Concern   Not on file  Social History Narrative   Patient lives at home alone.    2 wonderful daughters per pt.    3 grandchildren.   Caffeine Use: very little, rarely   Social Drivers of Corporate investment banker Strain: Low Risk  (06/08/2021)   Overall Financial Resource Strain (CARDIA)    Difficulty of Paying Living Expenses: Not hard at all  Food Insecurity: No Food Insecurity (06/08/2021)   Hunger Vital Sign    Worried About Running Out of Food in the Last Year: Never true    Ran Out of Food in the Last Year: Never true  Transportation Needs: No Transportation Needs (06/08/2021)   PRAPARE - Administrator, Civil Service (Medical): No    Lack of Transportation (Non-Medical): No  Physical Activity: Insufficiently Active  (06/08/2021)   Exercise Vital Sign    Days of Exercise per Week: 5 days    Minutes of Exercise per Session: 20 min  Stress: No Stress Concern Present (06/08/2021)   [employer] of Occupational Health - Occupational Stress Questionnaire    Feeling of Stress : Not at all  Social Connections: Socially Isolated (06/08/2021)   Social Connection and Isolation Panel    Frequency of Communication with Friends and Family: More than three times a week    Frequency of Social Gatherings with Friends and Family: More than three times a week    Attends Religious Services: Never    Database administrator or Organizations: No    Attends Banker Meetings: Never    Marital Status: Widowed  Intimate Partner Violence: Not At Risk (06/08/2021)   Humiliation, Afraid, Rape, and Kick questionnaire    Fear of Current or Ex-Partner: No    Emotionally Abused: No    Physically Abused: No    Sexually Abused: No    Review of Systems Constitutional: Patient denies any unintentional weight loss or change in strength lntegumentary: Patient denies any rashes or pruritus Cardiovascular: Patient denies chest pain or syncope Respiratory: Patient denies shortness of breath Gastrointestinal: Patient denies nausea, vomiting, constipation, or diarrhea  Musculoskeletal: Patient denies muscle cramps or weakness Neurologic: Patient denies convulsions or seizures Allergic/Immunologic: Patient denies recent allergic reaction(s) Hematologic/Lymphatic: Patient denies bleeding tendencies Endocrine: Patient denies heat/cold intolerance  GU: As per HPI.  OBJECTIVE Vitals:   01/09/24 1357  BP: (!) 147/60  Pulse: 67   There is no height or weight on file to calculate BMI.  Physical Examination Constitutional: No obvious distress; patient is non-toxic appearing  Cardiovascular: No visible lower extremity edema.  Respiratory: The patient does not have audible wheezing/stridor; respirations do not appear  labored  Gastrointestinal: Abdomen non-distended Musculoskeletal: Normal ROM of UEs  Skin: No obvious rashes/open sores  Neurologic: CN 2-12 grossly intact Psychiatric: Answered questions appropriately with normal affect  Hematologic/Lymphatic/Immunologic: No obvious bruises or sites of spontaneous bleeding  UA: negative  PVR: 0 ml  ASSESSMENT Recurrent UTI - Plan: In and Out Cath, Urinalysis, Routine w reflex microscopic, BLADDER SCAN AMB NON-IMAGING  OAB (overactive bladder) - Plan: In and Out Cath, Urinalysis, Routine w reflex microscopic, BLADDER SCAN AMB NON-IMAGING  Vaginal atrophy - Plan: In and Out Cath, Urinalysis, Routine w reflex microscopic, BLADDER SCAN AMB NON-IMAGING  Lichen sclerosus of female genitalia - Plan: In and Out Cath, Urinalysis, Routine w reflex microscopic, BLADDER SCAN AMB NON-IMAGING  For nocturia: We reviewed possible etiologies for nocturia including but not limited to: caffeine intake, evening fluid intake, sleep apnea, peripheral edema, reduced bladder capacity, renal tubular dysfunction. We discussed options for further evaluation including a sleep study to evaluate for obstructive sleep apnea, which patient was informed can contribute to nocturia and/or nocturnal polyuria. We reviewed management options including: Behavioral changes: Minimizing caffeine intake (especially within 6-8 hours before bedtime). Minimizing fluid intake within 3 hours before bedtime. Minimizing overnight fluid intake. Reducing salt / sodium intake. If bilateral lower extremity edema is present: Elevating feet during the day and/or wearing compression socks. Medications: Not a candidate for OAB meds Desmopressin (DDAVP) to decrease urine production at night.   She elected to proceed with expectant management for her nocturia for now.   Will plan for follow up in 3 months or sooner if needed. Pt verbalized understanding and agreement. All questions were  answered.   PLAN Advised the following: 1. Continue Keflex  (Cephalexin ) 250 mg daily for UTI prophylaxis. 2. Continue topical vaginal estrogen cream. 3. Return in about 3 months (around 04/10/2024) for Recurrent UTI, with UA & PVR.  Orders Placed This Encounter  Procedures   Urinalysis, Routine w reflex microscopic   In and Out Cath   BLADDER SCAN AMB NON-IMAGING   Total time spent caring for the patient today was over 30 minutes. This includes time spent on the date of the visit reviewing the patient's chart before the visit, time spent during the visit, and time spent after the visit on documentation. Over 50% of that time was spent in face-to-face time with this patient for direct counseling. E&M based on time and complexity of medical decision making.  It has been explained that the patient is to follow regularly with their PCP in addition to all other providers involved in their care and to follow instructions provided by these respective offices. Patient advised to contact urology clinic if any urologic-pertaining questions, concerns, new symptoms or problems arise in the interim period.  Patient Instructions  For night time urination (nocturia): Behavioral changes: Minimizing caffeine intake (especially within 6-8 hours before bedtime). Minimizing fluid intake within 3 hours before bedtime. Minimizing overnight fluid intake. Reducing salt / sodium intake. If bilateral lower extremity edema is present: Elevating feet during the day and/or wearing compression socks.  We discussed option to consider referral to Sleep Medicine specialty for asleep study to evaluate for obstructive sleep apnea (OSA), which could potentially contributing to nocturia and/or nocturnal polyuria (overproduction of urine at night).  Also discussed option to trial Desmopressin (DDAVP) 0.1mg  nightly one hour prior to bedtime to decrease urine production at night.  - We discussed the risks related to  hyponatremia and the importance of limiting fluid intake for 8 hours after taking Desmopressin.  - Would need to check sodium level 2 weeks after starting and again in 3-6 months.     Electronically signed by:  Lauretta Ponto, FNP   01/09/24    3:22 PM

## 2024-01-07 ENCOUNTER — Other Ambulatory Visit: Payer: Self-pay | Admitting: Family Medicine

## 2024-01-08 ENCOUNTER — Other Ambulatory Visit: Payer: Self-pay

## 2024-01-08 MED ORDER — PANTOPRAZOLE SODIUM 40 MG PO TBEC: 40.0000 mg | DELAYED_RELEASE_TABLET | Freq: Every day | ORAL | 0 refills | Status: DC

## 2024-01-08 MED ORDER — POTASSIUM CHLORIDE CRYS ER 10 MEQ PO TBCR: 10.0000 meq | EXTENDED_RELEASE_TABLET | Freq: Two times a day (BID) | ORAL | 2 refills | Status: DC

## 2024-01-09 ENCOUNTER — Encounter: Payer: Self-pay | Admitting: Urology

## 2024-01-09 ENCOUNTER — Other Ambulatory Visit: Payer: Self-pay

## 2024-01-09 ENCOUNTER — Ambulatory Visit (INDEPENDENT_AMBULATORY_CARE_PROVIDER_SITE_OTHER): Payer: Medicare Other | Admitting: Urology

## 2024-01-09 VITALS — BP 147/60 | HR 67

## 2024-01-09 DIAGNOSIS — N904 Leukoplakia of vulva: Secondary | ICD-10-CM | POA: Diagnosis not present

## 2024-01-09 DIAGNOSIS — Z87440 Personal history of urinary (tract) infections: Secondary | ICD-10-CM

## 2024-01-09 DIAGNOSIS — N390 Urinary tract infection, site not specified: Secondary | ICD-10-CM

## 2024-01-09 DIAGNOSIS — N952 Postmenopausal atrophic vaginitis: Secondary | ICD-10-CM

## 2024-01-09 DIAGNOSIS — N3281 Overactive bladder: Secondary | ICD-10-CM | POA: Diagnosis not present

## 2024-01-09 LAB — URINALYSIS, ROUTINE W REFLEX MICROSCOPIC
Bilirubin, UA: NEGATIVE
Glucose, UA: NEGATIVE
Ketones, UA: NEGATIVE
Leukocytes,UA: NEGATIVE
Nitrite, UA: NEGATIVE
Protein,UA: NEGATIVE
RBC, UA: NEGATIVE
Specific Gravity, UA: 1.015 (ref 1.005–1.030)
Urobilinogen, Ur: 0.2 mg/dL (ref 0.2–1.0)
pH, UA: 6 (ref 5.0–7.5)

## 2024-01-09 LAB — BLADDER SCAN AMB NON-IMAGING: Scan Result: 0

## 2024-01-09 MED ORDER — ALPRAZOLAM 0.25 MG PO TABS: ORAL_TABLET | ORAL | 2 refills | Status: DC

## 2024-01-09 MED ORDER — POTASSIUM CHLORIDE CRYS ER 10 MEQ PO TBCR: 10.0000 meq | EXTENDED_RELEASE_TABLET | Freq: Two times a day (BID) | ORAL | 2 refills | Status: DC

## 2024-01-09 MED ORDER — POTASSIUM CHLORIDE ER 10 MEQ PO TBCR: 10.0000 meq | EXTENDED_RELEASE_TABLET | Freq: Two times a day (BID) | ORAL | 0 refills | Status: DC

## 2024-01-09 NOTE — Patient Instructions (Signed)
 For night time urination (nocturia): Behavioral changes: Minimizing caffeine intake (especially within 6-8 hours before bedtime). Minimizing fluid intake within 3 hours before bedtime. Minimizing overnight fluid intake. Reducing salt / sodium intake. If bilateral lower extremity edema is present: Elevating feet during the day and/or wearing compression socks.  We discussed option to consider referral to Sleep Medicine specialty for asleep study to evaluate for obstructive sleep apnea (OSA), which could potentially contributing to nocturia and/or nocturnal polyuria (overproduction of urine at night).  Also discussed option to trial Desmopressin (DDAVP) 0.1mg  nightly one hour prior to bedtime to decrease urine production at night.  - We discussed the risks related to hyponatremia and the importance of limiting fluid intake for 8 hours after taking Desmopressin.  - Would need to check sodium level 2 weeks after starting and again in 3-6 months.

## 2024-01-15 ENCOUNTER — Encounter (HOSPITAL_COMMUNITY): Payer: Self-pay | Admitting: Family Medicine

## 2024-02-05 ENCOUNTER — Other Ambulatory Visit: Payer: Self-pay | Admitting: Family Medicine

## 2024-03-08 ENCOUNTER — Other Ambulatory Visit: Payer: Self-pay | Admitting: Family Medicine

## 2024-03-11 ENCOUNTER — Other Ambulatory Visit: Payer: Self-pay

## 2024-03-11 MED ORDER — PANTOPRAZOLE SODIUM 40 MG PO TBEC: 40.0000 mg | DELAYED_RELEASE_TABLET | Freq: Every day | ORAL | 1 refills | Status: DC

## 2024-03-11 MED ORDER — HYDROCHLOROTHIAZIDE 25 MG PO TABS: ORAL_TABLET | ORAL | 0 refills | Status: DC

## 2024-03-12 ENCOUNTER — Other Ambulatory Visit: Payer: Self-pay | Admitting: Family Medicine

## 2024-03-14 DIAGNOSIS — I4891 Unspecified atrial fibrillation: Secondary | ICD-10-CM | POA: Diagnosis not present

## 2024-03-14 DIAGNOSIS — N838 Other noninflammatory disorders of ovary, fallopian tube and broad ligament: Secondary | ICD-10-CM | POA: Diagnosis not present

## 2024-03-14 DIAGNOSIS — K5730 Diverticulosis of large intestine without perforation or abscess without bleeding: Secondary | ICD-10-CM | POA: Diagnosis not present

## 2024-03-14 DIAGNOSIS — N281 Cyst of kidney, acquired: Secondary | ICD-10-CM | POA: Diagnosis not present

## 2024-03-14 DIAGNOSIS — Z7901 Long term (current) use of anticoagulants: Secondary | ICD-10-CM | POA: Diagnosis not present

## 2024-03-14 DIAGNOSIS — I129 Hypertensive chronic kidney disease with stage 1 through stage 4 chronic kidney disease, or unspecified chronic kidney disease: Secondary | ICD-10-CM | POA: Diagnosis not present

## 2024-03-14 DIAGNOSIS — N939 Abnormal uterine and vaginal bleeding, unspecified: Secondary | ICD-10-CM | POA: Diagnosis not present

## 2024-03-14 DIAGNOSIS — N3001 Acute cystitis with hematuria: Secondary | ICD-10-CM | POA: Diagnosis not present

## 2024-03-14 DIAGNOSIS — B379 Candidiasis, unspecified: Secondary | ICD-10-CM | POA: Diagnosis not present

## 2024-03-15 ENCOUNTER — Telehealth: Payer: Self-pay | Admitting: Urology

## 2024-03-15 ENCOUNTER — Ambulatory Visit: Admitting: Family Medicine

## 2024-03-15 ENCOUNTER — Encounter: Payer: Self-pay | Admitting: Family Medicine

## 2024-03-15 NOTE — Telephone Encounter (Signed)
 Patient initially went to the hospital for vaginal bleeding.  The hospital did a clean catch urine and her daughter states she was told by Lauraine to always self cath for urinalysis because pt has natural bacteria that will show false positive for infection.  Patient had no other symptoms of a UTI at that time but the ua results determined patient did have an infection.  She was started on an antibiotic but the daughter is asking if she even needs to take the antibiotic and questions the accuracy of results.  Lauraine had started pt on a preventative abx.  Please advise on how to proceed.

## 2024-03-15 NOTE — Telephone Encounter (Signed)
 Mother went to ER in [place] and has bladder infection. Sarah told them to always get cath catch not regular and wants to talk to nurse.

## 2024-03-15 NOTE — Telephone Encounter (Signed)
 Verbal from Dr. Sherrilee patient does not have to take antibiotics if she is not symptomatic.  Daughter informed of MD response.

## 2024-03-19 DIAGNOSIS — L900 Lichen sclerosus et atrophicus: Secondary | ICD-10-CM | POA: Diagnosis not present

## 2024-03-19 DIAGNOSIS — N8110 Cystocele, unspecified: Secondary | ICD-10-CM | POA: Diagnosis not present

## 2024-03-19 DIAGNOSIS — L110 Acquired keratosis follicularis: Secondary | ICD-10-CM | POA: Diagnosis not present

## 2024-03-19 DIAGNOSIS — N839 Noninflammatory disorder of ovary, fallopian tube and broad ligament, unspecified: Secondary | ICD-10-CM | POA: Diagnosis not present

## 2024-03-19 DIAGNOSIS — M79672 Pain in left foot: Secondary | ICD-10-CM | POA: Diagnosis not present

## 2024-03-19 DIAGNOSIS — M79674 Pain in right toe(s): Secondary | ICD-10-CM | POA: Diagnosis not present

## 2024-03-19 DIAGNOSIS — M79671 Pain in right foot: Secondary | ICD-10-CM | POA: Diagnosis not present

## 2024-03-19 DIAGNOSIS — T839XXA Unspecified complication of genitourinary prosthetic device, implant and graft, initial encounter: Secondary | ICD-10-CM | POA: Diagnosis not present

## 2024-03-19 DIAGNOSIS — N390 Urinary tract infection, site not specified: Secondary | ICD-10-CM | POA: Diagnosis not present

## 2024-03-19 DIAGNOSIS — Z4689 Encounter for fitting and adjustment of other specified devices: Secondary | ICD-10-CM | POA: Diagnosis not present

## 2024-03-19 DIAGNOSIS — I739 Peripheral vascular disease, unspecified: Secondary | ICD-10-CM | POA: Diagnosis not present

## 2024-03-19 DIAGNOSIS — M79675 Pain in left toe(s): Secondary | ICD-10-CM | POA: Diagnosis not present

## 2024-03-26 DIAGNOSIS — K5730 Diverticulosis of large intestine without perforation or abscess without bleeding: Secondary | ICD-10-CM | POA: Diagnosis not present

## 2024-03-26 DIAGNOSIS — N83291 Other ovarian cyst, right side: Secondary | ICD-10-CM | POA: Diagnosis not present

## 2024-03-26 DIAGNOSIS — N838 Other noninflammatory disorders of ovary, fallopian tube and broad ligament: Secondary | ICD-10-CM | POA: Diagnosis not present

## 2024-03-27 DIAGNOSIS — N390 Urinary tract infection, site not specified: Secondary | ICD-10-CM | POA: Diagnosis not present

## 2024-03-27 DIAGNOSIS — N83201 Unspecified ovarian cyst, right side: Secondary | ICD-10-CM | POA: Diagnosis not present

## 2024-03-27 DIAGNOSIS — N8110 Cystocele, unspecified: Secondary | ICD-10-CM | POA: Diagnosis not present

## 2024-03-27 DIAGNOSIS — T839XXD Unspecified complication of genitourinary prosthetic device, implant and graft, subsequent encounter: Secondary | ICD-10-CM | POA: Diagnosis not present

## 2024-04-07 ENCOUNTER — Other Ambulatory Visit: Payer: Self-pay | Admitting: Family Medicine

## 2024-04-11 ENCOUNTER — Ambulatory Visit: Admitting: Urology

## 2024-04-23 DIAGNOSIS — Z23 Encounter for immunization: Secondary | ICD-10-CM | POA: Diagnosis not present

## 2024-05-06 DIAGNOSIS — N390 Urinary tract infection, site not specified: Secondary | ICD-10-CM | POA: Diagnosis not present

## 2024-05-06 DIAGNOSIS — N8110 Cystocele, unspecified: Secondary | ICD-10-CM | POA: Diagnosis not present

## 2024-05-09 ENCOUNTER — Other Ambulatory Visit: Payer: Self-pay | Admitting: Family Medicine

## 2024-05-13 ENCOUNTER — Telehealth: Payer: Self-pay | Admitting: Urology

## 2024-05-13 ENCOUNTER — Other Ambulatory Visit: Payer: Self-pay | Admitting: Family Medicine

## 2024-05-13 ENCOUNTER — Encounter: Payer: Self-pay | Admitting: Family Medicine

## 2024-05-13 DIAGNOSIS — R399 Unspecified symptoms and signs involving the genitourinary system: Secondary | ICD-10-CM

## 2024-05-13 DIAGNOSIS — R8290 Unspecified abnormal findings in urine: Secondary | ICD-10-CM

## 2024-05-13 DIAGNOSIS — N390 Urinary tract infection, site not specified: Secondary | ICD-10-CM

## 2024-05-13 NOTE — Telephone Encounter (Signed)
 The patient called to request a medication refill of Keflex . Patient would like the medication sent to [facility] pharmacy.

## 2024-05-13 NOTE — Telephone Encounter (Unsigned)
 Copied from CRM [id number]. Topic: Clinical - Medication Refill >> May 13, 2024  1:58 PM Ivette P wrote: Medication: ALPRAZolam  (XANAX ) 0.25 MG tablet  Has the patient contacted their pharmacy? Yes (Agent: If no, request that the patient contact the pharmacy for the refill. If patient does not wish to contact the pharmacy document the reason why and proceed with request.) (Agent: If yes, when and what did the pharmacy advise?)  This is the patient's preferred pharmacy:  [facility] - [place], [place] - [address] [place] [address] Phone: [phone] Fax: [phone]  Is this the correct pharmacy for this prescription? Yes If no, delete pharmacy and type the correct one.   Has the prescription been filled recently? No  Is the patient out of the medication? Yes, run out end of month  Has the patient been seen for an appointment in the last year OR does the patient have an upcoming appointment? Yes  Can we respond through MyChart? Yes  Agent: Please be advised that Rx refills may take up to 3 business days. We ask that you follow-up with your pharmacy.

## 2024-05-13 NOTE — Telephone Encounter (Signed)
 Return call to pt making her aware a message has been sent to Dr. Sherrilee for refill of Keflex  with no answer. :Lvm for return call.

## 2024-05-14 MED ORDER — CEPHALEXIN 250 MG PO CAPS: 250.0000 mg | ORAL_CAPSULE | Freq: Every day | ORAL | 5 refills | Status: DC

## 2024-05-14 NOTE — Telephone Encounter (Signed)
 Rx refilled.

## 2024-05-14 NOTE — Telephone Encounter (Signed)
 Daughter called back checking on RX she will be out tomorrow

## 2024-05-21 ENCOUNTER — Telehealth: Payer: Self-pay

## 2024-05-21 ENCOUNTER — Other Ambulatory Visit: Payer: Self-pay | Admitting: Family Medicine

## 2024-05-21 DIAGNOSIS — N8110 Cystocele, unspecified: Secondary | ICD-10-CM | POA: Diagnosis not present

## 2024-05-21 DIAGNOSIS — Z23 Encounter for immunization: Secondary | ICD-10-CM | POA: Diagnosis not present

## 2024-05-21 DIAGNOSIS — N390 Urinary tract infection, site not specified: Secondary | ICD-10-CM | POA: Diagnosis not present

## 2024-05-21 MED ORDER — ALPRAZOLAM 0.25 MG PO TABS: ORAL_TABLET | ORAL | 2 refills | Status: DC

## 2024-05-21 NOTE — Telephone Encounter (Signed)
 Refills were updated

## 2024-05-21 NOTE — Telephone Encounter (Signed)
 Copied from CRM [id number]. Topic: Clinical - Medication Question >> May 20, 2024 10:46 AM Avram MATSU wrote: Reason for CRM: Becky Baird is calling to check the status of medication ALPRAZolam  (XANAX ) 0.25 MG tablet [[id number]]P informed the pt the medication was denied. please advise [phone]

## 2024-05-29 DIAGNOSIS — M79674 Pain in right toe(s): Secondary | ICD-10-CM | POA: Diagnosis not present

## 2024-05-29 DIAGNOSIS — M79675 Pain in left toe(s): Secondary | ICD-10-CM | POA: Diagnosis not present

## 2024-05-29 DIAGNOSIS — I739 Peripheral vascular disease, unspecified: Secondary | ICD-10-CM | POA: Diagnosis not present

## 2024-05-29 DIAGNOSIS — M79672 Pain in left foot: Secondary | ICD-10-CM | POA: Diagnosis not present

## 2024-05-29 DIAGNOSIS — L110 Acquired keratosis follicularis: Secondary | ICD-10-CM | POA: Diagnosis not present

## 2024-05-29 DIAGNOSIS — M79671 Pain in right foot: Secondary | ICD-10-CM | POA: Diagnosis not present

## 2024-06-05 ENCOUNTER — Other Ambulatory Visit: Payer: Self-pay | Admitting: Family Medicine

## 2024-06-05 ENCOUNTER — Ambulatory Visit (INDEPENDENT_AMBULATORY_CARE_PROVIDER_SITE_OTHER): Admitting: Urology

## 2024-06-05 VITALS — BP 146/50 | HR 70

## 2024-06-05 DIAGNOSIS — N3281 Overactive bladder: Secondary | ICD-10-CM | POA: Diagnosis not present

## 2024-06-05 DIAGNOSIS — Z87440 Personal history of urinary (tract) infections: Secondary | ICD-10-CM | POA: Diagnosis not present

## 2024-06-05 DIAGNOSIS — N390 Urinary tract infection, site not specified: Secondary | ICD-10-CM | POA: Diagnosis not present

## 2024-06-05 DIAGNOSIS — R399 Unspecified symptoms and signs involving the genitourinary system: Secondary | ICD-10-CM

## 2024-06-05 DIAGNOSIS — R8290 Unspecified abnormal findings in urine: Secondary | ICD-10-CM

## 2024-06-05 MED ORDER — CEPHALEXIN 250 MG PO CAPS: 250.0000 mg | ORAL_CAPSULE | Freq: Every day | ORAL | 5 refills | Status: AC

## 2024-06-05 MED ORDER — TROSPIUM CHLORIDE ER 60 MG PO CP24: 1.0000 | ORAL_CAPSULE | Freq: Every day | ORAL | 11 refills | Status: DC

## 2024-06-05 NOTE — Progress Notes (Addendum)
 06/05/2024 1:46 PM   Becky Baird 03-Feb-1934 [id number]  Referring provider: Alphonsa Baird LABOR, MD [address] [place],  [place] [address]  Followup OAb    HPI: Ms Becky Baird is a 89yo here for followup for recurrent UTi and OAB. She had a new pessary placed which is working well. She is on keflex  prophylaxis and no UTIs since last visit. She was treated with gemtesa  and had an an allergic reaction. She was previously tried on an anticholinergic but had confusion and memory impairment. She has nocturia 4-5x.    PMH: Past Medical History:  Diagnosis Date   Anxiety    Chronic back pain    Hyperlipidemia    Hypertension    Memory loss    Mild cognitive impairment, so stated    Occlusion and stenosis of carotid artery with cerebral infarction    Bilateral less than 50% carotid stenosis.   Persistent atrial fibrillation (HCC) 06/23/2016   Renal cyst    Sciatica    Stroke (HCC)    2010, RMCA with stent   Toe fracture 11/2015   R toe   Ulcer 1970s    Surgical History: Past Surgical History:  Procedure Laterality Date   BIOPSY  04/25/2018   Procedure: BIOPSY;  Surgeon: Becky Becky PENNER, MD;  [facility];  Service: Endoscopy;;  gastric polyps   COLONOSCOPY  03/10/2011   Procedure: COLONOSCOPY;  Surgeon: Becky Baird Golda, MD;  [facility];  Service: Endoscopy;  Laterality: N/A;   COLONOSCOPY N/A 08/24/2016   Procedure: COLONOSCOPY;  Surgeon: Becky Baird Golda, MD;  [facility]: [facility];  Service: Endoscopy;  Laterality: N/A;  930   DILATION AND CURETTAGE OF UTERUS     years ago   ESOPHAGOGASTRODUODENOSCOPY N/A 04/25/2018   Procedure: ESOPHAGOGASTRODUODENOSCOPY (EGD);  Surgeon: Becky Becky PENNER, MD;  [facility]: [facility];  Service: Endoscopy;  Laterality: N/A;  1:15   OVARIAN CYST REMOVAL     at least 10 years ago   POLYPECTOMY  08/24/2016   Procedure: POLYPECTOMY;  Surgeon: Becky Baird Golda, MD;  [facility]: [facility];  Service: Endoscopy;;   ascending colon; hepatic flexure   TONSILLECTOMY     childhood    Home Medications:  Allergies as of 06/05/2024       Reactions   Amlodipine     Severe pedal edema with 2.5 mg use   Gemtesa  [vibegron ] Itching   allover body itching and burning   Statins Other (See Comments)   Myalgias with rosuvastatin   Neurontin  [gabapentin ]    Cognitive issues, creates fogginess   Niaspan [niacin Er (antihyperlipidemic)] Rash   Oxybutynin    Nausea sweats        Medication List        Accurate as of June 05, 2024  1:46 PM. If you have any questions, ask your nurse or doctor.          ALAWAY OP Apply 1 drop to eye daily as needed.   ALPRAZolam  0.25 MG tablet Commonly known as: XANAX  TAKE ONE three times daily as needed   cephALEXin  250 MG capsule Commonly known as: KEFLEX  Take 1 capsule (250 mg total) by mouth daily. For long term use to prevent recurrent UTIs.   clobetasol ointment 0.05 % Commonly known as: TEMOVATE Apply 1 application topically 2 (two) times daily as needed.   cyanocobalamin 1000 MCG tablet Commonly known as: VITAMIN B12 Take 1 tablet (1,000 mcg total) by mouth daily.  estradiol 0.1 MG/GM vaginal cream Commonly known as: ESTRACE Place 1 Applicatorful vaginally once a week.   hydrochlorothiazide  25 MG tablet Commonly known as: HYDRODIURIL  Take 1 tablet by mouth once daily  stop amlodipine    loratadine  10 MG tablet Commonly known as: CLARITIN  Take 1 tablet (10 mg total) by mouth daily. What changed: when to take this   OVER THE COUNTER MEDICATION Take 1 capsule by mouth every morning. SUPREMA DOPHILUS   OVER THE COUNTER MEDICATION Take by mouth daily. Vitamin D3 2,000 units daily   pantoprazole  40 MG tablet Commonly known as: PROTONIX  Take 1 tablet (40 mg total) by mouth daily.   potassium chloride  10 MEQ tablet Commonly known as: KLOR-CON  M Take 1 tablet (10 mEq total) by mouth 2 (two) times daily.   potassium chloride  10 MEQ  tablet Commonly known as: KLOR-CON  take 1 tablet (10 meq total) by mouth 2 (two) times daily.   sodium chloride  0.65 % nasal spray Commonly known as: OCEAN Place 1 spray into the nose as needed for congestion.   sulfamethoxazole -trimethoprim  400-80 MG tablet Commonly known as: BACTRIM  Take 1 tablet by mouth 2 (two) times daily.   Xarelto  15 MG Tabs tablet Generic drug: Rivaroxaban  TAKE 1 TABLET BY MOUTH ONCE DAILY WITH  SUPPER        Allergies:  Allergies  Allergen Reactions   Amlodipine      Severe pedal edema with 2.5 mg use   Gemtesa  [Vibegron ] Itching    allover body itching and burning   Statins Other (See Comments)    Myalgias with rosuvastatin   Neurontin  [Gabapentin ]     Cognitive issues, creates fogginess   Niaspan [Niacin Er (Antihyperlipidemic)] Rash   Oxybutynin     Nausea sweats    Family History: Family History  Problem Relation Age of Onset   Stroke Mother    Heart failure Father    Diabetes Brother     Social History:  reports that she has never smoked. She has never used smokeless tobacco. She reports that she does not drink alcohol  and does not use drugs.  ROS: All other review of systems were reviewed and are negative except what is noted above in HPI  Physical Exam: BP (!) 146/50   Pulse 70   Constitutional:  Alert and oriented, No acute distress. HEENT: [place] AT, moist mucus membranes.  Trachea midline, no masses. Cardiovascular: No clubbing, cyanosis, or edema. Respiratory: Normal respiratory effort, no increased work of breathing. GI: Abdomen is soft, nontender, nondistended, no abdominal masses GU: No CVA tenderness.  Lymph: No cervical or inguinal lymphadenopathy. Skin: No rashes, bruises or suspicious lesions. Neurologic: Grossly intact, no focal deficits, moving all 4 extremities. Psychiatric: Normal mood and affect.  Laboratory Data: Lab Results  Component Value Date   WBC 7.4 05/11/2022   HGB 11.2 05/11/2022   HCT 32.2 (L)  05/11/2022   MCV 88 05/11/2022   PLT 344 05/11/2022    Lab Results  Component Value Date   CREATININE 1.54 (H) 12/14/2023    No results found for: PSA  No results found for: TESTOSTERONE  Lab Results  Component Value Date   HGBA1C 6.0 (H) 09/03/2021    Urinalysis    Component Value Date/Time   COLORURINE AMBER (A) 11/09/2019 2315   APPEARANCEUR Clear 01/09/2024 1357   LABSPEC 1.003 (L) 11/09/2019 2315   PHURINE 6.0 11/09/2019 2315   GLUCOSEU Negative 01/09/2024 1357   HGBUR SMALL (A) 11/09/2019 2315   BILIRUBINUR Negative 01/09/2024 1357  KETONESUR negative 04/12/2023 1157   KETONESUR NEGATIVE 11/09/2019 2315   PROTEINUR Negative 01/09/2024 1357   PROTEINUR NEGATIVE 11/09/2019 2315   UROBILINOGEN 0.2 04/12/2023 1157   UROBILINOGEN 0.2 03/05/2015 1513   NITRITE Negative 01/09/2024 1357   NITRITE POSITIVE (A) 11/09/2019 2315   LEUKOCYTESUR Negative 01/09/2024 1357   LEUKOCYTESUR SMALL (A) 11/09/2019 2315    Lab Results  Component Value Date   LABMICR Comment 01/09/2024   WBCUA >30 (A) 11/10/2023   LABEPIT 0-10 11/10/2023   BACTERIA Many (A) 11/10/2023    Pertinent Imaging:  No results found for this or any previous visit.  No results found for this or any previous visit.  No results found for this or any previous visit.  No results found for this or any previous visit.  Results for orders placed during the hospital encounter of 04/07/18  US  RENAL  Narrative CLINICAL DATA:  Lower abdominal pain, fever of unknown origin, UTI, renal cysts on CT  EXAM: RENAL / URINARY TRACT ULTRASOUND COMPLETE  COMPARISON:  CT abdomen/pelvis dated 04/07/2018  FINDINGS: Right Kidney:  Length: 10.2 cm. 5.3 x 7.1 x 5.7 cm upper pole simple cyst. 1.4 x 1.3 x 1.1 cm interpolar simple cyst. No hydronephrosis.  Left Kidney:  Length: 10.4 cm. 1.1 x 0.7 x 0.9 cm interpolar simple cyst. No hydronephrosis.  Bladder:  Bladder is within normal  limits.  IMPRESSION: Bilateral renal cysts, including a dominant 7.1 cm right upper pole simple cyst, benign.  No hydronephrosis.   Electronically Signed By: Pinkie Pebbles M.D. On: 04/08/2018 08:54  No results found for this or any previous visit.  No results found for this or any previous visit.  No results found for this or any previous visit.   Assessment & Plan:    1. Recurrent UTI (Primary) Continue keflex  250mg  daily - Urinalysis, Routine w reflex microscopic  2. OAB (overactive bladder) We will trial tropsium 60mg  daily due to cardiac history and history of cognitive impairment with other anticholinergics   No follow-ups on file.  Belvie Clara, MD  [facility] [place]

## 2024-06-06 ENCOUNTER — Encounter: Payer: Self-pay | Admitting: Urology

## 2024-06-06 NOTE — Patient Instructions (Signed)

## 2024-06-07 ENCOUNTER — Telehealth: Payer: Self-pay

## 2024-06-07 NOTE — Telephone Encounter (Signed)
 Return call to pt's daughter making her aware PA will need to be completed first. Daughter is made aware that there will be a message sent to MD, McKenzie documenting pt cardiac and cognitive comorbidity so pt's PA can be completed and approved. Pt's daughter voiced understanding

## 2024-06-07 NOTE — Telephone Encounter (Signed)
 Patient's daughter called checking on status of insurance denial for incontinence medication.  Checking on status of appeal.  Please advise.  Call:  [phone] - Verneita Flavors

## 2024-06-10 ENCOUNTER — Telehealth: Payer: Self-pay

## 2024-06-10 NOTE — Telephone Encounter (Signed)
 Medication prior authorization request received.  Completed PA request through cover my meds for drug Trospium Chloride. KEY: [id number]  Approved: Pending

## 2024-06-11 ENCOUNTER — Telehealth: Payer: Self-pay

## 2024-06-11 ENCOUNTER — Other Ambulatory Visit: Payer: Self-pay | Admitting: Family Medicine

## 2024-06-11 NOTE — Telephone Encounter (Signed)
 Patient daughter to make her aware of approval lvm to c/b for details

## 2024-06-11 NOTE — Telephone Encounter (Signed)
 Return call to pt's daughter making her aware PA was completed on 06/10/2024. PA pending review through insurance. Verneita voiced understanding.

## 2024-06-18 DIAGNOSIS — M79674 Pain in right toe(s): Secondary | ICD-10-CM | POA: Diagnosis not present

## 2024-06-18 DIAGNOSIS — L600 Ingrowing nail: Secondary | ICD-10-CM | POA: Diagnosis not present

## 2024-06-18 DIAGNOSIS — M79671 Pain in right foot: Secondary | ICD-10-CM | POA: Diagnosis not present

## 2024-07-07 DIAGNOSIS — L03115 Cellulitis of right lower limb: Secondary | ICD-10-CM | POA: Diagnosis not present

## 2024-07-11 ENCOUNTER — Other Ambulatory Visit: Payer: Self-pay | Admitting: Family Medicine

## 2024-08-05 ENCOUNTER — Other Ambulatory Visit: Payer: Self-pay | Admitting: Family Medicine

## 2024-08-06 ENCOUNTER — Ambulatory Visit (INDEPENDENT_AMBULATORY_CARE_PROVIDER_SITE_OTHER): Admitting: Family Medicine

## 2024-08-06 VITALS — BP 146/60 | HR 87 | Temp 98.1°F | Ht 64.0 in | Wt 104.4 lb

## 2024-08-06 DIAGNOSIS — M1711 Unilateral primary osteoarthritis, right knee: Secondary | ICD-10-CM | POA: Diagnosis not present

## 2024-08-06 DIAGNOSIS — R634 Abnormal weight loss: Secondary | ICD-10-CM | POA: Diagnosis not present

## 2024-08-06 DIAGNOSIS — Z79899 Other long term (current) drug therapy: Secondary | ICD-10-CM

## 2024-08-06 DIAGNOSIS — I480 Paroxysmal atrial fibrillation: Secondary | ICD-10-CM | POA: Diagnosis not present

## 2024-08-06 DIAGNOSIS — N1832 Chronic kidney disease, stage 3b: Secondary | ICD-10-CM

## 2024-08-06 DIAGNOSIS — D649 Anemia, unspecified: Secondary | ICD-10-CM

## 2024-08-06 MED ORDER — PANTOPRAZOLE SODIUM 40 MG PO TBEC: 40.0000 mg | DELAYED_RELEASE_TABLET | Freq: Every day | ORAL | 1 refills | Status: AC

## 2024-08-06 MED ORDER — RIVAROXABAN 15 MG PO TABS: 15.0000 mg | ORAL_TABLET | Freq: Every day | ORAL | 1 refills | Status: AC

## 2024-08-06 MED ORDER — ALPRAZOLAM 0.25 MG PO TABS: ORAL_TABLET | ORAL | 5 refills | Status: AC

## 2024-08-06 MED ORDER — POTASSIUM CHLORIDE ER 10 MEQ PO TBCR: 10.0000 meq | EXTENDED_RELEASE_TABLET | Freq: Two times a day (BID) | ORAL | 1 refills | Status: AC

## 2024-08-06 MED ORDER — HYDROCHLOROTHIAZIDE 25 MG PO TABS: ORAL_TABLET | ORAL | 1 refills | Status: AC

## 2024-08-06 NOTE — Progress Notes (Signed)
" ° °  Subjective:    Patient ID: Becky Baird, female    DOB: Sep 09, 1933, 89 y.o.   MRN: [id number]  HPI Patient is in room 10  Patient is here for discuss a spot on her right lower leg.  She relates she had an area that she thinks she injured it and then it got crusted and it just has not totally resolved is been going on for about 6 weeks now does not appear to be getting worse.  But just has excoriated area that bled and crusted over  Patient is having right knee and groin pain on the right lower extremeties-he relates pain in the right groin radiates to the right knee hurts with certain movements he denies any particular injuries, her family relates she did take a mild fall but no significant injury this happened a few months ago she did not sustain any injury that required x-rays  Weight loss-the patient just has subpar eating habits.  She only eats a bowl of cereal in the morning and at lunchtime sometimes he forgets to eat then at suppertime she will eat what ever her family brings her in she does drink a Ensure or boost but there are times she gets busy doing things and she just purely forgets that she needs to eat  Chronic anxiety-she does have anxiety we have tried numerous medicines.  She relates that she takes a Xanax  twice a day and sometimes 3 times a day denies abusing it states it does not cause drowsiness.   History of anemia-has a long history of normocytic anemia which is more than likely anemia of chronic disease denies any rectal bleeding  Denies any abdominal pains or discomfort Patient with atrial fibrillation takes her blood thinner denies any bleeding problems denies any chest pain or any significant shortness of breath  Chronic kidney disease been monitored relatively stable tries to eat and drink on a regular basis  Review of Systems     Objective:   Physical Exam General-in no acute distress Eyes-no discharge Lungs-respiratory rate normal, CTA CV-no  murmur Extremities skin warm dry no edema Neuro grossly normal Behavior normal, alert        Assessment & Plan:  1. CKD stage 3b, GFR 30-44 ml/min (HCC) (Primary) We will check lab work.  Continue current meds.  Healthy diet.  Try to fit into decent calorie intake and fluids - Basic metabolic panel with GFR  2. Primary osteoarthritis of right knee Tylenol  as needed I believe the groin pain and knee pain she has probably related arthritis of the hip and knee or could be an impingement of a nerve it does not appear to be bad enough to warrant advanced testing at the moment  3. Normochromic anemia Check lab work.  Healthy diet recommended - CBC with Differential/Platelet  4. High risk medication use Lab work ordered await results - Hepatic function panel  5. Paroxysmal atrial fibrillation (HCC) Continue blood thinner no sign of bleeding  6. Weight loss More than likely due to poor caloric intake we did discuss strategies of improving caloric intake.  We also discussed the importance of healthy eating and trying to get snacks in Family will monitor how she does with the reading over the next 3 months she will follow-up in 3 months if she is having significant setbacks before then we will see her sooner  In addition to this patient would benefit from further workup should her labs show significant change or problems  "

## 2024-08-07 LAB — BASIC METABOLIC PANEL WITH GFR
BUN/Creatinine Ratio: 21 (ref 12–28)
BUN: 35 mg/dL — ABNORMAL HIGH (ref 8–27)
CO2: 23 mmol/L (ref 20–29)
Calcium: 9.9 mg/dL (ref 8.7–10.3)
Chloride: 104 mmol/L (ref 96–106)
Creatinine, Ser: 1.63 mg/dL — ABNORMAL HIGH (ref 0.57–1.00)
Glucose: 96 mg/dL (ref 70–99)
Potassium: 4.8 mmol/L (ref 3.5–5.2)
Sodium: 143 mmol/L (ref 134–144)
eGFR: 30 mL/min/1.73 — ABNORMAL LOW

## 2024-08-07 LAB — HEPATIC FUNCTION PANEL
ALT: 14 IU/L (ref 0–32)
AST: 18 IU/L (ref 0–40)
Albumin: 4.7 g/dL (ref 3.7–4.7)
Alkaline Phosphatase: 83 IU/L (ref 48–129)
Bilirubin Total: 0.3 mg/dL (ref 0.0–1.2)
Bilirubin, Direct: 0.11 mg/dL (ref 0.00–0.40)
Total Protein: 7.4 g/dL (ref 6.0–8.5)

## 2024-08-07 LAB — CBC WITH DIFFERENTIAL/PLATELET
Basophils Absolute: 0.1 x10E3/uL (ref 0.0–0.2)
Basos: 1 %
EOS (ABSOLUTE): 0.1 x10E3/uL (ref 0.0–0.4)
Eos: 2 %
Hematocrit: 35 % (ref 34.0–46.6)
Hemoglobin: 11.6 g/dL (ref 11.1–15.9)
Immature Grans (Abs): 0 x10E3/uL (ref 0.0–0.1)
Immature Granulocytes: 0 %
Lymphocytes Absolute: 1.8 x10E3/uL (ref 0.7–3.1)
Lymphs: 30 %
MCH: 31.5 pg (ref 26.6–33.0)
MCHC: 33.1 g/dL (ref 31.5–35.7)
MCV: 95 fL (ref 79–97)
Monocytes Absolute: 0.6 x10E3/uL (ref 0.1–0.9)
Monocytes: 10 %
Neutrophils Absolute: 3.5 x10E3/uL (ref 1.4–7.0)
Neutrophils: 57 %
Platelets: 225 x10E3/uL (ref 150–450)
RBC: 3.68 x10E6/uL — ABNORMAL LOW (ref 3.77–5.28)
RDW: 12.8 % (ref 11.7–15.4)
WBC: 6.1 x10E3/uL (ref 3.4–10.8)

## 2024-08-08 ENCOUNTER — Ambulatory Visit: Payer: Self-pay | Admitting: Family Medicine

## 2024-09-30 ENCOUNTER — Ambulatory Visit: Admitting: Urology

## 2024-11-12 ENCOUNTER — Ambulatory Visit: Admitting: Family Medicine
